# Patient Record
Sex: Female | Born: 1950 | Race: White | Hispanic: No | Marital: Married | Smoking: Never smoker
Health system: Southern US, Community
[De-identification: ages and names within clinical notes are randomized; demographics above are authoritative.]

## PROBLEM LIST (undated history)

## (undated) DIAGNOSIS — T8859XA Other complications of anesthesia, initial encounter: Secondary | ICD-10-CM

## (undated) DIAGNOSIS — R059 Cough, unspecified: Secondary | ICD-10-CM

## (undated) DIAGNOSIS — K219 Gastro-esophageal reflux disease without esophagitis: Secondary | ICD-10-CM

## (undated) DIAGNOSIS — Z923 Personal history of irradiation: Secondary | ICD-10-CM

## (undated) DIAGNOSIS — M8580 Other specified disorders of bone density and structure, unspecified site: Secondary | ICD-10-CM

## (undated) DIAGNOSIS — Z801 Family history of malignant neoplasm of trachea, bronchus and lung: Secondary | ICD-10-CM

## (undated) DIAGNOSIS — D509 Iron deficiency anemia, unspecified: Secondary | ICD-10-CM

## (undated) DIAGNOSIS — M40204 Unspecified kyphosis, thoracic region: Secondary | ICD-10-CM

## (undated) DIAGNOSIS — M1990 Unspecified osteoarthritis, unspecified site: Secondary | ICD-10-CM

## (undated) DIAGNOSIS — T7840XA Allergy, unspecified, initial encounter: Secondary | ICD-10-CM

## (undated) DIAGNOSIS — C541 Malignant neoplasm of endometrium: Secondary | ICD-10-CM

## (undated) DIAGNOSIS — Z8719 Personal history of other diseases of the digestive system: Secondary | ICD-10-CM

## (undated) DIAGNOSIS — R42 Dizziness and giddiness: Secondary | ICD-10-CM

## (undated) DIAGNOSIS — E785 Hyperlipidemia, unspecified: Secondary | ICD-10-CM

## (undated) DIAGNOSIS — I341 Nonrheumatic mitral (valve) prolapse: Secondary | ICD-10-CM

## (undated) DIAGNOSIS — R05 Cough: Secondary | ICD-10-CM

## (undated) DIAGNOSIS — I609 Nontraumatic subarachnoid hemorrhage, unspecified: Secondary | ICD-10-CM

## (undated) DIAGNOSIS — R011 Cardiac murmur, unspecified: Secondary | ICD-10-CM

## (undated) DIAGNOSIS — R5382 Chronic fatigue, unspecified: Secondary | ICD-10-CM

## (undated) DIAGNOSIS — I671 Cerebral aneurysm, nonruptured: Secondary | ICD-10-CM

## (undated) DIAGNOSIS — C50919 Malignant neoplasm of unspecified site of unspecified female breast: Secondary | ICD-10-CM

## (undated) DIAGNOSIS — Z803 Family history of malignant neoplasm of breast: Secondary | ICD-10-CM

## (undated) DIAGNOSIS — J45909 Unspecified asthma, uncomplicated: Secondary | ICD-10-CM

## (undated) DIAGNOSIS — D1800 Hemangioma unspecified site: Secondary | ICD-10-CM

## (undated) DIAGNOSIS — K589 Irritable bowel syndrome without diarrhea: Secondary | ICD-10-CM

## (undated) HISTORY — DX: Hemangioma unspecified site: D18.00

## (undated) HISTORY — DX: Allergy, unspecified, initial encounter: T78.40XA

## (undated) HISTORY — DX: Cough, unspecified: R05.9

## (undated) HISTORY — DX: Unspecified kyphosis, thoracic region: M40.204

## (undated) HISTORY — DX: Irritable bowel syndrome, unspecified: K58.9

## (undated) HISTORY — DX: Chronic fatigue, unspecified: R53.82

## (undated) HISTORY — DX: Gastro-esophageal reflux disease without esophagitis: K21.9

## (undated) HISTORY — DX: Cough: R05

## (undated) HISTORY — DX: Other specified disorders of bone density and structure, unspecified site: M85.80

## (undated) HISTORY — DX: Family history of malignant neoplasm of breast: Z80.3

## (undated) HISTORY — DX: Dizziness and giddiness: R42

## (undated) HISTORY — DX: Cerebral aneurysm, nonruptured: I67.1

## (undated) HISTORY — DX: Family history of malignant neoplasm of trachea, bronchus and lung: Z80.1

## (undated) HISTORY — DX: Nonrheumatic mitral (valve) prolapse: I34.1

## (undated) HISTORY — PX: COLONOSCOPY: SHX174

## (undated) HISTORY — PX: UPPER GASTROINTESTINAL ENDOSCOPY: SHX188

## (undated) HISTORY — DX: Malignant neoplasm of unspecified site of unspecified female breast: C50.919

## (undated) HISTORY — DX: Hyperlipidemia, unspecified: E78.5

## (undated) HISTORY — PX: TONSILLECTOMY: SUR1361

## (undated) HISTORY — PX: FOOT SURGERY: SHX648

## (undated) HISTORY — DX: Iron deficiency anemia, unspecified: D50.9

## (undated) HISTORY — PX: MOUTH SURGERY: SHX715

## (undated) HISTORY — DX: Unspecified asthma, uncomplicated: J45.909

## (undated) HISTORY — PX: TUBAL LIGATION: SHX77

## (undated) HISTORY — PX: APPENDECTOMY: SHX54

---

## 1997-11-25 ENCOUNTER — Ambulatory Visit (HOSPITAL_COMMUNITY): Admission: RE | Admit: 1997-11-25 | Discharge: 1997-11-25 | Payer: Self-pay | Admitting: Obstetrics and Gynecology

## 1997-11-30 ENCOUNTER — Ambulatory Visit (HOSPITAL_COMMUNITY): Admission: RE | Admit: 1997-11-30 | Discharge: 1997-11-30 | Payer: Self-pay | Admitting: Obstetrics and Gynecology

## 1997-12-08 ENCOUNTER — Encounter: Payer: Self-pay | Admitting: Gastroenterology

## 1997-12-09 ENCOUNTER — Encounter: Payer: Self-pay | Admitting: Gastroenterology

## 1998-05-05 ENCOUNTER — Encounter: Payer: Self-pay | Admitting: Gastroenterology

## 1998-05-05 ENCOUNTER — Other Ambulatory Visit: Admission: RE | Admit: 1998-05-05 | Discharge: 1998-05-05 | Payer: Self-pay | Admitting: Gastroenterology

## 1998-06-15 ENCOUNTER — Ambulatory Visit (HOSPITAL_COMMUNITY): Admission: RE | Admit: 1998-06-15 | Discharge: 1998-06-15 | Payer: Self-pay | Admitting: Obstetrics and Gynecology

## 1998-06-15 ENCOUNTER — Encounter: Payer: Self-pay | Admitting: Obstetrics and Gynecology

## 1998-11-24 ENCOUNTER — Ambulatory Visit (HOSPITAL_COMMUNITY): Admission: RE | Admit: 1998-11-24 | Discharge: 1998-11-24 | Payer: Self-pay | Admitting: Obstetrics and Gynecology

## 1998-11-29 ENCOUNTER — Other Ambulatory Visit: Admission: RE | Admit: 1998-11-29 | Discharge: 1998-11-29 | Payer: Self-pay | Admitting: Obstetrics and Gynecology

## 2000-03-19 ENCOUNTER — Other Ambulatory Visit: Admission: RE | Admit: 2000-03-19 | Discharge: 2000-03-19 | Payer: Self-pay | Admitting: Obstetrics and Gynecology

## 2000-07-16 ENCOUNTER — Encounter: Admission: RE | Admit: 2000-07-16 | Discharge: 2000-07-16 | Payer: Self-pay | Admitting: Obstetrics and Gynecology

## 2000-07-16 ENCOUNTER — Encounter: Payer: Self-pay | Admitting: Obstetrics and Gynecology

## 2001-05-13 ENCOUNTER — Other Ambulatory Visit: Admission: RE | Admit: 2001-05-13 | Discharge: 2001-05-13 | Payer: Self-pay | Admitting: Obstetrics and Gynecology

## 2001-06-18 HISTORY — PX: OTHER SURGICAL HISTORY: SHX169

## 2001-11-08 ENCOUNTER — Inpatient Hospital Stay (HOSPITAL_COMMUNITY): Admission: EM | Admit: 2001-11-08 | Discharge: 2001-11-21 | Payer: Self-pay | Admitting: Neurosurgery

## 2001-11-08 ENCOUNTER — Encounter: Payer: Self-pay | Admitting: Neurosurgery

## 2001-11-08 ENCOUNTER — Encounter: Payer: Self-pay | Admitting: Emergency Medicine

## 2001-11-09 ENCOUNTER — Encounter: Payer: Self-pay | Admitting: Neurosurgery

## 2001-11-09 ENCOUNTER — Encounter: Payer: Self-pay | Admitting: Pulmonary Disease

## 2001-11-18 ENCOUNTER — Encounter: Payer: Self-pay | Admitting: Neurosurgery

## 2001-11-19 ENCOUNTER — Encounter: Payer: Self-pay | Admitting: Neurosurgery

## 2002-02-12 ENCOUNTER — Ambulatory Visit (HOSPITAL_COMMUNITY): Admission: RE | Admit: 2002-02-12 | Discharge: 2002-02-12 | Payer: Self-pay | Admitting: Obstetrics and Gynecology

## 2002-02-12 ENCOUNTER — Encounter: Payer: Self-pay | Admitting: Obstetrics and Gynecology

## 2002-03-04 ENCOUNTER — Ambulatory Visit (HOSPITAL_COMMUNITY): Admission: RE | Admit: 2002-03-04 | Discharge: 2002-03-04 | Payer: Self-pay | Admitting: Interventional Radiology

## 2002-06-02 ENCOUNTER — Ambulatory Visit (HOSPITAL_COMMUNITY): Admission: RE | Admit: 2002-06-02 | Discharge: 2002-06-02 | Payer: Self-pay | Admitting: Interventional Radiology

## 2002-11-19 ENCOUNTER — Ambulatory Visit (HOSPITAL_COMMUNITY): Admission: RE | Admit: 2002-11-19 | Discharge: 2002-11-19 | Payer: Self-pay | Admitting: Interventional Radiology

## 2003-02-17 ENCOUNTER — Ambulatory Visit (HOSPITAL_COMMUNITY): Admission: RE | Admit: 2003-02-17 | Discharge: 2003-02-17 | Payer: Self-pay | Admitting: Obstetrics and Gynecology

## 2003-02-17 ENCOUNTER — Encounter: Payer: Self-pay | Admitting: Obstetrics and Gynecology

## 2003-06-02 ENCOUNTER — Ambulatory Visit (HOSPITAL_COMMUNITY): Admission: RE | Admit: 2003-06-02 | Discharge: 2003-06-02 | Payer: Self-pay | Admitting: Interventional Radiology

## 2004-03-14 ENCOUNTER — Ambulatory Visit (HOSPITAL_COMMUNITY): Admission: RE | Admit: 2004-03-14 | Discharge: 2004-03-14 | Payer: Self-pay | Admitting: Obstetrics and Gynecology

## 2004-05-18 ENCOUNTER — Ambulatory Visit: Payer: Self-pay | Admitting: Internal Medicine

## 2004-06-05 ENCOUNTER — Ambulatory Visit (HOSPITAL_COMMUNITY): Admission: RE | Admit: 2004-06-05 | Discharge: 2004-06-05 | Payer: Self-pay | Admitting: Interventional Radiology

## 2004-06-08 ENCOUNTER — Ambulatory Visit: Payer: Self-pay | Admitting: Internal Medicine

## 2004-08-29 ENCOUNTER — Ambulatory Visit: Payer: Self-pay | Admitting: Internal Medicine

## 2004-11-29 ENCOUNTER — Ambulatory Visit: Payer: Self-pay | Admitting: Internal Medicine

## 2004-11-30 ENCOUNTER — Ambulatory Visit: Payer: Self-pay | Admitting: Internal Medicine

## 2005-01-12 ENCOUNTER — Ambulatory Visit: Payer: Self-pay | Admitting: Internal Medicine

## 2005-05-15 ENCOUNTER — Ambulatory Visit (HOSPITAL_COMMUNITY): Admission: RE | Admit: 2005-05-15 | Discharge: 2005-05-15 | Payer: Self-pay | Admitting: Obstetrics and Gynecology

## 2005-05-23 ENCOUNTER — Ambulatory Visit: Payer: Self-pay | Admitting: Internal Medicine

## 2005-06-18 HISTORY — PX: BREAST EXCISIONAL BIOPSY: SUR124

## 2005-06-21 ENCOUNTER — Encounter (INDEPENDENT_AMBULATORY_CARE_PROVIDER_SITE_OTHER): Payer: Self-pay | Admitting: *Deleted

## 2005-06-21 ENCOUNTER — Encounter: Admission: RE | Admit: 2005-06-21 | Discharge: 2005-06-21 | Payer: Self-pay | Admitting: Obstetrics and Gynecology

## 2005-06-21 HISTORY — PX: BREAST BIOPSY: SHX20

## 2005-07-06 ENCOUNTER — Ambulatory Visit: Payer: Self-pay

## 2005-07-10 ENCOUNTER — Ambulatory Visit (HOSPITAL_COMMUNITY): Admission: RE | Admit: 2005-07-10 | Discharge: 2005-07-10 | Payer: Self-pay | Admitting: Interventional Radiology

## 2005-11-28 ENCOUNTER — Ambulatory Visit: Payer: Self-pay | Admitting: Internal Medicine

## 2005-12-18 ENCOUNTER — Encounter: Admission: RE | Admit: 2005-12-18 | Discharge: 2005-12-18 | Payer: Self-pay | Admitting: Obstetrics and Gynecology

## 2006-01-01 ENCOUNTER — Ambulatory Visit: Payer: Self-pay | Admitting: Internal Medicine

## 2006-02-22 ENCOUNTER — Encounter: Admission: RE | Admit: 2006-02-22 | Discharge: 2006-02-22 | Payer: Self-pay | Admitting: General Surgery

## 2006-02-22 ENCOUNTER — Encounter (INDEPENDENT_AMBULATORY_CARE_PROVIDER_SITE_OTHER): Payer: Self-pay | Admitting: Specialist

## 2006-02-22 ENCOUNTER — Ambulatory Visit (HOSPITAL_BASED_OUTPATIENT_CLINIC_OR_DEPARTMENT_OTHER): Admission: RE | Admit: 2006-02-22 | Discharge: 2006-02-22 | Payer: Self-pay | Admitting: General Surgery

## 2006-03-12 ENCOUNTER — Encounter: Payer: Self-pay | Admitting: Gastroenterology

## 2006-05-20 ENCOUNTER — Ambulatory Visit (HOSPITAL_COMMUNITY): Admission: RE | Admit: 2006-05-20 | Discharge: 2006-05-20 | Payer: Self-pay | Admitting: Obstetrics and Gynecology

## 2006-05-24 ENCOUNTER — Ambulatory Visit: Payer: Self-pay | Admitting: Internal Medicine

## 2006-05-24 LAB — CONVERTED CEMR LAB
ALT: 16 units/L (ref 0–40)
AST: 20 units/L (ref 0–37)
Albumin: 3.9 g/dL (ref 3.5–5.2)
Alkaline Phosphatase: 60 units/L (ref 39–117)
BUN: 11 mg/dL (ref 6–23)
Basophils Absolute: 0 10*3/uL (ref 0.0–0.1)
Basophils Relative: 0.8 % (ref 0.0–1.0)
Bilirubin Urine: NEGATIVE
CO2: 31 meq/L (ref 19–32)
Calcium: 9.2 mg/dL (ref 8.4–10.5)
Chloride: 103 meq/L (ref 96–112)
Chol/HDL Ratio, serum: 5.4
Cholesterol: 216 mg/dL (ref 0–200)
Creatinine, Ser: 0.8 mg/dL (ref 0.4–1.2)
Eosinophil percent: 1.3 % (ref 0.0–5.0)
GFR calc non Af Amer: 79 mL/min
Glomerular Filtration Rate, Af Am: 95 mL/min/{1.73_m2}
Glucose, Bld: 106 mg/dL — ABNORMAL HIGH (ref 70–99)
HCT: 37.8 % (ref 36.0–46.0)
HDL: 39.8 mg/dL (ref >39.0)
Hemoglobin, Urine: NEGATIVE
Hemoglobin: 13 g/dL (ref 12.0–15.0)
Ketones, ur: NEGATIVE mg/dL
LDL DIRECT: 167.1 mg/dL
Leukocytes, UA: NEGATIVE
Lymphocytes Relative: 27.8 % (ref 12.0–46.0)
MCHC: 34.4 g/dL (ref 30.0–36.0)
MCV: 85.6 fL (ref 78.0–100.0)
Monocytes Absolute: 0.6 10*3/uL (ref 0.2–0.7)
Monocytes Relative: 10.3 % (ref 3.0–11.0)
Neutro Abs: 3.2 10*3/uL (ref 1.4–7.7)
Neutrophils Relative %: 59.8 % (ref 43.0–77.0)
Nitrite: NEGATIVE
Platelets: 192 10*3/uL (ref 150–400)
Potassium: 3.7 meq/L (ref 3.5–5.1)
RBC: 4.41 M/uL (ref 3.87–5.11)
RDW: 12.6 % (ref 11.5–14.6)
Sodium: 141 meq/L (ref 135–145)
Specific Gravity, Urine: 1.025 (ref 1.000–1.03)
TSH: 1.47 microintl units/mL (ref 0.35–5.50)
Total Bilirubin: 1 mg/dL (ref 0.3–1.2)
Total Protein, Urine: NEGATIVE mg/dL
Total Protein: 6.8 g/dL (ref 6.0–8.3)
Triglyceride fasting, serum: 113 mg/dL (ref 0–149)
Urine Glucose: NEGATIVE mg/dL
Urobilinogen, UA: 0.2 (ref 0.0–1.0)
VLDL: 23 mg/dL (ref 0–40)
WBC: 5.4 10*3/uL (ref 4.5–10.5)
pH: 6 (ref 5.0–8.0)

## 2006-06-26 ENCOUNTER — Ambulatory Visit: Payer: Self-pay | Admitting: Internal Medicine

## 2006-07-17 ENCOUNTER — Ambulatory Visit (HOSPITAL_COMMUNITY): Admission: RE | Admit: 2006-07-17 | Discharge: 2006-07-17 | Payer: Self-pay | Admitting: Interventional Radiology

## 2007-05-02 DIAGNOSIS — K219 Gastro-esophageal reflux disease without esophagitis: Secondary | ICD-10-CM | POA: Insufficient documentation

## 2007-05-02 DIAGNOSIS — R5383 Other fatigue: Secondary | ICD-10-CM

## 2007-05-02 DIAGNOSIS — R05 Cough: Secondary | ICD-10-CM

## 2007-05-02 DIAGNOSIS — I609 Nontraumatic subarachnoid hemorrhage, unspecified: Secondary | ICD-10-CM | POA: Insufficient documentation

## 2007-05-02 DIAGNOSIS — M4120 Other idiopathic scoliosis, site unspecified: Secondary | ICD-10-CM | POA: Insufficient documentation

## 2007-05-02 DIAGNOSIS — R5381 Other malaise: Secondary | ICD-10-CM

## 2007-05-02 DIAGNOSIS — E785 Hyperlipidemia, unspecified: Secondary | ICD-10-CM | POA: Insufficient documentation

## 2007-05-02 DIAGNOSIS — M810 Age-related osteoporosis without current pathological fracture: Secondary | ICD-10-CM | POA: Insufficient documentation

## 2007-05-02 DIAGNOSIS — D1800 Hemangioma unspecified site: Secondary | ICD-10-CM | POA: Insufficient documentation

## 2007-05-02 DIAGNOSIS — K589 Irritable bowel syndrome without diarrhea: Secondary | ICD-10-CM | POA: Insufficient documentation

## 2007-05-22 ENCOUNTER — Encounter: Admission: RE | Admit: 2007-05-22 | Discharge: 2007-05-22 | Payer: Self-pay | Admitting: Obstetrics and Gynecology

## 2007-05-28 ENCOUNTER — Ambulatory Visit: Payer: Self-pay | Admitting: Internal Medicine

## 2007-05-30 DIAGNOSIS — R42 Dizziness and giddiness: Secondary | ICD-10-CM

## 2007-05-30 LAB — CONVERTED CEMR LAB
ALT: 17 units/L (ref 0–35)
AST: 20 units/L (ref 0–37)
Albumin: 3.9 g/dL (ref 3.5–5.2)
Alkaline Phosphatase: 51 units/L (ref 39–117)
BUN: 10 mg/dL (ref 6–23)
Bilirubin, Direct: 0.2 mg/dL (ref 0.0–0.3)
CO2: 32 meq/L (ref 19–32)
Calcium: 9.4 mg/dL (ref 8.4–10.5)
Chloride: 103 meq/L (ref 96–112)
Cholesterol: 204 mg/dL (ref 0–200)
Creatinine, Ser: 0.6 mg/dL (ref 0.4–1.2)
Direct LDL: 145.7 mg/dL
GFR calc Af Amer: 133 mL/min
GFR calc non Af Amer: 110 mL/min
Glucose, Bld: 95 mg/dL (ref 70–99)
HDL: 37 mg/dL — ABNORMAL LOW (ref >39.0)
Potassium: 3.8 meq/L (ref 3.5–5.1)
Sodium: 142 meq/L (ref 135–145)
TSH: 1.43 microintl units/mL (ref 0.35–5.50)
Total Bilirubin: 1.3 mg/dL — ABNORMAL HIGH (ref 0.3–1.2)
Total CHOL/HDL Ratio: 5.5
Total Protein: 6.8 g/dL (ref 6.0–8.3)
Triglycerides: 142 mg/dL (ref 0–149)
VLDL: 28 mg/dL (ref 0–40)

## 2007-06-03 LAB — CONVERTED CEMR LAB: Vit D, 1,25-Dihydroxy: 20 — ABNORMAL LOW (ref 30–89)

## 2007-07-09 ENCOUNTER — Ambulatory Visit: Payer: Self-pay | Admitting: Internal Medicine

## 2007-07-30 ENCOUNTER — Telehealth: Payer: Self-pay | Admitting: Internal Medicine

## 2007-08-25 ENCOUNTER — Encounter: Admission: RE | Admit: 2007-08-25 | Discharge: 2007-10-14 | Payer: Self-pay | Admitting: Internal Medicine

## 2007-08-25 ENCOUNTER — Encounter: Payer: Self-pay | Admitting: Internal Medicine

## 2008-05-27 ENCOUNTER — Encounter: Admission: RE | Admit: 2008-05-27 | Discharge: 2008-05-27 | Payer: Self-pay | Admitting: Obstetrics and Gynecology

## 2008-06-07 ENCOUNTER — Ambulatory Visit: Payer: Self-pay | Admitting: Internal Medicine

## 2008-06-08 LAB — CONVERTED CEMR LAB
ALT: 21 units/L (ref 0–35)
AST: 26 units/L (ref 0–37)
Albumin: 3.9 g/dL (ref 3.5–5.2)
Alkaline Phosphatase: 71 units/L (ref 39–117)
BUN: 11 mg/dL (ref 6–23)
Basophils Absolute: 0 10*3/uL (ref 0.0–0.1)
Basophils Relative: 0.5 % (ref 0.0–3.0)
Bilirubin, Direct: 0.2 mg/dL (ref 0.0–0.3)
CO2: 33 meq/L — ABNORMAL HIGH (ref 19–32)
CRP, High Sensitivity: 2 (ref 0.00–5.00)
Calcium: 9.3 mg/dL (ref 8.4–10.5)
Chloride: 103 meq/L (ref 96–112)
Cholesterol: 211 mg/dL (ref 0–200)
Creatinine, Ser: 0.7 mg/dL (ref 0.4–1.2)
Direct LDL: 146.5 mg/dL
Eosinophils Absolute: 0.1 10*3/uL (ref 0.0–0.7)
Eosinophils Relative: 1.3 % (ref 0.0–5.0)
GFR calc Af Amer: 111 mL/min
GFR calc non Af Amer: 91 mL/min
Glucose, Bld: 102 mg/dL — ABNORMAL HIGH (ref 70–99)
HCT: 35.1 % — ABNORMAL LOW (ref 36.0–46.0)
HDL: 40.8 mg/dL (ref >39.0)
Hemoglobin: 12.1 g/dL (ref 12.0–15.0)
Lymphocytes Relative: 26.5 % (ref 12.0–46.0)
MCHC: 34.3 g/dL (ref 30.0–36.0)
MCV: 83.9 fL (ref 78.0–100.0)
Monocytes Absolute: 0.5 10*3/uL (ref 0.1–1.0)
Monocytes Relative: 10.3 % (ref 3.0–12.0)
Neutro Abs: 3 10*3/uL (ref 1.4–7.7)
Neutrophils Relative %: 61.4 % (ref 43.0–77.0)
Platelets: 174 10*3/uL (ref 150–400)
Potassium: 3.8 meq/L (ref 3.5–5.1)
RBC: 4.19 M/uL (ref 3.87–5.11)
RDW: 12.4 % (ref 11.5–14.6)
Sodium: 140 meq/L (ref 135–145)
TSH: 1.47 microintl units/mL (ref 0.35–5.50)
Total Bilirubin: 1.1 mg/dL (ref 0.3–1.2)
Total CHOL/HDL Ratio: 5.2
Total Protein: 6.6 g/dL (ref 6.0–8.3)
Triglycerides: 127 mg/dL (ref 0–149)
VLDL: 25 mg/dL (ref 0–40)
Vit D, 1,25-Dihydroxy: 30 (ref 30–89)
WBC: 4.9 10*3/uL (ref 4.5–10.5)

## 2008-06-24 ENCOUNTER — Telehealth (INDEPENDENT_AMBULATORY_CARE_PROVIDER_SITE_OTHER): Payer: Self-pay | Admitting: *Deleted

## 2008-07-12 ENCOUNTER — Ambulatory Visit: Payer: Self-pay | Admitting: Internal Medicine

## 2008-07-12 ENCOUNTER — Encounter: Payer: Self-pay | Admitting: Internal Medicine

## 2008-07-14 ENCOUNTER — Telehealth (INDEPENDENT_AMBULATORY_CARE_PROVIDER_SITE_OTHER): Payer: Self-pay | Admitting: *Deleted

## 2008-08-23 ENCOUNTER — Ambulatory Visit: Payer: Self-pay | Admitting: Internal Medicine

## 2008-09-20 ENCOUNTER — Ambulatory Visit: Payer: Self-pay | Admitting: Gastroenterology

## 2008-10-08 ENCOUNTER — Ambulatory Visit: Payer: Self-pay | Admitting: Gastroenterology

## 2008-12-24 ENCOUNTER — Encounter: Payer: Self-pay | Admitting: Internal Medicine

## 2008-12-27 ENCOUNTER — Encounter: Payer: Self-pay | Admitting: Gastroenterology

## 2009-05-30 ENCOUNTER — Encounter: Admission: RE | Admit: 2009-05-30 | Discharge: 2009-05-30 | Payer: Self-pay | Admitting: Obstetrics and Gynecology

## 2009-06-07 ENCOUNTER — Encounter: Admission: RE | Admit: 2009-06-07 | Discharge: 2009-06-07 | Payer: Self-pay | Admitting: Obstetrics and Gynecology

## 2009-06-24 ENCOUNTER — Ambulatory Visit: Payer: Self-pay | Admitting: Internal Medicine

## 2009-06-24 DIAGNOSIS — I491 Atrial premature depolarization: Secondary | ICD-10-CM | POA: Insufficient documentation

## 2009-06-25 LAB — CONVERTED CEMR LAB
Albumin: 3.9 g/dL (ref 3.5–5.2)
Alkaline Phosphatase: 64 units/L (ref 39–117)
BUN: 10 mg/dL (ref 6–23)
Basophils Absolute: 0 10*3/uL (ref 0.0–0.1)
Bilirubin, Direct: 0.1 mg/dL (ref 0.0–0.3)
CO2: 30 meq/L (ref 19–32)
Calcium: 9.5 mg/dL (ref 8.4–10.5)
Cholesterol: 212 mg/dL — ABNORMAL HIGH (ref 0–200)
Creatinine, Ser: 0.7 mg/dL (ref 0.4–1.2)
Direct LDL: 147.4 mg/dL
Eosinophils Absolute: 0.1 10*3/uL (ref 0.0–0.7)
Glucose, Bld: 94 mg/dL (ref 70–99)
Hemoglobin, Urine: NEGATIVE
Lymphocytes Relative: 25.5 % (ref 12.0–46.0)
MCHC: 32.7 g/dL (ref 30.0–36.0)
Neutro Abs: 3.1 10*3/uL (ref 1.4–7.7)
Neutrophils Relative %: 61.1 % (ref 43.0–77.0)
Nitrite: NEGATIVE
Platelets: 179 10*3/uL (ref 150.0–400.0)
RDW: 12.9 % (ref 11.5–14.6)
Total Protein, Urine: NEGATIVE mg/dL
Triglycerides: 189 mg/dL — ABNORMAL HIGH (ref 0.0–149.0)

## 2009-07-07 ENCOUNTER — Ambulatory Visit: Payer: Self-pay

## 2009-07-07 ENCOUNTER — Encounter: Payer: Self-pay | Admitting: Cardiovascular Disease

## 2009-08-08 ENCOUNTER — Telehealth: Payer: Self-pay | Admitting: Internal Medicine

## 2009-08-08 ENCOUNTER — Ambulatory Visit: Payer: Self-pay | Admitting: Internal Medicine

## 2009-08-09 ENCOUNTER — Telehealth: Payer: Self-pay | Admitting: Gastroenterology

## 2009-08-10 ENCOUNTER — Encounter: Payer: Self-pay | Admitting: Internal Medicine

## 2009-10-26 ENCOUNTER — Ambulatory Visit: Payer: Self-pay | Admitting: Internal Medicine

## 2009-10-28 ENCOUNTER — Telehealth: Payer: Self-pay | Admitting: Internal Medicine

## 2009-10-28 LAB — CONVERTED CEMR LAB
Bilirubin Urine: NEGATIVE
Hemoglobin, Urine: NEGATIVE
Ketones, ur: NEGATIVE mg/dL
Urine Glucose: NEGATIVE mg/dL
Urobilinogen, UA: 0.2 (ref 0.0–1.0)

## 2009-11-17 ENCOUNTER — Encounter: Admission: RE | Admit: 2009-11-17 | Discharge: 2009-11-17 | Payer: Self-pay | Admitting: Obstetrics and Gynecology

## 2009-11-29 ENCOUNTER — Encounter: Payer: Self-pay | Admitting: Internal Medicine

## 2010-05-31 ENCOUNTER — Encounter
Admission: RE | Admit: 2010-05-31 | Discharge: 2010-05-31 | Payer: Self-pay | Source: Home / Self Care | Attending: Obstetrics and Gynecology | Admitting: Obstetrics and Gynecology

## 2010-06-22 ENCOUNTER — Encounter: Payer: Self-pay | Admitting: Internal Medicine

## 2010-07-04 ENCOUNTER — Encounter: Payer: Self-pay | Admitting: Internal Medicine

## 2010-07-04 ENCOUNTER — Other Ambulatory Visit: Payer: Self-pay | Admitting: Internal Medicine

## 2010-07-04 ENCOUNTER — Ambulatory Visit
Admission: RE | Admit: 2010-07-04 | Discharge: 2010-07-04 | Payer: Self-pay | Source: Home / Self Care | Attending: Internal Medicine | Admitting: Internal Medicine

## 2010-07-04 LAB — LIPID PANEL
Cholesterol: 202 mg/dL — ABNORMAL HIGH (ref 0–200)
HDL: 41.7 mg/dL (ref >39.00)
Total CHOL/HDL Ratio: 5
Triglycerides: 99 mg/dL (ref 0.0–149.0)
VLDL: 19.8 mg/dL (ref 0.0–40.0)

## 2010-07-04 LAB — BASIC METABOLIC PANEL
BUN: 19 mg/dL (ref 6–23)
CO2: 32 mEq/L (ref 19–32)
Calcium: 9.3 mg/dL (ref 8.4–10.5)
Chloride: 102 mEq/L (ref 96–112)
Creatinine, Ser: 0.7 mg/dL (ref 0.4–1.2)
GFR: 93.67 mL/min (ref >60.00)
Glucose, Bld: 99 mg/dL (ref 70–99)
Potassium: 4.3 mEq/L (ref 3.5–5.1)
Sodium: 141 mEq/L (ref 135–145)

## 2010-07-04 LAB — CBC WITH DIFFERENTIAL/PLATELET
Basophils Absolute: 0 10*3/uL (ref 0.0–0.1)
Basophils Relative: 0.6 % (ref 0.0–3.0)
Eosinophils Absolute: 0.1 10*3/uL (ref 0.0–0.7)
Eosinophils Relative: 1.9 % (ref 0.0–5.0)
HCT: 33.7 % — ABNORMAL LOW (ref 36.0–46.0)
Hemoglobin: 11.5 g/dL — ABNORMAL LOW (ref 12.0–15.0)
Lymphocytes Relative: 23 % (ref 12.0–46.0)
Lymphs Abs: 1.3 10*3/uL (ref 0.7–4.0)
MCHC: 34.1 g/dL (ref 30.0–36.0)
MCV: 83.5 fl (ref 78.0–100.0)
Monocytes Absolute: 0.5 10*3/uL (ref 0.1–1.0)
Monocytes Relative: 9.1 % (ref 3.0–12.0)
Neutro Abs: 3.8 10*3/uL (ref 1.4–7.7)
Neutrophils Relative %: 65.4 % (ref 43.0–77.0)
Platelets: 182 10*3/uL (ref 150.0–400.0)
RBC: 4.04 Mil/uL (ref 3.87–5.11)
RDW: 14.3 % (ref 11.5–14.6)
WBC: 5.9 10*3/uL (ref 4.5–10.5)

## 2010-07-04 LAB — RENAL FUNCTION PANEL
Albumin: 4 g/dL (ref 3.5–5.2)
BUN: 19 mg/dL (ref 6–23)
CO2: 32 mEq/L (ref 19–32)
Calcium: 9.3 mg/dL (ref 8.4–10.5)
Chloride: 102 mEq/L (ref 96–112)
Creatinine, Ser: 0.7 mg/dL (ref 0.4–1.2)
GFR: 93.67 mL/min (ref >60.00)
Glucose, Bld: 99 mg/dL (ref 70–99)
Phosphorus: 3.9 mg/dL (ref 2.3–4.6)
Potassium: 4.3 mEq/L (ref 3.5–5.1)
Sodium: 141 mEq/L (ref 135–145)

## 2010-07-04 LAB — URINALYSIS, ROUTINE W REFLEX MICROSCOPIC
Bilirubin Urine: NEGATIVE
Hemoglobin, Urine: NEGATIVE
Ketones, ur: NEGATIVE
Nitrite: NEGATIVE
Specific Gravity, Urine: 1.015 (ref 1.000–1.030)
Total Protein, Urine: NEGATIVE
Urine Glucose: NEGATIVE
Urobilinogen, UA: 0.2 (ref 0.0–1.0)
pH: 7 (ref 5.0–8.0)

## 2010-07-04 LAB — HEPATIC FUNCTION PANEL
ALT: 20 U/L (ref 0–35)
AST: 23 U/L (ref 0–37)
Albumin: 4 g/dL (ref 3.5–5.2)
Alkaline Phosphatase: 66 U/L (ref 39–117)
Bilirubin, Direct: 0.1 mg/dL (ref 0.0–0.3)
Total Bilirubin: 0.5 mg/dL (ref 0.3–1.2)
Total Protein: 6.7 g/dL (ref 6.0–8.3)

## 2010-07-04 LAB — HIGH SENSITIVITY CRP: CRP, High Sensitivity: 0.99 mg/L (ref 0.00–5.00)

## 2010-07-04 LAB — LDL CHOLESTEROL, DIRECT: Direct LDL: 151.4 mg/dL

## 2010-07-04 LAB — TSH: TSH: 2.08 u[IU]/mL (ref 0.35–5.50)

## 2010-07-09 ENCOUNTER — Encounter: Payer: Self-pay | Admitting: Obstetrics and Gynecology

## 2010-07-20 NOTE — Assessment & Plan Note (Signed)
Summary: Primary svc/ f/u ov   Copy to:  Alexis Lynn, MD Primary Provider/Referring Provider:  Sandrea Hughs, MD  CC:  6 wk followup.  Pt states no complaints today.Alexis [name]  History of Present Illness: 60  yowf  history of esophageal stricture requiring dilatation now complaining of chronic sensation of drainage in the back of her throat the chokes or when she lies down at night.  June 07, 2008 ov stating throat  worse x 5-6 months with mild dysphagia lower neck area no wt loss or overt hb symtoms. rec two times a day ppi and diet  August 23, 2008 ov not able to do  ppi two times a day and still with same dysphagia, noct hb especially severe on occasion, maybe twice per week, depending on what/ how late she eats. rec GI eval pos for HH rec Dexilant .    June 24, 2009 mulitple c/o's  3-4 months palpitations, occ with ex, no ex cp.  Freq throat irriation 2-3 years sometimes bothers her at night.   rec trial of pepcid 20 mg at bedtime and chlortrimeton and better  August 08, 2009 ov better pnds on present rx. no noct or am disturbance.  Pt denies any significant sore throat, dysphagia, itching, sneezing,  nasal congestion or excess secretions,  fever, chills, sweats, unintended wt loss, pleuritic or exertional cp, hempoptysis, change in activity tolerance  orthopnea pnd or leg swelling   Current Medications (verified): 1)  Pravachol 40 Mg  Tabs (Pravastatin Sodium) .... Take One Tab By Mouth At Bedtime 2)  Baby Aspirin 81 Mg Chew (Aspirin) .Alexis [name].. 1 Once Daily 3)  Kapidex 60 Mg Cpdr (Dexlansoprazole) .... One Tablet By Mouth Once Daily 4)  Pepcid Ac Maximum Strength 20 Mg Tabs (Famotidine) .... One At Bedtime 5)  Chlor-Trimeton 4 Mg Tabs (Chlorpheniramine Maleate) .... One At Bedtime 6)  Vitamin D 1000 Unit Tabs (Cholecalciferol) .Alexis [name].. 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) FATIGUE, CHRONIC (ICD-780.79) 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     - ABD [place] 11/30/04 HYPERLIPIDEMIA (ICD-272.4)  - Target < 160, single risk factor = pos fm hx COUGH (ICD-786.2) HEALTH MAINTENANCE...........................................................Alexis KitchenWert    - DT 6/07    - CPX June 24, 2009     - GYN care [name] Fe deficiency anemia, ferritin=4, 1999  Vital Signs:  Patient profile:   60 year old female Weight:      140 pounds O2 Sat:      97 % on Room air Temp:     98.5 degrees F oral Pulse rate:   57 / minute BP sitting:   126 / 74  (left arm)  Vitals Entered By: Vernie Murders (August 08, 2009 9:27 AM)  O2 Flow:  Room air  Physical Exam  Additional Exam:   Ambulatory healthy appearing in no acute distress. Afeb with normal vital signs  wt 140 > 135  06/07/08 > 139 07/26/08 > 138 June 24, 2009 >  140 August 09, 2009  HEENT: nl dentition, turbinates, and orophanx Neck without JVD/Nodes/TM Lungs clear to A and P bilaterally without cough on insp or exp maneuvers RRR no s3 or murmur or increase in P2 Abd soft and benign with nl excursion in the supine position. No bruits or organomegaly Ext warm without calf tenderness, cyanosis clubbing or edema     Impression & Recommendations:  Problem # 1:  COUGH (ICD-786.2) Assessment New  Upper airway cough syndrome, so named because it's frequently impossible to sort out how much is LPR vs CR/sinusitis with freq throat clearing generating secondary extra  esophageal GERD from wide swings in gastric pressure that occur with throat clearing, promoting self use of mint and menthol lozenges that reduce the lower esophageal sphincter tone and exacerbate the problem further.  These symptoms are easily confused with asthma/copd by even experienced pulmonogists because they overlap so much. These are the same pts who not infrequently have failed to tolerate ace inhibitors,  dry powder inhalers or biphosphonates or report having reflux symptoms that don't respond to standard doses of PPI  try reduce H1 and see if flares, if so consider allergy eval  Orders: Est. Patient Level III ([address]109)  Problem # 2:  HYPERLIPIDEMIA (ICD-272.4)  Her updated medication list for this problem includes:    Pravachol 40 Mg Tabs (Pravastatin sodium) .Alexis [name]... Take one tab by mouth at bedtime  Labs Reviewed: SGOT: 21 (06/24/2009)   SGPT: 22 (06/24/2009)   HDL:40.80 (06/24/2009), 40.8 (06/07/2008)  LDL:DEL (06/07/2008), DEL (05/28/2007)  Chol:212 (06/24/2009), 211 (06/07/2008)  Trig:189.0 (06/24/2009), 127 (06/07/2008)  Orders: Est. Patient Level III (60454)  Problem # 3:  OSTEOPENIA (ICD-733.90) Dexa's reviewed, ok to restart Calcium  See instructions for specific recommendations   Problem # 4:  GERD (ICD-530.81)  The following medications were removed from the medication list:    Kapidex 60 Mg Cpdr (Dexlansoprazole) ..... One tablet by mouth once daily Her updated medication list for this problem includes:    Pepcid Ac Maximum Strength 20 Mg Tabs (Famotidine) ..... One at bedtime    Protonix 40 Mg Tbec (Pantoprazole sodium) .Alexis [name]... Take 1 tablet by mouth every morning 30 minutes before breakfast meal.  F/u per Dr Russella Dar  Orders: Est. Patient Level III (09811)  Medications Added to Medication List This Visit: 1)  Vitamin D 1000 Unit Tabs (Cholecalciferol) .Alexis [name].. 1 once daily 2)  Dexilant 60 Mg Cpdr (Dexlansoprazole) .... Take  one 30-60 min before first meal of the  day  Patient Instructions: 1)  After 6 more weeks of chlortrimeton 4mg  at bedtime ok to take as needed  2)  Start back on TUMS ES after meals and if reflux or GI issues would recommend calcium citrate about 1500 mg per day 3)  Return to office in 3 months, sooner if needed  Prescriptions: DEXILANT 60 MG CPDR (DEXLANSOPRAZOLE) Take  one 30-60 min before first meal of the day  #34 x 11   Entered and Authorized by:   Nyoka Cowden MD   Signed by:   Nyoka Cowden MD on 08/08/2009   Method used:   [facility]        [facility] [phone]* (retail)       [address]       [place], [place]  [address]       Ph: [phone] or [phone]       Fax: [phone]   RxID:  [id number] PRAVACHOL 40 MG  TABS (PRAVASTATIN SODIUM) take one tab by mouth at bedtime  #34 x 90   Entered and Authorized by:   Nyoka Cowden MD   Signed by:   Nyoka Cowden MD on 08/08/2009   Method used:   [facility] to        [facility] [phone][facility] (retail)       [address]       [place], [place]  [address]       Ph: [phone] or [phone]       Fax: [phone]   RxID:   [id number]

## 2010-07-20 NOTE — Progress Notes (Signed)
Summary: RETURNING CALL TO [facility]  Phone Note Call from Patient Call back at Work Phone [phone]   Caller: Patient Call For: Zymeir Salminen Summary of Call: RETURNING CALL TO [facility] Initial call taken by: Lacinda Axon,  Oct 28, 2009 8:58 AM  Follow-up for Phone Call        pt advsied per append.  Follow-up by: Carron Curie CMA,  Oct 28, 2009 9:01 AM

## 2010-07-20 NOTE — Progress Notes (Signed)
Summary: Change PPI  Phone Note Outgoing Call   Call placed by: Hortense Ramal CMA Duncan Dull),  August 09, 2009 2:07 PM Call placed to: Patient Summary of Call: I have spoken to the patient. We got a prior authorization request from patient's pharmacy for her Dexilant. I spoke to her insurance company who will not cover the prescription any longer until she has tried zegerid, omeprazole, protonix, lansoprazole and nexium. Patient has tried nexium but none of the other medications. Patient would like a new prescription of protonix sent to [facility]. We will send that prescription and will [facility]. Initial call taken by: Hortense Ramal CMA Duncan Dull),  August 09, 2009 2:10 PM    New/Updated Medications: PROTONIX 40 MG TBEC (PANTOPRAZOLE SODIUM) Take 1 tablet by mouth every morning 30 minutes before breakfast meal. Prescriptions: PROTONIX 40 MG TBEC (PANTOPRAZOLE SODIUM) Take 1 tablet by mouth every morning 30 minutes before breakfast meal.  #90 x 0   Entered by:   Hortense Ramal CMA (AAMA)   Authorized by:   Meryl Dare MD [facility]   Signed by:   Hortense Ramal CMA (AAMA) on 08/09/2009   Method used:   Electronically to        [facility]* (mail-order)             ,          Ph: [phone]       Fax: [phone]   RxID:   [id number]

## 2010-07-20 NOTE — Letter (Signed)
Summary: Screening/[facility]  Screening/[employer]   Imported By: Lester [place] 12/15/2009 07:39:44  _____________________________________________________________________  External Attachment:    Type:   Image     Comment:   External Document

## 2010-07-20 NOTE — Progress Notes (Signed)
Summary: change pharmacy  Phone Note Call from Patient Call back at Home Phone [phone]   Caller: Patient Call For: Koriana Stepien Summary of Call: pt needs rx's called in to [facility].( note: pt was seen today).  Initial call taken by: Tivis Ringer, CNA,  August 08, 2009 11:03 AM  Follow-up for Phone Call        rx sent. pt aware.Carron Curie CMA  August 08, 2009 11:44 AM     Prescriptions: DEXILANT 60 MG CPDR (DEXLANSOPRAZOLE) Take  one 30-60 min before first meal of the day  #90 x 3   Entered by:   Carron Curie CMA   Authorized by:   Nyoka Cowden MD   Signed by:   Carron Curie CMA on 08/08/2009   Method used:   Faxed to ...       [facility]* (mail-order)             ,          Ph: [phone]       Fax: [phone]   RxID:   [id number] PRAVACHOL 40 MG  TABS (PRAVASTATIN SODIUM) take one tab by mouth at bedtime  #90 x 3   Entered by:   Carron Curie CMA   Authorized by:   Nyoka Cowden MD   Signed by:   Carron Curie CMA on 08/08/2009   Method used:   Faxed to ...       [facility]* (mail-order)             ,          Ph: [phone]       Fax: [phone]   RxID:   [id number]

## 2010-07-20 NOTE — Procedures (Signed)
Summary: Holter Report  Holter Report   Imported By: Marylou Mccoy 07/13/2009 08:47:51  _____________________________________________________________________  External Attachment:    Type:   Image     Comment:   External Document

## 2010-07-20 NOTE — Assessment & Plan Note (Signed)
Summary: Primary svc/ cpx   Copy to:  [name], MD Primary Provider/Referring Provider:  Sandrea Hughs, MD  CC:  cpx fasting.  History of Present Illness: 82 yowf  history of esophageal stricture requiring dilatation now complaining of chronic sensation of drainage in the back of her throat the chokes or when she lies down at night.  June 07, 2008 ov stating throat is worse x 5-6 months with mild dysphagia lower neck area no wt loss or overt hb symtoms. rec two times a day ppi and diet  August 23, 2008 ov not able to do  ppi two times a day and still with same dysphagia, noct hb especially severe on occasion, maybe twice per week, depending on what/ how late she eats. rec GI eval pos for HH rec Dexilant .    June 24, 2009 mulitple c/o's  3-4 months palpitations, occ with ex, no ex cp.  Freq throat irriation 2-3 years sometimes bothers her at night.  Pt denies any significant dysphagia, itching, sneezing,  nasal congestion or excess secretions,  fever, chills, sweats, unintended wt loss, pleuritic or exertional cp, hempoptysis, change in activity tolerance  orthopnea pnd or leg swelling.  Pt also denies any obvious fluctuation in symptoms with weather or environmental change or other alleviating or aggravating factors.       Current Medications (verified): 1)  Calcium 500 Mg  Tabs (Calcium) .... Take Two Tabs By Mouth Once Daily 2)  Pravachol 40 Mg  Tabs (Pravastatin Sodium) .... Take One Tab By Mouth At Bedtime 3)  Baby Aspirin 81 Mg Chew (Aspirin) .Marland Kitchen.. 1 Once Daily 4)  B Complete  Tabs (B Complex-Biotin-Fa) .Marland Kitchen.. 1 Once Daily 5)  Kapidex 60 Mg Cpdr (Dexlansoprazole) .... 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) FATIGUE, CHRONIC (ICD-780.79) 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     - ABD [place] 11/30/04 HYPERLIPIDEMIA (ICD-272.4)  - Target < 160, single risk factor = pos fm hx COUGH (ICD-786.2) HEALTH MAINTENANCE...........................................................Marland KitchenWert    - DT 6/07    - CPX June 24, 2009     - GYN care [name] Fe deficiency anemia, ferritin=4, 1999  Vital Signs:  Patient profile:   60 year old female Height:      65 inches Weight:      138 pounds BMI:     23.05 O2 Sat:      97 % on Room air Temp:     97.6 degrees F oral Pulse rate:   78 / minute BP sitting:   142 / 80  (left arm)  Vitals Entered By: Vernie Murders (June 24, 2009 9:10 AM)  O2 Flow:  Room air  Physical Exam  Additional Exam:   Ambulatory healthy appearing in no acute distress. Afeb with normal vital signs  wt 140 > 135  06/07/08 > 139 07/26/08 > 138 June 24, 2009  HEENT: nl dentition, turbinates, and orophanx Neck without JVD/Nodes/TM  Lungs clear to A and P bilaterally without cough on insp or exp maneuvers RRR no s3 or murmur or increase in P2 Abd soft and benign with nl excursion in the supine position. No bruits or organomegaly Ext warm without calf tenderness, cyanosis clubbing or edema    Cholesterol          [H]  212 mg/dL                   7-564     ATP III Classification            Desirable:  < 200 mg/dL                    Borderline High:  200 - 239 mg/dL               High:  > = 240 mg/dL   Triglycerides        [H]  189.0 mg/dL                 3.3-295.1     Normal:  <150 mg/dL     Borderline High:  884 - 199 mg/dL   HDL                       16.60 mg/dL                 >63.01   VLDL  Cholesterol          60.1 mg/dL                  0.9-32.3  CHO/HDL Ratio:  CHD Risk                             5                    Men          Women     1/2 Average Risk     3.4          3.3     Average Risk          5.0          4.4     2X Average Risk          9.6          7.1     3X Average Risk          15.0          11.0                           Tests: (2) BMP (METABOL)   Sodium                    141 mEq/L                   135-145   Potassium                 4.3 mEq/L                   3.5-5.1   Chloride                  105 mEq/L  96-112   Carbon Dioxide            30 mEq/L                    19-32   Glucose                   94 mg/dL                    98-11   BUN                       10 mg/dL                    9-14   Creatinine                0.7 mg/dL                   7.8-2.9   Calcium                   9.5 mg/dL                   5.6-21.3   GFR                       90.91 mL/min                >60  Tests: (3) CBC Platelet w/Diff (CBCD)   White Cell Count          5.1 K/uL                    4.5-10.5   Red Cell Count            4.27 Mil/uL                 3.87-5.11   Hemoglobin                12.0 g/dL                   08.6-57.8   Hematocrit                36.8 %                      36.0-46.0   MCV                       86.2 fl                     78.0-100.0   MCHC                      32.7 g/dL                   46.9-62.9   RDW                       12.9 %                      11.5-14.6   Platelet Count            179.0 K/uL                  150.0-400.0   Neutrophil %  61.1 %                      43.0-77.0   Lymphocyte %              25.5 %                      12.0-46.0   Monocyte %                11.6 %                      3.0-12.0   Eosinophils%              1.2 %                       0.0-5.0   Basophils %               0.6 %                       0.0-3.0   Neutrophill Absolute      3.1 K/uL                    1.4-7.7    Lymphocyte Absolute       1.3 K/uL                    0.7-4.0   Monocyte Absolute         0.6 K/uL                    0.1-1.0  Eosinophils, Absolute                             0.1 K/uL                    0.0-0.7   Basophils Absolute        0.0 K/uL                    0.0-0.1  Tests: (4) Hepatic/Liver Function Panel (HEPATIC)   Total Bilirubin           1.2 mg/dL                   1.1-9.1   Direct Bilirubin          0.1 mg/dL                   4.7-8.2   Alkaline Phosphatase      64 U/L                      39-117   AST                       21 U/L                      0-37   ALT                       22 U/L                      0-35   Total Protein             7.2  g/dL                    5.3-6.6   Albumin                   3.9 g/dL                    4.4-0.3  Tests: (5) TSH (TSH)   FastTSH                   1.64 uIU/mL                 0.35-5.50  Tests: (6) Full Range CRP (FCRP)   Full Range CRP            1.70 mg/L                   0.00-5.00   Cholesterol LDL - Direct                             147.4 mg/dL  CXR  Procedure date:  06/24/2009  Findings:      Comparison: Chest 11/28/2005.   Findings: Lungs are clear.  Heart size is normal.  No pleural effusion or focal bony abnormality.   IMPRESSION: No acute disease.  EKG  Procedure date:  06/24/2009  Findings:      nsr wnl  Impression & Recommendations:  Problem # 1:  PALPITATIONS (ICD-785.1)  Orders: EKG w/ Interpretation (93000) Misc. Referral (Misc. Ref) > 24 h holter  Problem # 2:  GERD (ICD-530.81) Upper airway cough syndrome, so named because it's frequently impossible to sort out how much is LPR vs CR/sinusitis with freq throat clearing generating secondary extra esophageal GERD from wide swings in gastric pressure that occur with throat clearing, promoting self use of mint and menthol lozenges that reduce the lower esophageal sphincter tone and exacerbate the problem further.  These symptoms are easily  confused with asthma/copd by even experienced pulmonogists because they overlap so much. These are the same pts who not infrequently have failed to tolerate ace inhibitors,  dry powder inhalers or biphosphonates or report having reflux symptoms that don't respond to standard doses of PPI.  See instructions for specific recommendations    The following medications were removed from the medication list:    Nexium 40 Mg Cpdr (Esomeprazole magnesium) .Marland Kitchen... Take 1 30 min before first meal of the day Her updated medication list for this problem includes:    Kapidex 60 Mg Cpdr (Dexlansoprazole) ..... One tablet by mouth once daily    Pepcid Ac Maximum Strength 20 Mg Tabs (Famotidine) ..... One at bedtime  Problem # 3:  OSTEOPENIA (ICD-733.90) DEXA reviewed, check vit D  Problem # 4:  HYPERLIPIDEMIA (ICD-272.4)  Target ldl < 160  so 147 @ target Her updated medication list for this problem includes:    Pravachol 40 Mg Tabs (Pravastatin sodium) .Marland Kitchen... Take one tab by mouth at bedtime  Labs Reviewed: SGOT: 26 (06/07/2008)   SGPT: 21 (06/07/2008)   HDL:40.8 (06/07/2008), 37.0 (05/28/2007)  LDL:DEL (06/07/2008), DEL (05/28/2007)  Chol:211 (06/07/2008), 204 (05/28/2007)  Trig:127 (06/07/2008), 142 (05/28/2007)  Medications Added to Medication List This Visit: 1)  Baby Aspirin 81 Mg Chew (Aspirin) .Marland Kitchen.. 1 once daily 2)  Pepcid Ac Maximum Strength 20 Mg Tabs (Famotidine) .... One at bedtime 3)  Chlor-trimeton 4 Mg Tabs (Chlorpheniramine maleate) .... One at bedtime  Other Orders:  TLB-Lipid Panel (80061-LIPID) TLB-BMP (Basic Metabolic Panel-BMET) (80048-METABOL) TLB-CBC Platelet - w/Differential (85025-CBCD) TLB-Hepatic/Liver Function Pnl (80076-HEPATIC) TLB-TSH (Thyroid Stimulating Hormone) (84443-TSH) TLB-CRP-High Sensitivity (C-Reactive Protein) (86140-FCRP) TLB-Udip ONLY (81003-UDIP) T-Vitamin D (25-Hydroxy) (16[address]-60454) T-2 View CXR (71020TC) Est. Patient 40-64 years (09811)  Patient  Instructions: 1)  Add pepcid 20 mg and chlortimeton at bedtime 2)  GERD (REFLUX)  is a common cause of respiratory symptoms. It commonly presents without heartburn and can be treated with medication, but also with lifestyle changes including avoidance of late meals, excessive alcohol, smoking cessation, and avoid fatty foods, chocolate, peppermint, colas, red wine, and acidic juices such as orange juice. NO MINT OR MENTHOL PRODUCTS SO NO COUGH DROPS  3)  USE SUGARLESS CANDY INSTEAD (jolley ranchers)  4)  NO OIL BASED VITAMINS  5)  Call [phone] for your results w/in next 3 days - if there's something important  I feel you need to know,  I'll be in touch with you directly.  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 GERD (ICD-530.81) IRRITABLE BOWEL SYNDROME (ICD-564.1) OSTEOPENIA (ICD-733.90)     - DEXA  07/04/06  Tspine .03, Left hip -.94,  L fem Neck -1.4     - DEXA  07/12/08 Tspine   -.2  Left F -.7        Right Fem -.4 KYPHOSCOLIOSIS, 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) FATIGUE, CHRONIC (ICD-780.79) 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     - ABD [place] 11/30/04 HYPERLIPIDEMIA (ICD-272.4)  - Target < 160, single risk factor = pos fm hx COUGH (ICD-786.2) HEALTH MAINTENANCE...........................................................Marland KitchenWert    - DT 6/07    - colonoscopy letter sent 03/13/05    - CPX 06/07/08    - GYN care [name] Fe deficiency anemia, ferritin=4, 1999  Recorded: 06/24/2009 09:21 AM P/PR:  105 ms / 155 ms - Heart rate (maximum exercise) QRS: 85 QT/QTc/QTd: 422 ms / 418 ms / 58 ms - Heart rate (maximum exercise)  P/QRS/T axis: 65 deg / 35 deg / 71 deg - Heart rate (maximum exercise)  Heartrate: 58 bpm  Interpretation:  nsr wnl

## 2010-07-20 NOTE — Medication Information (Signed)
Summary: [facility]  [facility]   Imported By: Lester [place] 08/12/2009 10:12:58  _____________________________________________________________________  External Attachment:    Type:   Image     Comment:   External Document

## 2010-07-20 NOTE — Assessment & Plan Note (Signed)
Summary: Primary svc/ ext f/u mulitiple issues ? tic fever   Copy to:  [name], MD Primary Provider/Referring Provider:  Sandrea Hughs, MD  CC:  3 month followup.  Pt states that she was bit by a tick on her right leg a few days ago and since then she has had chills and lower abdominal pain to radiates to lower back.  .  History of Present Illness: 9  yowf  history of esophageal stricture requiring dilatation now complaining of chronic sensation of drainage in the back of her throat the chokes or when she lies down at night.  June 07, 2008 ov stating throat  worse x 5-6 months with mild dysphagia lower neck area no wt loss or overt hb symtoms. rec two times a day ppi and diet  August 23, 2008 ov not able to do  ppi two times a day and still with same dysphagia, noct hb especially severe on occasion, maybe twice per week, depending on what/ how late she eats. rec GI eval pos for HH rec Dexilant .    June 24, 2009 mulitple c/o's  3-4 months palpitations, occ with ex, no ex cp.  Freq throat irriation 2-3 years sometimes bothers her at night.   rec trial of pepcid 20 mg at bedtime and chlortrimeton and better  August 08, 2009 ov better pnds on present rx. no noct or am disturbance.  rec After 6 more weeks of chlortrimeton 4mg  at bedtime ok to take as needed  Start back on TUMS ES after meals and if reflux or GI issues would recommend calcium citrate about 1500 mg per day > cough completely resolved.  Oct 26, 2009 cc  bit by a tick on her right leg a few days ago and since then she has had chills and lower abdominal pain to radiates to lower back but has migrated from l to right since onset   No fever st or ha. Pt denies any significant sore throat, dysphagia, itching, sneezing,  nasal congestion or excess secretions,  fever, chills, sweats, unintended wt loss, pleuritic or exertional cp, hempoptysis, change in activity tolerance  orthopnea pnd or leg swelling.  Pt also denies any  obvious fluctuation in symptoms with weather or environmental change or other alleviating or aggravating factors.       Current Medications (verified): 1)  Pravachol 40 Mg  Tabs (Pravastatin Sodium) .... Take One Tab By Mouth At Bedtime 2)  Baby Aspirin 81 Mg Chew (Aspirin) .Marland Kitchen.. 1 Once Daily 3)  Pepcid Ac Maximum Strength 20 Mg Tabs (Famotidine) .... One At Bedtime 4)  Chlor-Trimeton 4 Mg Tabs (Chlorpheniramine Maleate) .... One At Bedtime 5)  Vitamin D 1000 Unit Tabs (Cholecalciferol) .Marland Kitchen.. 1 Once Daily 6)  Protonix 40 Mg Tbec (Pantoprazole Sodium) .... Take 1 Tablet By Mouth Every Morning 30 Minutes Before Breakfast Meal.  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     - ABD [place] 11/30/04  HYPERLIPIDEMIA (ICD-272.4)  - Target < 160, single risk factor = pos fm hx (brother, smoker) COUGH (ICD-786.2) HEALTH MAINTENANCE...........................................................Marland KitchenWert    - DT 6/07    - CPX June 24, 2009     - GYN care [name] Fe deficiency anemia, ferritin=4, 1999  Vital Signs:  Patient profile:   60 year old female Weight:      126 pounds O2 Sat:      98 % on Room air Temp:     97.2 degrees F oral Pulse rate:   65 / minute BP sitting:   120 / 78  (left arm)  Vitals Entered By: Vernie Murders (Oct 26, 2009 4:29 PM)  O2 Flow:  Room air  Physical Exam  Additional Exam:   Ambulatory  anxious wf  in no  acute distress. Afeb with normal vital signs  wt 140 > 135  06/07/08 > 139 07/26/08 > 138 June 24, 2009 > 140 August 09, 2009 > 126 Oct 26, 2009  HEENT: nl dentition, turbinates, and orophanx Neck without JVD/Nodes/TM Lungs clear to A and P bilaterally without cough on insp or exp maneuvers RRR no s3 or murmur or increase in P2 Abd soft and benign with nl excursion in the supine position. No bruits or organomegaly Ext warm without calf tenderness, cyanosis clubbing or edema Skin nl, no rash     Impression & Recommendations:  Problem # 1:  FATIGUE, CHRONIC (ICD-780.79)  no evidence of ricketsial infection but pt given rx for doxy and instructions  Orders: Est. Patient Level IV (04540)  Problem # 2:  HYPERLIPIDEMIA (ICD-272.4)  Brother now dead from ? cva or ihd but smoked Her updated medication list for this problem includes:    Pravachol 40 Mg Tabs (Pravastatin sodium) .Marland Kitchen... Take one tab by mouth at bedtime > LDL 147 but ideally should be < 130  Labs Reviewed: SGOT: 21 (06/24/2009)   SGPT: 22 (06/24/2009)   HDL:40.80 (06/24/2009), 40.8 (06/07/2008)  LDL:DEL (06/07/2008), DEL (05/28/2007)  Chol:212 (06/24/2009), 211 (06/07/2008)  Trig:189.0 (06/24/2009), 127 (06/07/2008)  Orders: Est. Patient Level IV (98119)  Problem # 3:  IRRITABLE BOWEL SYNDROME (ICD-564.1)  Orders: TLB-Udip ONLY (81003-UDIP) Est. Patient Level IV (14782) > neg  migratory nature of pain c/w flare of ibs due to stress. no change in recs  Each maintenance medication was reviewed in detail including most importantly the difference between maintenance prns and under what circumstances the prns are to be used.  In addition, these two groups (for which the patient should keep up with refills) were distinguished from a third group :  meds that are used only short term with the intent to complete a course of therapy and then not refill them.  The med list was then fully reconciled and reorganized to reflect  this important distinction.   Problem # 4:  COUGH (ICD-786.2)  Classic Upper airway cough syndrome, so named because it's frequently impossible to sort out how much is  CR/sinusitis with freq throat clearing (which can be related to primary GERD)   vs  causing  secondary extra esophageal GERD from wide swings in gastric pressure that occur with throat clearing, promoting self use of mint and menthol lozenges that reduce the lower esophageal sphincter tone and exacerbate the problem further.  These are the same pts who not infrequently have failed to tolerate ace inhibitors,  dry powder inhalers or biphosphonates or report having reflux symptoms that don't respond to standard doses of PPI   Continue  present rx as cough has resolved as well as sensation of drainage.   Medications Added to Medication List This Visit: 1)  Doxycycline Hyclate 100 Mg Tabs (Doxycycline hyclate) .... One twice daily  Patient Instructions: 1)  Return for CPX  after June 24 2010 2)  Drink plenty of fluids 3)  if condition worsens (fever, ha, rash) go ahead and take doxy x7 days  Prescriptions: DOXYCYCLINE HYCLATE 100 MG TABS (DOXYCYCLINE HYCLATE) one twice daily  #14 x 0   Entered and Authorized by:   Nyoka Cowden MD   Signed by:   Nyoka Cowden MD on 10/26/2009   Method used:   Print then Give to Patient   RxID:   [id number]

## 2010-07-20 NOTE — Assessment & Plan Note (Signed)
Summary: Primary svc/ cpx   Copy to:  [name], MD Primary Provider/Referring Provider:  Sandrea Hughs, MD  CC:  cpx fasting.  History of Present Illness: 59 yowf  history of esophageal stricture requiring dilatation now complaining of chronic sensation of drainage in the back of her throat the chokes or when she lies down at night.  June 07, 2008 ov stating throat  worse x 5-6 months with mild dysphagia lower neck area no wt loss or overt hb symtoms. rec two times a day ppi and diet  August 23, 2008 ov not able to do  ppi two times a day and still with same dysphagia, noct hb especially severe on occasion, maybe twice per week, depending on what/ how late she eats. rec GI eval pos for HH rec Dexilant .    June 24, 2009 mulitple c/o's  3-4 months palpitations, occ with ex, no ex cp.  Freq throat irriation 2-3 years sometimes bothers her at night.   rec trial of pepcid 20 mg at bedtime and chlortrimeton and better  August 08, 2009 ov better pnds on present rx. no noct or am disturbance.  rec After 6 more weeks of chlortrimeton 4mg  at bedtime ok to take as needed  Start back on TUMS ES after meals and if reflux or GI issues would recommend calcium citrate about 1500 mg per day > cough completely resolved.  July 04, 2010 cpx cough resolved. no tia or claudication. Pt denies any significant sore throat, dysphagia, itching, sneezing,  nasal congestion or excess secretions,  fever, chills, sweats, unintended wt loss, pleuritic or exertional cp, hempoptysis, change in activity tolerance  orthopnea pnd or leg swelling         Current Medications (verified): 1)  Pravachol 40 Mg  Tabs (Pravastatin Sodium) .... Take One Tab By Mouth At Bedtime 2)  Baby Aspirin 81 Mg Chew (Aspirin) .Marland Kitchen.. 1 Once Daily 3)  Pepcid Ac Maximum Strength 20 Mg Tabs (Famotidine) .... One At Bedtime 4)  Chlor-Trimeton 4 Mg Tabs (Chlorpheniramine Maleate) .... One At Bedtime 5)  Vitamin D 1000 Unit Tabs  (Cholecalciferol) .Marland Kitchen.. 1 Once Daily 6)  Nexium 40 Mg Cpdr (Esomeprazole Magnesium) .Marland Kitchen.. 1 Once Daily 7)  B Complex  Tabs (B Complex Vitamins) .Marland Kitchen.. 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 IRRITABLE BOWEL SYNDROME (ICD-564.1) OSTEOPENIA (ICD-733.90)     - DEXA  07/04/06  Tspine .03, Left hip -.94,  L fem Neck -1.4     - DEXA  07/12/08 Tspine   -.2  Left F -.7        Right Fem -.4     - DEXA REC p 08/12/10>>> KYPHOSCOLIOSIS, 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 Patient never smoked.  Alcohol Use - no Occupation: [employer] Illicit Drug Use - no Daily Caffeine Use   4-5 cups per day  Vital Signs:  Patient profile:   60 year old female Height:      64.5 inches Weight:      122 pounds BMI:     20.69 O2 Sat:      99 % on Room air Temp:     97.4 degrees F oral BP sitting:   116 / 76  (left arm)  Vitals Entered By: Vernie Murders (July 04, 2010 8:43 AM)  O2 Flow:  Room air  Physical Exam  Additional Exam:  Ambulatory  anxious wf  in no acute distress.  wt 140 > 135   06/07/08 > 139 07/26/08 > 138 June 24, 2009 > 140 August 09, 2009 > 126 Oct 26, 2009 > 122 July 04, 2010  HEENT: nl dentition, turbinates, and orophanx Neck without JVD/Nodes/TM Lungs clear to A and P bilaterally without cough on insp or exp maneuvers RRR no s3 or murmur or increase in P2 Abd soft and benign with nl excursion in the supine position. No bruits or organomegaly Ext warm without calf tenderness, cyanosis clubbing or edema Skin nl, no rash   Cholesterol          [H]  202 mg/dL                   1-478     ATP III Classification            Desirable:  < 200 mg/dL                    Borderline High:  200 - 239 mg/dL               High:  > = 240 mg/dL   Triglycerides             99.0 mg/dL                  2.9-562.1     Normal:  <150 mg/dL     Borderline High:  308 - 199 mg/dL   HDL                       65.78 mg/dL                 >46.96   VLDL Cholesterol          19.8 mg/dL                  2.9-52.8  CHO/HDL Ratio:  CHD Risk                             5                    Men          Women     1/2 Average Risk     3.4          3.3     Average Risk          5.0          4.4     2X Average Risk          9.6          7.1     3X Average Risk  15.0          11.0                           Tests: (2) Renal Function Panel (RENAL)   Sodium                    141 mEq/L                   135-145   Potassium                 4.3 mEq/L                   3.5-5.1   Chloride                  102 mEq/L                   96-112   Carbon Dioxide            32 mEq/L                    19-32   Calcium                   9.3 mg/dL                   1.6-10.9   Albumin                   4.0 g/dL                    6.0-4.5   BUN                       19 mg/dL                    4-09   Creatinine                0.7 mg/dL                   8.1-1.9   Glucose                   99 mg/dL                    14-78   Phosphorus                3.9 mg/dL                   2.9-5.6   GFR                        93.67 mL/min                >60.00  Tests: (3) BMP (METABOL)   Sodium                    141 mEq/L                   135-145   Potassium                 4.3 mEq/L                   3.5-5.1   Chloride  102 mEq/L                   96-112   Carbon Dioxide            32 mEq/L                    19-32   Glucose                   99 mg/dL                    44-01   BUN                       19 mg/dL                    0-27   Creatinine                0.7 mg/dL                   2.5-3.6   Calcium                   9.3 mg/dL                   6.4-40.3   GFR                       93.67 mL/min                >60.00  Tests: (4) CBC Platelet w/Diff (CBCD)   White Cell Count          5.9 K/uL                    4.5-10.5   Red Cell Count            4.04 Mil/uL                 3.87-5.11   Hemoglobin           [L]  11.5 g/dL                   47.4-25.9   Hematocrit           [L]  33.7 %                      36.0-46.0   MCV                       83.5 fl                     78.0-100.0   MCHC                      34.1 g/dL                   56.3-87.5   RDW                       14.3 %                      11.5-14.6   Platelet Count            182.0 K/uL  150.0-400.0   Neutrophil %              65.4 %                      43.0-77.0   Lymphocyte %              23.0 %                      12.0-46.0   Monocyte %                9.1 %                       3.0-12.0   Eosinophils%              1.9 %                       0.0-5.0   Basophils %               0.6 %                       0.0-3.0   Neutrophill Absolute      3.8 K/uL                    1.4-7.7   Lymphocyte Absolute       1.3 K/uL                    0.7-4.0   Monocyte Absolute         0.5 K/uL                    0.1-1.0  Eosinophils, Absolute                             0.1 K/uL                    0.0-0.7   Basophils Absolute        0.0 K/uL                    0.0-0.1  Tests: (5) Hepatic/Liver  Function Panel (HEPATIC)   Total Bilirubin           0.5 mg/dL                   0.4-5.4   Direct Bilirubin          0.1 mg/dL                   0.9-8.1   Alkaline Phosphatase      66 U/L                      39-117   AST                       23 U/L                      0-37   ALT                       20 U/L  0-35   Total Protein             6.7 g/dL                    1.6-1.0   Albumin                   4.0 g/dL                    9.6-0.4  Tests: (6) TSH (TSH)   FastTSH                   2.08 uIU/mL                 0.35-5.50  Tests: (7) Full Range CRP (FCRP)   CRPH                      0.99 mg/L                   0.00-5.00     Note:  An elevated hs-CRP (>5 mg/L) should be repeated after 2 weeks to rule out recent infection or trauma.  Tests: (8) UDip w/Micro (URINE)   Color                     LT. YELLOW       RANGE:  Yellow;Lt. Yellow   Clarity                   CLEAR                       Clear   Specific Gravity          1.015                       1.000 - 1.030   Urine Ph                  7.0                         5.0-8.0   Protein                   NEGATIVE                    Negative   Urine Glucose             NEGATIVE                    Negative   Ketones                   NEGATIVE                    Negative   Urine Bilirubin           NEGATIVE                    Negative   Blood                     NEGATIVE                    Negative   Urobilinogen              0.2  0.0 - 1.0   Leukocyte Esterace        TRACE                       Negative   Nitrite                   NEGATIVE                    Negative   Urine WBC                 0-2/hpf                     0-2/hpf   Urine RBC                 0-2/hpf                     0-2/hpf   Urine Epith               Rare(0-4/hpf)               Rare(0-4/hpf)  Tests: (9) Cholesterol LDL - Direct (DIRLDL)  Cholesterol LDL - Direct                             151.4 mg/dL   Vitamin D  (60-[id number])                             43 ng/mL                    30-89  Impression & Recommendations:  Problem # 1:  HYPERLIPIDEMIA (ICD-272.4) Assessment New target < 160 Her updated medication list for this problem includes:    Pravachol 40 Mg Tabs (Pravastatin sodium) .Marland Kitchen... Take one tab by mouth at bedtime  Labs Reviewed: SGOT: 21 (06/24/2009)   SGPT: 22 (06/24/2009)   HDL:40.80 (06/24/2009), 40.8 (06/07/2008)  LDL:DEL (06/07/2008), DEL (05/28/2007)  Chol:212 (06/24/2009), 211 (06/07/2008)  Trig:189.0 (06/24/2009), 127 (06/07/2008)  LDL 147 > 151 July 04, 1912 on rx, no change needed  Problem # 2:  OSTEOPENIA (ICD-733.90) Needs repeat dexa, vit d and calcium reviewed .. . See instructions for specific recommendations   Problem # 3:  PALPITATIONS (ICD-785.1) dx with MVP by Bruce who rec rx with abx for sbe prophylaxis.   Problem # 4:  GERD (ICD-530.81) ok on present rx/ egd results reviewed  Her updated medication list for this problem includes:    Pepcid Ac Maximum Strength 20 Mg Tabs (Famotidine) ..... One at bedtime    Nexium 40 Mg Cpdr (Esomeprazole magnesium) .Marland Kitchen... 1 once daily  Medications Added to Medication List This Visit: 1)  Nexium 40 Mg Cpdr (Esomeprazole magnesium) .Marland Kitchen.. 1 once daily 2)  B Complex Tabs (B complex vitamins) .Marland Kitchen.. 1 once daily  Other Orders: EKG w/ Interpretation (93000) T-Vitamin D (25-Hydroxy) (78295-62130) Est. Patient 40-64 years (86578) Misc. Referral (Misc. Ref) TLB-Lipid Panel (80061-LIPID) TLB-Renal Function Panel (80069-RENAL) TLB-BMP (Basic Metabolic Panel-BMET) (80048-METABOL) TLB-CBC Platelet - w/Differential (85025-CBCD) TLB-Hepatic/Liver Function Pnl (80076-HEPATIC) TLB-TSH (Thyroid Stimulating Hormone) (84443-TSH) TLB-CRP-High Sensitivity (C-Reactive Protein) (86140-FCRP) TLB-Udip ONLY (81003-UDIP)  Patient Instructions: 1)  Calcium 1500 mg per day but calcium  citrate, not carbonate 2)  Vit D 1000  units per day 3)   See Patient Care Coordinator before leaving for dexa  4)  Pt notified by phone tree, ok results, no changes in recommendations.

## 2010-08-09 NOTE — Letter (Signed)
Summary: Amie Critchley DDS  Amie Critchley DDS   Imported By: Sherian Rein 07/31/2010 09:52:28  _____________________________________________________________________  External Attachment:    Type:   Image     Comment:   External Document

## 2010-08-14 ENCOUNTER — Encounter: Payer: Self-pay | Admitting: Internal Medicine

## 2010-08-15 ENCOUNTER — Ambulatory Visit (INDEPENDENT_AMBULATORY_CARE_PROVIDER_SITE_OTHER)
Admission: RE | Admit: 2010-08-15 | Discharge: 2010-08-15 | Disposition: A | Discharge status: Home / Self Care | Payer: Self-pay | Source: Ambulatory Visit | Attending: Internal Medicine | Admitting: Internal Medicine

## 2010-08-15 ENCOUNTER — Other Ambulatory Visit: Payer: Self-pay | Admitting: Internal Medicine

## 2010-08-15 ENCOUNTER — Encounter: Payer: Self-pay | Admitting: Internal Medicine

## 2010-08-15 ENCOUNTER — Other Ambulatory Visit: Payer: Self-pay

## 2010-08-15 DIAGNOSIS — M899 Disorder of bone, unspecified: Secondary | ICD-10-CM

## 2010-08-15 DIAGNOSIS — M949 Disorder of cartilage, unspecified: Secondary | ICD-10-CM

## 2010-08-24 NOTE — Miscellaneous (Signed)
Summary: Orders Update  Clinical Lists Changes  Orders: Added new Test order of T-Bone Densitometry (77080) - Signed Added new Test order of T-Lumbar Vertebral Assessment (77082) - Signed 

## 2010-11-01 ENCOUNTER — Other Ambulatory Visit: Payer: Self-pay | Admitting: *Deleted

## 2010-11-01 MED ORDER — ESOMEPRAZOLE MAGNESIUM 40 MG PO CPDR: 40.0000 mg | DELAYED_RELEASE_CAPSULE | Freq: Every day | ORAL | Status: DC

## 2010-11-01 MED ORDER — PRAVASTATIN SODIUM 40 MG PO TABS: 40.0000 mg | ORAL_TABLET | Freq: Every evening | ORAL | Status: DC

## 2010-11-03 NOTE — H&P (Signed)
[place]  Patient:    Alexis [name], Alexis [name] Visit Number: [id number] MRN: [id number]          Service Type: SUR Location: [id number] Attending Physician:  Danella Penton Dictated by:   Tanya Nones. Jeral Fruit, M.D. Admit Date:  11/08/2001                           History and Physical  AGE:  60.  HISTORY OF PRESENT ILLNESS:  Alexis [name] is a 60 year old female who at home suddenly developed the onset of bifrontal headache.  According to the husband, she experienced a headache like something snapped completely in the brain. The patient was taken to [facility] where in the emergency room, emergency CT scan was done which showed subarachnoid hemorrhage and transferred by ambulance to [facility].  By the time I saw her in the emergency room, she was only answering with a yes and no.  She was complaining of headache.  Without stimulation, she had a tendency to fall asleep.  PAST MEDICAL HISTORY:  Past medical history according to the husband is negative.  The only history is she has had migraine headaches.  FAMILY HISTORY:  Father died of an abdominal aneurysm.  MEDICATIONS:  She is not taking any medications.  SOCIAL HISTORY:  Negative.  PHYSICAL EXAMINATION:  VITAL SIGNS:  Blood pressure 130/70, pulse of 82.  HEENT:  Normal.  NECK:  She has a stiff neck, grade 2/4.  There are no bruits in the carotids.  LUNGS:  There are some mild rhonchi.  HEART:  Heart sounds normal.  ABDOMEN:  Normal.  EXTREMITIES:  Normal pulses.  NEUROLOGIC:  The patient only answers with yes and no, only if the husband asks the questions.  The pupils are 2 mm and is because she is quite a bit of uncooperative and it is difficult to do a funduscopy.  It seems that she has full ocular movement.  The face appears to be symmetrical.  Strength:  She moves all four extremities and reflexes are like 2+ with a questionable Babinski on the left  side.  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 Although the hemorrhage is diffuse, it seems that the point or source is in the right side.  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  RECOMMENDATION:  I talked to the husband at length.  We are going to bring the patient to the x-ray department.  We are going to intubate prior to that, we are going to paralyze, put a Foley catheter.  The cerebral angiogram will be done by the interventional radiologist.  The husband knows that if the aneurysm is sunken then it can coil, then there will be no need for surgery; if that is not the case, the patient will be taken to surgery today or tomorrow.  He knows that we have a window of opportunity of at least 48 hours before we take her to surgery.  The natural history of the disease was fully explained to the husband.  I talked to him about hydrocephalus, stroke, vasospasm, no improvement whatsoever, paralysis and death. Dictated by:   Tanya Nones. Jeral Fruit, M.D. Attending Physician:  Danella Penton DD:  11/08/01 TD:  11/10/01 Job: [phone] [id number] UXL/KG401

## 2010-11-03 NOTE — Op Note (Signed)
Alexis [name], Alexis [name]                 ACCOUNT NO.:  [id number]   MEDICAL RECORD NO.:  [id number]          PATIENT TYPE:  AMB   LOCATION:  DSC                          FACILITY:  [facility]   PHYSICIAN:  Rose Phi. Maple Hudson, M.D.   DATE OF BIRTH:  21-Apr-1951   DATE OF PROCEDURE:  02/22/2006  DATE OF DISCHARGE:                                 OPERATIVE REPORT   PREOPERATIVE DIAGNOSIS:  Atypical hyperplasia of the left breast.   POSTOPERATIVE DIAGNOSIS:  Atypical hyperplasia of the left breast.   OPERATION:  Left breast biopsy with needle localization and specimen  mammogram.   SURGEON:  Rose Phi. Maple Hudson, M.D.   ANESTHESIA:  MAC.   OPERATIVE PROCEDURE:  The patient placed on the operating table with arms  extended on the arm board; and the left breast prepped and draped in the  usual fashion.  The localizing wire, which had been placed preoperatively,  came from medial-to-lateral, centered at about the 10 o'clock position of  the left breast.  A curved incision, using the wire as a guide, was then  outlined with a marking pencil; and the area thoroughly infiltrated with the  local anesthetic.   The incision was made.  The wire was then delivered into the incision; and a  wide excision of the wire and surrounding tissue was carried out.  Hemostasis obtained with the cautery.  The specimen was submitted to the  radiologist for a specimen mammogram.   With good hemostasis, the deeper part of the breast was approximated to fill  the defect; and to do so without distorting the nipple; that was done with 3-  0 Vicryl.  Subcuticular 4-0 Monocryl and Steri-Strips was then used to  complete the closure.   Specimen mammogram confirmed the removal of the clip and the lesion.   Dressings were applied; and the patient transferred to the recovery room in  satisfactory condition, having tolerated the procedure well.      Rose Phi. Maple Hudson, M.D.  Electronically Signed     PRY/MEDQ  D:  02/22/2006  T:   02/22/2006  Job:  [id number]

## 2010-11-03 NOTE — Discharge Summary (Signed)
[place]  Patient:    Alexis [name], Alexis [name] Visit Number: [id number] MRN: [id number]          Service Type: SUR Location: 3000 [id number] [id number] Attending Physician:  Danella Penton Dictated by:   Tanya Nones. Jeral Fruit, M.D. Admit Date:  11/08/2001 Discharge Date: 11/21/2001   CC:         Dr. 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  CLINICAL HISTORY:  The patient was admitted because of headache.  A CT scan showed subarachnoid hemorrhage.  An angiogram was done as emergency that showed that indeed she had a ruptured anterior communicating aneurysm. Because of the location, the decision was made to coil instead of surgery.  LABORATORY DATA:  At the present time, within normal limits.  HOSPITAL COURSE:  The patient after the angiogram remained in the x-ray department and coiling was done by Dr. Pollie Meyer.  After that, the patient was transferred to the intensive care unit where she remained intubated.  Went for an intra-articular catheter which showed that, indeed, the ICP was within normal limits.  Eventually, the patient was extubated, was awake, and following command.  Close monitoring for the patients pulse was done during transcranial Doppler.  The patient was on Nimotop.  Right now, she is doing great.  She has no complaint whatsoever.  Mentally she is lucid.  She has no weakness and the husband already took vacation and he is going to be with her 24 hours a day.  She has been discharged to be followed by me in three weeks and by Dr. Pollie Meyer in three months for a repeat serial line ______.  CONDITION ON DISCHARGE:  Improved.  MEDICATIONS:  Nimotop 60 mg every four hours.  The husband knows that this is a medication  known to drop the blood pressure, but to prevent it as best as possible.  She will continue to taking that medication for at least an addition of seven days.  If the blood pressure is below 100, she needs to withhold.  He is to call me in case of any problems.  Also, she will be taking Vicodin as well as Dilantin 200 mg twice a day.  DIET:  Regular.  ACTIVITY:  She is not to drive.  She is not to do any type of physical activity.  FOLLOWUP:  I will be seeing her in my office in three weeks and Dr. Pollie Meyer in three months. Dictated by:   Tanya Nones. Jeral Fruit, M.D. Attending Physician:  Danella Penton DD:  11/21/01 TD:  11/24/01 Job: [id number]

## 2010-11-03 NOTE — Assessment & Plan Note (Signed)
HEALTHCARE                             PULMONARY OFFICE NOTE   Alexis [name], Alexis [name]                        MRN:          [id number]  DATE:05/24/2006                            DOB:          Aug 19, 1950    PRIMARY SERVICE COMPREHENSIVE HEALTH CARE EVALUATION   REASON FOR VISIT:  Followup fatigue, hyperlipidemia, and paresthesias.   HISTORY:  This is a 60 year old white female with multiple chronic  complaints, including chronic fatigue and paresthesias, mostly involving  the right face, ever since her cerebral aneurysm with coiling in May of  2003.  She does not believe anything is worse over the last year, and  attributes much of her fatigue to poor sleep hygiene (she says she just  has too much going on to sleep).  She is concerned that, when she sits  on the computer all day, her fingers appear to turn blue, but note  that this is all 5 fingers of both hands with no associated pain.  She  denies any headache, nausea, new distribution of paresthesias, which are  mostly involving her right face or fluctuations in her symptoms with  weather or environmental changes.   PAST MEDICAL HISTORY:  1. Cough, secondary to reflux, which was felt to be aggravated by      Fosamax.  2. Hyperlipidemia, target LDL less than 160 based on a single risk      factor of family history.  3. 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  No change by a      followup ultrasound in June of 2006.  4. Chronic fatigue.  5. Mild kyphoscoliosis.  6. Osteopenia.  Most recent bone densitometry December 2005 was felt      to be normal.  On Fosamax.  7. Status post tubal ligation, appendectomy, tonsillectomy.  8. Irritable bowel syndrome and GERD with mild esophageal      stricture/hernia documented by Dr. Russella Dar in 1999.  9. History of cerebral aneurysm status post embolization May of 2003      by Dr. Corliss Skains with persistent facial numbness ever since.   MEDICATIONS:   Taken in detail on the worksheet, correct in the column  dated May 24, 2006.   SOCIAL HISTORY:  She has never smoked.  She works as a Catering manager.  She  denies any unusual exposure history.  She denies any alcohol use.   FAMILY HISTORY:  Positive for heart disease in her father, 2 brothers,  and her sister, 1 of her brothers has diabetes.  Her mother is healthy,  except for asymptomatic hyperlipidemia.   REVIEW OF SYSTEMS:  Taken in detail on the worksheet and essentially  negative, except as outlined above.   PHYSICAL EXAMINATION:  This is an anxious and somewhat depressed-  appearing ambulatory white female in no acute distress.  VITAL SIGNS:  Stable vital signs.  Height of 5 feet 4 inches, which is  no change in baseline.  Weight is 130 pounds, blood pressure 116/70.  HEENT:  Unremarkable.  Oropharynx is clear.  Dentition intact.  Ocular  exam was  benign.  Ear canals clear bilaterally.  Facial exam appeared  perfectly symmetric.  Her appearance was immaculate as usual.  NECK:  Carotid upstrokes were brisk bilaterally without any bruits.  There was no thyromegaly or tracheal deviation.  LUNGS:  Fields perfectly clear bilaterally to auscultation and  percussion.  CARDIAC:  Regular rate and rhythm without murmur, gallop, or rub.  ABDOMEN:  Soft and benign.  EXTREMITIES:  Warm without calf tenderness, cyanosis, clubbing, or  edema.  She has brisk radials bilaterally with no cyanosis of the nail  beds and her fingers appear warm.   EKG was normal.  Labs, studies are pending.   IMPRESSION:  1. Hyperlipidemia with target LDL less than 160 based on the single      risk factor of positive family history.  Will check results when      available.  2. Esophageal stricture by previous upper endoscopy in 1999.      Previously, I believe this led to a cough while on Fosamax,      although this has not been a chronic complaint.  I plan to repeat a      bone densitometry to see if  continued use of Fosamax is even      necessary, however.  Based on the fact that she probably will      develop recurrent reflux at some point.  3. 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      Since there has been no change at all in the pattern, I do not      believe anything else needs to be done.  4. Chronic fatigue due to poor sleep hygiene and the fact that she      gets no regular exercise.  I have reviewed these issues with her in      detail in the context of health maintenance.  5. Complaints of hands turning blue when she spends all day on the      keyboard.  I have recommended some exercises for her, but do not      believe there are any problems with her circulation.     Alexis [name]. Sherene Sires, MD, [facility]  Electronically Signed    MBW/MedQ  DD: 05/26/2006  DT: 05/26/2006  Job #: [id number]

## 2011-05-04 ENCOUNTER — Other Ambulatory Visit: Payer: Self-pay | Admitting: Obstetrics and Gynecology

## 2011-05-04 DIAGNOSIS — R921 Mammographic calcification found on diagnostic imaging of breast: Secondary | ICD-10-CM

## 2011-06-04 ENCOUNTER — Ambulatory Visit
Admission: RE | Admit: 2011-06-04 | Discharge: 2011-06-04 | Disposition: A | Discharge status: Home / Self Care | Payer: BC Managed Care – PPO | Source: Ambulatory Visit | Attending: Obstetrics and Gynecology | Admitting: Obstetrics and Gynecology

## 2011-06-04 DIAGNOSIS — R921 Mammographic calcification found on diagnostic imaging of breast: Secondary | ICD-10-CM

## 2011-07-09 ENCOUNTER — Encounter: Payer: BC Managed Care – PPO | Admitting: Internal Medicine

## 2011-07-25 ENCOUNTER — Encounter: Payer: Self-pay | Admitting: Internal Medicine

## 2011-07-26 ENCOUNTER — Encounter: Payer: Self-pay | Admitting: Internal Medicine

## 2011-07-26 ENCOUNTER — Other Ambulatory Visit (INDEPENDENT_AMBULATORY_CARE_PROVIDER_SITE_OTHER): Payer: BC Managed Care – PPO

## 2011-07-26 ENCOUNTER — Ambulatory Visit (INDEPENDENT_AMBULATORY_CARE_PROVIDER_SITE_OTHER): Payer: BC Managed Care – PPO | Admitting: Internal Medicine

## 2011-07-26 VITALS — BP 128/80 | HR 64 | Temp 97.7°F | Ht 60.0 in | Wt 123.4 lb

## 2011-07-26 DIAGNOSIS — E785 Hyperlipidemia, unspecified: Secondary | ICD-10-CM

## 2011-07-26 DIAGNOSIS — Z0000 Encounter for general adult medical examination without abnormal findings: Secondary | ICD-10-CM

## 2011-07-26 DIAGNOSIS — M2550 Pain in unspecified joint: Secondary | ICD-10-CM

## 2011-07-26 DIAGNOSIS — M949 Disorder of cartilage, unspecified: Secondary | ICD-10-CM

## 2011-07-26 LAB — CBC WITH DIFFERENTIAL/PLATELET
Basophils Relative: 0.5 % (ref 0.0–3.0)
Eosinophils Absolute: 0.1 10*3/uL (ref 0.0–0.7)
HCT: 31.3 % — ABNORMAL LOW (ref 36.0–46.0)
Hemoglobin: 10.4 g/dL — ABNORMAL LOW (ref 12.0–15.0)
MCHC: 33.4 g/dL (ref 30.0–36.0)
MCV: 80.1 fl (ref 78.0–100.0)
Monocytes Absolute: 0.5 10*3/uL (ref 0.1–1.0)
Neutro Abs: 2.1 10*3/uL (ref 1.4–7.7)
RBC: 3.9 Mil/uL (ref 3.87–5.11)

## 2011-07-26 LAB — LIPID PANEL
Cholesterol: 149 mg/dL (ref 0–200)
Total CHOL/HDL Ratio: 4
Triglycerides: 77 mg/dL (ref 0.0–149.0)

## 2011-07-26 LAB — URINALYSIS
Bilirubin Urine: NEGATIVE
Hgb urine dipstick: NEGATIVE
Ketones, ur: NEGATIVE
Leukocytes, UA: NEGATIVE
pH: 5.5 (ref 5.0–8.0)

## 2011-07-26 LAB — BASIC METABOLIC PANEL
CO2: 31 mEq/L (ref 19–32)
Chloride: 103 mEq/L (ref 96–112)
Creatinine, Ser: 0.6 mg/dL (ref 0.4–1.2)
Potassium: 3.5 mEq/L (ref 3.5–5.1)
Sodium: 142 mEq/L (ref 135–145)

## 2011-07-26 LAB — HEPATIC FUNCTION PANEL
Albumin: 3.7 g/dL (ref 3.5–5.2)
Total Protein: 6.7 g/dL (ref 6.0–8.3)

## 2011-07-26 NOTE — Progress Notes (Signed)
Subjective:    Patient ID: Alexis [name], female    DOB: 08/16/1950  MRN: [id number]  HPI  44 yowf with asym hyperlipidemia followed by Alexis [name] for primary care with history of esophageal stricture requiring dilatation and intermittent noct choking sensations  June 07, 2008 ov stating noct cough/ throat worse x 5-6 months with mild dysphagia lower neck area no wt loss or overt hb symtoms.  rec two times a day ppi and diet   August 23, 2008 ov not able to do ppi two times a day and still with same dysphagia, noct hb especially severe on occasion, maybe twice per week, depending on what/ how late she eats.  rec GI eval pos for HH rec Dexilant .   July 04, 2010 cpx cough resolved. no tia or claudication.  rec no change rx  07/26/2011 f/u ov/Alexis [name]  cpx intermittent l lateral leg pain x 6 months lasts takes aleve nightly and controls it all right, no weakness or numbness. No pain with cough.    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  Alcohol Use - no  Occupation: Clinical biochemist for [employer] Use - no  Daily Caffeine Use 4-5 cups per day     Review of Systems  Constitutional: Negative for fever, chills, diaphoresis, activity change, appetite change, fatigue and unexpected weight change.  HENT: Negative for hearing loss, ear pain, nosebleeds, congestion, sore throat, facial swelling, rhinorrhea, sneezing, mouth sores, trouble swallowing, neck pain, neck stiffness, dental problem, voice change, postnasal drip, sinus pressure, tinnitus and ear discharge.   Eyes: Negative for photophobia, discharge, itching and visual disturbance.  Respiratory: Negative for apnea, cough, choking, chest tightness, shortness of breath, wheezing and stridor.   Cardiovascular: Negative for chest pain, palpitations and leg swelling.  Gastrointestinal: Negative for nausea, vomiting, abdominal pain, constipation, blood in stool and abdominal distention.  Genitourinary: Negative for dysuria, urgency, frequency, hematuria, flank pain, decreased urine volume and difficulty urinating.  Musculoskeletal: Positive for arthralgias. Negative for myalgias, back pain, joint swelling and gait problem.  Skin: Positive for rash. Negative for color change and pallor.  Neurological: Negative for dizziness, tremors, seizures, syncope, speech difficulty, weakness, light-headedness, numbness and headaches.  Hematological: Negative for adenopathy. Does not bruise/bleed easily.  Psychiatric/Behavioral: Negative for confusion, sleep disturbance and agitation. The patient is not nervous/anxious.        Objective:   Physical Exam   Ambulatory anxious wf in no acute distress.  wt 140 >  135 06/07/08 > 139 07/26/08 > 138 June 24, 2009 > 140 August 09, 2009 > 126 Oct 26, 2009 > 122 July 04, 2010 > 07/26/2011  123 HEENT: nl  dentition, turbinates, and orophanx  Neck without JVD/Nodes/TM  Lungs clear to A and P bilaterally without cough on insp or exp maneuvers  RRR no s3 or murmur or increase in P2  Abd soft and benign with nl excursion in the supine position. No bruits or organomegaly  Ext warm without calf tenderness, cyanosis clubbing or edema MS nl gait, no joint deformities or restrictions Neuro intact sensorium, no motor def Skin no lesions      Assessment & Plan:

## 2011-07-26 NOTE — Patient Instructions (Signed)
Better to take aleve with meals but if not effective call [facility] for evaluation or I can send you to an orthopedic doctor   Please remember to go to the lab  department downstairs for your tests - we will call you with the results when they are available.

## 2011-07-27 ENCOUNTER — Telehealth: Payer: Self-pay | Admitting: Internal Medicine

## 2011-07-27 DIAGNOSIS — M2550 Pain in unspecified joint: Secondary | ICD-10-CM | POA: Insufficient documentation

## 2011-07-27 LAB — VITAMIN D 25 HYDROXY (VIT D DEFICIENCY, FRACTURES): Vit D, 25-Hydroxy: 39 ng/mL (ref 30–89)

## 2011-07-27 NOTE — Assessment & Plan Note (Signed)
L hip and lower leg onset 2012 ? Referred from disc?   Rec either [facility] or ortho eval

## 2011-07-27 NOTE — Telephone Encounter (Signed)
Called and spoke with pt about her lab results per MW.  Pt voiced her understanding .

## 2011-07-27 NOTE — Assessment & Plan Note (Signed)
-   DEXA 07/04/06 Tspine .03, Left hip -.94, L fem Neck -1.4  - DEXA 07/12/08 Tspine -.2 Left F -.7 Right Fem -.4  - DEXA 08/15/10  T spine - 0.3, LF -0.8  RF -0.8  Vit D ok, dexa studies reviewed, no indication for additional rx

## 2011-07-27 NOTE — Assessment & Plan Note (Signed)
Adequate control on present rx, reviewed  

## 2011-09-19 ENCOUNTER — Other Ambulatory Visit: Payer: Self-pay | Admitting: *Deleted

## 2011-09-19 MED ORDER — ESOMEPRAZOLE MAGNESIUM 40 MG PO CPDR: 40.0000 mg | DELAYED_RELEASE_CAPSULE | Freq: Every day | ORAL | Status: DC

## 2011-09-19 MED ORDER — PRAVASTATIN SODIUM 40 MG PO TABS: 40.0000 mg | ORAL_TABLET | Freq: Every evening | ORAL | Status: DC

## 2012-05-06 ENCOUNTER — Other Ambulatory Visit: Payer: Self-pay | Admitting: Obstetrics and Gynecology

## 2012-05-06 DIAGNOSIS — Z1231 Encounter for screening mammogram for malignant neoplasm of breast: Secondary | ICD-10-CM

## 2012-06-05 ENCOUNTER — Inpatient Hospital Stay: Admission: RE | Admit: 2012-06-05 | Payer: BC Managed Care – PPO | Source: Ambulatory Visit

## 2012-06-09 ENCOUNTER — Other Ambulatory Visit: Payer: Self-pay | Admitting: Obstetrics and Gynecology

## 2012-06-09 DIAGNOSIS — Z1231 Encounter for screening mammogram for malignant neoplasm of breast: Secondary | ICD-10-CM

## 2012-06-16 ENCOUNTER — Ambulatory Visit
Admission: RE | Admit: 2012-06-16 | Discharge: 2012-06-16 | Disposition: A | Discharge status: Home / Self Care | Payer: BC Managed Care – PPO | Source: Ambulatory Visit | Attending: Obstetrics and Gynecology | Admitting: Obstetrics and Gynecology

## 2012-06-16 DIAGNOSIS — Z1231 Encounter for screening mammogram for malignant neoplasm of breast: Secondary | ICD-10-CM

## 2012-06-25 ENCOUNTER — Ambulatory Visit: Payer: BC Managed Care – PPO

## 2012-08-20 ENCOUNTER — Other Ambulatory Visit: Payer: Self-pay | Admitting: Internal Medicine

## 2012-08-20 MED ORDER — PRAVASTATIN SODIUM 40 MG PO TABS: 40.0000 mg | ORAL_TABLET | Freq: Every evening | ORAL | Status: DC

## 2013-03-25 ENCOUNTER — Encounter: Payer: Self-pay | Admitting: Internal Medicine

## 2013-03-25 ENCOUNTER — Other Ambulatory Visit (INDEPENDENT_AMBULATORY_CARE_PROVIDER_SITE_OTHER): Payer: BC Managed Care – PPO

## 2013-03-25 ENCOUNTER — Ambulatory Visit (INDEPENDENT_AMBULATORY_CARE_PROVIDER_SITE_OTHER): Payer: BC Managed Care – PPO | Admitting: Internal Medicine

## 2013-03-25 ENCOUNTER — Telehealth: Payer: Self-pay | Admitting: Internal Medicine

## 2013-03-25 VITALS — BP 148/80 | HR 53 | Temp 97.8°F | Ht 63.75 in | Wt 125.4 lb

## 2013-03-25 DIAGNOSIS — M2550 Pain in unspecified joint: Secondary | ICD-10-CM

## 2013-03-25 DIAGNOSIS — E785 Hyperlipidemia, unspecified: Secondary | ICD-10-CM

## 2013-03-25 DIAGNOSIS — I671 Cerebral aneurysm, nonruptured: Secondary | ICD-10-CM

## 2013-03-25 DIAGNOSIS — Z0000 Encounter for general adult medical examination without abnormal findings: Secondary | ICD-10-CM

## 2013-03-25 DIAGNOSIS — K219 Gastro-esophageal reflux disease without esophagitis: Secondary | ICD-10-CM

## 2013-03-25 DIAGNOSIS — M899 Disorder of bone, unspecified: Secondary | ICD-10-CM

## 2013-03-25 LAB — HEPATIC FUNCTION PANEL
ALT: 14 U/L (ref 0–35)
AST: 20 U/L (ref 0–37)
Total Bilirubin: 1 mg/dL (ref 0.3–1.2)

## 2013-03-25 LAB — BASIC METABOLIC PANEL
BUN: 13 mg/dL (ref 6–23)
CO2: 30 mEq/L (ref 19–32)
GFR: 92.84 mL/min (ref >60.00)
Potassium: 4.1 mEq/L (ref 3.5–5.1)
Sodium: 142 mEq/L (ref 135–145)

## 2013-03-25 LAB — CBC WITH DIFFERENTIAL/PLATELET
Eosinophils Absolute: 0.1 10*3/uL (ref 0.0–0.7)
Eosinophils Relative: 2.7 % (ref 0.0–5.0)
HCT: 29 % — ABNORMAL LOW (ref 36.0–46.0)
Hemoglobin: 9.9 g/dL — ABNORMAL LOW (ref 12.0–15.0)
Lymphs Abs: 1.2 10*3/uL (ref 0.7–4.0)
MCHC: 34.3 g/dL (ref 30.0–36.0)
Monocytes Relative: 9.8 % (ref 3.0–12.0)
Platelets: 198 10*3/uL (ref 150.0–400.0)
WBC: 4.8 10*3/uL (ref 4.5–10.5)

## 2013-03-25 LAB — URINALYSIS
Bilirubin Urine: NEGATIVE
Hgb urine dipstick: NEGATIVE
Leukocytes, UA: NEGATIVE
Nitrite: NEGATIVE
Total Protein, Urine: NEGATIVE
Urobilinogen, UA: 0.2 (ref 0.0–1.0)
pH: 6 (ref 5.0–8.0)

## 2013-03-25 LAB — LIPID PANEL: Total CHOL/HDL Ratio: 5

## 2013-03-25 LAB — TSH: TSH: 1.81 u[IU]/mL (ref 0.35–5.50)

## 2013-03-25 NOTE — Telephone Encounter (Signed)
I spoke with patient about results and she verbalized understanding and had no questions 

## 2013-03-25 NOTE — Patient Instructions (Addendum)
Please remember to go to the lab   department downstairs for your tests - we will call you with the results when they are available.    

## 2013-03-25 NOTE — Progress Notes (Signed)
Quick Note:  lmomtcb for pt ______ 

## 2013-03-25 NOTE — Progress Notes (Signed)
Subjective:    Patient ID: Alexis [name], female    DOB: [birth year]  MRN: [id number]   Brief patient profile:  67 yowf with asym hyperlipidemia followed by Alexis [name] for primary care with history of esophageal stricture requiring dilatation and intermittent noct choking sensations    History of Present Illness  June 07, 2008 ov stating noct cough/ throat worse x 5-6 months with mild dysphagia lower neck area no wt loss or overt hb symtoms.  rec two times a day ppi and diet   August 23, 2008 ov not able to do ppi two times a day and still with same dysphagia, noct hb especially severe on occasion, maybe twice per week, depending on what/ how late she eats.  rec GI eval pos for HH rec Dexilant .   July 04, 2010 cpx cough resolved. no tia or claudication.  rec no change rx  07/26/2011 f/u ov/Alexis [name]  cpx intermittent l lateral leg pain x 6 months lasts takes aleve nightly and controls it all right, no weakness or numbness. No pain with cough. rec Better to take aleve with meals but if not effective call [facility] for evaluation or I can send you to an orthopedic doctor   03/25/2013 f/u ov/Alexis [name] re: cpx/ hyperlipidemia Chief Complaint  Patient presents with  . Annual Exam    Pt states doing well and denies any co's today.    Still takes aleve daily for L hip/leg pain Very active but no aerobics Still has some gerd related to late meals   No obvious day to day or daytime variabilty or assoc chronic cough or cp or chest tightness, subjective wheeze overt sinus   symptoms. No unusual exp hx or h/o childhood pna/ asthma or knowledge of premature birth.  Sleeping ok without nocturnal  or early am exacerbation  of respiratory  c/o's or need for noct saba. Also denies any obvious fluctuation of symptoms with weather or environmental changes or other aggravating or alleviating factors except as outlined above   Current Medications, Allergies, Complete Past Medical History, Past Surgical History,  Family History, and Social History were reviewed in [employer] record.  ROS  The following are not active complaints unless bolded sore throat, dysphagia, dental problems, itching, sneezing,  nasal congestion or excess/ purulent secretions, ear ache,   fever, chills, sweats, unintended wt loss, pleuritic or exertional cp, hemoptysis,  orthopnea pnd or leg swelling, presyncope, palpitations, heartburn, abdominal pain, anorexia, nausea, vomiting, diarrhea  or change in bowel or urinary habits, change in stools or urine, dysuria,hematuria,  rash, arthralgias, visual complaints, headache, numbness weakness or ataxia or problems with walking or coordination,  change in mood/affect or memory.         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.3) 2003 with chronic face numbness since  IRRITABLE BOWEL SYNDROME (ICD-564.1)  OSTEOPENIA (ICD-733.90)  - DEXA 07/04/06 Tspine .03, Left hip -.94, L fem Neck -1.4  - DEXA 07/12/08 Tspine -.2 Left F -.7 Right Fem -.4  - DEXA 08/15/10  T spine - 0.3, LF -0.8  RF -0.8  KYPHOSCOLIOSIS, 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   wt 140 > 135 06/07/08 > 139 07/26/08 > 138 June 24, 2009 > 140 August 09, 2009 > 126 Oct 26, 2009 > 122 July 04, 2010 > 07/26/2011  123> 03/25/2013  125   HEENT: nl dentition, turbinates, and orophanx  Neck without JVD/Nodes/TM  Lungs clear to A and P bilaterally without cough on insp or exp maneuvers  RRR no s3 or murmur or increase in P2  Abd soft and benign with nl excursion in the supine position. No bruits or organomegaly  Ext warm without calf tenderness, cyanosis clubbing or edema MS nl gait, no joint deformities or restrictions Neuro intact sensorium, no motor def Skin no lesions GU /rectal per [name]         Assessment & Plan:

## 2013-03-26 ENCOUNTER — Telehealth: Payer: Self-pay | Admitting: Internal Medicine

## 2013-03-26 DIAGNOSIS — D649 Anemia, unspecified: Secondary | ICD-10-CM

## 2013-03-26 LAB — VITAMIN D 25 HYDROXY (VIT D DEFICIENCY, FRACTURES): Vit D, 25-Hydroxy: 49 ng/mL (ref 30–89)

## 2013-03-26 NOTE — Telephone Encounter (Signed)
Studies are unremarkable x borderline anemia. rec recheck in 3 months with Fe panel but no rx needed Will call p 2:30 per pt

## 2013-03-27 NOTE — Assessment & Plan Note (Signed)
-   DEXA 07/04/06 Tspine .03, Left hip -.94, L fem Neck -1.4  - DEXA 07/12/08 Tspine -.2 Left F -.7 Right Fem -.4  - DEXA 08/15/10  T spine - 0.3, LF -0.8  RF -0.8   Vit d levels ok, rel low risk given nl dexa x 3 > rx calcium and vit d only for now

## 2013-03-27 NOTE — Assessment & Plan Note (Signed)
Continue pepcid and diet reviewed

## 2013-03-27 NOTE — Progress Notes (Signed)
Quick Note:  Pt aware per phone msg on 03/26/13. ______

## 2013-03-27 NOTE — Progress Notes (Signed)
Quick Note:  Pt aware per phone msg on 03/26/13. ______ 

## 2013-03-27 NOTE — Telephone Encounter (Signed)
Spoke with pt.  Informed her of lab results and recs per Dr. Sherene Sires.  Pt states she gave blood last week and this may be the reason for the low levels.  She will still return in 3 months for lab recheck.  Orders have been placed.  Pt aware and voiced no further questions or concerns at this time.

## 2013-03-27 NOTE — Assessment & Plan Note (Signed)
L hip and lower leg onset 2012 ? Referred from disc?   Ok control on prn nsaids, f/u [facility] prn

## 2013-03-27 NOTE — Assessment & Plan Note (Signed)
-   Target LDL < 160 in absence of risk factors  Lab Results  Component Value Date   CHOL 191 03/25/2013   HDL 39.40 03/25/2013   LDLCALC 128* 03/25/2013   LDLDIRECT 151.4 07/04/2010   TRIG 120.0 03/25/2013   CHOLHDL 5 03/25/2013   Adequate control on present rx, reviewed > no change in rx needed

## 2013-04-01 ENCOUNTER — Other Ambulatory Visit: Payer: Self-pay | Admitting: Internal Medicine

## 2013-04-01 MED ORDER — ESOMEPRAZOLE MAGNESIUM 40 MG PO CPDR: 40.0000 mg | DELAYED_RELEASE_CAPSULE | Freq: Every day | ORAL | Status: DC

## 2013-08-18 ENCOUNTER — Other Ambulatory Visit: Payer: Self-pay

## 2013-08-18 DIAGNOSIS — Z1231 Encounter for screening mammogram for malignant neoplasm of breast: Secondary | ICD-10-CM

## 2013-09-10 ENCOUNTER — Ambulatory Visit
Admission: RE | Admit: 2013-09-10 | Discharge: 2013-09-10 | Disposition: A | Discharge status: Home / Self Care | Payer: BC Managed Care – PPO | Source: Ambulatory Visit

## 2013-09-10 DIAGNOSIS — Z1231 Encounter for screening mammogram for malignant neoplasm of breast: Secondary | ICD-10-CM

## 2013-09-11 ENCOUNTER — Encounter: Payer: Self-pay | Admitting: Internal Medicine

## 2013-09-16 ENCOUNTER — Other Ambulatory Visit: Payer: Self-pay | Admitting: Internal Medicine

## 2014-04-12 ENCOUNTER — Other Ambulatory Visit: Payer: Self-pay | Admitting: Internal Medicine

## 2014-04-12 MED ORDER — ESOMEPRAZOLE MAGNESIUM 40 MG PO CPDR: 40.0000 mg | DELAYED_RELEASE_CAPSULE | Freq: Every day | ORAL | Status: DC

## 2014-04-15 ENCOUNTER — Encounter: Payer: Self-pay | Admitting: Internal Medicine

## 2014-04-15 ENCOUNTER — Other Ambulatory Visit (INDEPENDENT_AMBULATORY_CARE_PROVIDER_SITE_OTHER): Payer: BC Managed Care – PPO

## 2014-04-15 ENCOUNTER — Ambulatory Visit (INDEPENDENT_AMBULATORY_CARE_PROVIDER_SITE_OTHER): Payer: BC Managed Care – PPO | Admitting: Internal Medicine

## 2014-04-15 VITALS — BP 148/78 | HR 55 | Ht 65.0 in | Wt 122.4 lb

## 2014-04-15 DIAGNOSIS — Z0000 Encounter for general adult medical examination without abnormal findings: Secondary | ICD-10-CM

## 2014-04-15 DIAGNOSIS — I491 Atrial premature depolarization: Secondary | ICD-10-CM

## 2014-04-15 DIAGNOSIS — E785 Hyperlipidemia, unspecified: Secondary | ICD-10-CM

## 2014-04-15 DIAGNOSIS — M810 Age-related osteoporosis without current pathological fracture: Secondary | ICD-10-CM

## 2014-04-15 LAB — BASIC METABOLIC PANEL
BUN: 11 mg/dL (ref 6–23)
CO2: 21 mEq/L (ref 19–32)
CREATININE: 0.8 mg/dL (ref 0.4–1.2)
Calcium: 9.4 mg/dL (ref 8.4–10.5)
Chloride: 103 mEq/L (ref 96–112)
GFR: 73.51 mL/min (ref >60.00)
Glucose, Bld: 91 mg/dL (ref 70–99)
POTASSIUM: 4.1 meq/L (ref 3.5–5.1)
Sodium: 140 mEq/L (ref 135–145)

## 2014-04-15 LAB — CBC WITH DIFFERENTIAL/PLATELET
Basophils Absolute: 0 10*3/uL (ref 0.0–0.1)
Basophils Relative: 0.5 % (ref 0.0–3.0)
EOS PCT: 1.7 % (ref 0.0–5.0)
Eosinophils Absolute: 0.1 10*3/uL (ref 0.0–0.7)
HCT: 32.9 % — ABNORMAL LOW (ref 36.0–46.0)
Hemoglobin: 10.7 g/dL — ABNORMAL LOW (ref 12.0–15.0)
Lymphocytes Relative: 25.7 % (ref 12.0–46.0)
Lymphs Abs: 1.2 10*3/uL (ref 0.7–4.0)
MCHC: 32.6 g/dL (ref 30.0–36.0)
MCV: 83.8 fl (ref 78.0–100.0)
MONOS PCT: 12 % (ref 3.0–12.0)
Monocytes Absolute: 0.6 10*3/uL (ref 0.1–1.0)
NEUTROS PCT: 60.1 % (ref 43.0–77.0)
Neutro Abs: 2.9 10*3/uL (ref 1.4–7.7)
PLATELETS: 205 10*3/uL (ref 150.0–400.0)
RBC: 3.93 Mil/uL (ref 3.87–5.11)
RDW: 13.6 % (ref 11.5–15.5)
WBC: 4.8 10*3/uL (ref 4.0–10.5)

## 2014-04-15 LAB — URINALYSIS, ROUTINE W REFLEX MICROSCOPIC
Bilirubin Urine: NEGATIVE
Hgb urine dipstick: NEGATIVE
KETONES UR: NEGATIVE
NITRITE: NEGATIVE
Specific Gravity, Urine: 1.015 (ref 1.000–1.030)
TOTAL PROTEIN, URINE-UPE24: NEGATIVE
Urine Glucose: NEGATIVE
Urobilinogen, UA: 0.2 (ref 0.0–1.0)
pH: 7.5 (ref 5.0–8.0)

## 2014-04-15 LAB — LIPID PANEL
CHOL/HDL RATIO: 4
Cholesterol: 188 mg/dL (ref 0–200)
HDL: 43.4 mg/dL (ref >39.00)
LDL CALC: 123 mg/dL — AB (ref 0–99)
NonHDL: 144.6
TRIGLYCERIDES: 110 mg/dL (ref 0.0–149.0)
VLDL: 22 mg/dL (ref 0.0–40.0)

## 2014-04-15 LAB — HEPATIC FUNCTION PANEL
ALT: 19 U/L (ref 0–35)
AST: 22 U/L (ref 0–37)
Albumin: 3.7 g/dL (ref 3.5–5.2)
Alkaline Phosphatase: 68 U/L (ref 39–117)
BILIRUBIN DIRECT: 0.1 mg/dL (ref 0.0–0.3)
BILIRUBIN TOTAL: 0.8 mg/dL (ref 0.2–1.2)
Total Protein: 7.2 g/dL (ref 6.0–8.3)

## 2014-04-15 LAB — TSH: TSH: 1.76 u[IU]/mL (ref 0.35–4.50)

## 2014-04-15 NOTE — Patient Instructions (Addendum)
To get the most out of exercise, you need to be continuously aware that you are short of breath, but never out of breath, for 30 minutes daily. As you improve, it will actually be easier for you to do the same amount of exercise  in  30 minutes so always push to the level where you are short of breath.   Please remember to go to the lab   department downstairs for your tests - we will call you with the results when they are available.  Avoid caffeine and call us if you want to schedule a 24 heart monitor   Please see patient coordinator before you leave today  to schedule bone density   Return in 12 months for cpx

## 2014-04-15 NOTE — Progress Notes (Signed)
Subjective:    Patient ID: Alexis [name], female    DOB: [birth year]  MRN: [id number]   Brief patient profile:  32 yowf with asym hyperlipidemia followed by [name] for primary care with history of esophageal stricture requiring dilatation and intermittent noct choking sensations better on GERD rx     History of Present Illness    04/15/2014 f/u ov/Claud Gowan re: cpx Chief Complaint  Patient presents with  . Annual Exam    Pt is fasting. She denies any c/o's today.      Some palpitations, still drinking a lot of coffee, not noticeable with exercise  but no aerobics  Not limited by breathing from desired activities     No obvious day to day or daytime variabilty or assoc chronic cough or cp or chest tightness, subjective wheeze overt sinus   symptoms. No unusual exp hx or h/o childhood pna/ asthma or knowledge of premature birth.  Sleeping ok without nocturnal  or early am exacerbation  of respiratory  c/o's or need for noct saba. Also denies any obvious fluctuation of symptoms with weather or environmental changes or other aggravating or alleviating factors except as outlined above   Current Medications, Allergies, Complete Past Medical History, Past Surgical History, Family History, and Social History were reviewed in [employer] record.  ROS  The following are not active complaints unless bolded sore throat, dysphagia, dental problems, itching, sneezing,  nasal congestion or excess/ purulent secretions, ear ache,   fever, chills, sweats, unintended wt loss, pleuritic or exertional cp, hemoptysis,  orthopnea pnd or leg swelling, presyncope, palpitations, heartburn, abdominal pain, anorexia, nausea, vomiting, diarrhea  or change in bowel or urinary habits, change in stools or urine, dysuria,hematuria,  rash, arthralgias, visual complaints, headache, numbness weakness or ataxia or problems with walking or coordination,  change in mood/affect or memory.         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  IRRITABLE BOWEL SYNDROME (ICD-564.1)  OSTEOPENIA (ICD-733.90)  - DEXA 07/04/06 Tspine .03, Left hip -.94, L fem Neck -1.4  - DEXA 07/12/08 Tspine -.2 Left F -.7 Right Fem -.4  - DEXA 08/15/10  T spine - 0.3, LF -0.8  RF -0.8  KYPHOSCOLIOSIS, 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   wt 140 > 135 06/07/08 > 139 07/26/08 > 138 June 24, 2009 > 140 August 09, 2009 > 126 Oct 26, 2009 > 122 July 04, 2010 > 07/26/2011  123> 03/25/2013  125  > 04/15/2014  122  HEENT: nl dentition, turbinates, and orophanx  Neck without JVD/Nodes/TM  Lungs clear to A and P bilaterally without cough on insp or exp maneuvers  RRR no s3 or murmur or increase in P2  Abd soft and benign with nl excursion in the supine position. No bruits or organomegaly  Ext warm without calf tenderness, cyanosis clubbing or edema MS nl gait, no joint deformities or restrictions Neuro intact sensorium,  no motor deficits Skin no lesions GU /rectal per [facility]    Recent Labs Lab 04/15/14 0935  NA 140  K 4.1  CL 103  CO2 21  BUN 11  CREATININE 0.8  GLUCOSE 91    Recent Labs Lab 04/15/14 0935  HGB 10.7*  HCT 32.9*  WBC 4.8  PLT 205.0     Lab Results  Component Value Date   TSH 1.76 04/15/2014     Lab Results  Component Value Date   CHOL 188 04/15/2014   HDL 43.40 04/15/2014   LDLCALC 123* 04/15/2014   LDLDIRECT 151.4 07/04/2010   TRIG 110.0 04/15/2014   CHOLHDL 4 04/15/2014          Assessment & Plan:

## 2014-04-16 ENCOUNTER — Ambulatory Visit (INDEPENDENT_AMBULATORY_CARE_PROVIDER_SITE_OTHER)
Admission: RE | Admit: 2014-04-16 | Discharge: 2014-04-16 | Disposition: A | Discharge status: Home / Self Care | Payer: BC Managed Care – PPO | Source: Ambulatory Visit | Attending: Internal Medicine | Admitting: Internal Medicine

## 2014-04-16 ENCOUNTER — Telehealth: Payer: Self-pay | Admitting: Internal Medicine

## 2014-04-16 DIAGNOSIS — M810 Age-related osteoporosis without current pathological fracture: Secondary | ICD-10-CM

## 2014-04-16 NOTE — Progress Notes (Signed)
Quick Note:  LMTCB ______ 

## 2014-04-16 NOTE — Telephone Encounter (Signed)
Spoke with pt and notified of recent lab results per Dr. [name]. Pt verbalized understanding and denied any questions.

## 2014-04-16 NOTE — Assessment & Plan Note (Signed)
Up to date x declines flu shot

## 2014-04-16 NOTE — Assessment & Plan Note (Signed)
Adequate control on present rx, reviewed > no change in rx needed  = pravachol 40 mg daily   See instructions for specific recommendations which were reviewed directly with the patient who was given a copy with highlighter outlining the key components.

## 2014-04-16 NOTE — Assessment & Plan Note (Signed)
Remote dx MVP requiring dental prophylaxis per cards    - see EKG 04/15/14    rec reg aerobics, d/c caffeine

## 2014-04-16 NOTE — Assessment & Plan Note (Signed)
-   DEXA 07/04/06 Tspine .03, Left hip -.94, L fem Neck -1.4  - DEXA 07/12/08 Tspine -.2 Left F -.7 Right Fem -.4  - DEXA 08/15/10  T spine - 0.3, LF -0.8  RF -0.8 - DEXA repeat rec 04/15/14   Also needs vit D levels

## 2014-04-19 ENCOUNTER — Telehealth: Payer: Self-pay | Admitting: Internal Medicine

## 2014-04-19 NOTE — Progress Notes (Signed)
Quick Note:  LMTCB ______ 

## 2014-04-19 NOTE — Telephone Encounter (Signed)
LMTCB x 1  Notes Recorded by Tanda Rockers, MD on 04/19/2014 at 10:48 AM Call patient : Study is very minimally changed since previous study, no osteoporosis so no change in recs

## 2014-04-20 NOTE — Telephone Encounter (Signed)
lmomtcb x1 

## 2014-04-20 NOTE — Telephone Encounter (Signed)
Pt returning call, says that to call hm phone and leave msg on answering machine to avoid playing phone tag.Alexis [name]

## 2014-04-20 NOTE — Telephone Encounter (Signed)
Pt returned call & can be reached at [phone].  Alexis [name]

## 2014-04-20 NOTE — Telephone Encounter (Signed)
Spoke with pt and advised of lab results and bone density results per Dr [name]

## 2014-04-21 ENCOUNTER — Other Ambulatory Visit: Payer: Self-pay | Admitting: Internal Medicine

## 2014-04-26 ENCOUNTER — Telehealth: Payer: Self-pay | Admitting: Internal Medicine

## 2014-04-26 MED ORDER — PANTOPRAZOLE SODIUM 40 MG PO TBEC: 40.0000 mg | DELAYED_RELEASE_TABLET | Freq: Every day | ORAL | Status: DC

## 2014-04-26 NOTE — Telephone Encounter (Signed)
Pt aware of recs. rx sent in. Nothing further needed 

## 2014-04-26 NOTE — Telephone Encounter (Signed)
Spoke with the pt  She states that her ins no longer covers nexium  She is asking to switch to something generic for reflux  Please advise thanks!

## 2014-04-26 NOTE — Telephone Encounter (Signed)
Pantoprazole (protonix) 40 mg   Take 30-60 min before first meal of the day

## 2014-07-13 NOTE — Telephone Encounter (Signed)
error 

## 2014-08-24 ENCOUNTER — Other Ambulatory Visit: Payer: Self-pay

## 2014-08-24 DIAGNOSIS — Z1231 Encounter for screening mammogram for malignant neoplasm of breast: Secondary | ICD-10-CM

## 2014-09-13 ENCOUNTER — Other Ambulatory Visit: Payer: Self-pay | Admitting: Internal Medicine

## 2014-09-17 ENCOUNTER — Ambulatory Visit
Admission: RE | Admit: 2014-09-17 | Discharge: 2014-09-17 | Disposition: A | Discharge status: Home / Self Care | Payer: BLUE CROSS/BLUE SHIELD | Source: Ambulatory Visit

## 2014-09-17 DIAGNOSIS — Z1231 Encounter for screening mammogram for malignant neoplasm of breast: Secondary | ICD-10-CM

## 2014-12-10 ENCOUNTER — Ambulatory Visit (INDEPENDENT_AMBULATORY_CARE_PROVIDER_SITE_OTHER): Payer: BLUE CROSS/BLUE SHIELD | Admitting: Podiatry

## 2014-12-10 ENCOUNTER — Ambulatory Visit (INDEPENDENT_AMBULATORY_CARE_PROVIDER_SITE_OTHER): Payer: BLUE CROSS/BLUE SHIELD

## 2014-12-10 ENCOUNTER — Encounter: Payer: Self-pay | Admitting: Podiatry

## 2014-12-10 VITALS — BP 129/64 | HR 64 | Resp 12

## 2014-12-10 DIAGNOSIS — M2010 Hallux valgus (acquired), unspecified foot: Secondary | ICD-10-CM

## 2014-12-10 NOTE — Progress Notes (Signed)
   Subjective:    Patient ID: Alexis [name], female    DOB: 03/31/1951, 64 y.o.   MRN: [id number]  HPI  Pt stated rt foot bunion and b/l toes are curving in for about 1 year. The foot is getting worse especially when wearing shoes. Tried no treatment.  Review of Systems  Skin: Positive for rash.       Objective:   Physical Exam        Assessment & Plan:

## 2014-12-11 NOTE — Progress Notes (Signed)
Subjective:     Patient ID: Alexis [name], female   DOB: Feb 09, 1951, 64 y.o.   MRN: [id number]  HPI I have this bunion on my right foot that is becoming increasingly painful and I was just concerned about my second toes as to whether there are problem. The bunion has been getting worse over the last year and I've tried wider shoes soft leather and soaks without relief of symptoms   Review of Systems  All other systems reviewed and are negative.      Objective:   Physical Exam  Constitutional: She is oriented to person, place, and time.  Cardiovascular: Intact distal pulses.   Musculoskeletal: Normal range of motion.  Neurological: She is oriented to person, place, and time.  Skin: Skin is warm.  Nursing note and vitals reviewed.  neurovascular status intact muscle strength adequate with range of motion subtalar midtarsal joint within normal limits. Patient's found to have good digital perfusion is well oriented 3 and I noted there to be discomfort on the medial dorsal side of the right first metatarsal head with redness and pain when palpated. Second toes have slight deformity but there is no pain and they do not seem to be getting worse over time     Assessment:     Structural HAV deformity right over left with no other indications of pathology and mild digital deformity with no indications of current pathology    Plan:     H&P and x-rays reviewed with patient due to the fact the right first metatarsal is symptomatic and becoming increasingly tender I do think would be best fixed and I reviewed Austin-type osteotomy with removal of the dorsal medial eminence. I explained surgery patient wants to have this done and is scheduled for outpatient surgery in the next several months and will reappoint for consult prior to procedure. She is encouraged to come with all questions concerning the correction of this deformity

## 2015-02-16 ENCOUNTER — Ambulatory Visit (INDEPENDENT_AMBULATORY_CARE_PROVIDER_SITE_OTHER): Payer: BLUE CROSS/BLUE SHIELD | Admitting: Podiatry

## 2015-02-16 ENCOUNTER — Encounter: Payer: Self-pay | Admitting: Podiatry

## 2015-02-16 DIAGNOSIS — M2010 Hallux valgus (acquired), unspecified foot: Secondary | ICD-10-CM

## 2015-02-16 NOTE — Progress Notes (Signed)
Subjective:     Patient ID: Alexis [name], female   DOB: 11/18/1950, 64 y.o.   MRN: [id number]  HPI patient presents stating I'm ready to get my bunion fixed on my right foot. States it's been hurting her for a long time and she's tried wider shoes she's tried padding and anti-inflammatory is without relief   Review of Systems     Objective:   Physical Exam  neurovascular status found to be intact muscle strength adequate with hyperostosis with redness and pain first metatarsal right with deviation of the big toe    Assessment:      structural bunion deformity with a lot of pain right first MPJ    Plan:      reviewed x-rays and discussed situation with patient. She wants surgery and I allowed her to read consent form reviewing possible alternative treatments and complications. Patient is comfortable with this and at this time after extensive review signs consent form understanding recovery is a proximally 6 months to one year. Dispense air fracture walker with instructions on usage

## 2015-02-16 NOTE — Patient Instructions (Signed)
Pre-Operative Instructions  Congratulations, you have decided to take an important step to improving your quality of life.  You can be assured that the doctors of [facility] will be with you every step of the way.  1. Plan to be at the surgery center/hospital at least 1 (one) hour prior to your scheduled time unless otherwise directed by the surgical center/hospital staff.  You must have a responsible adult accompany you, remain during the surgery and drive you home.  Make sure you have directions to the surgical center/hospital and know how to get there on time. 2. For hospital based surgery you will need to obtain a history and physical form from your family physician within 1 month prior to the date of surgery- we will give you a form for you primary physician.  3. We make every effort to accommodate the date you request for surgery.  There are however, times where surgery dates or times have to be moved.  We will contact you as soon as possible if a change in schedule is required.   4. No Aspirin/Ibuprofen for one week before surgery.  If you are on aspirin, any non-steroidal anti-inflammatory medications (Mobic, Aleve, Ibuprofen) you should stop taking it 7 days prior to your surgery.  You make take Tylenol  For pain prior to surgery.  5. Medications- If you are taking daily heart and blood pressure medications, seizure, reflux, allergy, asthma, anxiety, pain or diabetes medications, make sure the surgery center/hospital is aware before the day of surgery so they may notify you which medications to take or avoid the day of surgery. 6. No food or drink after midnight the night before surgery unless directed otherwise by surgical center/hospital staff. 7. No alcoholic beverages 24 hours prior to surgery.  No smoking 24 hours prior to or 24 hours after surgery. 8. Wear loose pants or shorts- loose enough to fit over bandages, boots, and casts. 9. No slip on shoes, sneakers are best. 10. Bring  your boot with you to the surgery center/hospital.  Also bring crutches or a walker if your physician has prescribed it for you.  If you do not have this equipment, it will be provided for you after surgery. 11. If you have not been contracted by the surgery center/hospital by the day before your surgery, call to confirm the date and time of your surgery. 12. Leave-time from work may vary depending on the type of surgery you have.  Appropriate arrangements should be made prior to surgery with your employer. 13. Prescriptions will be provided immediately following surgery by your doctor.  Have these filled as soon as possible after surgery and take the medication as directed. 14. Remove nail polish on the operative foot. 15. Wash the night before surgery.  The night before surgery wash the foot and leg well with the antibacterial soap provided and water paying special attention to beneath the toenails and in between the toes.  Rinse thoroughly with water and dry well with a towel.  Perform this wash unless told not to do so by your physician.  Enclosed: 1 Ice pack (please put in freezer the night before surgery)   1 Hibiclens skin cleaner   Pre-op Instructions  If you have any questions regarding the instructions, do not hesitate to call our office.  [place]: [address] [place], [place] [address] [phone]  [place]: [address] [place], [place] [address] [phone]  [place]: [address]  [place], [place] [address] [phone]  Dr. Richard   Tuchman DPM, Dr. Taveon Enyeart DPM Dr. Richard Sikora DPM, Dr. M. Todd Hyatt DPM, Dr. Kathryn Egerton DPM 

## 2015-02-24 ENCOUNTER — Other Ambulatory Visit: Payer: Self-pay | Admitting: Internal Medicine

## 2015-03-01 DIAGNOSIS — M2011 Hallux valgus (acquired), right foot: Secondary | ICD-10-CM | POA: Diagnosis not present

## 2015-03-04 ENCOUNTER — Telehealth: Payer: Self-pay | Admitting: *Deleted

## 2015-03-04 NOTE — Telephone Encounter (Signed)
"  I'm doing okay with my foot.  The boot is very uncomfortable.  It feels like it's too big and bulky.  My foot hasn't hurt the first time.  I been keeping it elevated.  I'm scheduled to go back to work on Monday.  When will he give me a wood shoe so I can drive?"  I can't determine that.  Dr. Paulla Dolly will make the determination when you come for your next visit.  "I'll just drive with the boot on."  That is not recommended, it can cause you to have an accident.  Call on Monday for the nurse and see what Dr. Paulla Dolly recommends for you.  If you have any concerns over the weekend, call the pager.  "Okay, thanks for calling."

## 2015-03-11 ENCOUNTER — Ambulatory Visit (INDEPENDENT_AMBULATORY_CARE_PROVIDER_SITE_OTHER): Payer: BLUE CROSS/BLUE SHIELD

## 2015-03-11 ENCOUNTER — Ambulatory Visit (INDEPENDENT_AMBULATORY_CARE_PROVIDER_SITE_OTHER): Payer: BLUE CROSS/BLUE SHIELD | Admitting: Podiatry

## 2015-03-11 VITALS — BP 131/80 | HR 55 | Temp 97.6°F | Resp 16

## 2015-03-11 DIAGNOSIS — Z9889 Other specified postprocedural states: Secondary | ICD-10-CM | POA: Diagnosis not present

## 2015-03-11 DIAGNOSIS — M2010 Hallux valgus (acquired), unspecified foot: Secondary | ICD-10-CM

## 2015-03-13 NOTE — Progress Notes (Signed)
Subjective:     Patient ID: Alexis [name], female   DOB: Aug 17, 1950, 64 y.o.   MRN: [id number]  HPI patient states I'm doing very well with minimal discomfort or swelling in my foot   Review of Systems     Objective:   Physical Exam Neurovascular status intact muscle strength adequate range of motion within normal limits with good alignment of the first MPJ right with wound edges well coapted and hallux in rectus position with negative Homans sign noted    Assessment:     Doing well post Austin-type osteotomy right with good alignment and good fixation    Plan:     H&P and x-rays reviewed of condition. Today I went ahead and I reapplied sterile dressing and dispensed surgical shoe instructed on increased ambulation but continued immobilization and reappoint to recheck 3 weeks earlier if any issues should occur

## 2015-03-17 ENCOUNTER — Telehealth: Payer: Self-pay | Admitting: Internal Medicine

## 2015-03-17 NOTE — Telephone Encounter (Signed)
Called spoke with pt. appt scheduled for 11/3 at 8:45. If her insurance does not cover this she will call and cancel after she speaks w/ them. Nothing further needed

## 2015-04-01 ENCOUNTER — Ambulatory Visit (INDEPENDENT_AMBULATORY_CARE_PROVIDER_SITE_OTHER): Payer: BLUE CROSS/BLUE SHIELD

## 2015-04-01 ENCOUNTER — Ambulatory Visit (INDEPENDENT_AMBULATORY_CARE_PROVIDER_SITE_OTHER): Payer: BLUE CROSS/BLUE SHIELD | Admitting: Podiatry

## 2015-04-01 DIAGNOSIS — Z9889 Other specified postprocedural states: Secondary | ICD-10-CM

## 2015-04-01 DIAGNOSIS — M2010 Hallux valgus (acquired), unspecified foot: Secondary | ICD-10-CM

## 2015-04-03 NOTE — Progress Notes (Signed)
Subjective:     Patient ID: Alexis [name], female   DOB: 11/06/1950, 64 y.o.   MRN: [id number]  HPI patient states I'm doing quite a bit better with my right foot with minimal discomfort noted and able to walk distances   Review of Systems     Objective:   Physical Exam Neurovascular status intact muscle strength adequate with good range of motion first MPJ right with no crepitus in the joint wound edges well coapted and good alignment    Assessment:     Doing well post osteotomy right    Plan:     Reviewed x-rays and advised on increase in activity and dispensed anklet to control swelling with instructions on usage

## 2015-04-07 ENCOUNTER — Encounter: Payer: Self-pay | Admitting: Gastroenterology

## 2015-04-21 ENCOUNTER — Ambulatory Visit (INDEPENDENT_AMBULATORY_CARE_PROVIDER_SITE_OTHER): Payer: BLUE CROSS/BLUE SHIELD | Admitting: Internal Medicine

## 2015-04-21 ENCOUNTER — Other Ambulatory Visit (INDEPENDENT_AMBULATORY_CARE_PROVIDER_SITE_OTHER): Payer: BLUE CROSS/BLUE SHIELD

## 2015-04-21 ENCOUNTER — Telehealth: Payer: Self-pay | Admitting: Internal Medicine

## 2015-04-21 ENCOUNTER — Encounter: Payer: Self-pay | Admitting: Internal Medicine

## 2015-04-21 VITALS — BP 132/70 | HR 55 | Ht 64.0 in | Wt 120.4 lb

## 2015-04-21 DIAGNOSIS — E785 Hyperlipidemia, unspecified: Secondary | ICD-10-CM

## 2015-04-21 DIAGNOSIS — M810 Age-related osteoporosis without current pathological fracture: Secondary | ICD-10-CM

## 2015-04-21 DIAGNOSIS — Z0000 Encounter for general adult medical examination without abnormal findings: Secondary | ICD-10-CM

## 2015-04-21 DIAGNOSIS — D509 Iron deficiency anemia, unspecified: Secondary | ICD-10-CM

## 2015-04-21 DIAGNOSIS — Z23 Encounter for immunization: Secondary | ICD-10-CM | POA: Diagnosis not present

## 2015-04-21 DIAGNOSIS — I491 Atrial premature depolarization: Secondary | ICD-10-CM | POA: Diagnosis not present

## 2015-04-21 LAB — CBC WITH DIFFERENTIAL/PLATELET
BASOS PCT: 0.5 % (ref 0.0–3.0)
Basophils Absolute: 0 10*3/uL (ref 0.0–0.1)
Eosinophils Absolute: 0.2 10*3/uL (ref 0.0–0.7)
Eosinophils Relative: 3 % (ref 0.0–5.0)
HEMATOCRIT: 33.2 % — AB (ref 36.0–46.0)
Hemoglobin: 10.6 g/dL — ABNORMAL LOW (ref 12.0–15.0)
LYMPHS PCT: 24.5 % (ref 12.0–46.0)
Lymphs Abs: 1.3 10*3/uL (ref 0.7–4.0)
MCHC: 31.8 g/dL (ref 30.0–36.0)
MCV: 75.4 fl — AB (ref 78.0–100.0)
MONOS PCT: 11.7 % (ref 3.0–12.0)
Monocytes Absolute: 0.6 10*3/uL (ref 0.1–1.0)
NEUTROS ABS: 3.2 10*3/uL (ref 1.4–7.7)
Neutrophils Relative %: 60.3 % (ref 43.0–77.0)
PLATELETS: 216 10*3/uL (ref 150.0–400.0)
RBC: 4.4 Mil/uL (ref 3.87–5.11)
RDW: 16.7 % — AB (ref 11.5–15.5)
WBC: 5.4 10*3/uL (ref 4.0–10.5)

## 2015-04-21 LAB — BASIC METABOLIC PANEL
BUN: 10 mg/dL (ref 6–23)
CALCIUM: 9.8 mg/dL (ref 8.4–10.5)
CO2: 31 mEq/L (ref 19–32)
Chloride: 103 mEq/L (ref 96–112)
Creatinine, Ser: 0.67 mg/dL (ref 0.40–1.20)
GFR: 93.82 mL/min (ref >60.00)
GLUCOSE: 98 mg/dL (ref 70–99)
POTASSIUM: 4.3 meq/L (ref 3.5–5.1)
SODIUM: 141 meq/L (ref 135–145)

## 2015-04-21 LAB — URINALYSIS
Bilirubin Urine: NEGATIVE
HGB URINE DIPSTICK: NEGATIVE
KETONES UR: NEGATIVE
Leukocytes, UA: NEGATIVE
Nitrite: NEGATIVE
Specific Gravity, Urine: 1.01 (ref 1.000–1.030)
Total Protein, Urine: NEGATIVE
URINE GLUCOSE: NEGATIVE
UROBILINOGEN UA: 0.2 (ref 0.0–1.0)
pH: 6 (ref 5.0–8.0)

## 2015-04-21 LAB — LIPID PANEL
CHOLESTEROL: 189 mg/dL (ref 0–200)
HDL: 46.4 mg/dL (ref >39.00)
LDL CALC: 118 mg/dL — AB (ref 0–99)
NONHDL: 143
Total CHOL/HDL Ratio: 4
Triglycerides: 127 mg/dL (ref 0.0–149.0)
VLDL: 25.4 mg/dL (ref 0.0–40.0)

## 2015-04-21 LAB — TSH: TSH: 1.64 u[IU]/mL (ref 0.35–4.50)

## 2015-04-21 LAB — HEPATIC FUNCTION PANEL
ALBUMIN: 4 g/dL (ref 3.5–5.2)
ALT: 13 U/L (ref 0–35)
AST: 18 U/L (ref 0–37)
Alkaline Phosphatase: 69 U/L (ref 39–117)
Bilirubin, Direct: 0.1 mg/dL (ref 0.0–0.3)
TOTAL PROTEIN: 7.2 g/dL (ref 6.0–8.3)
Total Bilirubin: 0.8 mg/dL (ref 0.2–1.2)

## 2015-04-21 MED ORDER — TETANUS-DIPHTH-ACELL PERTUSSIS 5-2.5-18.5 LF-MCG/0.5 IM SUSP
0.5000 mL | Freq: Once | INTRAMUSCULAR | Status: AC
  Administered 2015-04-21: 0.5 mL via INTRAMUSCULAR

## 2015-04-21 NOTE — Telephone Encounter (Signed)
Per ekg: Call patient : Study is unremarkable, no change in recs  Per lab results: Result Note     Call patient : Studies are unremarkable, except for very mild microcytic anemia that will need f/u > needs Iron sat profile  ---  Called spoke with pt. Made aware of above Lab orders placed Nothing further needed

## 2015-04-21 NOTE — Progress Notes (Signed)
Quick Note:  LMTCB ______ 

## 2015-04-21 NOTE — Progress Notes (Signed)
Subjective:    Patient ID: Alexis [name], female    DOB: 1950/08/13    MRN: [id number]   Brief patient profile:  64 yowf with asym hyperlipidemia followed by Alexis [name] for primary care with history of esophageal stricture requiring dilatation and intermittent noct choking sensations better on GERD rx     History of Present Illness    04/15/2014 f/u ov/Alexis [name] re: cpx Chief Complaint  Patient presents with  . Annual Exam    Pt is fasting. She denies any c/o's today.     Some palpitations, still drinking a lot of coffee, not noticeable with exercise but no aerobics  rec To get the most out of exercise, you need to be continuously aware that you are short of breath, but never out of breath, for 30 minutes daily. As you improve, it will actually be easier for you to do the same amount of exercise  in  30 minutes so always push to the [name] where you are short of breath.   Avoid caffeine and call us if you want to schedule a 24 heart monitor  Please see patient coordinator before you leave today  to schedule bone density > no sign change     04/21/2015  f/u ov/Alexis [name] re: hyperlipidemia / palp Chief Complaint  Patient presents with  . Annual Exam    Pt is fasting. She is having trouble hearing- esp out of her left ear-? cerumen impaction.    still occ palpitations not more than once a month x 2-3 min with no tia/ claudication symptoms   Not limited by breathing from desired activities     No obvious day to day or daytime variabilty or assoc chronic cough or cp or chest tightness, subjective wheeze overt sinus   symptoms. No unusual exp hx or h/o childhood pna/ asthma or knowledge of premature birth.  Sleeping ok without nocturnal  or early am exacerbation  of respiratory  c/o's or need for noct saba. Also denies any obvious fluctuation of symptoms with weather or environmental changes or other aggravating or alleviating factors except as outlined above   Current Medications, Allergies, Complete  Past Medical History, Past Surgical History, Family History, and Social History were reviewed in [employer] record.  ROS  The following are not active complaints unless bolded sore throat, dysphagia, dental problems, itching, sneezing,  nasal congestion or excess/ purulent secretions, ear ache,   fever, chills, sweats, unintended wt loss, pleuritic or exertional cp, hemoptysis,  orthopnea pnd or leg swelling, presyncope, palpitations, heartburn, abdominal pain, anorexia, nausea, vomiting, diarrhea  or change in bowel or urinary habits, change in stools or urine, dysuria,hematuria,  rash, arthralgias, visual complaints, headache, numbness weakness or ataxia or problems with walking or coordination,  change in mood/affect or memory.         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  IRRITABLE BOWEL SYNDROME (ICD-564.1)  OSTEOPENIA (ICD-733.90)  - DEXA 07/04/06 Tspine .03, Left hip -.94, L fem Neck -1.4  - DEXA 07/12/08 Tspine -.2 Left F -.7 Right Fem -.4  - DEXA 08/15/10  T spine - 0.3, LF -0.8  RF -0.8  KYPHOSCOLIOSIS, 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  Vital signs reviewed  wt 140 > 135 06/07/08 > 139 07/26/08 > 138 June 24, 2009 > 140 August 09, 2009 > 126 Oct 26, 2009 > 122 July 04, 2010 > 07/26/2011  123> 03/25/2013  125  > 04/15/2014   122 > 04/21/2015     HEENT: nl dentition, turbinates, and orophanx  L ear canal with hard adherent wax ball > removed with minimal underlying excoriation/hem Neck without JVD/Nodes/TM  Lungs clear to A and P bilaterally without cough on insp or exp maneuvers  RRR no s3 or murmur or increase in P2  Abd soft and benign with nl excursion in the supine position. No bruits or organomegaly  Ext warm without calf tenderness, cyanosis clubbing or edema MS nl gait, no joint deformities or restrictions Neuro intact sensorium, no motor deficits Skin no lesions GU /rectal per Alexis [name] ordered/ reviewed:      Chemistry      Component Value Date/Time   NA 141 04/21/2015 0932   K 4.3 04/21/2015 0932   CL 103 04/21/2015 0932   CO2 31 04/21/2015 0932   BUN 10 04/21/2015 0932   CREATININE 0.67 04/21/2015 0932      Component Value Date/Time   CALCIUM 9.8 04/21/2015 0932   ALKPHOS 69 04/21/2015 0932   AST 18 04/21/2015 0932   ALT 13 04/21/2015 0932   BILITOT 0.8 04/21/2015 0932        Lab Results  Component Value Date   WBC 5.4 04/21/2015   HGB 10.6* 04/21/2015   HCT 33.2* 04/21/2015   MCV 75.4* 04/21/2015   PLT 216.0 04/21/2015        Lab Results  Component Value Date   TSH 1.64 04/21/2015                Assessment & [name]:

## 2015-04-21 NOTE — Patient Instructions (Signed)
Please remember to go to the lab department downstairs for your tests - we will call you with the results when they are available.  Tetanus shot today   Please schedule a follow up visit in 12 months but call sooner if needed.

## 2015-04-22 ENCOUNTER — Other Ambulatory Visit (INDEPENDENT_AMBULATORY_CARE_PROVIDER_SITE_OTHER): Payer: BLUE CROSS/BLUE SHIELD

## 2015-04-22 ENCOUNTER — Encounter: Payer: Self-pay | Admitting: Internal Medicine

## 2015-04-22 DIAGNOSIS — D509 Iron deficiency anemia, unspecified: Secondary | ICD-10-CM

## 2015-04-22 LAB — IBC PANEL
IRON: 36 ug/dL — AB (ref 42–145)
SATURATION RATIOS: 7.5 % — AB (ref 20.0–50.0)
TRANSFERRIN: 344 mg/dL (ref 212.0–360.0)

## 2015-04-22 NOTE — Assessment & Plan Note (Signed)
-   mammos neg 09/10/13 and 09/17/14  - declined flu 04/15/14 and pneumovax and 04/21/2015

## 2015-04-22 NOTE — Assessment & Plan Note (Addendum)
H/o fe def  1999 - repeat fe studies rec 04/21/2015   No overt bleeding but likely occult GI source, will confirm fe def and refer back to GI

## 2015-04-22 NOTE — Assessment & Plan Note (Signed)
-   Target LDL < 160 in absence of risk factors  Lab Results  Component Value Date   CHOL 189 04/21/2015   HDL 46.40 04/21/2015   LDLCALC 118* 04/21/2015   LDLDIRECT 151.4 07/04/2010   TRIG 127.0 04/21/2015   CHOLHDL 4 04/21/2015     Adequate control on present rx, reviewed > no change in rx needed

## 2015-04-22 NOTE — Assessment & Plan Note (Signed)
-   DEXA 07/04/06 Tspine .03, Left hip -.94, L fem Neck -1.4  - DEXA 07/12/08 Tspine -.2 Left F -.7 Right Fem -.4  - DEXA 08/15/10  T spine - 0.3, LF -0.8  RF -0.8  - DEXA 04/16/14 T score is -0.9 at the femoral neck- in the normal range Dual hip measurement is -1.6 - in the osteopenia range Spine score may be falsely elevated due to scoliosis   Discussed in detail all the  indications, usual  risks and alternatives  relative to the benefits with patient who agrees to proceed with conservative f/u as outlined with calcium, D and wt bearing ex

## 2015-04-22 NOTE — Assessment & Plan Note (Signed)
Remote dx MVP requiring dental prophylaxis per cards    - see EKG 04/15/14 - resolved ekg 04/21/2015   Only occuring once or twice a month, self limited, never with ex, no w/u needed at this point

## 2015-04-25 ENCOUNTER — Other Ambulatory Visit: Payer: Self-pay | Admitting: Internal Medicine

## 2015-04-25 NOTE — Progress Notes (Signed)
Quick Note:  LMTCB ______ 

## 2015-04-26 ENCOUNTER — Telehealth: Payer: Self-pay | Admitting: Internal Medicine

## 2015-04-26 MED ORDER — POLYSACCHARIDE IRON COMPLEX 150 MG PO CAPS: 150.0000 mg | ORAL_CAPSULE | Freq: Every day | ORAL | Status: DC

## 2015-04-26 NOTE — Telephone Encounter (Signed)
Notes Recorded by Tanda Rockers, MD on 04/22/2015 at 2:45 PM Call patient : Study is c/w mild fe def so unless she's been giving blood she must be losing it somewhere. rec stool cards next then consider gi referral or can just be referred directly to gi to sort out if she desires ------------------------------ Spoke with pt, states that she hasn't given blood since spring but she had foot surgery in September.  (R bunionectomy and hammertoe repair) before pt considers a GI referral she wants to know if this could be the cause of the mild iron deficiency.   MW please advise.  Thanks!

## 2015-04-26 NOTE — Telephone Encounter (Signed)
Called and spoke to pt. Informed her of the recs per MW. Rx sent to pharmacy. Pt requested stool card be mailed to home address. Address verified. Stool card placed in out going mail. Appt made with MW in 07/2015. Pt verbalized understanding and denied any further questions or concerns at this time.

## 2015-04-26 NOTE — Telephone Encounter (Signed)
Usually not - ok to do stool cards and if neg can put off gi eval until after the holidays and start nuiron 150 mg one daily but either way need ov 3 m

## 2015-05-06 ENCOUNTER — Other Ambulatory Visit: Payer: BLUE CROSS/BLUE SHIELD

## 2015-05-06 ENCOUNTER — Telehealth: Payer: Self-pay | Admitting: Internal Medicine

## 2015-05-06 ENCOUNTER — Other Ambulatory Visit: Payer: Self-pay | Admitting: Internal Medicine

## 2015-05-06 DIAGNOSIS — D509 Iron deficiency anemia, unspecified: Secondary | ICD-10-CM

## 2015-05-06 NOTE — Telephone Encounter (Signed)
Order for hemoccult placed

## 2015-05-20 ENCOUNTER — Ambulatory Visit (INDEPENDENT_AMBULATORY_CARE_PROVIDER_SITE_OTHER): Payer: BLUE CROSS/BLUE SHIELD | Admitting: Podiatry

## 2015-05-20 ENCOUNTER — Ambulatory Visit (INDEPENDENT_AMBULATORY_CARE_PROVIDER_SITE_OTHER): Payer: BLUE CROSS/BLUE SHIELD

## 2015-05-20 DIAGNOSIS — M21629 Bunionette of unspecified foot: Secondary | ICD-10-CM

## 2015-05-20 DIAGNOSIS — Z9889 Other specified postprocedural states: Secondary | ICD-10-CM

## 2015-05-20 DIAGNOSIS — M2010 Hallux valgus (acquired), unspecified foot: Secondary | ICD-10-CM

## 2015-05-23 NOTE — Progress Notes (Signed)
Subjective:     Patient ID: Alexis [name], female   DOB: 12/31/1950, 64 y.o.   MRN: [id number]  HPI patient states I been doing well with my right foot with discomfort if him on it too much but overall able to do a lot of activities   Review of Systems     Objective:   Physical Exam Neurovascular status intact muscle strength adequate with significant diminishment of edema and pain in the right foot secondary to extensive forefoot surgery    Assessment:     Doing well post structural correction of the right foot with pins and screws in place and no indications of abnormalities    Plan:     H&P conditions reviewed with patient and at this time I'm allowing patient to return to normal activities. Reappoint to recheck

## 2015-07-06 ENCOUNTER — Telehealth: Payer: Self-pay | Admitting: Internal Medicine

## 2015-07-06 NOTE — Telephone Encounter (Signed)
lmtcb x1 for pt. 

## 2015-07-06 NOTE — Telephone Encounter (Signed)
No need to make two trips, let's just sort out what she really needs when she gets here.

## 2015-07-06 NOTE — Telephone Encounter (Signed)
Spoke with pt,aware of recs.  Nothing further needed.  

## 2015-07-06 NOTE — Telephone Encounter (Signed)
Spoke with pt, states she has a rov on 07/29/15 to follow up on her being on Iron-pt wants to know if she just needs to come in to have her labs drawn, or if she needs to have her labs checked a couple of days prior to her scheduled rov.  Pt also scheduled for 04/2016 cpx.    MW please advise.  Thanks.

## 2015-07-06 NOTE — Telephone Encounter (Signed)
[phone] calling back

## 2015-07-20 ENCOUNTER — Other Ambulatory Visit: Payer: Self-pay | Admitting: Internal Medicine

## 2015-07-29 ENCOUNTER — Ambulatory Visit (INDEPENDENT_AMBULATORY_CARE_PROVIDER_SITE_OTHER): Payer: BLUE CROSS/BLUE SHIELD | Admitting: Internal Medicine

## 2015-07-29 ENCOUNTER — Encounter: Payer: Self-pay | Admitting: Internal Medicine

## 2015-07-29 ENCOUNTER — Other Ambulatory Visit (INDEPENDENT_AMBULATORY_CARE_PROVIDER_SITE_OTHER): Payer: BLUE CROSS/BLUE SHIELD

## 2015-07-29 VITALS — BP 112/70 | HR 60 | Ht 64.0 in | Wt 121.0 lb

## 2015-07-29 DIAGNOSIS — D509 Iron deficiency anemia, unspecified: Secondary | ICD-10-CM | POA: Diagnosis not present

## 2015-07-29 LAB — IBC PANEL
Iron: 30 ug/dL — ABNORMAL LOW (ref 42–145)
SATURATION RATIOS: 6 % — AB (ref 20.0–50.0)
Transferrin: 356 mg/dL (ref 212.0–360.0)

## 2015-07-29 NOTE — Progress Notes (Signed)
Subjective:    Patient ID: Alexis [name], female    DOB: 06-Jan-1951    MRN: [id number]   Brief patient profile:  74 yowf with asym hyperlipidemia followed by [name] for primary care with history of esophageal stricture requiring dilatation and intermittent noct choking sensations better on GERD rx     History of Present Illness    04/15/2014 f/u ov/[name] re: cpx Chief Complaint  Patient presents with  . Annual Exam    Pt is fasting. She denies any c/o's today.     Some palpitations, still drinking a lot of coffee, not noticeable with exercise but no aerobics  rec To get the most out of exercise, you need to be continuously aware that you are short of breath, but never out of breath, for 30 minutes daily. As you improve, it will actually be easier for you to do the same amount of exercise  in  30 minutes so always push to the level where you are short of breath.   Avoid caffeine and call us if you want to schedule a 24 heart monitor  Please see patient coordinator before you leave today  to schedule bone density > no sign change     04/21/2015  f/u ov/[name] re: hyperlipidemia / palp Chief Complaint  Patient presents with  . Annual Exam    Pt is fasting. She is having trouble hearing- esp out of her left ear-? cerumen impaction.    still occ palpitations not more than once a month x 2-3 min with no tia/ claudication symptoms rec microcyctic anemia    07/29/2015  f/u ov/[name] Genet re:  fe anemia Chief Complaint  Patient presents with  . Follow-up    Pt states doing well overall. She has occ palpitations. No new co's.     Activity  tol good but no aerobics   No obvious day to day or daytime variabilty or assoc chronic cough or cp or chest tightness, subjective wheeze overt sinus   symptoms. No unusual exp hx or h/o childhood pna/ asthma or knowledge of premature birth.  Sleeping ok without nocturnal  or early am exacerbation  of respiratory  c/o's or need for noct saba. Also denies any  obvious fluctuation of symptoms with weather or environmental changes or other aggravating or alleviating factors except as outlined above   Current Medications, Allergies, Complete Past Medical History, Past Surgical History, Family History, and Social History were reviewed in [facility] record.  ROS  The following are not active complaints unless bolded sore throat, dysphagia, dental problems, itching, sneezing,  nasal congestion or excess/ purulent secretions, ear ache,   fever, chills, sweats, unintended wt loss, pleuritic or exertional cp, hemoptysis,  orthopnea pnd or leg swelling, presyncope, palpitations, heartburn, abdominal pain, anorexia, nausea, vomiting, diarrhea  or change in bowel or urinary habits, change in stools or urine, dysuria,hematuria,  rash, arthralgias, visual complaints, headache, numbness weakness or ataxia or problems with walking or coordination,  change in mood/affect or memory.         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  IRRITABLE BOWEL SYNDROME (ICD-564.1)  OSTEOPENIA (ICD-733.90)  - DEXA 07/04/06 Tspine .03, Left hip -.94, L fem Neck -1.4  - DEXA 07/12/08 Tspine -.2 Left  F -.7 Right Fem -.4  - DEXA 08/15/10  T spine - 0.3, LF -0.8  RF -0.8  KYPHOSCOLIOSIS, 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  Vital signs reviewed  wt 140 > 135 06/07/08 > 139 07/26/08 > 138 June 24, 2009 > 140 August 09, 2009 > 126 Oct 26, 2009 > 122 July 04, 2010 > 07/26/2011  123> 03/25/2013  125  > 04/15/2014   122 > 04/21/2015  > 07/29/2015  121   HEENT: nl dentition, turbinates, and orophanx    Neck without JVD/Nodes/TM  Lungs clear to A and P bilaterally without cough on insp or exp maneuvers  RRR no s3 or murmur or increase in P2  Abd soft and benign with nl excursion in the supine position. No bruits or organomegaly  Ext warm without calf tenderness, cyanosis clubbing or edema MS nl gait, no joint deformities or restrictions Neuro intact sensorium, no motor deficits Skin no lesions                 Labs ordered/ reviewed:      Chemistry      Component Value Date/Time   NA 141 04/21/2015 0932   K 4.3 04/21/2015 0932   CL 103 04/21/2015 0932   CO2 31 04/21/2015 0932   BUN 10 04/21/2015 0932   CREATININE 0.67 04/21/2015 0932      Component Value Date/Time   CALCIUM 9.8 04/21/2015 0932   ALKPHOS 69 04/21/2015 0932   AST 18 04/21/2015 0932   ALT 13 04/21/2015 0932   BILITOT 0.8 04/21/2015 0932        Lab Results  Component Value Date   WBC 7.2 07/29/2015   HGB 10.8* 07/29/2015   HCT 35.3* 07/29/2015   MCV 82.9 07/29/2015   PLT 286.0 07/29/2015                           Assessment & Plan:

## 2015-07-29 NOTE — Patient Instructions (Addendum)
Please remember to go to the lab  department downstairs for your tests - we will call you with the results when they are available.    Return for cpx 04/20/16 or after for cpx

## 2015-07-31 LAB — CBC WITH DIFFERENTIAL/PLATELET
BASOS ABS: 0 10*3/uL (ref 0.0–0.1)
Basophils Relative: 0.6 % (ref 0.0–3.0)
EOS PCT: 1.9 % (ref 0.0–5.0)
Eosinophils Absolute: 0.1 10*3/uL (ref 0.0–0.7)
HEMATOCRIT: 35.3 % — AB (ref 36.0–46.0)
Hemoglobin: 10.8 g/dL — ABNORMAL LOW (ref 12.0–15.0)
LYMPHS ABS: 1.7 10*3/uL (ref 0.7–4.0)
LYMPHS PCT: 23 % (ref 12.0–46.0)
MCHC: 30.6 g/dL (ref 30.0–36.0)
MCV: 82.9 fl (ref 78.0–100.0)
MONOS PCT: 9.6 % (ref 3.0–12.0)
Monocytes Absolute: 0.7 10*3/uL (ref 0.1–1.0)
NEUTROS ABS: 4.7 10*3/uL (ref 1.4–7.7)
NEUTROS PCT: 64.9 % (ref 43.0–77.0)
PLATELETS: 286 10*3/uL (ref 150.0–400.0)
RBC: 4.26 Mil/uL (ref 3.87–5.11)
RDW: 15.9 % — ABNORMAL HIGH (ref 11.5–15.5)
WBC: 7.2 10*3/uL (ref 4.0–10.5)

## 2015-08-01 ENCOUNTER — Other Ambulatory Visit: Payer: Self-pay | Admitting: Internal Medicine

## 2015-08-01 MED ORDER — POLYSACCHARIDE IRON COMPLEX 150 MG PO CAPS: 150.0000 mg | ORAL_CAPSULE | Freq: Every day | ORAL | Status: DC

## 2015-08-01 NOTE — Progress Notes (Signed)
Quick Note:  Spoke with pt and notified of results per Dr. Wert. Pt verbalized understanding and denied any questions.  ______ 

## 2015-08-03 NOTE — Assessment & Plan Note (Addendum)
fe indices down/ pt still donating blood which I have asked her to forgo and recheck fe / cbc in another 3 months  > may need to look at GI tract if not corrected with this approach   I had an extended discussion with the patient reviewing all relevant studies completed to date and  lasting 10 minutes of a 15 minute visit    Each maintenance medication was reviewed in detail including most importantly the difference between maintenance and prns and under what circumstances the prns are to be triggered using an action plan format that is not reflected in the computer generated alphabetically organized AVS.    Please see instructions for details which were reviewed in writing and the patient given a copy highlighting the part that I personally wrote and discussed at today's ov.

## 2015-10-21 ENCOUNTER — Other Ambulatory Visit: Payer: Self-pay

## 2015-10-21 DIAGNOSIS — Z1231 Encounter for screening mammogram for malignant neoplasm of breast: Secondary | ICD-10-CM

## 2015-10-28 ENCOUNTER — Ambulatory Visit (INDEPENDENT_AMBULATORY_CARE_PROVIDER_SITE_OTHER): Payer: BLUE CROSS/BLUE SHIELD | Admitting: Internal Medicine

## 2015-10-28 ENCOUNTER — Encounter: Payer: Self-pay | Admitting: Internal Medicine

## 2015-10-28 ENCOUNTER — Other Ambulatory Visit (INDEPENDENT_AMBULATORY_CARE_PROVIDER_SITE_OTHER): Payer: BLUE CROSS/BLUE SHIELD

## 2015-10-28 ENCOUNTER — Other Ambulatory Visit: Payer: Self-pay | Admitting: Internal Medicine

## 2015-10-28 VITALS — BP 140/80 | HR 68 | Ht 65.25 in | Wt 119.0 lb

## 2015-10-28 DIAGNOSIS — D509 Iron deficiency anemia, unspecified: Secondary | ICD-10-CM | POA: Diagnosis not present

## 2015-10-28 DIAGNOSIS — M5410 Radiculopathy, site unspecified: Secondary | ICD-10-CM

## 2015-10-28 LAB — CBC WITH DIFFERENTIAL/PLATELET
BASOS PCT: 1.3 % (ref 0.0–3.0)
Basophils Absolute: 0.1 10*3/uL (ref 0.0–0.1)
EOS ABS: 0.1 10*3/uL (ref 0.0–0.7)
Eosinophils Relative: 1.8 % (ref 0.0–5.0)
HCT: 34.9 % — ABNORMAL LOW (ref 36.0–46.0)
Hemoglobin: 11.7 g/dL — ABNORMAL LOW (ref 12.0–15.0)
LYMPHS ABS: 1.6 10*3/uL (ref 0.7–4.0)
Lymphocytes Relative: 20.8 % (ref 12.0–46.0)
MCHC: 33.6 g/dL (ref 30.0–36.0)
MCV: 78.4 fl (ref 78.0–100.0)
MONO ABS: 0.8 10*3/uL (ref 0.1–1.0)
Monocytes Relative: 10.4 % (ref 3.0–12.0)
NEUTROS ABS: 5.1 10*3/uL (ref 1.4–7.7)
Neutrophils Relative %: 65.7 % (ref 43.0–77.0)
PLATELETS: 230 10*3/uL (ref 150.0–400.0)
RBC: 4.46 Mil/uL (ref 3.87–5.11)
RDW: 16.3 % — AB (ref 11.5–15.5)
WBC: 7.8 10*3/uL (ref 4.0–10.5)

## 2015-10-28 LAB — IBC PANEL
IRON: 57 ug/dL (ref 42–145)
Saturation Ratios: 11.6 % — ABNORMAL LOW (ref 20.0–50.0)
TRANSFERRIN: 351 mg/dL (ref 212.0–360.0)

## 2015-10-28 MED ORDER — POLYSACCHARIDE IRON COMPLEX 150 MG PO CAPS: 150.0000 mg | ORAL_CAPSULE | Freq: Every day | ORAL | Status: DC

## 2015-10-28 NOTE — Progress Notes (Signed)
Quick Note:  Spoke with pt and notified of results per Dr. Wert. Pt verbalized understanding and denied any questions.  ______ 

## 2015-10-28 NOTE — Patient Instructions (Signed)
Please see patient coordinator before you leave today  to schedule neurosurgery  Take your anti-inflammatory with meals as per bottle and share it with neurosurgery so they don't duplicate your treatment  Please remember to go to the lab  department downstairs for your tests - we will call you with the results when they are available with further instructions on iron therapy.  Please schedule a follow up visit in 3 months but call sooner if needed

## 2015-10-28 NOTE — Progress Notes (Signed)
Subjective:    Patient ID: Alexis [name], female    DOB: Jul 13, 1950    MRN: [id number]   Brief patient profile:  68 yowf with asym hyperlipidemia followed by [name] for primary care with history of esophageal stricture requiring dilatation and intermittent noct choking sensations better on GERD rx     History of Present Illness    04/15/2014 f/u ov/Ewan Grau re: cpx Chief Complaint  Patient presents with  . Annual Exam    Pt is fasting. She denies any c/o's today.     Some palpitations, still drinking a lot of coffee, not noticeable with exercise but no aerobics  rec To get the most out of exercise, you need to be continuously aware that you are short of breath, but never out of breath, for 30 minutes daily. As you improve, it will actually be easier for you to do the same amount of exercise  in  30 minutes so always push to the level where you are short of breath.   Avoid caffeine and call us if you want to schedule a 24 heart monitor  Please see patient coordinator before you leave today  to schedule bone density > no sign change     04/21/2015  f/u ov/Traci Gafford re: hyperlipidemia / palp Chief Complaint  Patient presents with  . Annual Exam    Pt is fasting. She is having trouble hearing- esp out of her left ear-? cerumen impaction.    still occ palpitations not more than once a month x 2-3 min with no tia/ claudication symptoms rec microcyctic anemia   07/29/2015  f/u ov/Seydou Hearns re:  fe anemia Chief Complaint  Patient presents with  . Follow-up    Pt states doing well overall. She has occ palpitations. No new co's.   Activity  tol good but no aerobics  microcyctic anemia > fe rx   10/28/2015  f/u ov/Roel Douthat re: anemia/ new radicular L leg pain  Chief Complaint  Patient presents with  . Follow-up    Pt c/o left side back pain that radiates all the way down her leg. She states pain comes and goes and started a few months. She states pain is worse when she lies down and after she sits for  a while and then starts walking.   back pain rad to L knee/foot x 4 months, takes nsaids at hs / no numbness/ weakness or h/o rash using lots of nsaids including hs dose  No obvious day to day or daytime variabilty or assoc chronic cough or cp or chest tightness, subjective wheeze overt sinus   symptoms. No unusual exp hx or h/o childhood pna/ asthma or knowledge of premature birth.  Sleeping ok without nocturnal  or early am exacerbation  of respiratory  c/o's or need for noct saba. Also denies any obvious fluctuation of symptoms with weather or environmental changes or other aggravating or alleviating factors except as outlined above   Current Medications, Allergies, Complete Past Medical History, Past Surgical History, Family History, and Social History were reviewed in [facility] record.  ROS  The following are not active complaints unless bolded sore throat, dysphagia, dental problems, itching, sneezing,  nasal congestion or excess/ purulent secretions, ear ache,   fever, chills, sweats, unintended wt loss, pleuritic or exertional cp, hemoptysis,  orthopnea pnd or leg swelling, presyncope, palpitations, heartburn, abdominal pain, anorexia, nausea, vomiting, diarrhea  or change in bowel or urinary habits, change in stools or urine, dysuria,hematuria,  rash, arthralgias, visual  complaints, headache, numbness weakness or ataxia or problems with walking or coordination,  change in mood/affect or memory.         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  IRRITABLE BOWEL SYNDROME (ICD-564.1)  OSTEOPENIA (ICD-733.90)  - DEXA 07/04/06 Tspine .03, Left hip -.94, L fem  Neck -1.4  - DEXA 07/12/08 Tspine -.2 Left F -.7 Right Fem -.4  - DEXA 08/15/10  T spine - 0.3, LF -0.8  RF -0.8  KYPHOSCOLIOSIS, 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  Vital signs reviewed  wt 140 > 135 06/07/08 > 139 07/26/08 > 138 June 24, 2009 > 140 August 09, 2009 > 126 Oct 26, 2009 > 122 July 04, 2010 > 07/26/2011  123> 03/25/2013  125  > 04/15/2014   122 > 04/21/2015  > 07/29/2015  121 >  10/28/2015 119   HEENT: nl dentition, turbinates, and orophanx    Neck without JVD/Nodes/TM  Lungs clear to A and P bilaterally without cough on insp or exp maneuvers  RRR no s3 or murmur or increase in P2  Abd soft and benign with nl excursion in the supine position. No bruits or organomegaly  Ext warm without calf tenderness, cyanosis clubbing or edema MS nl gait, no joint deformities or restrictions/ neg SLR on L, from L hip Neuro intact sensorium, no motor deficits- L leg nl sensation/ strength/reflexes/ heal and toe standing.  Skin no lesions           .labs ordered/ reviewed  Lab Results  Component Value Date   WBC 7.8 10/28/2015   HGB 11.7* 10/28/2015   HCT 34.9* 10/28/2015   MCV 78.4 10/28/2015   PLT 230.0 10/28/2015                      Assessment & Plan:

## 2015-10-29 NOTE — Assessment & Plan Note (Signed)
H/o fe def  1999, recurred early 2017 while donating blood - rx NuIron  07/29/15 >>>   Fe 57/ TIBC  351 and sats up to 11.6    Lab Results  Component Value Date   HGB 11.7* 10/28/2015   HGB 10.8* 07/29/2015   HGB 10.6* 04/21/2015    Improving on Fe/ no obvious gib despite using too much nsaids on empty stomach > rec continue fe and change nsaids rx / f/u in 3 m   I had an extended discussion with the patient reviewing all relevant studies completed to date and  lasting 15 to 20 minutes of a 25 minute visit    Each maintenance medication was reviewed in detail including most importantly the difference between maintenance and prns and under what circumstances the prns are to be triggered using an action plan format that is not reflected in the computer generated alphabetically organized AVS.    Please see instructions for details which were reviewed in writing and the patient given a copy highlighting the part that I personally wrote and discussed at today's ov.

## 2015-10-29 NOTE — Assessment & Plan Note (Signed)
Referred to NS 10/28/2015 >>>   No evidence of muscle weakness Alexis [name] def at this point / advised not to exceed doses of nsaids otc and take only with meals

## 2015-11-04 ENCOUNTER — Ambulatory Visit
Admission: RE | Admit: 2015-11-04 | Discharge: 2015-11-04 | Disposition: A | Discharge status: Home / Self Care | Payer: BLUE CROSS/BLUE SHIELD | Source: Ambulatory Visit

## 2015-11-04 DIAGNOSIS — Z1231 Encounter for screening mammogram for malignant neoplasm of breast: Secondary | ICD-10-CM

## 2015-11-08 ENCOUNTER — Other Ambulatory Visit: Payer: Self-pay | Admitting: Internal Medicine

## 2015-11-08 DIAGNOSIS — R928 Other abnormal and inconclusive findings on diagnostic imaging of breast: Secondary | ICD-10-CM

## 2015-11-11 ENCOUNTER — Other Ambulatory Visit: Payer: Self-pay

## 2015-11-11 ENCOUNTER — Other Ambulatory Visit: Payer: Self-pay | Admitting: Internal Medicine

## 2015-11-11 DIAGNOSIS — R928 Other abnormal and inconclusive findings on diagnostic imaging of breast: Secondary | ICD-10-CM

## 2015-11-15 ENCOUNTER — Other Ambulatory Visit: Payer: Self-pay | Admitting: Internal Medicine

## 2015-11-15 ENCOUNTER — Ambulatory Visit
Admission: RE | Admit: 2015-11-15 | Discharge: 2015-11-15 | Disposition: A | Discharge status: Home / Self Care | Payer: BLUE CROSS/BLUE SHIELD | Source: Ambulatory Visit | Attending: Internal Medicine | Admitting: Internal Medicine

## 2015-11-15 DIAGNOSIS — N63 Unspecified lump in unspecified breast: Secondary | ICD-10-CM | POA: Insufficient documentation

## 2015-11-15 DIAGNOSIS — R928 Other abnormal and inconclusive findings on diagnostic imaging of breast: Secondary | ICD-10-CM

## 2015-11-15 DIAGNOSIS — R921 Mammographic calcification found on diagnostic imaging of breast: Secondary | ICD-10-CM

## 2015-11-15 DIAGNOSIS — N6310 Unspecified lump in the right breast, unspecified quadrant: Secondary | ICD-10-CM

## 2015-11-16 ENCOUNTER — Ambulatory Visit
Admission: RE | Admit: 2015-11-16 | Discharge: 2015-11-16 | Disposition: A | Discharge status: Home / Self Care | Payer: BLUE CROSS/BLUE SHIELD | Source: Ambulatory Visit | Attending: Internal Medicine | Admitting: Internal Medicine

## 2015-11-16 ENCOUNTER — Other Ambulatory Visit: Payer: Self-pay | Admitting: Internal Medicine

## 2015-11-16 DIAGNOSIS — N63 Unspecified lump in unspecified breast: Secondary | ICD-10-CM

## 2015-11-16 DIAGNOSIS — C50919 Malignant neoplasm of unspecified site of unspecified female breast: Secondary | ICD-10-CM

## 2015-11-16 DIAGNOSIS — N6310 Unspecified lump in the right breast, unspecified quadrant: Secondary | ICD-10-CM

## 2015-11-16 HISTORY — PX: BREAST BIOPSY: SHX20

## 2015-11-16 HISTORY — DX: Malignant neoplasm of unspecified site of unspecified female breast: C50.919

## 2015-11-17 ENCOUNTER — Telehealth: Payer: Self-pay | Admitting: *Deleted

## 2015-11-17 DIAGNOSIS — Z170 Estrogen receptor positive status [ER+]: Secondary | ICD-10-CM

## 2015-11-17 DIAGNOSIS — C50411 Malignant neoplasm of upper-outer quadrant of right female breast: Secondary | ICD-10-CM

## 2015-11-17 NOTE — Telephone Encounter (Signed)
Confirmed [facility] for 11/23/15 at 815am .  Instructions and contact information given.

## 2015-11-18 ENCOUNTER — Telehealth: Payer: Self-pay | Admitting: *Deleted

## 2015-11-18 NOTE — Telephone Encounter (Signed)
Mailed clinic packet to pt.  

## 2015-11-23 ENCOUNTER — Ambulatory Visit
Admission: RE | Admit: 2015-11-23 | Discharge: 2015-11-23 | Disposition: A | Discharge status: Home / Self Care | Payer: BLUE CROSS/BLUE SHIELD | Source: Ambulatory Visit | Attending: Radiation Oncology | Admitting: Radiation Oncology

## 2015-11-23 ENCOUNTER — Encounter: Payer: Self-pay | Admitting: *Deleted

## 2015-11-23 ENCOUNTER — Other Ambulatory Visit: Payer: Self-pay | Admitting: *Deleted

## 2015-11-23 ENCOUNTER — Ambulatory Visit
Admission: RE | Admit: 2015-11-23 | Discharge: 2015-11-23 | Disposition: A | Discharge status: Home / Self Care | Payer: BLUE CROSS/BLUE SHIELD | Source: Ambulatory Visit | Attending: Surgery | Admitting: Surgery

## 2015-11-23 ENCOUNTER — Encounter: Payer: Self-pay | Admitting: Oncology

## 2015-11-23 ENCOUNTER — Encounter: Payer: Self-pay | Admitting: Physical Therapy

## 2015-11-23 ENCOUNTER — Encounter: Payer: Self-pay | Admitting: Skilled Nursing Facility1

## 2015-11-23 ENCOUNTER — Telehealth: Payer: Self-pay | Admitting: Oncology

## 2015-11-23 ENCOUNTER — Other Ambulatory Visit (HOSPITAL_BASED_OUTPATIENT_CLINIC_OR_DEPARTMENT_OTHER): Payer: BLUE CROSS/BLUE SHIELD

## 2015-11-23 ENCOUNTER — Ambulatory Visit (HOSPITAL_BASED_OUTPATIENT_CLINIC_OR_DEPARTMENT_OTHER): Payer: BLUE CROSS/BLUE SHIELD | Admitting: Oncology

## 2015-11-23 ENCOUNTER — Ambulatory Visit: Payer: BLUE CROSS/BLUE SHIELD | Attending: Surgery | Admitting: Physical Therapy

## 2015-11-23 VITALS — BP 174/73 | HR 67 | Temp 98.1°F | Resp 20 | Ht 65.25 in | Wt 118.8 lb

## 2015-11-23 DIAGNOSIS — C50411 Malignant neoplasm of upper-outer quadrant of right female breast: Secondary | ICD-10-CM

## 2015-11-23 DIAGNOSIS — M859 Disorder of bone density and structure, unspecified: Secondary | ICD-10-CM | POA: Diagnosis not present

## 2015-11-23 DIAGNOSIS — R293 Abnormal posture: Secondary | ICD-10-CM | POA: Diagnosis present

## 2015-11-23 LAB — CBC WITH DIFFERENTIAL/PLATELET
BASO%: 0.2 % (ref 0.0–2.0)
Basophils Absolute: 0 10*3/uL (ref 0.0–0.1)
EOS%: 1.4 % (ref 0.0–7.0)
Eosinophils Absolute: 0.1 10*3/uL (ref 0.0–0.5)
HEMATOCRIT: 36.1 % (ref 34.8–46.6)
HGB: 11.6 g/dL (ref 11.6–15.9)
LYMPH#: 1.1 10*3/uL (ref 0.9–3.3)
LYMPH%: 25.6 % (ref 14.0–49.7)
MCH: 26.5 pg (ref 25.1–34.0)
MCHC: 32.1 g/dL (ref 31.5–36.0)
MCV: 82.6 fL (ref 79.5–101.0)
MONO#: 0.6 10*3/uL (ref 0.1–0.9)
MONO%: 12.8 % (ref 0.0–14.0)
NEUT%: 60 % (ref 38.4–76.8)
NEUTROS ABS: 2.6 10*3/uL (ref 1.5–6.5)
PLATELETS: 183 10*3/uL (ref 145–400)
RBC: 4.37 10*6/uL (ref 3.70–5.45)
RDW: 14.7 % — ABNORMAL HIGH (ref 11.2–14.5)
WBC: 4.4 10*3/uL (ref 3.9–10.3)

## 2015-11-23 LAB — COMPREHENSIVE METABOLIC PANEL
ALT: 16 U/L (ref 0–55)
ANION GAP: 8 meq/L (ref 3–11)
AST: 20 U/L (ref 5–34)
Albumin: 3.6 g/dL (ref 3.5–5.0)
Alkaline Phosphatase: 82 U/L (ref 40–150)
BILIRUBIN TOTAL: 0.4 mg/dL (ref 0.20–1.20)
BUN: 14.7 mg/dL (ref 7.0–26.0)
CALCIUM: 9.3 mg/dL (ref 8.4–10.4)
CO2: 28 meq/L (ref 22–29)
CREATININE: 0.8 mg/dL (ref 0.6–1.1)
Chloride: 106 mEq/L (ref 98–109)
EGFR: 78 mL/min/{1.73_m2} — ABNORMAL LOW (ref >90)
Glucose: 73 mg/dl (ref 70–140)
Potassium: 3.6 mEq/L (ref 3.5–5.1)
Sodium: 143 mEq/L (ref 136–145)
TOTAL PROTEIN: 7 g/dL (ref 6.4–8.3)

## 2015-11-23 MED ORDER — GADOBENATE DIMEGLUMINE 529 MG/ML IV SOLN
10.0000 mL | Freq: Once | INTRAVENOUS | Status: AC | PRN
  Administered 2015-11-23: 10 mL via INTRAVENOUS

## 2015-11-23 NOTE — Therapy (Signed)
[place], [place], [address] Phone: [phone]   Fax:  [phone]  Physical Therapy Evaluation  Patient Details  Name: Alexis [name] MRN: [id number] Date of Birth: 09-Oct-1950 Referring Provider: Dr. Alphonsa Overall  Encounter Date: 11/23/2015      PT End of Session - 11/23/15 1513    Visit Number 1   Number of Visits 1   PT Start Time 2751   PT Stop Time 1207   PT Time Calculation (min) 22 min   Activity Tolerance Patient tolerated treatment well   Behavior During Therapy [facility] for tasks assessed/performed      Past Medical History  Diagnosis Date  . GERD (gastroesophageal reflux disease)   . Vertigo   . MVP (mitral valve prolapse)   . Kyphosis of thoracic region   . Chronic fatigue   . Osteopenia   . Hemangioma   . Hyperlipidemia   . Iron deficiency anemia   . Cough   . IBS (irritable bowel syndrome)   . Breast cancer [facility])     Past Surgical History  Procedure Laterality Date  . [place]  2003    s/p embolization per Dr. Estanislado Pandy  . Tubal ligation    . Tonsillectomy    . Appendectomy      There were no vitals filed for this visit.       Subjective Assessment - 11/23/15 1452    Subjective Patient reports she is here today being seen by her medical team today for her newly diagnosed right breast cancer.    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  It measures 1.2 cm, is ER/PR positive, HER2 negative and has a Ki67 of 5%. Her multidisciplinary medical team met prior to her being seen to determine a recommended treatment plan.   Patient Stated Goals Reduce lymphedema risk and learn post op shoulder ROM HEP   Currently in Pain? Yes   Pain Score 5    Pain Location Back   Pain Orientation Lower   Pain Descriptors / Indicators Cramping   Pain Type  Chronic pain   Pain Radiating Towards left leg radiculopathy   Pain Onset More than a month ago   Pain Frequency Intermittent   Aggravating Factors  Sit to stand, prolonged sitting, trying to sleep   Pain Relieving Factors unknown   Multiple Pain Sites No            [facility] PT Assessment - 11/23/15 0001    Assessment   Medical Diagnosis Right breast cancer   Referring Provider Dr. Alphonsa Overall   Onset Date/Surgical Date 11/04/15   Hand Dominance Right   Prior Therapy none   Precautions   Precautions Other (comment)   Precaution Comments Active breast cancer   Restrictions   Weight Bearing Restrictions No   Balance Screen   Has the patient fallen in the past 6 months No   Has the patient had a decrease in activity level because of a fear of falling?  No   Is the patient reluctant to leave their home because of a fear of falling?  No   Home Environment   Living Environment Private residence   Living Arrangements Spouse/significant other   Available Help at Discharge Family   Prior Function   Level of Independence Independent   Vocation Full time employment  Vocation Requirements Works as an Data processing manager asst for [employer] She does not exercise   Cognition   Overall Cognitive Status Within Functional Limits for tasks assessed   Posture/Postural Control   Posture/Postural Control Postural limitations   Postural Limitations Rounded Shoulders;Forward head   ROM / Strength   AROM / PROM / Strength AROM;Strength   AROM   AROM Assessment Site Shoulder;Cervical   Right/Left Shoulder Right;Left   Right Shoulder Extension 48 Degrees   Right Shoulder Flexion 138 Degrees   Right Shoulder ABduction 150 Degrees   Right Shoulder Internal Rotation 83 Degrees   Right Shoulder External Rotation 70 Degrees   Left Shoulder Extension 38 Degrees   Left Shoulder Flexion 144 Degrees   Left Shoulder ABduction 155 Degrees   Left Shoulder Internal Rotation 70 Degrees   Left  Shoulder External Rotation 90 Degrees   Strength   Overall Strength Within functional limits for tasks performed           LYMPHEDEMA/ONCOLOGY QUESTIONNAIRE - 11/23/15 1511    Type   Cancer Type Right breast cancer   Lymphedema Assessments   Lymphedema Assessments Upper extremities   Right Upper Extremity Lymphedema   10 cm Proximal to Olecranon Process 23.8 cm   Olecranon Process 22 cm   10 cm Proximal to Ulnar Styloid Process 18 cm   Just Proximal to Ulnar Styloid Process 13.6 cm   Across Hand at [employer] 16.8 cm   At [place] of 2nd Digit 5.4 cm   Left Upper Extremity Lymphedema   10 cm Proximal to Olecranon Process 23.8 cm   Olecranon Process 22 cm   10 cm Proximal to Ulnar Styloid Process 17.6 cm   Just Proximal to Ulnar Styloid Process 13.4 cm   Across Hand at [employer] 16.7 cm   At [place] of 2nd Digit 5.3 cm      Patient was instructed today in a home exercise program today for post op shoulder range of motion. These included active assist shoulder flexion in sitting, scapular retraction, wall walking with shoulder abduction, and hands behind head external rotation.  She was encouraged to do these twice a day, holding 3 seconds and repeating 5 times when permitted by her physician.         PT Education - 11/23/15 1513    Education provided Yes   Education Details Lymphedema risk reduction and post op shoulder ROM HEP   Person(s) Educated Patient;Spouse   Methods Explanation;Demonstration;Handout   Comprehension Returned demonstration;Verbalized understanding              Breast Clinic Goals - 11/23/15 1516    Patient will be able to verbalize understanding of pertinent lymphedema risk reduction practices relevant to her diagnosis specifically related to skin care.   Time 1   Period Days   Status Achieved   Patient will be able to return demonstrate and/or verbalize understanding of the post-op home exercise program related to regaining shoulder  range of motion.   Time 1   Period Days   Status Achieved   Patient will be able to verbalize understanding of the importance of attending the postoperative After Breast Cancer Class for further lymphedema risk reduction education and therapeutic exercise.   Time 1   Period Days   Status Achieved              Plan - 11/23/15 1513    Clinical Impression Statement Patient was diagnosed on 11/04/15 with right  grade 1 invasive ductal carcinoma breast cancer in the upper outer quadrant.  It measures 1.2 cm, is ER/PR positive, HER2 negative and has a Ki67 of 5%. Her multidisciplinary medical team met prior to her being seen to determine a recommended treatment plan.  She is planning to have a right lumpectomy and sentinel node biopsy followed by Oncotype testing, radiation and anti-estrogen therapy.  She may benefit from post op PT to regain shoulder ROM and reduce lymphedema risk.  Due to her support system at home, her ability to continue working, and her lack of comorbidities, her evaluation is of low complexity.   Rehab Potential Excellent   Clinical Impairments Affecting Rehab Potential none   PT Frequency One time visit   PT Treatment/Interventions Therapeutic exercise;Patient/family education   PT Next Visit Plan Will f/u after surgery   PT Home Exercise Plan Post op shoulder ROM HEP   Consulted and Agree with Plan of Care Patient;Family member/caregiver   Family Member Consulted Husband      Patient will benefit from skilled therapeutic intervention in order to improve the following deficits and impairments:  Decreased strength, Pain, Decreased knowledge of precautions, Impaired UE functional use, Decreased range of motion  Visit Diagnosis: Carcinoma of upper-outer quadrant of right female breast ([place]) - Plan: PT plan of care cert/re-cert  Abnormal posture - Plan: PT plan of care cert/re-cert   Patient will follow up at outpatient cancer rehab if needed following surgery.  If  the patient requires physical therapy at that time, a specific plan will be dictated and sent to the referring physician for approval. The patient was educated today on appropriate basic range of motion exercises to begin post operatively and the importance of attending the After Breast Cancer class following surgery.  Patient was educated today on lymphedema risk reduction practices as it pertains to recommendations that will benefit the patient immediately following surgery.  She verbalized good understanding.  No additional physical therapy is indicated at this time.      Problem List Patient Active Problem List   Diagnosis Date Noted  . Breast cancer of upper-outer quadrant of right female breast ([place]) 11/17/2015  . Breast mass 11/15/2015  . Radicular pain of left lower extremity 10/28/2015  . Microcytic anemia 04/22/2015  . Health care maintenance 03/25/2013  . Arthralgia 07/27/2011  . Premature atrial contractions 06/24/2009  . DIZZINESS OR VERTIGO 05/30/2007  . HEMANGIOMA 05/02/2007  . Hyperlipidemia LDL goal <160 05/02/2007  . [place]  h/o 2003 05/02/2007  . GERD 05/02/2007  . IRRITABLE BOWEL SYNDROME 05/02/2007  . Osteoporosis 05/02/2007  . 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 FATIGUE, CHRONIC 05/02/2007  . COUGH 05/02/2007   Annia Friendly, PT 11/23/2015 3:21 PM  [place], [place], [address] Phone: [phone]   Fax:  [phone]  Name: SHALIMAR MCCLAIN MRN: [id number] Date of Birth: 30-Nov-1950

## 2015-11-23 NOTE — Progress Notes (Signed)
Subjective:     Patient ID: Alexis [name], female   DOB: 02-15-1951, 65 y.o.   MRN: [id number]  HPI   Review of Systems     Objective:   Physical Exam For the patient to understand and be given the tools to implement a healthy plant based diet during their cancer diagnosis.     Assessment:     Patient was seen today and found to be anxious and accompanied by her husband. Pts ht 65in, 118 pounds, 19.7 BMI. Pts medications: vitamin B complex, calcium, iron, pravastatin. Pt states she is a life time member of [employer] and goes there to weigh in, pt states she is within the ounce every time. Pt appeared slightly wasted around the clavical and face. Pt had food anxiety and began noticeably nervous (wringing of fingers) when adding more food into her day was discussed.  Pt seems to have some disordered eating patterns and food anxiety.     Plan:     Dietitian educated the patient on implementing a plant based diet by incorporating more plant proteins, fruits, and vegetables. As a part of a healthy routine physical activity was discussed. Dietitian treaded lightly with wording as to not cause the pt to restrict her intake further and not to upset her.   A folder of evidence based information with a focus on a plant based diet and general nutrition during cancer was given to the patient.  The importance of legitimate, evidence based information was discussed and examples were given. As a part of the continuum of care the cancer dietitian's contact information was given to the patient in the event they would like to have a follow up appointment.

## 2015-11-23 NOTE — Progress Notes (Signed)
Clinical Social Work [place] Psychosocial Distress Screening [place]  Patient completed distress screening protocol and scored a 0 on the Psychosocial Distress Thermometer which indicates no distress. Clinical Social Worker met with patient and patients husband in [facility] to assess for distress and other psychosocial needs. Patient stated she was feeling confident in her treatment plan and treatment team. CSW and patient discussed common feeling and emotions when being diagnosed with cancer, and the importance of support during treatment. CSW informed patient of the support team and support services at [facility]. CSW provided contact information and encouraged patient to call with any questions or concerns.    ONCBCN DISTRESS SCREENING 11/23/2015  Screening Type Initial Screening  Distress experienced in past week (1-10) 0   Alexis [name], MSW, LCSW, OSW-C Clinical Social Worker [place] [phone]

## 2015-11-23 NOTE — Progress Notes (Signed)
[place]  Telephone:(336) [phone] Fax:(336) [phone]     ID: NNEKA BLANDA DOB: Mar 12, 1951  MR#: [id number]  LOV#:[id number]  Patient Care Team: Tanda Rockers, MD as PCP - General (Pulmonary Disease) Alphonsa Overall, MD as Consulting Physician (General Surgery) Chauncey Cruel, MD as Consulting Physician (Oncology) Kyung Rudd, MD as Consulting Physician (Radiation Oncology) Leeroy Cha, MD as Consulting Physician (Neurosurgery) Allyn Kenner, MD (Dermatology) Wallene Huh, DPM as Consulting Physician (Podiatry) PCP: Christinia Gully, MD OTHER MD:  CHIEF COMPLAINT: estrogen receptor positive breast cancer  CURRENT TREATMENT:  Awaiting definitive surgery   BREAST CANCER HISTORY: Sole  Had routine mammographic screening with tomography at the [facility] 11/04/2015. This showed an area of distortion and calcifications in the right breast.  Right diagnostic mammography with tomography and right breast ultrason[facility]/15/2015 on the breast density to be categoryC.  In the upper-outer quadrant of the right breast there was an area of distortion associated with pleomorphic calcifications measuring approximately 1 cm. On exam there was no palpable mass.  By ultrasonography, there was an irregular hypoechoic mass superiorly measuring 1.2 cm. Ultrasound of the axilla was benign.  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  Her subsequent history is as detailed below  INTERVAL HISTORY: Ashlynne was evaluated in the multidisciplinary breast cancer clinic 11/23/2015 accompanied by her husband Clare Gandy. Her case was also presented in the multidisciplinary breast cancer  conference that same morning. At that time a preliminary plan was proposed:  Breast MRI, breast conserving surgery with sentinel lymph node sampling,Oncotype, adjuvant radiation and adjuvant anti-estrogens  REVIEW OF SYSTEMS: There were no specific symptoms leading to the original mammogram, which was routinely scheduled. The patient denies unusual headaches, visual changes, nausea, vomiting, stiff neck, dizziness, or gait imbalance. There has been no cough, phlegm production, or pleurisy, no chest pain or pressure, and no change in bowel or bladder habits. The patient denies fever, rash, bleeding, unexplained fatigue or unexplained weight loss. She admits to a history of scoliosis with pain in her lower back and down her left leg. This is being evaluated by neurology. A detailed review of systems was otherwise entirely negative.  PAST MEDICAL HISTORY: Past Medical History  Diagnosis Date  . GERD (gastroesophageal reflux disease)   . Vertigo   . MVP (mitral valve prolapse)   . Kyphosis of thoracic region   . Chronic fatigue   . Osteopenia   . Hemangioma   . Hyperlipidemia   . Iron deficiency anemia   . Cough   . IBS (irritable bowel syndrome)   . Breast cancer ([place])     PAST SURGICAL HISTORY: Past Surgical History  Procedure Laterality Date  . Sah  2003    s/p embolization per Dr. Estanislado Pandy  . Tubal ligation    . Tonsillectomy    . Appendectomy      FAMILY HISTORY Family History  Problem Relation Age of Onset  . Diabetes Brother   . Hyperlipidemia Mother   . Breast cancer Mother   . Heart attack Father   . Breast cancer Maternal Aunt   . Breast cancer Paternal Aunt   the patient's father died from a ruptured aneurysm at the age of 52.  The patient's mother was diagnosed with breast cancer at the age of 58 and died at age 53. The patient has a paternal aunt diagnosed with breast cancer at age 29. The patient has 2 brothers, 2 sisters. Ne brother has had a history of aneurysms,  as has the patient There is no history of ovarian cancer in the family.  GYNECOLOGIC HISTORY:  No LMP recorded. Patient is postmenopausal. Menarche age 65, first live birth age 65, the patient is [place] P2. She stopped having periods at age 26.He did not take hormone replacement. 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 Daughter [name] lives in [place] where she works as an Medical illustrator. (Adopted) son Japji Kok lives in [place] where he is a Museum/gallery curator. 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 The patient has 3 grandchildren aged 46, 51, and 75. She attends a [employer]    ADVANCED DIRECTIVES: not in place   HEALTH MAINTENANCE: Social History  Substance Use Topics  . Smoking status: Never Smoker   . Smokeless tobacco: Never Used  . Alcohol Use: No     Colonoscopy: 2012/[facility]  PAP:2012  Bone density: 2015  Lipid panel:  Allergies  Allergen Reactions  . Latex     Current Outpatient Prescriptions  Medication Sig Dispense Refill  . aspirin 81 MG tablet Take 81 mg by mouth daily.     Marland Kitchen b complex vitamins tablet Take 1 tablet by mouth daily.    . calcium citrate-vitamin D (CITRACAL+D) 315-200 MG-UNIT tablet Take 1 tablet by mouth daily.    . iron polysaccharides (NIFEREX) 150 MG capsule Take 1 capsule (150 mg total) by mouth daily. 30 capsule 2  . pantoprazole (PROTONIX) 40 MG tablet TAKE 1 BY MOUTH DAILY 90 tablet 3  . pravastatin (PRAVACHOL) 40 MG tablet TAKE 1 BY MOUTH EVERY EVENING 90 tablet 3   No current facility-administered medications for this visit.    OBJECTIVE: middle-aged white woman who appears well Filed Vitals:   11/23/15 0853  BP: 174/73  Pulse: 67  Temp: 98.1 F (36.7 C)  Resp: 20     Body mass index is 19.63 kg/(m^2).    ECOG  FS:1 - Symptomatic but completely ambulatory  Ocular: Sclerae unicteric, pupils equal, round and reactive to light Ear-nose-throat: Oropharynx clear and moist Lymphatic: No cervical or supraclavicular adenopathy Lungs no rales or rhonchi, good excursion bilaterally Heart regular rate and rhythm, no murmur appreciated Abd soft, nontender, positive bowel sounds MSK scoliosis butno focal spinal tenderness, no joint edema Neuro: non-focal, well-oriented, appropriate affect Breasts: the right breast is status post recent biopsy. There is a small ecchymosis. There are no suspicious masses and no skin or nipple changes of concern. The right axilla is benign. The left breast is unremarkable.   LAB RESULTS:  CMP     Component Value Date/Time   NA 143 11/23/2015 0832   NA 141 04/21/2015 0932   K 3.6 11/23/2015 0832   K 4.3 04/21/2015 0932   CL 103 04/21/2015 0932   CO2 28 11/23/2015 0832   CO2 31 04/21/2015 0932   GLUCOSE 73 11/23/2015 0832   GLUCOSE 98 04/21/2015 0932   GLUCOSE 106* 05/24/2006 0950   BUN 14.7 11/23/2015 0832   BUN 10 04/21/2015 0932   CREATININE 0.8 11/23/2015 7619  CREATININE 0.67 04/21/2015 0932   CALCIUM 9.3 11/23/2015 0832   CALCIUM 9.8 04/21/2015 0932   PROT 7.0 11/23/2015 0832   PROT 7.2 04/21/2015 0932   ALBUMIN 3.6 11/23/2015 0832   ALBUMIN 4.0 04/21/2015 0932   AST 20 11/23/2015 0832   AST 18 04/21/2015 0932   ALT 16 11/23/2015 0832   ALT 13 04/21/2015 0932   ALKPHOS 82 11/23/2015 0832   ALKPHOS 69 04/21/2015 0932   BILITOT 0.40 11/23/2015 0832   BILITOT 0.8 04/21/2015 0932   GFRNONAA 90.91 06/24/2009 0931   GFRAA 111 06/07/2008 0909    INo results found for: SPEP, UPEP  Lab Results  Component Value Date   WBC 4.4 11/23/2015   NEUTROABS 2.6 11/23/2015   HGB 11.6 11/23/2015   HCT 36.1 11/23/2015   MCV 82.6 11/23/2015   PLT 183 11/23/2015      Chemistry      Component Value Date/Time   NA 143 11/23/2015 0832   NA 141 04/21/2015 0932    K 3.6 11/23/2015 0832   K 4.3 04/21/2015 0932   CL 103 04/21/2015 0932   CO2 28 11/23/2015 0832   CO2 31 04/21/2015 0932   BUN 14.7 11/23/2015 0832   BUN 10 04/21/2015 0932   CREATININE 0.8 11/23/2015 0832   CREATININE 0.67 04/21/2015 0932      Component Value Date/Time   CALCIUM 9.3 11/23/2015 0832   CALCIUM 9.8 04/21/2015 0932   ALKPHOS 82 11/23/2015 0832   ALKPHOS 69 04/21/2015 0932   AST 20 11/23/2015 0832   AST 18 04/21/2015 0932   ALT 16 11/23/2015 0832   ALT 13 04/21/2015 0932   BILITOT 0.40 11/23/2015 0832   BILITOT 0.8 04/21/2015 0932       No results found for: LABCA2  No components found for: QPYPP509  No results for input(s): INR in the last 168 hours.  Urinalysis    Component Value Date/Time   COLORURINE YELLOW 04/21/2015 0932   APPEARANCEUR CLEAR 04/21/2015 0932   LABSPEC 1.010 04/21/2015 0932   PHURINE 6.0 04/21/2015 0932   GLUCOSEU NEGATIVE 04/21/2015 0932   HGBUR NEGATIVE 04/21/2015 0932   BILIRUBINUR NEGATIVE 04/21/2015 0932   KETONESUR NEGATIVE 04/21/2015 0932   UROBILINOGEN 0.2 04/21/2015 0932   NITRITE NEGATIVE 04/21/2015 0932   LEUKOCYTESUR NEGATIVE 04/21/2015 0932      ELIGIBLE FOR AVAILABLE RESEARCH PROTOCOL: no  STUDIES: Mm Digital Diagnostic Unilat R  11/16/2015  CLINICAL DATA:  Status post ultrasound-guided core needle biopsy of right breast 12 o'clock mass. EXAM: DIAGNOSTIC RIGHT MAMMOGRAM POST ULTRASOUND BIOPSY COMPARISON:  Previous exam(s). FINDINGS: Mammographic images were obtained following ultrasound guided biopsy of right breast 12 o'clock mass. Two-view mammography demonstrates presence of ribbon shaped tissue marker within the biopsy site, right breast 12 o'clock, anterior depth. Expected post biopsy changes are seen. IMPRESSION: Successful placement of tissue marker status post right breast ultrasound-guided core needle biopsy. Final Assessment: Post Procedure Mammograms for Marker Placement Electronically Signed   By:  Fidela Salisbury M.D.   On: 11/16/2015 16:39   US Breast Ltd Uni Right Inc Axilla  11/15/2015  CLINICAL DATA:  Callback from screening mammogram EXAM: 2D DIGITAL DIAGNOSTIC RIGHT MAMMOGRAM WITH CAD AND ADJUNCT TOMO ULTRASOUND RIGHT BREAST COMPARISON:  Previous exam(s). ACR Breast Density Category c: The breast tissue is heterogeneously dense, which may obscure small masses. FINDINGS: Magnification views and CC and MLO spot-compression views of the right breast were performed. On the additional views, there is an area of distortion within  the anterior, superior right breast. There are associated pleomorphic calcifications spanning approximately 1 cm. Mammographic images were processed with CAD. On physical exam, no discrete mass is felt in the area of concern within the superior right breast. Targeted ultrasound is performed, showing an irregular, hypoechoic mass at 12 o'clock, 1 cm from the nipple measuring 1.2 x 0.8 x 0.9 cm, corresponding to the area of concern within the superior right breast. Targeted ultrasound of the right axilla demonstrates no suspicious appearing axillary lymph nodes. IMPRESSION: Indeterminate right breast mass. RECOMMENDATION: Ultrasound-guided right breast biopsy. I have discussed the findings and recommendations with the patient. Results were also provided in writing at the conclusion of the visit. If applicable, a reminder letter will be sent to the patient regarding the next appointment. BI-RADS CATEGORY  4: Suspicious. Electronically Signed   By: Pamelia Hoit M.D.   On: 11/15/2015 08:16   Mm Diag Breast Tomo Uni Right  11/15/2015  CLINICAL DATA:  Callback from screening mammogram EXAM: 2D DIGITAL DIAGNOSTIC RIGHT MAMMOGRAM WITH CAD AND ADJUNCT TOMO ULTRASOUND RIGHT BREAST COMPARISON:  Previous exam(s). ACR Breast Density Category c: The breast tissue is heterogeneously dense, which may obscure small masses. FINDINGS: Magnification views and CC and MLO spot-compression views of  the right breast were performed. On the additional views, there is an area of distortion within the anterior, superior right breast. There are associated pleomorphic calcifications spanning approximately 1 cm. Mammographic images were processed with CAD. On physical exam, no discrete mass is felt in the area of concern within the superior right breast. Targeted ultrasound is performed, showing an irregular, hypoechoic mass at 12 o'clock, 1 cm from the nipple measuring 1.2 x 0.8 x 0.9 cm, corresponding to the area of concern within the superior right breast. Targeted ultrasound of the right axilla demonstrates no suspicious appearing axillary lymph nodes. IMPRESSION: Indeterminate right breast mass. RECOMMENDATION: Ultrasound-guided right breast biopsy. I have discussed the findings and recommendations with the patient. Results were also provided in writing at the conclusion of the visit. If applicable, a reminder letter will be sent to the patient regarding the next appointment. BI-RADS CATEGORY  4: Suspicious. Electronically Signed   By: Pamelia Hoit M.D.   On: 11/15/2015 08:16   Mm Screening Breast Tomo Bilateral  11/04/2015  CLINICAL DATA:  Screening. EXAM: 2D DIGITAL SCREENING BILATERAL MAMMOGRAM WITH CAD AND ADJUNCT TOMO COMPARISON:  Previous exam(s). ACR Breast Density Category d: The breast tissue is extremely dense, which lowers the sensitivity of mammography. FINDINGS: In the right breast, possible distortion with calcifications warrants further evaluation. In the left breast, no findings suspicious for malignancy. Images were processed with CAD. IMPRESSION: Further evaluation is suggested for possible distortion with calcifications in the right breast. RECOMMENDATION: Diagnostic mammogram and possibly ultrasound of the right breast. (Code:[id number]) The patient will be contacted regarding the findings, and additional imaging will be scheduled. BI-RADS CATEGORY  0: Incomplete. Need additional imaging  evaluation and/or prior mammograms for comparison. Electronically Signed   By: Ammie Ferrier M.D.   On: 11/04/2015 13:59   [place] Rt Breast Bx W Loc Dev 1st Lesion Img Bx Spec US Guide  11/17/2015  ADDENDUM REPORT: 11/17/2015 13:17 ADDENDUM: Pathology revealed GRADE I INVASIVE DUCTAL CARCINOMA WITH CALCIFICATIONS, DUCTAL CARCINOMA IN SITU WITH CALCIFICATIONS of the Right breast at the 12:00 o'clock location. This was found to be concordant by Dr. Fidela Salisbury. Pathology results were discussed with the patient by telephone. The patient reported doing well after the biopsy with tenderness  at the site. Post biopsy instructions and care were reviewed and questions were answered. The patient was encouraged to call [facility] for any additional concerns. The patient was referred to [facility] at [facility] on November 23, 2015. Pathology results reported by Rene Kocher, RN on 11/17/2015. Electronically Signed   By: Ted Mcalpine M.D.   On: 11/17/2015 13:17  11/17/2015  CLINICAL DATA:  Right breast 12 o'clock mass with associated microcalcifications. EXAM: ULTRASOUND GUIDED RIGHT BREAST CORE NEEDLE BIOPSY COMPARISON:  Previous exam(s). FINDINGS: I met with the patient and we discussed the procedure of ultrasound-guided biopsy, including benefits and alternatives. We discussed the high likelihood of a successful procedure. We discussed the risks of the procedure, including infection, bleeding, tissue injury, clip migration, and inadequate sampling. Informed written consent was given. The usual time-out protocol was performed immediately prior to the procedure. Using sterile technique and 1% Lidocaine as local anesthetic, under direct ultrasound visualization, a 14 gauge spring-loaded device was used to perform biopsy of right breast 12 o'clock mass using a lateral approach. At the conclusion of the procedure a ribbon shaped  tissue marker clip was deployed into the biopsy cavity. Follow up 2 view mammogram was performed and dictated separately. IMPRESSION: Ultrasound guided biopsy of right breast 12 o'clock mass. No apparent complications. Electronically Signed: By: Ted Mcalpine M.D. On: 11/16/2015 16:32    ASSESSMENT: 65 y.o.   Clyde Canterbury is 1) rest conserving surgery with sentinel lymph node sampling pending  (2) Oncotype DX to be sent from the definitive surgical samples  (3) adjuvant radiation to follow  (4) anti-estrogens to follow at the completion of local treatment  (a) [facility] density at [facility] 04/16/2014 found osteopenia with a T score of -1.6  (5) the patient does not meet criteria for genetics testing    PLAN: We spent the better part of today's hour-long appointment discussing the biology of breast cancer in general, and the specifics of the patient's tumor in particular. Makenley understand that cancer is not one disease but more than 100 different diseases and that it is important to keep them separate and not confused. Similarly she understands that if a person has breast cancer and say they develop bone metastases, they don't have bone cancer but breast cancer in the bone. It is important to maintain these distinctions otherwise the whole issue can become very complex  We then discussed her treatment options in terms local versus systemic therapy. In terms of local therapy she understands that lumpectomy plus radiation is equivalent to mastectomy in terms of survival. That is our recommendation.  In terms of systemic therapy, she is not a candidate for anti-HER-2 treatment. She is a very good candidate for anti-estrogens.  S[place] understands that she has an early-stage breast cancer that is not aggressive looking and that is slow-growing. This means the benefit from chemotherapy is likely to be marginal. For that reason we will request an Oncotype DX. Results take approximately 2 weeks  to come back. We are going to call her with those results but she understands my expectation is that her cancer will fall in the "low risk" group, and therefore she will not need or benefit from chemotherapy  Accordingly I will plan to see her once her local treatment is completed. That will be in approximately 8-10 weeks. At that point we will decide the best antiestrogen for her.  She is concerned that her long-standingback pain  might be related to this cancer. She has a significant history of scoliosis Webb Silversmith has been evaluated for this by Dr. Joya Salm. I think that's what is going to be the cause but we certainly can do some plain films of her back at some point if the back iscomfort seems to become more intense or persistent.  The patient has a good understanding of the overall plan. She agrees with it. She knows the goal of treatment in her case is cure. She will call with any problems that may develop before her next visit here.  Chauncey Cruel, MD   11/23/2015 4:58 PM Medical Oncology and Hematology [facility] [address] [place], [place] [address] Tel. [phone]    Fax. [phone]

## 2015-11-23 NOTE — Telephone Encounter (Signed)
lvm to inform patient of next appt per GM 6/7 pof

## 2015-11-23 NOTE — Patient Instructions (Signed)

## 2015-11-25 ENCOUNTER — Telehealth: Payer: Self-pay | Admitting: *Deleted

## 2015-11-25 NOTE — Telephone Encounter (Signed)
  Oncology Nurse Navigator Documentation    Navigator Encounter Type: Telephone (11/25/15 1400) Telephone: [place] Call (11/25/15 1400)  Called to discuss [place] from 11/23/15. Denies questions or concerns regarding dx or treatment care plan.            Barriers/Navigation Needs: No barriers at this time;No Questions;No Needs (11/25/15 1400)   Interventions: None required (11/25/15 1400)                      Time Spent with Patient: 15 (11/25/15 1400)

## 2015-11-26 ENCOUNTER — Other Ambulatory Visit: Payer: Self-pay | Admitting: Surgery

## 2015-11-26 DIAGNOSIS — C50911 Malignant neoplasm of unspecified site of right female breast: Secondary | ICD-10-CM

## 2015-11-30 ENCOUNTER — Encounter: Payer: Self-pay | Admitting: Radiation Oncology

## 2015-11-30 NOTE — Progress Notes (Signed)
Radiation Oncology         [phone]) [phone] ________________________________  Name: Alexis [name] MRN: [id number]  Date: 11/23/2015  DOB: [birth year]  [id number] [name], MD  Alphonsa Overall, MD     REFERRING PHYSICIAN: Alphonsa Overall, MD   DIAGNOSIS: The encounter diagnosis was Breast cancer of upper-outer quadrant of right female breast [facility]).  Breast cancer of upper-outer quadrant of right female breast [facility])   Staging form: Breast, AJCC 7th Edition     Clinical stage from 11/23/2015: Stage IA (T1c, N0, M0) - Unsigned       Staging comments: Staged at breast conference on 6.7.17     HISTORY OF PRESENT ILLNESS::Alexis [name] is a 65 y.o. female who is seen for an initial consultation visit regarding the patient's diagnosis of breast cancer.  The patient was found to have suspicious findings within the right breast on initial mammogram. The patient has not had symptoms prior to this study. A diagnostic mammogram and breast ultrasound confirmed this finding. On ultrasound, the tumor measured 1.2 cm and no suspicious findings were seen within the axilla on the right.  A biopsy was performed. 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 Receptors studies were completed and indicate that the tumor is estrogen receptor positive, progesterone receptor negative, and Her-2/neu negative. The Ki-67 staining was 5 %.   The patient has not undergone an MRI scan of the breasts: This was discussed as a possibility and surgery will make the final determination whether this would potentially impact surgical management.    PREVIOUS RADIATION THERAPY: No   PAST MEDICAL HISTORY:  has a past medical history of GERD (gastroesophageal reflux disease); Vertigo; MVP (mitral valve prolapse); Kyphosis of thoracic region; Chronic fatigue; Osteopenia; Hemangioma; Hyperlipidemia; Iron deficiency anemia; Cough; IBS (irritable bowel syndrome); and Breast cancer ([place]).     PAST SURGICAL  HISTORY: Past Surgical History  Procedure Laterality Date  . Sah  2003    s/p embolization per Dr. Estanislado Pandy  . Tubal ligation    . Tonsillectomy    . Appendectomy       FAMILY HISTORY: family history includes Breast cancer in her maternal aunt, mother, and paternal aunt; Diabetes in her brother; Heart attack in her father; Hyperlipidemia in her mother.   SOCIAL HISTORY:  reports that she has never smoked. She has never used smokeless tobacco. She reports that she does not drink alcohol or use illicit drugs.   ALLERGIES: Latex   MEDICATIONS:  Current Outpatient Prescriptions  Medication Sig Dispense Refill  . aspirin 81 MG tablet Take 81 mg by mouth daily.     Marland Kitchen b complex vitamins tablet Take 1 tablet by mouth daily.    . calcium citrate-vitamin D (CITRACAL+D) 315-200 MG-UNIT tablet Take 1 tablet by mouth daily.    . iron polysaccharides (NIFEREX) 150 MG capsule Take 1 capsule (150 mg total) by mouth daily. 30 capsule 2  . pantoprazole (PROTONIX) 40 MG tablet TAKE 1 BY MOUTH DAILY 90 tablet 3  . pravastatin (PRAVACHOL) 40 MG tablet TAKE 1 BY MOUTH EVERY EVENING 90 tablet 3   No current facility-administered medications for this encounter.     REVIEW OF SYSTEMS:  A 15 point review of systems is documented in the electronic medical record. This was obtained by the nursing staff. However, I reviewed this with the patient to discuss relevant findings and make appropriate changes.  Pertinent items are noted in HPI.    PHYSICAL EXAM:  vitals were  not taken for this visit.  ECOG = 1  0 - Asymptomatic (Fully active, able to carry on all predisease activities without restriction)  1 - Symptomatic but completely ambulatory (Restricted in physically strenuous activity but ambulatory and able to carry out work of a light or sedentary nature. For example, light housework, office work)  2 - Symptomatic, <50% in bed during the day (Ambulatory and capable of all self care but unable to  carry out any work activities. Up and about more than 50% of waking hours)  3 - Symptomatic, >50% in bed, but not bedbound (Capable of only limited self-care, confined to bed or chair 50% or more of waking hours)  4 - Bedbound (Completely disabled. Cannot carry on any self-care. Totally confined to bed or chair)  5 - Death   Eustace Pen MM, Creech RH, Tormey DC, et al. [phone]). "Toxicity and response criteria of the [facility]". [place]. 5 (6): 649-55  General: Well-developed, in no acute distress HEENT: Normocephalic, atraumatic; oral cavity clear Neck: Supple without any lymphadenopathy Cardiovascular: Regular rate and rhythm Respiratory: Clear to auscultation bilaterally Breasts: Small area of ecchymosis present within the right breast status post biopsy. No skin changes. No suspicious masses. Right axilla negative. No suspicious findings on the left within the breast and axilla GI: Soft, nontender, normal bowel sounds Extremities: No edema present Neuro: No focal deficits     LABORATORY DATA:  Lab Results  Component Value Date   WBC 4.4 11/23/2015   HGB 11.6 11/23/2015   HCT 36.1 11/23/2015   MCV 82.6 11/23/2015   PLT 183 11/23/2015   Lab Results  Component Value Date   NA 143 11/23/2015   K 3.6 11/23/2015   CL 103 04/21/2015   CO2 28 11/23/2015   Lab Results  Component Value Date   ALT 16 11/23/2015   AST 20 11/23/2015   ALKPHOS 82 11/23/2015   BILITOT 0.40 11/23/2015      RADIOGRAPHY: Mr Breast Bilateral W Wo Contrast  11/24/2015  CLINICAL DATA:  65 year old female with newly diagnosed right breast cancer. LABS:  None performed today. EXAM: BILATERAL BREAST MRI WITH AND WITHOUT CONTRAST TECHNIQUE: Multiplanar, multisequence MR images of both breasts were obtained prior to and following the intravenous administration of 10 ml of MultiHance. THREE-DIMENSIONAL MR IMAGE RENDERING ON INDEPENDENT WORKSTATION: Three-dimensional MR images were  rendered by post-processing of the original MR data on an independent workstation. The three-dimensional MR images were interpreted, and findings are reported in the following complete MRI report for this study. Three dimensional images were evaluated at the independent DynaCad workstation COMPARISON:  11/16/2015 and prior mammograms and ultrasounds. FINDINGS: Breast composition: d. Extreme fibroglandular tissue. Background parenchymal enhancement: Mild Right breast: A 1 cm irregular upper right breast mass with plateau kinetics (anterior third) is compatible with biopsy-proven neoplasm. Biopsy clip artifact along the inferior aspect of this mass is noted. No other suspicious areas of enhancement are noted. Left breast: No mass or abnormal enhancement. Lymph nodes: No abnormal appearing lymph nodes. Ancillary findings:  None. IMPRESSION: 1 cm biopsy-proven malignancy within the upper right breast. No evidence of multifocal, multicentric or contralateral disease. No abnormal lymph nodes. RECOMMENDATION: Treatment plan BI-RADS CATEGORY  6: Known biopsy-proven malignancy. Electronically Signed   By: Margarette Canada M.D.   On: 11/24/2015 16:17   Mm Digital Diagnostic Unilat R  11/16/2015  CLINICAL DATA:  Status post ultrasound-guided core needle biopsy of right breast 12 o'clock mass. EXAM: DIAGNOSTIC RIGHT  MAMMOGRAM POST ULTRASOUND BIOPSY COMPARISON:  Previous exam(s). FINDINGS: Mammographic images were obtained following ultrasound guided biopsy of right breast 12 o'clock mass. Two-view mammography demonstrates presence of ribbon shaped tissue marker within the biopsy site, right breast 12 o'clock, anterior depth. Expected post biopsy changes are seen. IMPRESSION: Successful placement of tissue marker status post right breast ultrasound-guided core needle biopsy. Final Assessment: Post Procedure Mammograms for Marker Placement Electronically Signed   By: Fidela Salisbury M.D.   On: 11/16/2015 16:39   US Breast Ltd  Uni Right Inc Axilla  11/15/2015  CLINICAL DATA:  Callback from screening mammogram EXAM: 2D DIGITAL DIAGNOSTIC RIGHT MAMMOGRAM WITH CAD AND ADJUNCT TOMO ULTRASOUND RIGHT BREAST COMPARISON:  Previous exam(s). ACR Breast Density Category c: The breast tissue is heterogeneously dense, which may obscure small masses. FINDINGS: Magnification views and CC and MLO spot-compression views of the right breast were performed. On the additional views, there is an area of distortion within the anterior, superior right breast. There are associated pleomorphic calcifications spanning approximately 1 cm. Mammographic images were processed with CAD. On physical exam, no discrete mass is felt in the area of concern within the superior right breast. Targeted ultrasound is performed, showing an irregular, hypoechoic mass at 12 o'clock, 1 cm from the nipple measuring 1.2 x 0.8 x 0.9 cm, corresponding to the area of concern within the superior right breast. Targeted ultrasound of the right axilla demonstrates no suspicious appearing axillary lymph nodes. IMPRESSION: Indeterminate right breast mass. RECOMMENDATION: Ultrasound-guided right breast biopsy. I have discussed the findings and recommendations with the patient. Results were also provided in writing at the conclusion of the visit. If applicable, a reminder letter will be sent to the patient regarding the next appointment. BI-RADS CATEGORY  4: Suspicious. Electronically Signed   By: Pamelia Hoit M.D.   On: 11/15/2015 08:16   Mm Diag Breast Tomo Uni Right  11/15/2015  CLINICAL DATA:  Callback from screening mammogram EXAM: 2D DIGITAL DIAGNOSTIC RIGHT MAMMOGRAM WITH CAD AND ADJUNCT TOMO ULTRASOUND RIGHT BREAST COMPARISON:  Previous exam(s). ACR Breast Density Category c: The breast tissue is heterogeneously dense, which may obscure small masses. FINDINGS: Magnification views and CC and MLO spot-compression views of the right breast were performed. On the additional views, there is  an area of distortion within the anterior, superior right breast. There are associated pleomorphic calcifications spanning approximately 1 cm. Mammographic images were processed with CAD. On physical exam, no discrete mass is felt in the area of concern within the superior right breast. Targeted ultrasound is performed, showing an irregular, hypoechoic mass at 12 o'clock, 1 cm from the nipple measuring 1.2 x 0.8 x 0.9 cm, corresponding to the area of concern within the superior right breast. Targeted ultrasound of the right axilla demonstrates no suspicious appearing axillary lymph nodes. IMPRESSION: Indeterminate right breast mass. RECOMMENDATION: Ultrasound-guided right breast biopsy. I have discussed the findings and recommendations with the patient. Results were also provided in writing at the conclusion of the visit. If applicable, a reminder letter will be sent to the patient regarding the next appointment. BI-RADS CATEGORY  4: Suspicious. Electronically Signed   By: Pamelia Hoit M.D.   On: 11/15/2015 08:16   Mm Screening Breast Tomo Bilateral  11/04/2015  CLINICAL DATA:  Screening. EXAM: 2D DIGITAL SCREENING BILATERAL MAMMOGRAM WITH CAD AND ADJUNCT TOMO COMPARISON:  Previous exam(s). ACR Breast Density Category d: The breast tissue is extremely dense, which lowers the sensitivity of mammography. FINDINGS: In the right breast, possible distortion  with calcifications warrants further evaluation. In the left breast, no findings suspicious for malignancy. Images were processed with CAD. IMPRESSION: Further evaluation is suggested for possible distortion with calcifications in the right breast. RECOMMENDATION: Diagnostic mammogram and possibly ultrasound of the right breast. (Code:[id number]) The patient will be contacted regarding the findings, and additional imaging will be scheduled. BI-RADS CATEGORY  0: Incomplete. Need additional imaging evaluation and/or prior mammograms for comparison. Electronically Signed    By: Ammie Ferrier M.D.   On: 11/04/2015 13:59   [place] Rt Breast Bx W Loc Dev 1st Lesion Img Bx Spec US Guide  11/17/2015  ADDENDUM REPORT: 11/17/2015 13:17 ADDENDUM: Pathology revealed GRADE I INVASIVE DUCTAL CARCINOMA WITH CALCIFICATIONS, DUCTAL CARCINOMA IN SITU WITH CALCIFICATIONS of the Right breast at the 12:00 o'clock location. This was found to be concordant by Dr. Fidela Salisbury. Pathology results were discussed with the patient by telephone. The patient reported doing well after the biopsy with tenderness at the site. Post biopsy instructions and care were reviewed and questions were answered. The patient was encouraged to call [facility] for any additional concerns. The patient was referred to [facility] on November 23, 2015. Pathology results reported by Terie Purser, RN on 11/17/2015. Electronically Signed   By: Fidela Salisbury M.D.   On: 11/17/2015 13:17  11/17/2015  CLINICAL DATA:  Right breast 12 o'clock mass with associated microcalcifications. EXAM: ULTRASOUND GUIDED RIGHT BREAST CORE NEEDLE BIOPSY COMPARISON:  Previous exam(s). FINDINGS: I met with the patient and we discussed the procedure of ultrasound-guided biopsy, including benefits and alternatives. We discussed the high likelihood of a successful procedure. We discussed the risks of the procedure, including infection, bleeding, tissue injury, clip migration, and inadequate sampling. Informed written consent was given. The usual time-out protocol was performed immediately prior to the procedure. Using sterile technique and 1% Lidocaine as local anesthetic, under direct ultrasound visualization, a 14 gauge spring-loaded device was used to perform biopsy of right breast 12 o'clock mass using a lateral approach. At the conclusion of the procedure a ribbon shaped tissue marker clip was deployed into the biopsy cavity. Follow up 2 view  mammogram was performed and dictated separately. IMPRESSION: Ultrasound guided biopsy of right breast 12 o'clock mass. No apparent complications. Electronically Signed: By: Fidela Salisbury M.D. On: 11/16/2015 16:32       IMPRESSION:    Breast cancer of upper-outer quadrant of right female breast ([place])   11/16/2015 Breast US Targeted ultrasound is performed, showing an irregular, hypoechoic mass at 12 o'clock, 1 cm from the nipple measuring 1.2 x 0.8 x 0.9 cm, corresponding to the area of concern within the superior right breast.    11/16/2015 Receptors her2 Estrogen Receptor: 100%, POSITIVE, STRONG STAINING INTENSITY Progesterone Receptor: 0%, NEGATIVE Proliferation Marker Ki67: 5%, HER2 - NEGATIVE   11/16/2015 Initial Biopsy Breast, right, needle core biopsy, 12:00 o'clock - INVASIVE DUCTAL CARCINOMA WITH CALCIFICATIONS. - DUCTAL CARCINOMA IN SITU WITH CALCIFICATIONS.   11/17/2015 Initial Diagnosis Breast cancer of upper-outer quadrant of right female breast [facility])    The patient has a recent diagnosis of Invasive ductal carcinoma of the right breast. She appears to be a good candidate for breast conservation treatment.  I discussed with the patient the role of adjuvant radiation treatment in this setting. We discussed the potential benefit of radiation treatment, especially with regards to local control of the patient's tumor. We also discussed the possible side  effects and risks of such a treatment as well.  All of the patient's questions were answered. The patient wishes to proceed with radiation treatment at the appropriate time.  PLAN: I look forward to seeing the patient postoperatively to review her case and further discuss and coordinate an anticipated course of radiation treatment.  Given her age and presentation, the patient appears to be a good candidate for a hypo-fractionated four-week course of radiation treatment.       ________________________________   Jodelle Gross, MD,  PhD   **Disclaimer: This note was dictated with voice recognition software. Similar sounding words can inadvertently be transcribed and this note may contain transcription errors which may not have been corrected upon publication of note.**

## 2015-12-01 ENCOUNTER — Other Ambulatory Visit: Payer: Self-pay | Admitting: Surgery

## 2015-12-01 DIAGNOSIS — C50911 Malignant neoplasm of unspecified site of right female breast: Secondary | ICD-10-CM

## 2015-12-14 ENCOUNTER — Encounter (HOSPITAL_BASED_OUTPATIENT_CLINIC_OR_DEPARTMENT_OTHER): Payer: Self-pay | Admitting: *Deleted

## 2015-12-21 ENCOUNTER — Ambulatory Visit
Admission: RE | Admit: 2015-12-21 | Discharge: 2015-12-21 | Disposition: A | Discharge status: Home / Self Care | Payer: BLUE CROSS/BLUE SHIELD | Source: Ambulatory Visit | Attending: Surgery | Admitting: Surgery

## 2015-12-21 DIAGNOSIS — C50911 Malignant neoplasm of unspecified site of right female breast: Secondary | ICD-10-CM

## 2015-12-21 NOTE — H&P (Signed)
Alexis [name] Location: [facility] Patient #: [id number] DOB: January 14, 1951 Undefined / Language: Alexis [name] / Race: White Female  History of Present Illness   Alexis patient is a 65 year old female who presents with a complaint of breast cancer.   Her PCP is Alexis [name]  She is at [facility] - [name].  She is accompanied by her husband, Alexis [name].  Her last mammogram was 1 year ago. She has felt nothing in her breast. She is taking a casual approach to Alexis diagnosis of breast cancer, since she had an cerebral aneurysm in 2003 - she already feels fortunate. She had a left breast biopsy in 2007 by Dr. Annamaria [name]. Her mother had breast cancer and her father's sister had breast cancer. She has not been on hormones. Alexis [name] underwent mammograms at [name] on 11/04/2015 which showed right breast distortion. She is noted to have dense breast on mammograms, breast density category "c". She underwent a 12 o'[name] right breast biopsy on 11/16/2015 ([id number]) which showed IDC, grade 1, ER - positive, PR - neg , Her2Neu - Neg, Ki67 - 5%.  I discussed Alexis options for breast cancer treatment with Alexis patient. She is in [facility] breast multidisciplinary clinic, which includes medical oncology and radiation oncology. I discussed Alexis surgical options of lumpectomy vs. mastectomy. If mastectomy, there is Alexis possibility of reconstruction. I discussed Alexis options of lymph node biopsy. Alexis treatment plan depends on Alexis pathologic staging of Alexis tumor and Alexis patient's personal wishes. Alexis risks of surgery include, but are not limited to, bleeding, infection, Alexis need for further surgery, and nerve injury. Alexis patient has been given literature on Alexis treatment of breast cancer.  Plan: 1) MRI (I will call patient after Alexis MRI), [name]) right lumpectomy and rigth axillary SLNBx, 3) Oncotype, 4) Rad tx, 5) anti hormones  Past Medical  History: 1. Hyperlipidemia [name]. History of cerebral aneurysm with chronic facial numbness - 2003 3. 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 4. MVP - on no meds 5. Colonoscopy by Dr. Fuller Plan - 2011 6. Some low back and left leg issues - pain She has scoliosis. She saw Dr. Joya [name] about 3 weeks ago, no evidence of disk disease.  Social History: She is accompanied by her husband, Alexis [name]. She works at Alexis [name]. She has 3 children - 1 is adopted. Ages Alexis [name] (68), Alexis [name] (30), and Alexis [name] [phone]). They all live in [name].   Other Problems Alexis Slipper, RN; 11/23/2015 7:47 AM) Asthma Back Pain Breast Cancer Cerebrovascular Accident Gastroesophageal Reflux Disease Heart murmur Hypercholesterolemia  Past Surgical History Alexis Slipper, RN; 11/23/2015 7:47 AM) Appendectomy Breast Biopsy Bilateral. Tonsillectomy  Diagnostic Studies History Alexis Slipper, RN; 11/23/2015 7:47 AM) Colonoscopy 1-5 years ago Mammogram within last year Pap Smear >5 years ago  Medication History Alexis Slipper, RN; 11/23/2015 7:48 AM) No Current Medications Medications Reconciled  Social History Alexis Slipper, RN; 11/23/2015 7:47 AM) Caffeine use Carbonated beverages, Coffee, Tea. No alcohol use No drug use Tobacco use Never smoker.  Family History Alexis Slipper, RN; 11/23/2015 7:47 AM) Alcohol Abuse Brother. Breast Cancer Family Members In General, Mother. Cerebrovascular Accident Brother, Father. Diabetes Mellitus Brother, Mother. Heart Disease Brother, Mother. Hypertension Mother.  Pregnancy / Birth History Alexis Slipper, RN; 11/23/2015 7:47 AM) Age at menarche 34 years. Age of menopause 51-55 Contraceptive History Oral contraceptives. Alexis Age  [name] Maternal age 65-25 Para [name]    Review of Systems Alexis Slipper RN; 11/23/2015 7:47 AM) General Not Present- Appetite Loss,  Chills, Fatigue, Fever, Night Sweats, Weight Gain and Weight Loss. Skin Not Present- Change in Wart/Mole, Dryness, Hives, Jaundice, New Lesions, Non-Healing Wounds, Rash and Ulcer. HEENT Present- Wears glasses/contact lenses. Not Present- Earache, Hearing Loss, Hoarseness, Nose Bleed, Oral Ulcers, Ringing in Alexis Ears, Seasonal Allergies, Sinus Pain, Sore Throat, Visual Disturbances and Yellow Eyes. Respiratory Not Present- Bloody sputum, Chronic Cough, Difficulty Breathing, Snoring and Wheezing. Breast Present- Breast Mass. Not Present- Breast Pain, Nipple Discharge and Skin Changes. Cardiovascular Not Present- Chest Pain, Difficulty Breathing Lying Down, Leg Cramps, Palpitations, Rapid Heart Rate, Shortness of Breath and Swelling of Extremities. Gastrointestinal Not Present- Abdominal Pain, Bloating, Bloody Stool, Change in Bowel Habits, Chronic diarrhea, Constipation, Difficulty Swallowing, Excessive gas, Gets full quickly at meals, Hemorrhoids, Indigestion, Nausea, Rectal Pain and Vomiting. Female Genitourinary Not Present- Frequency, Nocturia, Painful Urination, Pelvic Pain and Urgency. Musculoskeletal Present- Back Pain. Not Present- Joint Pain, Joint Stiffness, Muscle Pain, Muscle Weakness and Swelling of Extremities. Neurological Not Present- Decreased Memory, Fainting, Headaches, Numbness, Seizures, Tingling, Tremor, Trouble walking and Weakness. Psychiatric Not Present- Anxiety, Bipolar, Change in Sleep Pattern, Depression, Fearful and Frequent crying. Endocrine Not Present- Cold Intolerance, Excessive Hunger, Hair Changes, Heat Intolerance, Hot flashes and New Diabetes. Hematology Not Present- Easy Bruising, Excessive bleeding, Gland problems, HIV and Persistent Infections.   Physical Exam  General: WN WFalert and generally healthy appearing. HEENT: Normal. Pupils equal.  Neck: Supple. No mass. No thyroid mass.  Lymph Nodes: No supraclavicular, cervical or axillary nodes.  Lungs:  Clear to auscultation and symmetric breath sounds. Heart: RRR. No murmur or rub.  Breasts: Right - scar in Alexis UIQ from core biopsy, there is a fullness in Alexis area just above Alexis nipple which is about [name] cm Left - scar in Alexis UIQ from her prior biopsy. No masses felt.  Abdomen: Soft. No mass. No tenderness. No hernia. Normal bowel sounds. No abdominal scars.  Extremities: Good strength and ROM in upper and lower extremities.  Neurologic: Grossly intact to motor and sensory function. Psychiatric: Has normal mood and affect. Behavior is normal.  Assessment & Plan  1.  BREAST CANCER, STAGE 1, RIGHT (C50.911)  Story: 12 o'[name] right breast biopsy on 11/16/2015 ([id number]) which showed IDC, grade 1, ER - 100, PR - 0 , Her2Neu - Neg, Ki67 - 5%.  Oncology - [place] [facility] [place]   Plan:  1)  - Alexis [name] MRI on 11/24/2015 showed no new area of concern.  There is a 1 cm irregular upper right breast mass   [name]) right lumpectomy and rigth axillary SLNBx,  3) Oncotype,   4) Rad tx,  5) anti hormones  Addendum Note(Carlis Burnsworth H. Lucia Gaskins MD; 11/26/2015 7:26 PM) Alexis [name] MRI on 11/24/2015 showed no new area of concern. Will proceed with right breast lumpectomy and right axillary SLNBx. I talked to Alexis patient on Alexis phone. She was at [facility]. She would prefer a Thursday or a Friday for Alexis surgery.  [name]. Hyperlipidemia 3. History of cerebral aneurysm with chronic facial numbness - 2003 4. 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 5. MVP - on no meds 6. Some low back and left leg issues - pain She has scoliosis. She saw Dr. Joya [name] about 3 weeks ago, no evidence of disk disease.  Shanon Brow  Lucia Gaskins, MD, [facility] Pager: [phone] Office phone:  [phone]

## 2015-12-21 NOTE — Progress Notes (Signed)
Boost given with instructions to complete by 0700. Pt verbalized understanding.

## 2015-12-22 ENCOUNTER — Ambulatory Visit (HOSPITAL_BASED_OUTPATIENT_CLINIC_OR_DEPARTMENT_OTHER)
Admission: RE | Admit: 2015-12-22 | Discharge: 2015-12-22 | Disposition: A | Discharge status: Home / Self Care | Payer: BLUE CROSS/BLUE SHIELD | Source: Ambulatory Visit | Attending: Surgery | Admitting: Surgery

## 2015-12-22 ENCOUNTER — Ambulatory Visit
Admission: RE | Admit: 2015-12-22 | Discharge: 2015-12-22 | Disposition: A | Discharge status: Home / Self Care | Payer: BLUE CROSS/BLUE SHIELD | Source: Ambulatory Visit | Attending: Surgery | Admitting: Surgery

## 2015-12-22 ENCOUNTER — Ambulatory Visit (HOSPITAL_BASED_OUTPATIENT_CLINIC_OR_DEPARTMENT_OTHER): Payer: BLUE CROSS/BLUE SHIELD | Admitting: Anesthesiology

## 2015-12-22 ENCOUNTER — Encounter (HOSPITAL_BASED_OUTPATIENT_CLINIC_OR_DEPARTMENT_OTHER): Payer: Self-pay | Admitting: Anesthesiology

## 2015-12-22 ENCOUNTER — Ambulatory Visit (HOSPITAL_COMMUNITY)
Admission: RE | Admit: 2015-12-22 | Discharge: 2015-12-22 | Disposition: A | Discharge status: Home / Self Care | Payer: BLUE CROSS/BLUE SHIELD | Source: Ambulatory Visit | Attending: Surgery | Admitting: Surgery

## 2015-12-22 ENCOUNTER — Encounter (HOSPITAL_BASED_OUTPATIENT_CLINIC_OR_DEPARTMENT_OTHER)
Admission: RE | Disposition: A | Discharge status: Home / Self Care | Payer: Self-pay | Source: Ambulatory Visit | Attending: Surgery

## 2015-12-22 DIAGNOSIS — Z8673 Personal history of transient ischemic attack (TIA), and cerebral infarction without residual deficits: Secondary | ICD-10-CM | POA: Diagnosis not present

## 2015-12-22 DIAGNOSIS — C50911 Malignant neoplasm of unspecified site of right female breast: Secondary | ICD-10-CM | POA: Diagnosis present

## 2015-12-22 DIAGNOSIS — Z170 Estrogen receptor positive status [ER+]: Secondary | ICD-10-CM | POA: Insufficient documentation

## 2015-12-22 DIAGNOSIS — C50811 Malignant neoplasm of overlapping sites of right female breast: Secondary | ICD-10-CM | POA: Diagnosis not present

## 2015-12-22 DIAGNOSIS — Z79899 Other long term (current) drug therapy: Secondary | ICD-10-CM | POA: Insufficient documentation

## 2015-12-22 DIAGNOSIS — Z803 Family history of malignant neoplasm of breast: Secondary | ICD-10-CM | POA: Insufficient documentation

## 2015-12-22 DIAGNOSIS — Z791 Long term (current) use of non-steroidal anti-inflammatories (NSAID): Secondary | ICD-10-CM | POA: Diagnosis not present

## 2015-12-22 DIAGNOSIS — D0511 Intraductal carcinoma in situ of right breast: Secondary | ICD-10-CM | POA: Insufficient documentation

## 2015-12-22 DIAGNOSIS — K219 Gastro-esophageal reflux disease without esophagitis: Secondary | ICD-10-CM | POA: Insufficient documentation

## 2015-12-22 DIAGNOSIS — E7800 Pure hypercholesterolemia, unspecified: Secondary | ICD-10-CM | POA: Insufficient documentation

## 2015-12-22 DIAGNOSIS — Z7982 Long term (current) use of aspirin: Secondary | ICD-10-CM | POA: Insufficient documentation

## 2015-12-22 HISTORY — PX: BREAST LUMPECTOMY WITH RADIOACTIVE SEED AND SENTINEL LYMPH NODE BIOPSY: SHX6550

## 2015-12-22 HISTORY — PX: BREAST LUMPECTOMY: SHX2

## 2015-12-22 SURGERY — BREAST LUMPECTOMY WITH RADIOACTIVE SEED AND SENTINEL LYMPH NODE BIOPSY
Anesthesia: Regional | Site: Breast | Laterality: Right

## 2015-12-22 MED ORDER — DEXAMETHASONE SODIUM PHOSPHATE 4 MG/ML IJ SOLN
INTRAMUSCULAR | Status: DC | PRN
  Administered 2015-12-22: 10 mg via INTRAVENOUS

## 2015-12-22 MED ORDER — PROPOFOL 10 MG/ML IV BOLUS
INTRAVENOUS | Status: DC | PRN
  Administered 2015-12-22: 150 mg via INTRAVENOUS

## 2015-12-22 MED ORDER — PROPOFOL 10 MG/ML IV BOLUS
INTRAVENOUS | Status: AC
Start: 2015-12-22 — End: 2015-12-22
  Filled 2015-12-22: qty 20

## 2015-12-22 MED ORDER — BUPIVACAINE-EPINEPHRINE (PF) 0.25% -1:200000 IJ SOLN
INTRAMUSCULAR | Status: DC | PRN
  Administered 2015-12-22: 10 mL

## 2015-12-22 MED ORDER — LIDOCAINE 2% (20 MG/ML) 5 ML SYRINGE
INTRAMUSCULAR | Status: DC | PRN
  Administered 2015-12-22: 80 mg via INTRAVENOUS

## 2015-12-22 MED ORDER — BUPIVACAINE HCL (PF) 0.25 % IJ SOLN
INTRAMUSCULAR | Status: AC
  Filled 2015-12-22: qty 30

## 2015-12-22 MED ORDER — CEFAZOLIN SODIUM-DEXTROSE 2-4 GM/100ML-% IV SOLN
2.0000 g | INTRAVENOUS | Status: AC
  Administered 2015-12-22: 2 g via INTRAVENOUS

## 2015-12-22 MED ORDER — MIDAZOLAM HCL 2 MG/2ML IJ SOLN
INTRAMUSCULAR | Status: AC
  Filled 2015-12-22: qty 2

## 2015-12-22 MED ORDER — FENTANYL CITRATE (PF) 100 MCG/2ML IJ SOLN
50.0000 ug | INTRAMUSCULAR | Status: DC | PRN
  Administered 2015-12-22: 50 ug via INTRAVENOUS

## 2015-12-22 MED ORDER — CEFAZOLIN SODIUM-DEXTROSE 2-4 GM/100ML-% IV SOLN
INTRAVENOUS | Status: AC
  Filled 2015-12-22: qty 100

## 2015-12-22 MED ORDER — CHLORHEXIDINE GLUCONATE 4 % EX LIQD: 60.0000 mL | Freq: Once | CUTANEOUS | Status: DC

## 2015-12-22 MED ORDER — BUPIVACAINE-EPINEPHRINE (PF) 0.5% -1:200000 IJ SOLN
INTRAMUSCULAR | Status: DC | PRN
  Administered 2015-12-22: 30 mL via PERINEURAL

## 2015-12-22 MED ORDER — MIDAZOLAM HCL 2 MG/2ML IJ SOLN
1.0000 mg | INTRAMUSCULAR | Status: DC | PRN
  Administered 2015-12-22: 1 mg via INTRAVENOUS

## 2015-12-22 MED ORDER — EPHEDRINE 5 MG/ML INJ
INTRAVENOUS | Status: AC
  Filled 2015-12-22: qty 10

## 2015-12-22 MED ORDER — METHYLENE BLUE 0.5 % INJ SOLN
INTRAVENOUS | Status: AC
  Filled 2015-12-22: qty 10

## 2015-12-22 MED ORDER — GLYCOPYRROLATE 0.2 MG/ML IJ SOLN: 0.2000 mg | Freq: Once | INTRAMUSCULAR | Status: DC | PRN

## 2015-12-22 MED ORDER — MEPERIDINE HCL 25 MG/ML IJ SOLN: 6.2500 mg | INTRAMUSCULAR | Status: DC | PRN

## 2015-12-22 MED ORDER — ONDANSETRON HCL 4 MG/2ML IJ SOLN
INTRAMUSCULAR | Status: DC | PRN
  Administered 2015-12-22: 4 mg via INTRAVENOUS

## 2015-12-22 MED ORDER — SCOPOLAMINE 1 MG/3DAYS TD PT72: 1.0000 | MEDICATED_PATCH | Freq: Once | TRANSDERMAL | Status: DC | PRN

## 2015-12-22 MED ORDER — METOCLOPRAMIDE HCL 5 MG/ML IJ SOLN: 10.0000 mg | Freq: Once | INTRAMUSCULAR | Status: DC | PRN

## 2015-12-22 MED ORDER — HYDROCODONE-ACETAMINOPHEN 7.5-325 MG PO TABS: 1.0000 | ORAL_TABLET | Freq: Once | ORAL | Status: DC | PRN

## 2015-12-22 MED ORDER — BUPIVACAINE-EPINEPHRINE (PF) 0.5% -1:200000 IJ SOLN
INTRAMUSCULAR | Status: AC
  Filled 2015-12-22: qty 30

## 2015-12-22 MED ORDER — PROPOFOL 10 MG/ML IV BOLUS
INTRAVENOUS | Status: AC
  Filled 2015-12-22: qty 20

## 2015-12-22 MED ORDER — LACTATED RINGERS IV SOLN
INTRAVENOUS | Status: DC
  Administered 2015-12-22 (×3): via INTRAVENOUS

## 2015-12-22 MED ORDER — TECHNETIUM TC 99M SULFUR COLLOID FILTERED
1.0000 | Freq: Once | INTRAVENOUS | Status: AC | PRN
  Administered 2015-12-22: 1 via INTRADERMAL

## 2015-12-22 MED ORDER — SUGAMMADEX SODIUM 200 MG/2ML IV SOLN
INTRAVENOUS | Status: AC
  Filled 2015-12-22: qty 2

## 2015-12-22 MED ORDER — GABAPENTIN 300 MG PO CAPS
ORAL_CAPSULE | ORAL | Status: AC
  Filled 2015-12-22: qty 1

## 2015-12-22 MED ORDER — FENTANYL CITRATE (PF) 100 MCG/2ML IJ SOLN
INTRAMUSCULAR | Status: AC
  Filled 2015-12-22: qty 2

## 2015-12-22 MED ORDER — DEXAMETHASONE SODIUM PHOSPHATE 10 MG/ML IJ SOLN
INTRAMUSCULAR | Status: AC
  Filled 2015-12-22: qty 1

## 2015-12-22 MED ORDER — HYDROCODONE-ACETAMINOPHEN 5-325 MG PO TABS: 1.0000 | ORAL_TABLET | Freq: Four times a day (QID) | ORAL | Status: DC | PRN

## 2015-12-22 MED ORDER — GABAPENTIN 300 MG PO CAPS
300.0000 mg | ORAL_CAPSULE | ORAL | Status: AC
  Administered 2015-12-22: 300 mg via ORAL

## 2015-12-22 MED ORDER — ONDANSETRON HCL 4 MG/2ML IJ SOLN
INTRAMUSCULAR | Status: AC
  Filled 2015-12-22: qty 2

## 2015-12-22 MED ORDER — ARTIFICIAL TEARS OP OINT
TOPICAL_OINTMENT | OPHTHALMIC | Status: AC
  Filled 2015-12-22: qty 3.5

## 2015-12-22 MED ORDER — FENTANYL CITRATE (PF) 100 MCG/2ML IJ SOLN: 25.0000 ug | INTRAMUSCULAR | Status: DC | PRN

## 2015-12-22 MED ORDER — LIDOCAINE 2% (20 MG/ML) 5 ML SYRINGE
INTRAMUSCULAR | Status: AC
  Filled 2015-12-22: qty 5

## 2015-12-22 MED ORDER — EPHEDRINE SULFATE-NACL 50-0.9 MG/10ML-% IV SOSY
PREFILLED_SYRINGE | INTRAVENOUS | Status: DC | PRN
  Administered 2015-12-22: 10 mg via INTRAVENOUS

## 2015-12-22 MED ORDER — ACETAMINOPHEN 500 MG PO TABS
ORAL_TABLET | ORAL | Status: AC
  Filled 2015-12-22: qty 2

## 2015-12-22 MED ORDER — ACETAMINOPHEN 500 MG PO TABS
1000.0000 mg | ORAL_TABLET | ORAL | Status: AC
  Administered 2015-12-22: 1000 mg via ORAL

## 2015-12-22 MED ORDER — CELECOXIB 200 MG PO CAPS
ORAL_CAPSULE | ORAL | Status: AC
  Filled 2015-12-22: qty 2

## 2015-12-22 MED ORDER — SODIUM CHLORIDE 0.9 % IJ SOLN
INTRAMUSCULAR | Status: AC
  Filled 2015-12-22: qty 10

## 2015-12-22 MED ORDER — CELECOXIB 400 MG PO CAPS
400.0000 mg | ORAL_CAPSULE | ORAL | Status: AC
  Administered 2015-12-22: 400 mg via ORAL

## 2015-12-22 SURGICAL SUPPLY — 54 items
BENZOIN TINCTURE PRP APPL 2/3 (GAUZE/BANDAGES/DRESSINGS) IMPLANT
BINDER BREAST LRG (GAUZE/BANDAGES/DRESSINGS) IMPLANT
BINDER BREAST MEDIUM (GAUZE/BANDAGES/DRESSINGS) ×3 IMPLANT
BINDER BREAST XLRG (GAUZE/BANDAGES/DRESSINGS) IMPLANT
BINDER BREAST XXLRG (GAUZE/BANDAGES/DRESSINGS) IMPLANT
BLADE SURG 15 STRL LF DISP TIS (BLADE) ×1 IMPLANT
BLADE SURG 15 STRL SS (BLADE) ×3
CANISTER SUC SOCK COL 7IN (MISCELLANEOUS) IMPLANT
CANISTER SUCT 1200ML W/VALVE (MISCELLANEOUS) IMPLANT
CHLORAPREP W/TINT 26ML (MISCELLANEOUS) ×3 IMPLANT
CLIP TI WIDE RED SMALL 6 (CLIP) ×3 IMPLANT
CLOSURE WOUND 1/2 X4 (GAUZE/BANDAGES/DRESSINGS)
COVER BACK TABLE 60X90IN (DRAPES) ×3 IMPLANT
COVER MAYO STAND STRL (DRAPES) ×3 IMPLANT
COVER PROBE W GEL 5X96 (DRAPES) ×3 IMPLANT
DECANTER SPIKE VIAL GLASS SM (MISCELLANEOUS) IMPLANT
DEVICE DUBIN W/COMP PLATE 8390 (MISCELLANEOUS) ×3 IMPLANT
DRAPE LAPAROSCOPIC ABDOMINAL (DRAPES) ×3 IMPLANT
DRAPE UTILITY XL STRL (DRAPES) ×3 IMPLANT
DRSG PAD ABDOMINAL 8X10 ST (GAUZE/BANDAGES/DRESSINGS) IMPLANT
ELECT COATED BLADE 2.86 ST (ELECTRODE) ×3 IMPLANT
ELECT REM PT RETURN 9FT ADLT (ELECTROSURGICAL) ×3
ELECTRODE REM PT RTRN 9FT ADLT (ELECTROSURGICAL) ×1 IMPLANT
GAUZE SPONGE 4X4 12PLY STRL (GAUZE/BANDAGES/DRESSINGS) IMPLANT
GLOVE BIOGEL PI IND STRL 7.0 (GLOVE) ×3 IMPLANT
GLOVE BIOGEL PI INDICATOR 7.0 (GLOVE) ×6
GLOVE SURG SIGNA 7.5 PF LTX (GLOVE) IMPLANT
GLOVE SURG SS PI 6.5 STRL IVOR (GLOVE) ×3 IMPLANT
GLOVE SURG SS PI 7.0 STRL IVOR (GLOVE) ×3 IMPLANT
GLOVE SURG SS PI 7.5 STRL IVOR (GLOVE) ×6 IMPLANT
GOWN STRL REUS W/ TWL LRG LVL3 (GOWN DISPOSABLE) ×2 IMPLANT
GOWN STRL REUS W/ TWL XL LVL3 (GOWN DISPOSABLE) ×1 IMPLANT
GOWN STRL REUS W/TWL LRG LVL3 (GOWN DISPOSABLE) ×4
GOWN STRL REUS W/TWL XL LVL3 (GOWN DISPOSABLE) ×3
KIT MARKER MARGIN INK (KITS) ×3 IMPLANT
LIQUID BAND (GAUZE/BANDAGES/DRESSINGS) ×3 IMPLANT
NDL SAFETY ECLIPSE 18X1.5 (NEEDLE) IMPLANT
NEEDLE HYPO 18GX1.5 SHARP (NEEDLE)
NEEDLE HYPO 25X1 1.5 SAFETY (NEEDLE) ×3 IMPLANT
NS IRRIG 1000ML POUR BTL (IV SOLUTION) IMPLANT
PACK BASIN DAY SURGERY FS (CUSTOM PROCEDURE TRAY) ×3 IMPLANT
PENCIL BUTTON HOLSTER BLD 10FT (ELECTRODE) ×3 IMPLANT
SHEET MEDIUM DRAPE 40X70 STRL (DRAPES) ×3 IMPLANT
SLEEVE SCD COMPRESS KNEE MED (MISCELLANEOUS) ×3 IMPLANT
SPONGE LAP 18X18 X RAY DECT (DISPOSABLE) ×3 IMPLANT
STRIP CLOSURE SKIN 1/2X4 (GAUZE/BANDAGES/DRESSINGS) IMPLANT
SUT MNCRL AB 4-0 PS2 18 (SUTURE) ×6 IMPLANT
SUT VICRYL 3-0 CR8 SH (SUTURE) ×6 IMPLANT
SYR CONTROL 10ML LL (SYRINGE) ×3 IMPLANT
TOWEL OR 17X24 6PK STRL BLUE (TOWEL DISPOSABLE) ×3 IMPLANT
TOWEL OR NON WOVEN STRL DISP B (DISPOSABLE) ×3 IMPLANT
TUBE CONNECTING 20'X1/4 (TUBING)
TUBE CONNECTING 20X1/4 (TUBING) IMPLANT
YANKAUER SUCT BULB TIP NO VENT (SUCTIONS) ×3 IMPLANT

## 2015-12-22 NOTE — Transfer of Care (Signed)
Immediate Anesthesia Transfer of Care Note  Patient: Alexis [name]  Procedure(s) Performed: Procedure(s): BREAST LUMPECTOMY WITH RADIOACTIVE SEED AND SENTINEL LYMPH NODE BIOPSY (Right)  Patient Location: PACU  Anesthesia Type:General  Level of Consciousness: sedated  Airway & Oxygen Therapy: Patient Spontanous Breathing and Patient connected to face mask oxygen  Post-op Assessment: Report given to RN and Post -op Vital signs reviewed and stable  Post vital signs: Reviewed and stable  Last Vitals:  Filed Vitals:   12/22/15 1015 12/22/15 1029  BP: 130/53   Pulse: 61 60  Temp:    Resp: 17 25    Last Pain: There were no vitals filed for this visit.       Complications: No apparent anesthesia complications

## 2015-12-22 NOTE — Interval H&P Note (Signed)
History and Physical Interval Note:  12/22/2015 10:33 AM  Alexis [name]  has presented today for surgery, with the diagnosis of RIGHT BREAST CANCER  The various methods of treatment have been discussed with the patient and family.  Sister in law at bedside.  Her husband is here.  Seed is good position.  After consideration of risks, benefits and other options for treatment, the patient has consented to  Procedure(s): BREAST LUMPECTOMY WITH RADIOACTIVE SEED AND SENTINEL LYMPH NODE BIOPSY (Right) as a surgical intervention .  The patient's history has been reviewed, patient examined, no change in status, stable for surgery.  I have reviewed the patient's chart and labs.  Questions were answered to the patient's satisfaction.     [name]

## 2015-12-22 NOTE — Progress Notes (Signed)
Assisted Dr. Royce Macadamia with right, ultrasound guided, pectoralis block and nuc med med tech with nuc med inj. Side rails up, monitors on throughout procedure. See vital signs in flow sheet. Tolerated Procedure well.

## 2015-12-22 NOTE — Op Note (Signed)
12/22/2015  12:05 PM  PATIENT:  Alexis [name] DOB: 08-Feb-1951 MRN: [id number]  PREOP DIAGNOSIS:  RIGHT BREAST CANCER  POSTOP DIAGNOSIS:   Right breast cancer, 12 o'clock position (T1, N0)  PROCEDURE:   Procedure(s):  Right BREAST LUMPECTOMY WITH RADIOACTIVE SEED AND SENTINEL LYMPH NODE BIOPSY  SURGEON:   Ovidio Kin, M.D.  ANESTHESIA:   general  Anesthesiologist: Mal Amabile, MD CRNA: April W Carter, CRNA  General  EBL:  minimal  ml  DRAINS: none   LOCAL MEDICATIONS USED:   10 cc 1/4% marcaine, rigth pectoral block by anesthesia  SPECIMEN:   Right breast lumpectomy, right axillary sentinel lymph node (counts 270, background 5)  COUNTS CORRECT:  YES  INDICATIONS FOR PROCEDURE:  Alexis [name] is a 65 y.o. (DOB: [birth year]) white  female whose primary care physician is Sandrea Hughs, MD and comes for right breast lumpectomy and right axillary sentinel lymph node biopsy.          [facility]2015 with Drs. Magrinat and Bennington.   The options for breast cancer treatment have been discussed with the patient. She elected to proceed with lumpectomy and axillary sentinel lymph node.     The indications and potential complications of surgery were explained to the patient. Potential complications include, but are not limited to, bleeding, infection, the need for further surgery, and nerve injury.     She had a I131 seed placed on 12/21/2015 in her right breast at [facility].  I confirmed the presence of the I131 seed in the pre op area using the Neoprobe.  The seed is in the 12 o'clock position of the right breast.   In the holding area, her right areola was injected with 1 millicurie of Technitium Sulfur Colloid.  OPERATIVE NOTE:   The patient was taken to room # 6 at [facility] where she underwent a general anesthesia  supervised by Anesthesiologist: Mal Amabile, MD CRNA: April W Carter, CRNA. Her right breast and axilla were  prepped with  ChloraPrep and sterilely draped.    A time-out and the surgical check list was reviewed.    I turned attention to the cancer which was about at the 12 o'clock position of the right breast.   I used the Neoprobe to identify the I131 seed.  I tried to excise an area around the tumor of at least 1 cm.    I excised this block of breast tissue approximately 3 cm by 4 cm  in diameter.  I went down to the chest wall with the lumpectomy and went under the skin anteriorly.   If there is a margin that is close, I think it would be the anterior margin.  I painted the lumpectomy specimen with the 6 color paint kit and did a specimen mammogram which confirmed the mass, clip, and the seed were all in the right position in the specimen.  The specimen was sent to pathology who called back to confirm that they have the seed and the specimen.   I then started the right axillary sentinel lymph node biopsy. I made an incision in the rightright axilla.  I found a hot area at the junction of the breast and the pectoralis major muscle. I cut down and  identified a hot node that had counts of 270 and the background has 5 counts.  I checked her internal mammary nodes and supraclavicular nodes with the neoprobe and found no other  hot area. The axillary node was then sent to pathology.    I then irrigated the wound with saline. I infiltrated approximately 10 mL of 1% local in the breast incision.  She had had a right pectoral block by anesthesia prior to surgery.   I placed 5 clips to mark biopsy cavity, at 12, 3, 6, and 9 o'clock. And a clip placed on the pectoralis major.   I then closed all the wounds in layers using 3-0 Vicryl sutures for the deep layer. At the skin, I closed the incisions with a 4-0 Monocryl suture. The incisions were then painted with LiquiBand.  She had gauze place over the wounds and placed in a breast binder.   The patient tolerated the procedure well, was transported to the recovery room in  good condition. Sponge and needle count were correct at the end of the case.   Final pathology is pending.   Alphonsa Overall, MD, [facility] Surgery Pager: [phone] Office phone:  [phone]

## 2015-12-22 NOTE — Discharge Instructions (Signed)
CENTRAL  SURGERY - DISCHARGE INSTRUCTIONS TO PATIENT  Activity:  Driving - May drive in one or 2 days.   Lifting - No lifting more than 15 pounds for 7 days, then no limit  Wound Care:   Leave bandage on for 2 days, then may remove and shower.         No public water (pool, lake, etc) for 3 weeks.         Place ice pack to right breast every 4 to 6 hours for the first couple of days  Diet:  As tolerated  Follow up appointment:  Call Dr. Pollie Friar office [facility]) at [phone] for an appointment in 2 to 3 weeks.  Medications and dosages:  Resume your home medications.  You have a prescription for:  Vicodin  Call Dr. Lucia Gaskins or his office  [phone]) if you have:  Temperature greater than 100.4,  Severe uncontrolled pain,  Redness, tenderness, or signs of infection (pain, swelling, redness, odor or green/yellow discharge around the site),  Difficulty breathing, headache or visual disturbances,  Any other questions or concerns you may have after discharge.  In an emergency, call 911 or go to an Emergency Department at a nearby hospital.    Post Anesthesia Home Care Instructions  Activity: Get plenty of rest for the remainder of the day. A responsible adult should stay with you for 24 hours following the procedure.  For the next 24 hours, DO NOT: -Drive a car -Paediatric nurse -Drink alcoholic beverages -Take any medication unless instructed by your physician -Make any legal decisions or sign important papers.  Meals: Start with liquid foods such as gelatin or soup. Progress to regular foods as tolerated. Avoid greasy, spicy, heavy foods. If nausea and/or vomiting occur, drink only clear liquids until the nausea and/or vomiting subsides. Call your physician if vomiting continues.  Special Instructions/Symptoms: Your throat may feel dry or sore from the anesthesia or the breathing tube placed in your throat during surgery. If this causes discomfort,  gargle with warm salt water. The discomfort should disappear within 24 hours.  If you had a scopolamine patch placed behind your ear for the management of post- operative nausea and/or vomiting:  1. The medication in the patch is effective for 72 hours, after which it should be removed.  Wrap patch in a tissue and discard in the trash. Wash hands thoroughly with soap and water. 2. You may remove the patch earlier than 72 hours if you experience unpleasant side effects which may include dry mouth, dizziness or visual disturbances. 3. Avoid touching the patch. Wash your hands with soap and water after contact with the patch.

## 2015-12-22 NOTE — Anesthesia Preprocedure Evaluation (Addendum)
Anesthesia Evaluation  Patient identified by MRN, date of birth, ID band Patient awake    Reviewed: Allergy & Precautions, NPO status , Patient's Chart, lab work & pertinent test results  Airway Mallampati: II  TM Distance: >3 FB Neck ROM: Full    Dental no notable dental hx. (+) Teeth Intact   Pulmonary neg pulmonary ROS,    Pulmonary exam normal breath sounds clear to auscultation       Cardiovascular negative cardio ROS Normal cardiovascular exam Rhythm:Regular Rate:Normal     Neuro/Psych PSYCHIATRIC DISORDERS negative neurological ROS     GI/Hepatic Neg liver ROS, GERD  Controlled and Medicated,  Endo/Other  Right Breast Ca Hyperlipidemia  Renal/GU negative Renal ROS  negative genitourinary   Musculoskeletal negative musculoskeletal ROS (+)   Abdominal   Peds  Hematology  (+) anemia ,   Anesthesia Other Findings   Reproductive/Obstetrics                            Anesthesia Physical Anesthesia Plan  ASA: II  Anesthesia Plan: General and Regional   Post-op Pain Management: GA combined w/ Regional for post-op pain   Induction: Intravenous  Airway Management Planned: LMA and Oral ETT  Additional Equipment:   Intra-op Plan:   Post-operative Plan: Extubation in OR  Informed Consent: I have reviewed the patients History and Physical, chart, labs and discussed the procedure including the risks, benefits and alternatives for the proposed anesthesia with the patient or authorized representative who has indicated his/her understanding and acceptance.   Dental advisory given  Plan Discussed with: CRNA, Anesthesiologist and Surgeon  Anesthesia Plan Comments:        Anesthesia Quick Evaluation

## 2015-12-22 NOTE — Anesthesia Postprocedure Evaluation (Signed)
Anesthesia Post Note  Patient: Alexis [name]  Procedure(s) Performed: Procedure(s) (LRB): BREAST LUMPECTOMY WITH RADIOACTIVE SEED AND SENTINEL LYMPH NODE BIOPSY (Right)  Patient location during evaluation: PACU Anesthesia Type: General and Regional Level of consciousness: awake and alert and oriented Pain management: pain level controlled Vital Signs Assessment: post-procedure vital signs reviewed and stable Respiratory status: spontaneous breathing, nonlabored ventilation and respiratory function stable Cardiovascular status: blood pressure returned to baseline and stable Postop Assessment: no signs of nausea or vomiting Anesthetic complications: no    Last Vitals:  Filed Vitals:   12/22/15 1029 12/22/15 1215  BP:    Pulse: 60   Temp:  36.6 C  Resp: 25     Last Pain: There were no vitals filed for this visit.               Jaquelyn Sakamoto A.

## 2015-12-22 NOTE — Anesthesia Procedure Notes (Addendum)
Anesthesia Regional Block:  Pectoralis block  Pre-Anesthetic Checklist: ,, timeout performed, Correct Patient, Correct Site, Correct Laterality, Correct Procedure, Correct Position, site marked, Risks and benefits discussed,  Surgical consent,  Pre-op evaluation,  At surgeon's request and post-op pain management  Laterality: Right  Prep: chloraprep       Needles:  Injection technique: Single-shot  Needle Type: Echogenic Stimulator Needle     Needle Length: 9cm 9 cm Needle Gauge: 21 and 21 G  Needle insertion depth: 3 cm   Additional Needles:  Procedures: ultrasound guided (picture in chart) Pectoralis block Narrative:  Start time: 12/22/2015 10:00 AM End time: 12/22/2015 10:06 AM Injection made incrementally with aspirations every 5 mL.  Performed by: Personally  Anesthesiologist: Josephine Igo  Additional Notes: Relevant anatomy ID'd by [place]. Incremental 26ml injection with frequent aspiration. Patient tolerated procedure well.   Procedure Name: LMA Insertion Date/Time: 12/22/2015 10:40 AM Performed by: Lieutenant Diego Pre-anesthesia Checklist: Patient identified, Emergency Drugs available, Suction available and Patient being monitored Patient Re-evaluated:Patient Re-evaluated prior to inductionOxygen Delivery Method: Circle system utilized Preoxygenation: Pre-oxygenation with 100% oxygen Intubation Type: IV induction Ventilation: Mask ventilation without difficulty LMA: LMA inserted LMA Size: 4.0 Number of attempts: 1 Airway Equipment and Method: Bite block Placement Confirmation: positive ETCO2 and breath sounds checked- equal and bilateral Tube secured with: Tape Dental Injury: Teeth and Oropharynx as per pre-operative assessment

## 2015-12-26 ENCOUNTER — Other Ambulatory Visit: Payer: Self-pay | Admitting: Oncology

## 2015-12-26 ENCOUNTER — Telehealth: Payer: Self-pay | Admitting: *Deleted

## 2015-12-26 NOTE — Telephone Encounter (Signed)
Received oncotype order per Dr. Jana Hakim. Requisition sent to pathology. Received by Varney Biles. PAC sent to [facility]

## 2015-12-29 ENCOUNTER — Encounter (HOSPITAL_BASED_OUTPATIENT_CLINIC_OR_DEPARTMENT_OTHER): Payer: Self-pay | Admitting: Surgery

## 2015-12-31 ENCOUNTER — Other Ambulatory Visit: Payer: Self-pay | Admitting: Oncology

## 2016-01-05 ENCOUNTER — Telehealth: Payer: Self-pay | Admitting: *Deleted

## 2016-01-05 ENCOUNTER — Encounter (HOSPITAL_COMMUNITY): Payer: Self-pay

## 2016-01-05 NOTE — Telephone Encounter (Signed)
Received Oncotype Score of 21/14% Physician team notified.

## 2016-01-11 ENCOUNTER — Ambulatory Visit (HOSPITAL_BASED_OUTPATIENT_CLINIC_OR_DEPARTMENT_OTHER): Payer: BLUE CROSS/BLUE SHIELD | Admitting: Oncology

## 2016-01-11 ENCOUNTER — Encounter: Payer: Self-pay | Admitting: Genetic Counselor

## 2016-01-11 ENCOUNTER — Telehealth: Payer: Self-pay | Admitting: Oncology

## 2016-01-11 VITALS — BP 156/66 | HR 62 | Temp 98.3°F | Resp 18 | Ht 65.0 in | Wt 119.8 lb

## 2016-01-11 DIAGNOSIS — C50411 Malignant neoplasm of upper-outer quadrant of right female breast: Secondary | ICD-10-CM | POA: Diagnosis not present

## 2016-01-11 NOTE — Telephone Encounter (Signed)
appt made and avs printed. Referral to be placed for appt with dr Lisbeth Renshaw

## 2016-01-11 NOTE — Progress Notes (Signed)
[place]  [phone] [phone]     ID: Alexis [name] DOB: 12-18-1950  MR#: [id number]  QZR#:[id number]  Patient Care Team: Tanda Rockers, MD as PCP - General (Pulmonary Disease) Alphonsa Overall, MD as Consulting Physician (General Surgery) Chauncey Cruel, MD as Consulting Physician (Oncology) Kyung Rudd, MD as Consulting Physician (Radiation Oncology) Leeroy Cha, MD as Consulting Physician (Neurosurgery) Allyn Kenner, MD (Dermatology) Wallene Huh, DPM as Consulting Physician (Podiatry) PCP: Christinia Gully, MD OTHER MD:  CHIEF COMPLAINT: estrogen receptor positive breast cancer  CURRENT TREATMENT:  Adjuvant radiation pending   BREAST CANCER HISTORY: From the original intake note:  Alexis [name] had routine mammographic screening with tomography at the [facility] 11/04/2015. This showed an area of distortion and calcifications in the right breast.  Right diagnostic mammography with tomography and right breast ultrasonography at the [facility] 11/15/2015 on the breast density to be categoryC.  In the upper-outer quadrant of the right breast there was an area of distortion associated with pleomorphic calcifications measuring approximately 1 cm. On exam there was no palpable mass.  By ultrasonography, there was an irregular hypoechoic mass superiorly measuring 1.2 cm. Ultrasound of the axilla was benign.  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  Her subsequent history is as detailed below  INTERVAL HISTORY: Alexis [name] returns today for follow-up of her early stage estrogen receptor positive breast cancer.  Since her last visit here she underwent right  lumpectomy and sentinel lymph node sampling, on 12/22/2015. The final pathology ([id number]) showed an invasive ductal carcinoma measuring 1.1 cm, with repeat HER-2 again negative, the signals ratio being 1.33 and the number per cell 1.80. The in situ portion of the tumor was less than 0.1 cm from the anterior margin. The single sentinel lymph node was negative.  Oncotype DX was obtained from this tumor, showing a recurrence score of 21, predicting a 10 year risk of recurrence outside the breast of 14% if the patient's only systemic therapy is tamoxifen for 5 years. It also predicts a benefit from chemotherapy in the 3-4% range.  Her case was presented at conference this morning. At that time it was felt that the anterior margin was skin and therefore no further surgery was needed.  REVIEW OF SYSTEMS: Alexis [name] tolerated her surgery well, with no unusual pain, bleeding, or fever. She is "trying to get back into the normal schedule". A detailed review of systems today was otherwise noncontributory  PAST MEDICAL HISTORY: Past Medical History:  Diagnosis Date  . Breast cancer ([place])   . Chronic fatigue   . Cough   . GERD (gastroesophageal reflux disease)   . Hemangioma   . Hyperlipidemia   . IBS (irritable bowel syndrome)   . Iron deficiency anemia   . Kyphosis of thoracic region   . MVP (mitral valve prolapse)   . Osteopenia   . Vertigo     PAST SURGICAL HISTORY: Past Surgical History:  Procedure Laterality Date  . APPENDECTOMY    . BREAST LUMPECTOMY WITH RADIOACTIVE SEED AND SENTINEL LYMPH NODE BIOPSY Right 12/22/2015   Procedure: BREAST LUMPECTOMY WITH RADIOACTIVE SEED AND SENTINEL LYMPH NODE BIOPSY;  Surgeon: Alphonsa Overall, MD;  Location: [place];  Service: General;  Laterality: Right;  . FOOT SURGERY Right    bunionectomy  . [facility]  2003   s/p embolization per Dr. Estanislado Pandy  . TONSILLECTOMY    . TUBAL LIGATION      FAMILY HISTORY Family History  Problem Relation  Age of Onset  . Hyperlipidemia Mother   . Breast cancer Mother   . Heart attack Father   . Diabetes Brother   . Breast cancer Maternal Aunt   . Breast cancer Paternal Aunt   the patient's father died from a ruptured aneurysm at the age of 54. The patient's mother was diagnosed with breast cancer at the age of 70 and died at age 63. The patient has a paternal aunt diagnosed with breast cancer at age 55. The patient has 2 brothers, 2 sisters. Ne brother has had a history of aneurysms, as has the patient There is no history of ovarian cancer in the family.  GYNECOLOGIC HISTORY:  No LMP recorded. Patient is postmenopausal. Menarche age 64, first live birth age 70, the patient is Alexis [name] P2. She stopped having periods at age 46.He did not take hormone replacement. 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 Daughter Alexis [name] lives in [place] where she works as an Medical illustrator. (Adopted) son Alexis [name] lives in [place] where he is a Museum/gallery curator. 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 The patient has 3 grandchildren aged 57, 30, and 64. She attends a [employer]    ADVANCED DIRECTIVES: not in place   HEALTH MAINTENANCE: Social History  Substance Use Topics  . Smoking status: Never Smoker  . Smokeless tobacco: Never Used  . Alcohol use No     Colonoscopy: 2012/[facility]  PAP:2012  Bone density: 2015  Lipid panel:  Allergies  Allergen Reactions  . Latex Rash    Contact rash    Current Outpatient Prescriptions  Medication Sig Dispense Refill  . aspirin 81 MG tablet Take 81 mg by mouth daily.     Marland Kitchen b complex vitamins tablet Take 1 tablet by mouth daily.    . calcium citrate-vitamin D (CITRACAL+D) 315-200 MG-UNIT tablet Take 1 tablet by mouth daily.    . famotidine ([employer]  ACID CONTROLLER MAX ST) 20 MG tablet Take 20 mg by mouth 2 (two) times daily.    Marland Kitchen HYDROcodone-acetaminophen (NORCO/VICODIN) 5-325 MG tablet Take 1-2 tablets by mouth every 6 (six) hours as needed. 30 tablet 0  . iron polysaccharides (NIFEREX) 150 MG capsule Take 1 capsule (150 mg total) by mouth daily. 30 capsule 2  . Naproxen (NAPROXEN) 375 MG TBEC Take by mouth.    . pantoprazole (PROTONIX) 40 MG tablet TAKE 1 BY MOUTH DAILY 90 tablet 3  . pravastatin (PRAVACHOL) 40 MG tablet TAKE 1 BY MOUTH EVERY EVENING 90 tablet 3   No current facility-administered medications for this visit.     OBJECTIVE: middle-aged white woman In no acute distress Vitals:   01/11/16 1009  BP: (!) 156/66  Pulse: 62  Resp: 18  Temp: 98.3 F (36.8 C)     Body mass index is 19.94 kg/m.    ECOG FS:0 - Asymptomatic  Sclerae unicteric, pupils round and equal Oropharynx clear and moist-- no thrush or other lesions No cervical or supraclavicular adenopathy Lungs no rales or rhonchi Heart regular rate and rhythm Abd  soft, nontender, positive bowel sounds MSK no focal spinal tenderness, no upper extremity lymphedema Neuro: nonfocal, well oriented, appropriate affect Breasts: The right breast is status post recent lumpectomy. The incision is healing nicely without dehiscence, erythema, or swelling. The cosmetic result is good. The right axilla is benign. The left breast is unremarkable.   LAB RESULTS:  CMP     Component Value Date/Time   NA 143 11/23/2015 0832   K 3.6 11/23/2015 0832   CL 103 04/21/2015 0932   CO2 28 11/23/2015 0832   GLUCOSE 73 11/23/2015 0832   GLUCOSE 106 (H) 05/24/2006 0950   BUN 14.7 11/23/2015 0832   CREATININE 0.8 11/23/2015 0832   CALCIUM 9.3 11/23/2015 0832   PROT 7.0 11/23/2015 0832   ALBUMIN 3.6 11/23/2015 0832   AST 20 11/23/2015 0832   ALT 16 11/23/2015 0832   ALKPHOS 82 11/23/2015 0832   BILITOT 0.40 11/23/2015 0832   GFRNONAA 90.91 06/24/2009 0931   GFRAA 111  06/07/2008 0909    INo results found for: SPEP, UPEP  Lab Results  Component Value Date   WBC 4.4 11/23/2015   NEUTROABS 2.6 11/23/2015   HGB 11.6 11/23/2015   HCT 36.1 11/23/2015   MCV 82.6 11/23/2015   PLT 183 11/23/2015      Chemistry      Component Value Date/Time   NA 143 11/23/2015 0832   K 3.6 11/23/2015 0832   CL 103 04/21/2015 0932   CO2 28 11/23/2015 0832   BUN 14.7 11/23/2015 0832   CREATININE 0.8 11/23/2015 0832      Component Value Date/Time   CALCIUM 9.3 11/23/2015 0832   ALKPHOS 82 11/23/2015 0832   AST 20 11/23/2015 0832   ALT 16 11/23/2015 0832   BILITOT 0.40 11/23/2015 0832       No results found for: LABCA2  No components found for: LABCA125  No results for input(s): INR in the last 168 hours.  Urinalysis    Component Value Date/Time   COLORURINE YELLOW 04/21/2015 0932   APPEARANCEUR CLEAR 04/21/2015 0932   LABSPEC 1.010 04/21/2015 0932   PHURINE 6.0 04/21/2015 0932   GLUCOSEU NEGATIVE 04/21/2015 0932   HGBUR NEGATIVE 04/21/2015 0932   BILIRUBINUR NEGATIVE 04/21/2015 0932   KETONESUR NEGATIVE 04/21/2015 0932   UROBILINOGEN 0.2 04/21/2015 0932   NITRITE NEGATIVE 04/21/2015 0932   LEUKOCYTESUR NEGATIVE 04/21/2015 0932      ELIGIBLE FOR AVAILABLE RESEARCH PROTOCOL: no  STUDIES: Nm Sentinel Node Inj-no Rpt (breast)  Result Date: 12/22/2015 CLINICAL DATA: Right breast cancer Sulfur colloid was injected intradermally by the nuclear medicine technologist for breast cancer sentinel node localization.   Mm Breast Surgical Specimen  Result Date: 12/22/2015 CLINICAL DATA:  Status post right lumpectomy EXAM: SPECIMEN RADIOGRAPH OF THE RIGHT BREAST COMPARISON:  Previous exam(s). FINDINGS: Status post excision of the right breast. The radioactive seed and ribbon shaped biopsy marker clip are present, completely intact, and were marked for pathology. Findings were communicated to the operating room at the time of interpretation IMPRESSION:  Specimen radiograph of the right breast. Electronically Signed   By: Pamelia Hoit M.D.   On: 12/22/2015 11:31   Mm Rt Radioactive Seed Loc Mammo Guide  Result Date: 12/21/2015 CLINICAL DATA:  Patient with recently diagnosed right breast cancer. Patient is scheduled for breast conservation surgery requiring radioactive seed localization. EXAM: MAMMOGRAPHIC GUIDED RADIOACTIVE SEED LOCALIZATION OF THE RIGHT BREAST COMPARISON:  Previous exam(s). FINDINGS: Patient presents for radioactive seed localization prior to breast conservation surgery. I  met with the patient and we discussed the procedure of seed localization including benefits and alternatives. We discussed the high likelihood of a successful procedure. We discussed the risks of the procedure including infection, bleeding, tissue injury and further surgery. We discussed the low dose of radioactivity involved in the procedure. Informed, written consent was given. The usual time-out protocol was performed immediately prior to the procedure. Using mammographic guidance, sterile technique, 1% lidocaine and an I-125 radioactive seed, the ribbon shaped clip was localized using a superior approach. The follow-up mammogram images confirm the seed in the expected location and were marked for Dr. Lucia Gaskins. Follow-up survey of the patient confirms presence of the radioactive seed. Order number of I-125 seed:  [id number]. Total activity:  9.563 millicuries  Reference Date: 12/08/2015 The patient tolerated the procedure well and was released from the [facility]. She was given instructions regarding seed removal. IMPRESSION: Radioactive seed localization right breast. No apparent complications. Electronically Signed   By: Franki Cabot M.D.   On: 12/21/2015 13:58    ASSESSMENT: 65 y.o. [place], [place] woman status post right breast upper outer quadrant biopsy 11/16/2015 for a clinical T1b N0, stage IA invasive ductal carcinoma, grade 1, estrogen receptor positive,  progesterone receptor negative, HER-2 nonamplified, with an MIB-1 of 5%.  (1) status post right lumpectomy 12/22/2015 for a pT1c pN0, stage IA  invasive ductal carcinoma, grade 1, repeat HER-2 again negative  (2) Oncotype DX score of 21 predicts a risk of recurrence outside the breast over the next 10 years of 14% if the patient's only systemic therapy is tamoxifen for 5 years. It predicts a benefit from chemotherapy in the 3-4% range.  (3) adjuvant radiation to follow  (4) anti-estrogens to follow at the completion of local treatment  (a) bone density at [facility] healthcare 04/16/2014 found osteopenia with a T score of -1.6  (5) the patient does not meet criteria for genetics testing    PLAN: We spent approximately 35 minutes today discussing Alexis [name]'s diagnosis and prognosis. She understands she has an early stage estrogen receptor positive breast cancer which is not fast growing and nonaggressive looking under the microscope. In general cancers like hers, which meet a luminal A phenotype, do well without chemotherapy.  The Oncotype falls in the intermediate range. It is not inappropriate to repeat the Oncotype score as a continue on and currently most physicians are using 25 as the breakpoint, so if the score is 25 or greater the group the tumor with a high risk group and if it is 24 or less they group but with a low risk group.  Another way to look at her risk of recurrence outside the breast is too analyze the B-20 data also provided by Oncotype. This suggests a benefit from chemotherapy in that population between 3 and 4% area  Our practice is to recommend chemotherapy if the benefit is 5% or greater. Her case of course is marginal. We discussed the pros and cons in detail and she also has a good understanding of the possible toxicities side effects and complications of the agents we would be using. After this discussion she was very clear she was not motivated to receive chemotherapy or the  small possible marginal benefit.  In fact she can do better than the 14% risk of recurrence by taking anastrozole for a minimum of 2 years in addition to tamoxifen for an additional 3 years, or by taking anastrozole for 5 years. This should reduce her risk of recurrence and  another 2-3%.  In short, she is comfortable foregoing chemotherapy and I believe this is a reasonable decision on her part. She is now ready to proceed to radiation.  She should see radiation oncology next week. She will return to see me in approximately 6 weeks by which time she should be nearing completion of the radiation treatments and we can discuss starting anti-estrogens  Alexis [name] has a good understanding of this plan. She agrees with it. She knows the goal of treatment in her case is cure. She will call with any problems that may develop before her next visit here.   Chauncey Cruel, MD   01/14/2016 7:17 AM Medical Oncology and Hematology [facility] [address] [place], [place] [address] Tel. [phone]    Fax. [phone]

## 2016-01-14 NOTE — Addendum Note (Signed)
Addended by: Chauncey Cruel on: 01/14/2016 07:32 AM   Modules accepted: Orders

## 2016-01-24 ENCOUNTER — Telehealth: Payer: Self-pay | Admitting: Internal Medicine

## 2016-01-24 NOTE — Telephone Encounter (Signed)
LMTCB x 1 

## 2016-01-25 NOTE — Progress Notes (Signed)
Location of Breast Cancer:Right upper-outer female breast  Histology per Pathology Report: 12-22-15 Diagnosis 1. Breast, lumpectomy, Right - INVASIVE AND IN SITU DUCTAL CARCINOMA WITH CALCIFICATIONS, 1.1 CM. - TUMOR FOCALLY LESS THAN 0.1 CM FROM ANTERIOR MARGIN - FIBROCYSTIC CHANGES WITH CALCIFICATIONS AND DUCTAL PAPILLOMA. - BIOPSY SITE REACTION. 2. Lymph node, sentinel, biopsy, Right axillary - ONE BENIGN LYMPH NODE (0/1).    Receptor Status: ER(100% +), PR (0% -), Her2-neu (-), Ki-(5%)  Did patient present with symptoms (if so, please note symptoms) or was this found on screening mammography?: Routine mammographic screening with tomography at the [place] 11/04/2015. This showed an area of distortion and calcifications in the right breast. By ultrasonography, there was an irregular hypoechoic mass superiorly measuring 1.2 cm. Ultrasound of the axilla was benign.  Past/Anticipated interventions by surgeon, if any:07-06-17Right lumpectomy SLN biopsy  Past/Anticipated interventions by medical oncology, if any: Oncotype DX was obtained from this tumor, showing a recurrence score of 21  Lymphedema issues, if any: None  Pain issues, if any:  None at this time  SAFETY ISSUES:  Prior radiation? No  Pacemaker/ICD? No  Possible current pregnancy? : No  Is the patient on methotrexate? No  Current Complaints / other details:  Menarche age 65, first live birth age 65, the patient is G2 P2. She stopped having periods at age 65.He did not take hormone replacement. She did take oral contraceptives for about 20 years with no complications    Georgena Spurling, RN 01/25/2016,5:36 PM

## 2016-01-25 NOTE — Telephone Encounter (Signed)
Attempted to contact pt. No answer, no option to leave a message. Will try back.  

## 2016-01-26 ENCOUNTER — Encounter: Payer: Self-pay | Admitting: Radiation Oncology

## 2016-01-26 ENCOUNTER — Ambulatory Visit
Admission: RE | Admit: 2016-01-26 | Discharge: 2016-01-26 | Disposition: A | Discharge status: Home / Self Care | Payer: BLUE CROSS/BLUE SHIELD | Source: Ambulatory Visit | Attending: Radiation Oncology | Admitting: Radiation Oncology

## 2016-01-26 DIAGNOSIS — C50411 Malignant neoplasm of upper-outer quadrant of right female breast: Secondary | ICD-10-CM | POA: Insufficient documentation

## 2016-01-26 DIAGNOSIS — Z510 Encounter for antineoplastic radiation therapy: Secondary | ICD-10-CM | POA: Diagnosis not present

## 2016-01-26 DIAGNOSIS — Z7982 Long term (current) use of aspirin: Secondary | ICD-10-CM | POA: Insufficient documentation

## 2016-01-26 DIAGNOSIS — Z79899 Other long term (current) drug therapy: Secondary | ICD-10-CM | POA: Insufficient documentation

## 2016-01-26 DIAGNOSIS — Z923 Personal history of irradiation: Secondary | ICD-10-CM | POA: Insufficient documentation

## 2016-01-26 NOTE — Progress Notes (Signed)
Radiation Oncology         [phone] ________________________________  Name: Alexis [name] MRN: [id number]  Date: 01/26/2016  DOB: 1950/07/24  [id number] [name], MD  Magrinat, Virgie Dad, MD     REFERRING PHYSICIAN: Magrinat, Virgie Dad, MD   DIAGNOSIS: The encounter diagnosis was Breast cancer of upper-outer quadrant of right female breast ([place]). pT1c pN0, stage IA  Invasive ductal carcinoma, grade 1, ER positive, repeat HER-2 again negative   HISTORY OF PRESENT ILLNESS: Alexis [name] is a 65 y.o. female who is seen for an initial consultation visit regarding the patient's diagnosis of breast cancer.  The patient was found to have suspicious findings within the right breast on initial mammogram. The patient has not had symptoms prior to this study. A diagnostic mammogram and breast ultrasound confirmed this finding. On ultrasound, the tumor measured 1.2 cm and no suspicious findings were seen within the axilla on the right. A biopsy was performed which revealed invasive ductal carcinoma corresponding to a grade 1 tumor. Receptors studies were completed and indicate that the tumor is estrogen receptor positive, progesterone receptor negative, and Her-2/neu negative. The Ki-67 staining was 5 %.  Lumpectomy on 12/22/15 revealed invasive and in situ ductal carcinoma tumor less than 0.1 cm from the anterior margin. Her axillary lymph node that was sampled and was also negative. Her Oncotype score was 21 and she was offered the opportunity to consider this but was not felt to be at high risk to require this. She comes in preparation to discuss the role of radiotherapy. She will begin antiestrogen therapy with Dr. Jana Hakim following radiation.    PREVIOUS RADIATION THERAPY: No   PAST MEDICAL HISTORY:  Past Medical History:  Diagnosis Date  . Breast cancer ([place])   . Chronic fatigue   . Cough   . GERD (gastroesophageal reflux disease)   . Hemangioma   . Hyperlipidemia   . IBS (irritable bowel  syndrome)   . Iron deficiency anemia   . Kyphosis of thoracic region   . MVP (mitral valve prolapse)   . Osteopenia   . Vertigo       PAST SURGICAL HISTORY: Past Surgical History:  Procedure Laterality Date  . APPENDECTOMY    . BREAST LUMPECTOMY WITH RADIOACTIVE SEED AND SENTINEL LYMPH NODE BIOPSY Right 12/22/2015   Procedure: BREAST LUMPECTOMY WITH RADIOACTIVE SEED AND SENTINEL LYMPH NODE BIOPSY;  Surgeon: Alphonsa Overall, MD;  Location: [facility];  Service: General;  Laterality: Right;  . FOOT SURGERY Right    bunionectomy  . [facility]  2003   s/p embolization per Dr. Estanislado Pandy  . TONSILLECTOMY    . TUBAL LIGATION       FAMILY HISTORY:  Family History  Problem Relation Age of Onset  . Hyperlipidemia Mother   . Breast cancer Mother   . Heart attack Father   . Diabetes Brother   . Breast cancer Maternal Aunt   . Breast cancer Paternal Aunt      SOCIAL HISTORY:  reports that she has never smoked. She has never used smokeless tobacco. She reports that she does not drink alcohol or use drugs. The patient is married. 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    ALLERGIES: Latex   MEDICATIONS:  Current Outpatient Prescriptions  Medication Sig Dispense Refill  . aspirin 81 MG tablet Take 81 mg by mouth daily.     Marland Kitchen b complex vitamins tablet Take 1  tablet by mouth daily.    . calcium citrate-vitamin D (CITRACAL+D) 315-200 MG-UNIT tablet Take 1 tablet by mouth daily.    . famotidine ([employer] ACID CONTROLLER MAX ST) 20 MG tablet Take 20 mg by mouth 2 (two) times daily.    . iron polysaccharides (NIFEREX) 150 MG capsule Take 1 capsule (150 mg total) by mouth daily. 30 capsule 2  . Naproxen (NAPROXEN) 375 MG TBEC Take by mouth.    . pantoprazole (PROTONIX) 40 MG tablet TAKE 1 BY MOUTH DAILY 90 tablet 3  . pravastatin (PRAVACHOL) 40 MG tablet TAKE 1 BY MOUTH EVERY EVENING 90 tablet 3   No current facility-administered  medications for this encounter.      REVIEW OF SYSTEMS:  On review of systems, the patient reports that she is doing well overall. She denies any chest pain, shortness of breath, cough, fevers, chills, night sweats, unintended weight changes. She denies any bowel or bladder disturbances, and denies abdominal pain, nausea or vomiting. She denies any new musculoskeletal or joint aches or pains. A complete review of systems is obtained and is otherwise negative.     PHYSICAL EXAM:  height is '5\' 5"'$  (1.651 m) and weight is 119 lb 11.2 oz (54.3 kg). Her temperature is 97.8 F (36.6 C). Her blood pressure is 147/53 (abnormal) and her pulse is 57 (abnormal).   In general this is a well appearing caucasian female in no acute distress. She's alert and oriented x4 and appropriate throughout the examination. Cardiopulmonary assessment is negative for acute distress and she exhibits normal effort. The right breast lumpectomy scar is intact without cellulitic change, her axillary incision is also healing well. No separation or bleeding is noted. No edema of the chest wall or upper extremity is noted on the right.  ECOG = 1  0 - Asymptomatic (Fully active, able to carry on all predisease activities without restriction)  1 - Symptomatic but completely ambulatory (Restricted in physically strenuous activity but ambulatory and able to carry out work of a light or sedentary nature. For example, light housework, office work)  2 - Symptomatic, <50% in bed during the day (Ambulatory and capable of all self care but unable to carry out any work activities. Up and about more than 50% of waking hours)  3 - Symptomatic, >50% in bed, but not bedbound (Capable of only limited self-care, confined to bed or chair 50% or more of waking hours)  4 - Bedbound (Completely disabled. Cannot carry on any self-care. Totally confined to bed or chair)  5 - Death   Eustace Pen MM, Creech RH, Tormey DC, et al. [phone]). "Toxicity and  response criteria of the [facility]". [place] Oncol. 5 (6): 649-55    LABORATORY DATA:  Lab Results  Component Value Date   WBC 4.4 11/23/2015   HGB 11.6 11/23/2015   HCT 36.1 11/23/2015   MCV 82.6 11/23/2015   PLT 183 11/23/2015   Lab Results  Component Value Date   NA 143 11/23/2015   K 3.6 11/23/2015   CL 103 04/21/2015   CO2 28 11/23/2015   Lab Results  Component Value Date   ALT 16 11/23/2015   AST 20 11/23/2015   ALKPHOS 82 11/23/2015   BILITOT 0.40 11/23/2015      RADIOGRAPHY: No results found.     IMPRESSION: pT1c pN0, stage IA  Invasive ductal carcinoma, grade 1, ER positive, repeat HER-2 again negative  PLAN: Dr. Lisbeth Renshaw discusses the pathology findings and reviews  the nature of invasive disease. We reviewed that based on her oncotype score of 21, chemotherapy is not felt to be highly indicated, and the patient's course could now be followed by external radiotherapy to the breast followed by antiestrogen therapy. We discussed the risks, benefits, short, and long term effects of radiotherapy, and the patient is interested in proceeding. Dr. Lisbeth Renshaw discusses the delivery and logistics of radiotherapy. He recommends 4 weeks of treatment with 20 fractions as an outpt. We will coordinate her simulation for tomorrow afternoon. Written consent is obtained and she is interested in moving forward.  The above documentation reflects my direct findings during this shared patient visit. Please see the separate note by Dr. Lisbeth Renshaw on this date for the remainder of the patient's plan of care.     Carola Rhine, PAC

## 2016-01-26 NOTE — Telephone Encounter (Signed)
Called and lm with family member to have pt call us back.

## 2016-01-27 ENCOUNTER — Ambulatory Visit
Admission: RE | Admit: 2016-01-27 | Discharge: 2016-01-27 | Disposition: A | Discharge status: Home / Self Care | Payer: BLUE CROSS/BLUE SHIELD | Source: Ambulatory Visit | Attending: Radiation Oncology | Admitting: Radiation Oncology

## 2016-01-27 DIAGNOSIS — Z510 Encounter for antineoplastic radiation therapy: Secondary | ICD-10-CM | POA: Diagnosis not present

## 2016-01-27 DIAGNOSIS — C50411 Malignant neoplasm of upper-outer quadrant of right female breast: Secondary | ICD-10-CM

## 2016-01-27 NOTE — Telephone Encounter (Signed)
lmtcb x2 for pt. 

## 2016-01-30 NOTE — Telephone Encounter (Signed)
lmtcb x3 for pt. Message will be closed per triage protocol.

## 2016-02-01 DIAGNOSIS — Z510 Encounter for antineoplastic radiation therapy: Secondary | ICD-10-CM | POA: Diagnosis not present

## 2016-02-03 ENCOUNTER — Ambulatory Visit: Payer: BLUE CROSS/BLUE SHIELD | Admitting: Internal Medicine

## 2016-02-03 ENCOUNTER — Ambulatory Visit: Payer: BLUE CROSS/BLUE SHIELD | Admitting: Radiation Oncology

## 2016-02-06 ENCOUNTER — Ambulatory Visit
Admission: RE | Admit: 2016-02-06 | Discharge: 2016-02-06 | Disposition: A | Discharge status: Home / Self Care | Payer: BLUE CROSS/BLUE SHIELD | Source: Ambulatory Visit | Attending: Radiation Oncology | Admitting: Radiation Oncology

## 2016-02-06 DIAGNOSIS — Z510 Encounter for antineoplastic radiation therapy: Secondary | ICD-10-CM | POA: Diagnosis not present

## 2016-02-07 ENCOUNTER — Ambulatory Visit
Admission: RE | Admit: 2016-02-07 | Discharge: 2016-02-07 | Disposition: A | Discharge status: Home / Self Care | Payer: BLUE CROSS/BLUE SHIELD | Source: Ambulatory Visit | Attending: Radiation Oncology | Admitting: Radiation Oncology

## 2016-02-07 DIAGNOSIS — Z510 Encounter for antineoplastic radiation therapy: Secondary | ICD-10-CM | POA: Diagnosis not present

## 2016-02-08 ENCOUNTER — Ambulatory Visit
Admission: RE | Admit: 2016-02-08 | Discharge: 2016-02-08 | Disposition: A | Discharge status: Home / Self Care | Payer: BLUE CROSS/BLUE SHIELD | Source: Ambulatory Visit | Attending: Radiation Oncology | Admitting: Radiation Oncology

## 2016-02-08 DIAGNOSIS — Z510 Encounter for antineoplastic radiation therapy: Secondary | ICD-10-CM | POA: Diagnosis not present

## 2016-02-09 ENCOUNTER — Ambulatory Visit
Admission: RE | Admit: 2016-02-09 | Discharge: 2016-02-09 | Disposition: A | Discharge status: Home / Self Care | Payer: BLUE CROSS/BLUE SHIELD | Source: Ambulatory Visit | Attending: Radiation Oncology | Admitting: Radiation Oncology

## 2016-02-09 DIAGNOSIS — Z510 Encounter for antineoplastic radiation therapy: Secondary | ICD-10-CM | POA: Diagnosis not present

## 2016-02-10 ENCOUNTER — Encounter: Payer: Self-pay | Admitting: Radiation Oncology

## 2016-02-10 ENCOUNTER — Ambulatory Visit
Admission: RE | Admit: 2016-02-10 | Discharge: 2016-02-10 | Disposition: A | Discharge status: Home / Self Care | Payer: BLUE CROSS/BLUE SHIELD | Source: Ambulatory Visit | Attending: Radiation Oncology | Admitting: Radiation Oncology

## 2016-02-10 ENCOUNTER — Ambulatory Visit: Payer: BLUE CROSS/BLUE SHIELD | Admitting: Radiation Oncology

## 2016-02-10 VITALS — BP 136/53 | HR 54 | Temp 97.9°F | Ht 65.0 in | Wt 119.0 lb

## 2016-02-10 DIAGNOSIS — Z510 Encounter for antineoplastic radiation therapy: Secondary | ICD-10-CM | POA: Diagnosis not present

## 2016-02-10 DIAGNOSIS — C50411 Malignant neoplasm of upper-outer quadrant of right female breast: Secondary | ICD-10-CM

## 2016-02-10 MED ORDER — PROCHLORPERAZINE MALEATE 10 MG PO TABS
ORAL_TABLET | ORAL | Status: AC
  Filled 2016-02-10: qty 1

## 2016-02-10 MED ORDER — RADIAPLEXRX EX GEL
Freq: Once | CUTANEOUS | Status: AC
  Administered 2016-02-10: 13:00:00 via TOPICAL

## 2016-02-10 MED ORDER — ALRA NON-METALLIC DEODORANT (RAD-ONC)
1.0000 "application " | Freq: Once | TOPICAL | Status: AC
  Administered 2016-02-10: 1 via TOPICAL

## 2016-02-10 NOTE — Progress Notes (Signed)
Alexis [name] has received 5 fractions to her right breast  Today.  No voiced concerns today.

## 2016-02-10 NOTE — Progress Notes (Signed)
   Department of Radiation Oncology  Phone:  [phone] Fax:        [phone]  Weekly Treatment Note    Name: Alexis [name] Date: 02/12/2016 MRN: [id number] DOB: 1951/03/17   Diagnosis:     ICD-9-CM ICD-10-CM   1. Breast cancer of upper-outer quadrant of right female breast ([facility]) 174.4 C50.411 hyaluronate sodium (RADIAPLEXRX) gel     non-metallic deodorant (ALRA) 1 application     Current dose: 12.5 Gy  Current fraction:5   MEDICATIONS: Current Outpatient Prescriptions  Medication Sig Dispense Refill  . aspirin 81 MG tablet Take 81 mg by mouth daily.     Marland Kitchen b complex vitamins tablet Take 1 tablet by mouth daily.    . calcium citrate-vitamin D (CITRACAL+D) 315-200 MG-UNIT tablet Take 1 tablet by mouth daily.    . famotidine ([employer] ACID CONTROLLER MAX ST) 20 MG tablet Take 20 mg by mouth 2 (two) times daily.    . hyaluronate sodium (RADIAPLEXRX) GEL Apply 1 application topically 2 (two) times daily.    . iron polysaccharides (NIFEREX) 150 MG capsule Take 1 capsule (150 mg total) by mouth daily. 30 capsule 2  . Naproxen (NAPROXEN) 375 MG TBEC Take by mouth.    . non-metallic deodorant Jethro Poling) MISC Apply 1 application topically daily as needed.    . pantoprazole (PROTONIX) 40 MG tablet TAKE 1 BY MOUTH DAILY 90 tablet 3  . pravastatin (PRAVACHOL) 40 MG tablet TAKE 1 BY MOUTH EVERY EVENING 90 tablet 3   No current facility-administered medications for this encounter.      ALLERGIES: Latex   LABORATORY DATA:  Lab Results  Component Value Date   WBC 4.4 11/23/2015   HGB 11.6 11/23/2015   HCT 36.1 11/23/2015   MCV 82.6 11/23/2015   PLT 183 11/23/2015   Lab Results  Component Value Date   NA 143 11/23/2015   K 3.6 11/23/2015   CL 103 04/21/2015   CO2 28 11/23/2015   Lab Results  Component Value Date   ALT 16 11/23/2015   AST 20 11/23/2015   ALKPHOS 82 11/23/2015   BILITOT 0.40 11/23/2015     NARRATIVE: Alexis [name] was seen today for weekly treatment  management. The chart was checked and the patient's films were reviewed.  The patient has received 5 fractions to her right breast. She has no concerns at this time.  PHYSICAL EXAMINATION: height is 5\' 5"  (1.651 m) and weight is 119 lb (54 kg). Her temperature is 97.9 F (36.6 C). Her blood pressure is 136/53 (abnormal) and her pulse is 54 (abnormal).   ASSESSMENT: The patient is doing satisfactorily with treatment.  PLAN: We will continue with the patient's radiation treatment as planned.  This document serves as a record of services personally performed by Kyung Rudd, MD. It was created on his behalf by Darcus Austin, a trained medical scribe. The creation of this record is based on the scribe's personal observations and the provider's statements to them. This document has been checked and approved by the attending provider.

## 2016-02-13 ENCOUNTER — Ambulatory Visit
Admission: RE | Admit: 2016-02-13 | Discharge: 2016-02-13 | Disposition: A | Discharge status: Home / Self Care | Payer: BLUE CROSS/BLUE SHIELD | Source: Ambulatory Visit | Attending: Radiation Oncology | Admitting: Radiation Oncology

## 2016-02-13 DIAGNOSIS — Z510 Encounter for antineoplastic radiation therapy: Secondary | ICD-10-CM | POA: Diagnosis not present

## 2016-02-14 ENCOUNTER — Telehealth: Payer: Self-pay | Admitting: *Deleted

## 2016-02-14 ENCOUNTER — Ambulatory Visit
Admission: RE | Admit: 2016-02-14 | Discharge: 2016-02-14 | Disposition: A | Discharge status: Home / Self Care | Payer: BLUE CROSS/BLUE SHIELD | Source: Ambulatory Visit | Attending: Radiation Oncology | Admitting: Radiation Oncology

## 2016-02-14 DIAGNOSIS — Z510 Encounter for antineoplastic radiation therapy: Secondary | ICD-10-CM | POA: Diagnosis not present

## 2016-02-14 NOTE — Telephone Encounter (Signed)
  Oncology Nurse Navigator Documentation  Navigator Location: [facility] (02/14/16 1500) Navigator Encounter Type: Telephone (02/14/16 1500) Telephone: [place] Call (02/14/16 1500)     Surgery Date: 12/22/15 (02/14/16 1500)   Patient Visit Type: RadOnc (02/14/16 1500) Treatment Phase: First Radiation Tx (02/14/16 1500)                            Time Spent with Patient: 15 (02/14/16 1500)

## 2016-02-15 ENCOUNTER — Encounter: Payer: Self-pay | Admitting: Radiation Oncology

## 2016-02-15 ENCOUNTER — Ambulatory Visit
Admission: RE | Admit: 2016-02-15 | Discharge: 2016-02-15 | Disposition: A | Discharge status: Home / Self Care | Payer: BLUE CROSS/BLUE SHIELD | Source: Ambulatory Visit | Attending: Radiation Oncology | Admitting: Radiation Oncology

## 2016-02-15 VITALS — BP 131/65 | HR 60 | Temp 98.6°F | Resp 16 | Wt 119.0 lb

## 2016-02-15 DIAGNOSIS — C50411 Malignant neoplasm of upper-outer quadrant of right female breast: Secondary | ICD-10-CM

## 2016-02-15 DIAGNOSIS — Z510 Encounter for antineoplastic radiation therapy: Secondary | ICD-10-CM | POA: Diagnosis not present

## 2016-02-15 NOTE — Progress Notes (Signed)
  Radiation Oncology         [phone]) [phone] ________________________________  Name: Alexis [name] MRN: [id number]  Date: 01/27/2016  DOB: 07-17-1950  Optical Surface Tracking Plan:  Since intensity modulated radiotherapy (IMRT) and 3D conformal radiation treatment methods are predicated on accurate and precise positioning for treatment, intrafraction motion monitoring is medically necessary to ensure accurate and safe treatment delivery.  The ability to quantify intrafraction motion without excessive ionizing radiation dose can only be performed with optical surface tracking. Accordingly, surface imaging offers the opportunity to obtain 3D measurements of patient position throughout IMRT and 3D treatments without excessive radiation exposure.  I am ordering optical surface tracking for this patient's upcoming course of radiotherapy. ________________________________  Kyung Rudd, MD 02/15/2016 8:40 PM    Reference:   Particia Jasper, et al. Surface imaging-based analysis of intrafraction motion for breast radiotherapy patients.Journal of [place], n. 6, nov. 2014. ISSN [id number].   Available at: <http://www.jacmp.org/index.php/jacmp/article/view/4957>.

## 2016-02-15 NOTE — Progress Notes (Signed)
Weekly rad txs right breast 8/17 mild erythema skin intact,  Using radiaplex bid, no c/o pain, good appetite,  4:14 PM BP 131/65 (BP Location: Left Arm, Patient Position: Sitting, Cuff Size: Normal)   Pulse 60   Temp 98.6 F (37 C) (Oral)   Resp 16   Wt 119 lb (54 kg)   BMI 19.80 kg/m   Wt Readings from Last 3 Encounters:  02/15/16 119 lb (54 kg)  02/10/16 119 lb (54 kg)  01/26/16 119 lb 11.2 oz (54.3 kg)

## 2016-02-15 NOTE — Progress Notes (Signed)
   Department of Radiation Oncology  Phone:  [phone] Fax:        [phone]  Weekly Treatment Note    Name: Alexis [name] Date: 02/15/2016 MRN: [id number] DOB: 29-Jun-1950   Diagnosis:     ICD-9-CM ICD-10-CM   1. Breast cancer of upper-outer quadrant of right female breast ([place]) 174.4 C50.411      Current dose: 20 Gy  Current fraction:8   MEDICATIONS: Current Outpatient Prescriptions  Medication Sig Dispense Refill  . aspirin 81 MG tablet Take 81 mg by mouth daily.     Marland Kitchen b complex vitamins tablet Take 1 tablet by mouth daily.    . calcium citrate-vitamin D (CITRACAL+D) 315-200 MG-UNIT tablet Take 1 tablet by mouth daily.    . famotidine ([employer] ACID CONTROLLER MAX ST) 20 MG tablet Take 20 mg by mouth 2 (two) times daily.    . hyaluronate sodium (RADIAPLEXRX) GEL Apply 1 application topically 2 (two) times daily.    . iron polysaccharides (NIFEREX) 150 MG capsule Take 1 capsule (150 mg total) by mouth daily. 30 capsule 2  . Naproxen (NAPROXEN) 375 MG TBEC Take by mouth.    . non-metallic deodorant Jethro Poling) MISC Apply 1 application topically daily as needed.    . pantoprazole (PROTONIX) 40 MG tablet TAKE 1 BY MOUTH DAILY 90 tablet 3  . pravastatin (PRAVACHOL) 40 MG tablet TAKE 1 BY MOUTH EVERY EVENING 90 tablet 3   No current facility-administered medications for this encounter.      ALLERGIES: Latex   LABORATORY DATA:  Lab Results  Component Value Date   WBC 4.4 11/23/2015   HGB 11.6 11/23/2015   HCT 36.1 11/23/2015   MCV 82.6 11/23/2015   PLT 183 11/23/2015   Lab Results  Component Value Date   NA 143 11/23/2015   K 3.6 11/23/2015   CL 103 04/21/2015   CO2 28 11/23/2015   Lab Results  Component Value Date   ALT 16 11/23/2015   AST 20 11/23/2015   ALKPHOS 82 11/23/2015   BILITOT 0.40 11/23/2015     NARRATIVE: Alexis [name] was seen today for weekly treatment management. The chart was checked and the patient's films were reviewed. Mild erythema is  noted. Patient complains of a slight burning sensation, but it produces very little pain. She notes good appetite. The skin is intact.      PHYSICAL EXAMINATION: weight is 119 lb (54 kg). Her oral temperature is 98.6 F (37 C). Her blood pressure is 131/65 and her pulse is 60. Her respiration is 16.       ASSESSMENT: The patient is doing satisfactorily with treatment.  PLAN: We will continue with the patient's radiation treatment as planned.   This document serves as a record of services personally performed by Kyung Rudd, MD. It was created on his behalf by Bethann Humble, a trained medical scribe. The creation of this record is based on the scribe's personal observations and the provider's statements to them. This document has been checked and approved by the attending provider.

## 2016-02-15 NOTE — Progress Notes (Signed)
  Radiation Oncology         [phone] ________________________________  Name: Alexis [name] MRN: [id number]  Date: 01/27/2016  DOB: 06-Dec-1950   DIAGNOSIS:     ICD-9-CM ICD-10-CM   1. Breast cancer of upper-outer quadrant of right female breast ([place]) 174.4 C50.411     SIMULATION AND TREATMENT PLANNING NOTE  The patient presented for simulation prior to beginning her course of radiation treatment for her diagnosis of right-sided breast cancer. The patient was placed in a supine position on a breast board. A customized vac-lock bag was constructed and this complex treatment device will be used on a daily basis during her treatment. In this fashion, a CT scan was obtained through the chest area and an isocenter was placed near the chest wall within the breast.  The patient will be planned to receive a course of radiation initially to a dose of 42.5 Gy. This will consist of a whole breast radiotherapy technique. To accomplish this, 2 customized blocks have been designed which will correspond to medial and lateral whole breast tangent fields. This treatment will be accomplished at 2.5 Gy per fraction. A forward planning technique will also be evaluated to determine if this approach improves the plan. It is anticipated that the patient will then receive a 7.5 Gy boost to the seroma cavity which has been contoured. This will be accomplished at 2.5 Gy per fraction.   This initial treatment will consist of a 3-D conformal technique. The seroma has been contoured as the primary target structure. Additionally, dose volume histograms of both this target as well as the lungs and heart will also be evaluated. Such an approach is necessary to ensure that the target area is adequately covered while the nearby critical  normal structures are adequately spared.  Plan:  The final anticipated total dose therefore will correspond to 50 Gy.    _______________________________   Jodelle Gross, MD, PhD

## 2016-02-16 ENCOUNTER — Ambulatory Visit
Admission: RE | Admit: 2016-02-16 | Discharge: 2016-02-16 | Disposition: A | Discharge status: Home / Self Care | Payer: BLUE CROSS/BLUE SHIELD | Source: Ambulatory Visit | Attending: Radiation Oncology | Admitting: Radiation Oncology

## 2016-02-16 DIAGNOSIS — Z510 Encounter for antineoplastic radiation therapy: Secondary | ICD-10-CM | POA: Diagnosis not present

## 2016-02-17 ENCOUNTER — Ambulatory Visit
Admission: RE | Admit: 2016-02-17 | Discharge: 2016-02-17 | Disposition: A | Discharge status: Home / Self Care | Payer: BLUE CROSS/BLUE SHIELD | Source: Ambulatory Visit | Attending: Radiation Oncology | Admitting: Radiation Oncology

## 2016-02-17 DIAGNOSIS — Z510 Encounter for antineoplastic radiation therapy: Secondary | ICD-10-CM | POA: Diagnosis not present

## 2016-02-17 MED ORDER — PROCHLORPERAZINE MALEATE 10 MG PO TABS
ORAL_TABLET | ORAL | Status: AC
  Filled 2016-02-17: qty 1

## 2016-02-21 ENCOUNTER — Other Ambulatory Visit: Payer: Self-pay | Admitting: Nurse Practitioner

## 2016-02-21 ENCOUNTER — Ambulatory Visit
Admission: RE | Admit: 2016-02-21 | Discharge: 2016-02-21 | Disposition: A | Discharge status: Home / Self Care | Payer: BLUE CROSS/BLUE SHIELD | Source: Ambulatory Visit | Attending: Radiation Oncology | Admitting: Radiation Oncology

## 2016-02-21 DIAGNOSIS — Z510 Encounter for antineoplastic radiation therapy: Secondary | ICD-10-CM | POA: Diagnosis not present

## 2016-02-21 DIAGNOSIS — C50411 Malignant neoplasm of upper-outer quadrant of right female breast: Secondary | ICD-10-CM

## 2016-02-22 ENCOUNTER — Encounter: Payer: Self-pay | Admitting: Radiation Oncology

## 2016-02-22 ENCOUNTER — Ambulatory Visit
Admission: RE | Admit: 2016-02-22 | Discharge: 2016-02-22 | Disposition: A | Discharge status: Home / Self Care | Payer: BLUE CROSS/BLUE SHIELD | Source: Ambulatory Visit | Attending: Radiation Oncology | Admitting: Radiation Oncology

## 2016-02-22 ENCOUNTER — Ambulatory Visit (HOSPITAL_BASED_OUTPATIENT_CLINIC_OR_DEPARTMENT_OTHER): Payer: BLUE CROSS/BLUE SHIELD | Admitting: Oncology

## 2016-02-22 ENCOUNTER — Other Ambulatory Visit (HOSPITAL_BASED_OUTPATIENT_CLINIC_OR_DEPARTMENT_OTHER): Payer: BLUE CROSS/BLUE SHIELD

## 2016-02-22 ENCOUNTER — Ambulatory Visit: Payer: BLUE CROSS/BLUE SHIELD | Admitting: Radiation Oncology

## 2016-02-22 VITALS — BP 168/53 | HR 63 | Temp 98.0°F | Resp 18 | Ht 65.0 in | Wt 119.0 lb

## 2016-02-22 VITALS — BP 168/53 | HR 59 | Temp 98.0°F | Resp 18 | Wt 119.9 lb

## 2016-02-22 DIAGNOSIS — C50411 Malignant neoplasm of upper-outer quadrant of right female breast: Secondary | ICD-10-CM

## 2016-02-22 DIAGNOSIS — Z170 Estrogen receptor positive status [ER+]: Secondary | ICD-10-CM | POA: Diagnosis not present

## 2016-02-22 DIAGNOSIS — Z510 Encounter for antineoplastic radiation therapy: Secondary | ICD-10-CM | POA: Diagnosis not present

## 2016-02-22 LAB — COMPREHENSIVE METABOLIC PANEL
ALBUMIN: 3.7 g/dL (ref 3.5–5.0)
ALK PHOS: 82 U/L (ref 40–150)
ALT: 15 U/L (ref 0–55)
ANION GAP: 9 meq/L (ref 3–11)
AST: 20 U/L (ref 5–34)
BUN: 13.5 mg/dL (ref 7.0–26.0)
CALCIUM: 9.3 mg/dL (ref 8.4–10.4)
CHLORIDE: 105 meq/L (ref 98–109)
CO2: 29 mEq/L (ref 22–29)
CREATININE: 0.7 mg/dL (ref 0.6–1.1)
EGFR: 89 mL/min/{1.73_m2} — ABNORMAL LOW (ref >90)
Glucose: 87 mg/dl (ref 70–140)
POTASSIUM: 3.8 meq/L (ref 3.5–5.1)
Sodium: 143 mEq/L (ref 136–145)
Total Bilirubin: 0.87 mg/dL (ref 0.20–1.20)
Total Protein: 7.2 g/dL (ref 6.4–8.3)

## 2016-02-22 LAB — CBC WITH DIFFERENTIAL/PLATELET
BASO%: 0.6 % (ref 0.0–2.0)
BASOS ABS: 0 10*3/uL (ref 0.0–0.1)
EOS%: 0.7 % (ref 0.0–7.0)
Eosinophils Absolute: 0 10*3/uL (ref 0.0–0.5)
HEMATOCRIT: 35.9 % (ref 34.8–46.6)
HEMOGLOBIN: 11.7 g/dL (ref 11.6–15.9)
LYMPH#: 1.1 10*3/uL (ref 0.9–3.3)
LYMPH%: 23.3 % (ref 14.0–49.7)
MCH: 27 pg (ref 25.1–34.0)
MCHC: 32.7 g/dL (ref 31.5–36.0)
MCV: 82.5 fL (ref 79.5–101.0)
MONO#: 0.6 10*3/uL (ref 0.1–0.9)
MONO%: 11.9 % (ref 0.0–14.0)
NEUT#: 3.1 10*3/uL (ref 1.5–6.5)
NEUT%: 63.5 % (ref 38.4–76.8)
PLATELETS: 169 10*3/uL (ref 145–400)
RBC: 4.35 10*6/uL (ref 3.70–5.45)
RDW: 15.9 % — ABNORMAL HIGH (ref 11.2–14.5)
WBC: 4.9 10*3/uL (ref 3.9–10.3)

## 2016-02-22 NOTE — Progress Notes (Signed)
weekly rad tx right breast, mild erythema , nipple inverted s/p surgery, using radiaplex bid, no pain,  Appetite good,  No fatiogue stated 10:32 AM BP (!) 168/53 (BP Location: Left Arm, Patient Position: Sitting, Cuff Size: Normal)   Pulse (!) 59   Temp 98 F (36.7 C) (Oral)   Resp 18   Wt 119 lb 14.4 oz (54.4 kg)   BMI 19.95 kg/m   Wt Readings from Last 3 Encounters:  02/22/16 119 lb 14.4 oz (54.4 kg)  02/15/16 119 lb (54 kg)  02/10/16 119 lb (54 kg)

## 2016-02-22 NOTE — Progress Notes (Signed)
   Department of Radiation Oncology  Phone:  [phone] Fax:        [phone]  Weekly Treatment Note    Name: Alexis [name] Date: 02/22/2016 MRN: [id number] DOB: 1951/05/03   Diagnosis:     ICD-9-CM ICD-10-CM   1. Breast cancer of upper-outer quadrant of right female breast ([place]) 174.4 C50.411      Current dose: 30 Gy  Current fraction: 12   MEDICATIONS: Current Outpatient Prescriptions  Medication Sig Dispense Refill  . aspirin 81 MG tablet Take 81 mg by mouth daily.     Marland Kitchen b complex vitamins tablet Take 1 tablet by mouth daily.    . calcium citrate-vitamin D (CITRACAL+D) 315-200 MG-UNIT tablet Take 1 tablet by mouth daily.    . famotidine ([employer]S ACID CONTROLLER MAX ST) 20 MG tablet Take 20 mg by mouth 2 (two) times daily.    . hyaluronate sodium (RADIAPLEXRX) GEL Apply 1 application topically 2 (two) times daily.    . iron polysaccharides (NIFEREX) 150 MG capsule Take 1 capsule (150 mg total) by mouth daily. 30 capsule 2  . Naproxen (NAPROXEN) 375 MG TBEC Take by mouth.    . non-metallic deodorant Jethro Poling) MISC Apply 1 application topically daily as needed.    . pantoprazole (PROTONIX) 40 MG tablet TAKE 1 BY MOUTH DAILY 90 tablet 3  . pravastatin (PRAVACHOL) 40 MG tablet TAKE 1 BY MOUTH EVERY EVENING 90 tablet 3   No current facility-administered medications for this encounter.      ALLERGIES: Latex   LABORATORY DATA:  Lab Results  Component Value Date   WBC 4.4 11/23/2015   HGB 11.6 11/23/2015   HCT 36.1 11/23/2015   MCV 82.6 11/23/2015   PLT 183 11/23/2015   Lab Results  Component Value Date   NA 143 11/23/2015   K 3.6 11/23/2015   CL 103 04/21/2015   CO2 28 11/23/2015   Lab Results  Component Value Date   ALT 16 11/23/2015   AST 20 11/23/2015   ALKPHOS 82 11/23/2015   BILITOT 0.40 11/23/2015     NARRATIVE: [name] was seen today for weekly treatment management. The chart was checked and the patient's films were reviewed.  weekly rad tx  right breast, mild erythema , nipple inverted s/p surgery, using radiaplex bid, no pain,  Appetite good,  No fatiogue stated 10:46 AM BP (!) 168/53 (BP Location: Left Arm, Patient Position: Sitting, Cuff Size: Normal)   Pulse (!) 59   Temp 98 F (36.7 C) (Oral)   Resp 18   Wt 119 lb 14.4 oz (54.4 kg)   BMI 19.95 kg/m   Wt Readings from Last 3 Encounters:  02/22/16 119 lb 14.4 oz (54.4 kg)  02/15/16 119 lb (54 kg)  02/10/16 119 lb (54 kg)    PHYSICAL EXAMINATION: weight is 119 lb 14.4 oz (54.4 kg). Her oral temperature is 98 F (36.7 C). Her blood pressure is 168/53 (abnormal) and her pulse is 59 (abnormal). Her respiration is 18.      The patient's skin shows mild hyperpigmentation in the treatment area. Overall looks excellent.  ASSESSMENT: The patient is doing satisfactorily with treatment.  PLAN: We will continue with the patient's radiation treatment as planned.

## 2016-02-22 NOTE — Progress Notes (Signed)
[place]  [phone] [phone]     ID: NYKOLE MATOS DOB: [birth year]  MR#: [id number]  UMP#:[id number]  Patient Care Team: Tanda Rockers, MD as PCP - General (Pulmonary Disease) Alphonsa Overall, MD as Consulting Physician (General Surgery) Chauncey Cruel, MD as Consulting Physician (Oncology) Kyung Rudd, MD as Consulting Physician (Radiation Oncology) Leeroy Cha, MD as Consulting Physician (Neurosurgery) Allyn Kenner, MD (Dermatology) Wallene Huh, DPM as Consulting Physician (Podiatry) PCP: Christinia Gully, MD OTHER MD:  CHIEF COMPLAINT: estrogen receptor positive breast cancer  CURRENT TREATMENT:  Adjuvant radiation    BREAST CANCER HISTORY: From the original intake note:  Tylene had routine mammographic screening with tomography at the [facility] 11/04/2015. This showed an area of distortion and calcifications in the right breast.  Right diagnostic mammography with tomography and right breast ultrasonography at the [facility] 11/15/2015 on the breast density to be categoryC.  In the upper-outer quadrant of the right breast there was an area of distortion associated with pleomorphic calcifications measuring approximately 1 cm. On exam there was no palpable mass.  By ultrasonography, there was an irregular hypoechoic mass superiorly measuring 1.2 cm. Ultrasound of the axilla was benign.  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  Her subsequent history is as detailed below  INTERVAL HISTORY: Abbiegail returns today for follow-up of her estrogen receptor positive breast cancer. She is in the middle of her radiation treatments and so far she is  tolerating them well. She's had no skin breakdown and she tells me her energy is good. Although she sits at work she is active and tries to exercise. She is here today to discuss anti-estrogen therapy  REVIEW OF SYSTEMS: A detailed review of systems today was otherwise noncontributory  PAST MEDICAL HISTORY: Past Medical History:  Diagnosis Date  . Breast cancer ([place])   . Chronic fatigue   . Cough   . GERD (gastroesophageal reflux disease)   . Hemangioma   . Hyperlipidemia   . IBS (irritable bowel syndrome)   . Iron deficiency anemia   . Kyphosis of thoracic region   . MVP (mitral valve prolapse)   . Osteopenia   . Vertigo     PAST SURGICAL HISTORY: Past Surgical History:  Procedure Laterality Date  . APPENDECTOMY    . BREAST LUMPECTOMY WITH RADIOACTIVE SEED AND SENTINEL LYMPH NODE BIOPSY Right 12/22/2015   Procedure: BREAST LUMPECTOMY WITH RADIOACTIVE SEED AND SENTINEL LYMPH NODE BIOPSY;  Surgeon: Alphonsa Overall, MD;  Location: [place];  Service: General;  Laterality: Right;  . FOOT SURGERY Right    bunionectomy  . [facility]  2003   s/p embolization per Dr. Estanislado Pandy  . TONSILLECTOMY    . TUBAL LIGATION      FAMILY HISTORY Family History  Problem Relation Age of Onset  . Hyperlipidemia Mother   . Breast cancer Mother   . Heart attack Father   . Diabetes Brother   . Breast cancer Maternal Aunt   . Breast cancer Paternal Aunt   the patient's father died from a ruptured aneurysm at the age of 65. The patient's mother was diagnosed with breast cancer at the age of 81 and died at age 61. The patient has  a paternal aunt diagnosed with breast cancer at age 61. The patient has 2 brothers, 2 sisters. Ne brother has had a history of aneurysms, as has the patient There is no history of ovarian cancer in the family.  GYNECOLOGIC HISTORY:  No LMP recorded. Patient is postmenopausal. Menarche age 8, first live birth age 51, the patient is [place] P2. She stopped having periods  at age 60.He did not take hormone replacement. 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 Daughter [name] lives in [place] where she works as an Medical illustrator. (Adopted) son Loy Little lives in [place] where he is a Museum/gallery curator. 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 The patient has 3 grandchildren aged 72, 48, and 16. She attends a [employer]    ADVANCED DIRECTIVES: not in place   HEALTH MAINTENANCE: Social History  Substance Use Topics  . Smoking status: Never Smoker  . Smokeless tobacco: Never Used  . Alcohol use No     Colonoscopy: 2012/[facility]  PAP:2012  Bone density: 2015  Lipid panel:  Allergies  Allergen Reactions  . Latex Rash    Contact rash    Current Outpatient Prescriptions  Medication Sig Dispense Refill  . aspirin 81 MG tablet Take 81 mg by mouth daily.     Marland Kitchen b complex vitamins tablet Take 1 tablet by mouth daily.    . calcium citrate-vitamin D (CITRACAL+D) 315-200 MG-UNIT tablet Take 1 tablet by mouth daily.    . famotidine ([employer] ACID CONTROLLER MAX ST) 20 MG tablet Take 20 mg by mouth 2 (two) times daily.    . hyaluronate sodium (RADIAPLEXRX) GEL Apply 1 application topically 2 (two) times daily.    . iron polysaccharides (NIFEREX) 150 MG capsule Take 1 capsule (150 mg total) by mouth daily. 30 capsule 2  . Naproxen (NAPROXEN) 375 MG TBEC Take by mouth.    . non-metallic deodorant Jethro Poling) MISC Apply 1 application topically daily as needed.    . pantoprazole (PROTONIX) 40 MG tablet TAKE 1 BY MOUTH DAILY 90 tablet 3  . pravastatin (PRAVACHOL) 40 MG tablet TAKE 1 BY MOUTH EVERY EVENING 90 tablet 3   No current facility-administered medications for this visit.     OBJECTIVE: middle-aged white woman Who appears well Vitals:   02/22/16  1155  BP: (!) 168/53  Pulse: 63  Resp: 18  Temp: 98 F (36.7 C)     Body mass index is 19.8 kg/m.    ECOG FS:0 - Asymptomatic  Sclerae unicteric, EOMs intact Oropharynx clear and moist No cervical or supraclavicular adenopathy Lungs no rales or rhonchi Heart regular rate and rhythm Abd soft, nontender, positive bowel sounds MSK no focal spinal tenderness, no upper extremity lymphedema Neuro: nonfocal, well oriented, appropriate affect Breasts: the right breast is status post lumpectomy and currently receiving radiation. There is minimal erythema. The cosmetic result is good. The right axilla was benign.  LAB RESULTS:  CMP     Component Value Date/Time   NA 143 02/22/2016 1100   K 3.8 02/22/2016 1100   CL 103 04/21/2015 0932   CO2 29 02/22/2016 1100   GLUCOSE 87 02/22/2016 1100   GLUCOSE 106 (H) 05/24/2006 0950   BUN 13.5 02/22/2016 1100   CREATININE 0.7 02/22/2016 1100   CALCIUM 9.3 02/22/2016 1100   PROT  7.2 02/22/2016 1100   ALBUMIN 3.7 02/22/2016 1100   AST 20 02/22/2016 1100   ALT 15 02/22/2016 1100   ALKPHOS 82 02/22/2016 1100   BILITOT 0.87 02/22/2016 1100   GFRNONAA 90.91 06/24/2009 0931   GFRAA 111 06/07/2008 0909    INo results found for: SPEP, UPEP  Lab Results  Component Value Date   WBC 4.9 02/22/2016   NEUTROABS 3.1 02/22/2016   HGB 11.7 02/22/2016   HCT 35.9 02/22/2016   MCV 82.5 02/22/2016   PLT 169 02/22/2016      Chemistry      Component Value Date/Time   NA 143 02/22/2016 1100   K 3.8 02/22/2016 1100   CL 103 04/21/2015 0932   CO2 29 02/22/2016 1100   BUN 13.5 02/22/2016 1100   CREATININE 0.7 02/22/2016 1100      Component Value Date/Time   CALCIUM 9.3 02/22/2016 1100   ALKPHOS 82 02/22/2016 1100   AST 20 02/22/2016 1100   ALT 15 02/22/2016 1100   BILITOT 0.87 02/22/2016 1100       No results found for: LABCA2  No components found for: LABCA125  No results for input(s): INR in the last 168 hours.  Urinalysis      Component Value Date/Time   COLORURINE YELLOW 04/21/2015 0932   APPEARANCEUR CLEAR 04/21/2015 0932   LABSPEC 1.010 04/21/2015 0932   PHURINE 6.0 04/21/2015 0932   GLUCOSEU NEGATIVE 04/21/2015 0932   HGBUR NEGATIVE 04/21/2015 0932   BILIRUBINUR NEGATIVE 04/21/2015 0932   KETONESUR NEGATIVE 04/21/2015 0932   UROBILINOGEN 0.2 04/21/2015 0932   NITRITE NEGATIVE 04/21/2015 0932   LEUKOCYTESUR NEGATIVE 04/21/2015 0932      ELIGIBLE FOR AVAILABLE RESEARCH PROTOCOL: no  STUDIES: No results found.  ASSESSMENT: 65 y.o. [place], [place] woman status post right breast upper outer quadrant biopsy 11/16/2015 for a clinical T1b N0, stage IA invasive ductal carcinoma, grade 1, estrogen receptor positive, progesterone receptor negative, HER-2 nonamplified, with an MIB-1 of 5%.  (1) status post right lumpectomy 12/22/2015 for a pT1c pN0, stage IA  invasive ductal carcinoma, grade 1, repeat HER-2 again negative  (2) Oncotype DX score of 21 predicts a risk of recurrence outside the breast over the next 10 years of 14% if the patient's only systemic therapy is tamoxifen for 5 years. It predicts a benefit from chemotherapy in the 3-4% range.  (3) adjuvant radiation to follow  (4) anti-estrogens to follow at the completion of local treatment  (a) bone density at [facility] 04/16/2014 found osteopenia with a T score of -1.6  (5) the patient does not meet criteria for genetics testing    PLAN: We spent approximately 30 minutes today discussing Verlin's situation. She specifically wanted to know what might happen if she did not take anti-estrogens.  Since her Oncotype predicts a 14% risk of recurrence if her only systemic therapy is tamoxifen for 5 years, and since anti-estrogens roughly cut the risk of recurrence in half, I quoted her a 28% chance of developing breast cancer recurrence in the absence of any systemic treatment.  We then discussed the difference between tamoxifen and anastrozole  in detail. She understands that anastrozole and the aromatase inhibitors in general work by blocking estrogen production. Accordingly vaginal dryness, decrease in bone density, and of course hot flashes can result. The aromatase inhibitors can also negatively affect the cholesterol profile, although that is a minor effect. One out of 5 women on aromatase inhibitors we will feel "old and achy". This arthralgia/myalgia  syndrome, which resembles fibromyalgia clinically, does resolve with stopping the medications. Accordingly this is not a reason to not try an aromatase inhibitor but it is a frequent reason to stop it (in other words 20% of women will not be able to tolerate these medications).  Tamoxifen on the other hand does not block estrogen production. It does not "take away a woman's estrogen". It blocks the estrogen receptor in breast cells. Like anastrozole, it can also cause hot flashes. As opposed to anastrozole, tamoxifen has many estrogen-like effects. It is technically an estrogen receptor modulator. This means that in some tissues tamoxifen works like estrogen-- for example it helps strengthen the bones. It tends to improve the cholesterol profile. It can cause thickening of the endometrial lining, and even endometrial polyps or rarely cancer of the uterus.(The risk of uterine cancer due to tamoxifen is one additional cancer per thousand women year). It can cause vaginal wetness or stickiness. It can cause blood clots through this estrogen-like effect--the risk of blood clots with tamoxifen is exactly the same as with birth control pills or hormone replacement.  Neither of these agents causes mood changes or weight gain, despite the popular belief that they can have these side effects. We have data from studies comparing either of these drugs with placebo, and in those cases the control group had the same amount of weight gain and depression as the group that took the drug.  After all this  discussion Simra is thinking that she will most likely go for tamoxifen. She wants to think about it some more. She is going to call us later this month and let us know her final decision.  Assuming she starts tamoxifen early October, she should see me early December, by which time if she is going to have any significant side effects they should have manifested themselves.  She has a good understanding of this plan. She agrees with it. She knows a goal of treatment in her case is cure. She will call with any problems that may develop before the next visit here.   Chauncey Cruel, MD   02/22/2016 11:58 AM Medical Oncology and Hematology [facility] [address] [place], [place] [address] Tel. [phone]    Fax. [phone] improve

## 2016-02-23 ENCOUNTER — Ambulatory Visit
Admission: RE | Admit: 2016-02-23 | Discharge: 2016-02-23 | Disposition: A | Discharge status: Home / Self Care | Payer: BLUE CROSS/BLUE SHIELD | Source: Ambulatory Visit | Attending: Radiation Oncology | Admitting: Radiation Oncology

## 2016-02-23 DIAGNOSIS — Z510 Encounter for antineoplastic radiation therapy: Secondary | ICD-10-CM | POA: Diagnosis not present

## 2016-02-24 ENCOUNTER — Encounter: Payer: Self-pay | Admitting: Radiation Oncology

## 2016-02-24 ENCOUNTER — Ambulatory Visit
Admission: RE | Admit: 2016-02-24 | Discharge: 2016-02-24 | Disposition: A | Discharge status: Home / Self Care | Payer: BLUE CROSS/BLUE SHIELD | Source: Ambulatory Visit | Attending: Radiation Oncology | Admitting: Radiation Oncology

## 2016-02-24 DIAGNOSIS — Z510 Encounter for antineoplastic radiation therapy: Secondary | ICD-10-CM | POA: Diagnosis not present

## 2016-02-27 ENCOUNTER — Ambulatory Visit
Admission: RE | Admit: 2016-02-27 | Discharge: 2016-02-27 | Disposition: A | Discharge status: Home / Self Care | Payer: BLUE CROSS/BLUE SHIELD | Source: Ambulatory Visit | Attending: Radiation Oncology | Admitting: Radiation Oncology

## 2016-02-27 ENCOUNTER — Ambulatory Visit: Payer: BLUE CROSS/BLUE SHIELD | Admitting: Radiation Oncology

## 2016-02-27 DIAGNOSIS — Z510 Encounter for antineoplastic radiation therapy: Secondary | ICD-10-CM | POA: Diagnosis not present

## 2016-02-28 ENCOUNTER — Ambulatory Visit
Admission: RE | Admit: 2016-02-28 | Discharge: 2016-02-28 | Disposition: A | Discharge status: Home / Self Care | Payer: BLUE CROSS/BLUE SHIELD | Source: Ambulatory Visit | Attending: Radiation Oncology | Admitting: Radiation Oncology

## 2016-02-28 DIAGNOSIS — Z510 Encounter for antineoplastic radiation therapy: Secondary | ICD-10-CM | POA: Diagnosis not present

## 2016-02-29 ENCOUNTER — Ambulatory Visit: Payer: BLUE CROSS/BLUE SHIELD | Admitting: Radiation Oncology

## 2016-02-29 ENCOUNTER — Ambulatory Visit
Admission: RE | Admit: 2016-02-29 | Discharge: 2016-02-29 | Disposition: A | Discharge status: Home / Self Care | Payer: BLUE CROSS/BLUE SHIELD | Source: Ambulatory Visit | Attending: Radiation Oncology | Admitting: Radiation Oncology

## 2016-02-29 DIAGNOSIS — Z510 Encounter for antineoplastic radiation therapy: Secondary | ICD-10-CM | POA: Diagnosis not present

## 2016-03-01 ENCOUNTER — Ambulatory Visit
Admission: RE | Admit: 2016-03-01 | Discharge: 2016-03-01 | Disposition: A | Discharge status: Home / Self Care | Payer: BLUE CROSS/BLUE SHIELD | Source: Ambulatory Visit | Attending: Radiation Oncology | Admitting: Radiation Oncology

## 2016-03-01 DIAGNOSIS — Z510 Encounter for antineoplastic radiation therapy: Secondary | ICD-10-CM | POA: Diagnosis not present

## 2016-03-02 ENCOUNTER — Encounter: Payer: Self-pay | Admitting: Radiation Oncology

## 2016-03-02 ENCOUNTER — Ambulatory Visit
Admission: RE | Admit: 2016-03-02 | Discharge: 2016-03-02 | Disposition: A | Discharge status: Home / Self Care | Payer: BLUE CROSS/BLUE SHIELD | Source: Ambulatory Visit | Attending: Radiation Oncology | Admitting: Radiation Oncology

## 2016-03-02 VITALS — BP 144/72 | HR 66 | Temp 97.9°F | Resp 16 | Ht 65.0 in | Wt 118.5 lb

## 2016-03-02 DIAGNOSIS — Z510 Encounter for antineoplastic radiation therapy: Secondary | ICD-10-CM | POA: Diagnosis not present

## 2016-03-02 DIAGNOSIS — C50411 Malignant neoplasm of upper-outer quadrant of right female breast: Secondary | ICD-10-CM

## 2016-03-02 NOTE — Progress Notes (Addendum)
weekly rad tx right breast 19, mild erythema , nipple inverted s/p surgery, using radiaplex bid, no pain,  Appetite good.  Having mild fatigue.  EOT instructions given.  Will stop by the waiting area to get her one month card to come back to see Shona Simpson, P.A. in a month. Wt Readings from Last 3 Encounters:  03/02/16 118 lb 8 oz (53.8 kg)  02/22/16 119 lb 14.4 oz (54.4 kg)  02/22/16 119 lb (54 kg)  BP (!) 144/72 (BP Location: Left Arm, Patient Position: Sitting, Cuff Size: Normal)   Pulse 66   Temp 97.9 F (36.6 C) (Oral)   Resp 16   Ht 5\' 5"  (1.651 m)   Wt 118 lb 8 oz (53.8 kg)   SpO2 100%   BMI 19.72 kg/m

## 2016-03-02 NOTE — Progress Notes (Signed)
Department of Radiation Oncology  Phone:  [phone] Fax:        [phone]  Weekly Treatment Note    Name: Alexis [name] Date: 03/02/2016 MRN: [id number] DOB: 1950-08-07   Diagnosis:     ICD-9-CM ICD-10-CM   1. Breast cancer of upper-outer quadrant of right female breast ([place]) 174.4 C50.411      Current dose: 47.5 Gy  Current fraction: 19   MEDICATIONS: Current Outpatient Prescriptions  Medication Sig Dispense Refill  . aspirin 81 MG tablet Take 81 mg by mouth daily.     Marland Kitchen b complex vitamins tablet Take 1 tablet by mouth daily.    . calcium citrate-vitamin D (CITRACAL+D) 315-200 MG-UNIT tablet Take 1 tablet by mouth daily.    . famotidine (CVS ACID CONTROLLER MAX ST) 20 MG tablet Take 20 mg by mouth 2 (two) times daily.    . hyaluronate sodium (RADIAPLEXRX) GEL Apply 1 application topically 2 (two) times daily.    . iron polysaccharides (NIFEREX) 150 MG capsule Take 1 capsule (150 mg total) by mouth daily. 30 capsule 2  . Naproxen (NAPROXEN) 375 MG TBEC Take by mouth.    . non-metallic deodorant Jethro Poling) MISC Apply 1 application topically daily as needed.    . pantoprazole (PROTONIX) 40 MG tablet TAKE 1 BY MOUTH DAILY 90 tablet 3  . pravastatin (PRAVACHOL) 40 MG tablet TAKE 1 BY MOUTH EVERY EVENING 90 tablet 3   No current facility-administered medications for this encounter.      ALLERGIES: Latex   LABORATORY DATA:  Lab Results  Component Value Date   WBC 4.9 02/22/2016   HGB 11.7 02/22/2016   HCT 35.9 02/22/2016   MCV 82.5 02/22/2016   PLT 169 02/22/2016   Lab Results  Component Value Date   NA 143 02/22/2016   K 3.8 02/22/2016   CL 103 04/21/2015   CO2 29 02/22/2016   Lab Results  Component Value Date   ALT 15 02/22/2016   AST 20 02/22/2016   ALKPHOS 82 02/22/2016   BILITOT 0.87 02/22/2016     NARRATIVE: Alexis [name] was seen today for weekly treatment management. The chart was checked and the patient's films were reviewed.  weekly rad  tx right breast 19, mild erythema , nipple inverted s/p surgery, using radiaplex bid, no pain,  Appetite good.  Having mild fatigue.  EOT instructions given.  Will sop by the waiting area to get her one month card to come back to see Alexis [name], P.A. in a month. Wt Readings from Last 3 Encounters:  03/02/16 118 lb 8 oz (53.8 kg)  02/22/16 119 lb 14.4 oz (54.4 kg)  02/22/16 119 lb (54 kg)  BP (!) 144/72 (BP Location: Left Arm, Patient Position: Sitting, Cuff Size: Normal)   Pulse 66   Temp 97.9 F (36.6 C) (Oral)   Resp 16   Ht 5\' 5"  (1.651 m)   Wt 118 lb 8 oz (53.8 kg)   SpO2 100%   BMI 19.72 kg/m    PHYSICAL EXAMINATION: height is 5\' 5"  (1.651 m) and weight is 118 lb 8 oz (53.8 kg). Her oral temperature is 97.9 F (36.6 C). Her blood pressure is 144/72 (abnormal) and her pulse is 66. Her respiration is 16 and oxygen saturation is 100%.      Dermatitis with some erythema/hyperpigmentation present in the treatment area. Overall her skin looks quite good for finishing treatment. No significant desquamation.  ASSESSMENT: The patient is doing satisfactorily with treatment.  PLAN: We will continue with the patient's radiation treatment as planned. The patient has one more fraction and then he will follow-up in our clinic for follow-up in one month.

## 2016-03-02 NOTE — Progress Notes (Signed)
  Radiation Oncology         [phone] ________________________________  Name: Alexis [name] MRN: [id number]  Date: 02/24/2016  DOB: [birth year]  Complex simulation note  The patient has undergone complex simulation for her upcoming boost treatment for her diagnosis of breast cancer. The patient has initially been planned to receive 42.5 Gy. The patient will now receive a 7.5 Gy boost to the seroma cavity which has been contoured. This will be accomplished using an en face electron field. Based on the depth of the target area, 12 MeV electrons will be used. The patient's final total dose therefore will be 50 Gy. A complex isodose plan from the electron [facility] Carlo calculation is requested for the boost treatment.   _______________________________  Jodelle Gross, MD, PhD

## 2016-03-05 ENCOUNTER — Encounter: Payer: Self-pay | Admitting: Radiation Oncology

## 2016-03-05 ENCOUNTER — Telehealth: Payer: Self-pay | Admitting: *Deleted

## 2016-03-05 ENCOUNTER — Ambulatory Visit
Admission: RE | Admit: 2016-03-05 | Discharge: 2016-03-05 | Disposition: A | Discharge status: Home / Self Care | Payer: BLUE CROSS/BLUE SHIELD | Source: Ambulatory Visit | Attending: Radiation Oncology | Admitting: Radiation Oncology

## 2016-03-05 DIAGNOSIS — Z510 Encounter for antineoplastic radiation therapy: Secondary | ICD-10-CM | POA: Diagnosis not present

## 2016-03-05 NOTE — Addendum Note (Signed)
Encounter addended by: Malena Edman, RN on: 03/05/2016  8:40 AM<BR>    Actions taken: Sign clinical note

## 2016-03-05 NOTE — Telephone Encounter (Signed)
Received vm call from pt stating that she has been thinking about things that Dr. Jana Hakim discussed & decided that she prefers tamoxifen & vaginal estrogen cream.  She states that where she works-[employer] are some changes going on & after 03/17/16 prescriptions will need to go to [facility] but if Dr Jana Hakim wants to call in scripts now they [facility] [place].  She is getting her last radiation treatment today.  Message to Dr. Jana Hakim.

## 2016-03-06 ENCOUNTER — Telehealth: Payer: Self-pay | Admitting: *Deleted

## 2016-03-06 DIAGNOSIS — C50411 Malignant neoplasm of upper-outer quadrant of right female breast: Secondary | ICD-10-CM

## 2016-03-06 NOTE — Telephone Encounter (Signed)
  Oncology Nurse Navigator Documentation  Navigator Location: [facility] (03/06/16 1600) Navigator Encounter Type: Telephone (03/06/16 1600) Telephone: [place] Call (03/06/16 1600)         Patient Visit Type: [id number] (03/06/16 1600) Treatment Phase: Final Radiation Tx (03/06/16 1600)                            Time Spent with Patient: 15 (03/06/16 1600)

## 2016-03-08 ENCOUNTER — Telehealth: Payer: Self-pay | Admitting: *Deleted

## 2016-03-08 ENCOUNTER — Other Ambulatory Visit: Payer: Self-pay | Admitting: Internal Medicine

## 2016-03-08 MED ORDER — TAMOXIFEN CITRATE 20 MG PO TABS: 20.0000 mg | ORAL_TABLET | Freq: Every day | ORAL | 0 refills | Status: DC

## 2016-03-08 MED ORDER — ESTRADIOL 10 MCG VA TABS: 1.0000 | ORAL_TABLET | VAGINAL | 0 refills | Status: DC

## 2016-03-09 NOTE — Telephone Encounter (Signed)
This RN left VM on pt's identified VM for work number to return call regarding new prescriptions called in to her pharmacy for fill with current benefits.  Pt is to start the tamoxifen on 10/1 and then may start the vagifem 2 weeks post tamoxifen.  This RN's name left for return call.

## 2016-03-14 ENCOUNTER — Other Ambulatory Visit: Payer: Self-pay | Admitting: *Deleted

## 2016-03-23 ENCOUNTER — Ambulatory Visit (INDEPENDENT_AMBULATORY_CARE_PROVIDER_SITE_OTHER): Payer: BLUE CROSS/BLUE SHIELD | Admitting: Podiatry

## 2016-03-23 ENCOUNTER — Ambulatory Visit (INDEPENDENT_AMBULATORY_CARE_PROVIDER_SITE_OTHER): Payer: BLUE CROSS/BLUE SHIELD

## 2016-03-23 ENCOUNTER — Encounter: Payer: Self-pay | Admitting: Podiatry

## 2016-03-23 VITALS — BP 151/81 | HR 65 | Resp 16

## 2016-03-23 DIAGNOSIS — M779 Enthesopathy, unspecified: Secondary | ICD-10-CM | POA: Diagnosis not present

## 2016-03-23 DIAGNOSIS — Z472 Encounter for removal of internal fixation device: Secondary | ICD-10-CM

## 2016-03-23 DIAGNOSIS — M79671 Pain in right foot: Secondary | ICD-10-CM

## 2016-03-23 NOTE — Progress Notes (Signed)
Subjective:     Patient ID: Alexis [name], female   DOB: 20-Oct-1950, 65 y.o.   MRN: [id number]  HPI patient presents stating that she has a bump on top of her first metatarsal right were we did surgery and she can get some mild to moderate discomfort underneath her metatarsals   Review of Systems     Objective:   Physical Exam Neurovascular status intact with muscle strength adequate and well-healed surgical site right first metatarsal with excellent range of motion first MPJ with prominence of the metatarsal shaft with mild discomfort underneath the metatarsal phalangeal joints right over left    Assessment:     Probable abnormal pin position right first metatarsal with mild to moderate inflammatory capsulitis bilateral    Plan:     H&P x-ray reviewed and today I discussed pin removal and patient wants this performed and I allowed her to read a consent form for removal of the pin from the first metatarsal. Understands risk signs consent form and is scheduled for office surgery for this and I did dispensed metatarsal pads to take pressure off the lesser MPJs right  X-ray report was negative for changes within the first metatarsal with excellent healing first MPJ with prominent pin position of the metatarsal shaft

## 2016-03-25 NOTE — Progress Notes (Signed)
  Radiation Oncology         [phone] ________________________________  Name: Alexis [name] MRN: [id number]  Date: 03/05/2016  DOB: [birth year]  End of Treatment Note  Diagnosis:   Right-sided breast cancer     Indication for treatment:  Curative       Radiation treatment dates:   02/06/2016 through 03/05/2016  Site/dose:   The patient initially received a dose of 42.5 Gy in 17 fractions to the breast using whole-breast tangent fields. This was delivered using a 3-D conformal technique. The patient then received a boost to the seroma. This delivered an additional 7.5 Gy in 3 fractions using an en face electron field due to the depth of the seroma. The total dose was 50 Gy.  Narrative: The patient tolerated radiation treatment relatively well.   The patient had some expected skin irritation as she progressed during treatment. Moist desquamation was not present at the end of treatment.  Plan: The patient has completed radiation treatment. The patient will return to radiation oncology clinic for routine followup in one month. I advised the patient to call or return sooner if they have any questions or concerns related to their recovery or treatment. ________________________________  Jodelle Gross, M.D., Ph.D.

## 2016-03-28 ENCOUNTER — Ambulatory Visit (INDEPENDENT_AMBULATORY_CARE_PROVIDER_SITE_OTHER): Payer: BLUE CROSS/BLUE SHIELD | Admitting: Podiatry

## 2016-03-28 ENCOUNTER — Encounter: Payer: Self-pay | Admitting: Podiatry

## 2016-03-28 VITALS — BP 144/68 | HR 60 | Temp 97.5°F | Resp 16

## 2016-03-28 DIAGNOSIS — Z472 Encounter for removal of internal fixation device: Secondary | ICD-10-CM

## 2016-03-28 NOTE — Progress Notes (Signed)
Subjective:     Patient ID: Alexis [name], female   DOB: 04/02/1951, 65 y.o.   MRN: [id number]  HPI patient presents with painful. Of the right first metatarsal and states that she's unable to wear shoes   Review of Systems     Objective:   Physical Exam Neurovascular status intact with patient found to have painful pin position right first metatarsal that makes shoe gear difficult    Assessment:     Abnormal pin position post bunionectomy    Plan:     Advised on removal of pins explaining procedure and risk and at this time I infiltrated 60 mg like Marcaine mixture and under sterile conditions and after inflation of tourniquet to 250 mmHg I exposed the first metatarsal made a small 3 cm incision took it down through subcutaneous tissues tissue and hemostasis as acquired as necessary to capsule made a linear capsular incision and went ahead today and remove the pin in toto. I flushed the area and applied stitches of 5-0 nylon symptoms fashion and sterile dressing with capillary fill noted to be immediate to all digits on the foot

## 2016-03-30 NOTE — Progress Notes (Signed)
DOS 10.11.2017 Removal Pin From Right 1st Metatarsal

## 2016-04-06 ENCOUNTER — Telehealth: Payer: Self-pay | Admitting: *Deleted

## 2016-04-06 NOTE — Telephone Encounter (Signed)
Pt states she had surgery 1 week ago to remove a pin that had backed out, and she forgot what Dr. Paulla Dolly stated about showering.  I informed pt she could take a quick shower, no soaking or standing water, pat dry and cover with a dry bandaid no ointment. Pt states understanding.

## 2016-04-13 ENCOUNTER — Ambulatory Visit (INDEPENDENT_AMBULATORY_CARE_PROVIDER_SITE_OTHER): Payer: BLUE CROSS/BLUE SHIELD | Admitting: Podiatry

## 2016-04-13 ENCOUNTER — Ambulatory Visit
Admission: RE | Admit: 2016-04-13 | Discharge: 2016-04-13 | Disposition: A | Discharge status: Home / Self Care | Payer: BLUE CROSS/BLUE SHIELD | Source: Ambulatory Visit | Attending: Radiation Oncology | Admitting: Radiation Oncology

## 2016-04-13 ENCOUNTER — Ambulatory Visit (INDEPENDENT_AMBULATORY_CARE_PROVIDER_SITE_OTHER): Payer: BLUE CROSS/BLUE SHIELD

## 2016-04-13 ENCOUNTER — Encounter: Payer: Self-pay | Admitting: Radiation Oncology

## 2016-04-13 VITALS — BP 150/77 | HR 78 | Temp 97.8°F

## 2016-04-13 VITALS — BP 147/54 | HR 57 | Temp 98.1°F | Resp 16 | Ht 65.0 in | Wt 117.8 lb

## 2016-04-13 DIAGNOSIS — Z472 Encounter for removal of internal fixation device: Secondary | ICD-10-CM

## 2016-04-13 DIAGNOSIS — Z79899 Other long term (current) drug therapy: Secondary | ICD-10-CM | POA: Insufficient documentation

## 2016-04-13 DIAGNOSIS — Z170 Estrogen receptor positive status [ER+]: Secondary | ICD-10-CM

## 2016-04-13 DIAGNOSIS — M779 Enthesopathy, unspecified: Secondary | ICD-10-CM | POA: Diagnosis not present

## 2016-04-13 DIAGNOSIS — C50411 Malignant neoplasm of upper-outer quadrant of right female breast: Secondary | ICD-10-CM

## 2016-04-13 DIAGNOSIS — Z7982 Long term (current) use of aspirin: Secondary | ICD-10-CM | POA: Diagnosis not present

## 2016-04-13 DIAGNOSIS — C50911 Malignant neoplasm of unspecified site of right female breast: Secondary | ICD-10-CM | POA: Diagnosis present

## 2016-04-13 DIAGNOSIS — Z9889 Other specified postprocedural states: Secondary | ICD-10-CM

## 2016-04-13 NOTE — Progress Notes (Signed)
Radiation Oncology         [phone] ________________________________  Name: Alexis [name] MRN: [id number]  Date: 04/13/2016  DOB: [birth year]  Follow-Up Visit Note  CC: Christinia Gully, MD  Tanda Rockers, MD  Diagnosis:   right-sided breast cancer  Interval Since Last Radiation:  Approximately one month   Narrative:  The patient returns today for routine follow-up.  She has done well overall since she finished treatment. The patient's skin has healed significantly since she completed her course of radiation treatment. She has begun anti-hormonal treatment.                              ALLERGIES:  is allergic to latex.  Meds: Current Outpatient Prescriptions  Medication Sig Dispense Refill  . aspirin 81 MG tablet Take 81 mg by mouth daily.     Marland Kitchen b complex vitamins tablet Take 1 tablet by mouth daily.    . calcium citrate-vitamin D (CITRACAL+D) 315-200 MG-UNIT tablet Take 1 tablet by mouth daily.    . famotidine ([employer] ACID CONTROLLER MAX ST) 20 MG tablet Take 20 mg by mouth 2 (two) times daily.    Marland Kitchen FERREX 150 150 MG capsule TAKE 1 CAPSULE (150 MG TOTAL) BY MOUTH DAILY. 30 capsule 2  . Naproxen (NAPROXEN) 375 MG TBEC Take by mouth.    . pantoprazole (PROTONIX) 40 MG tablet TAKE 1 BY MOUTH DAILY 90 tablet 3  . pravastatin (PRAVACHOL) 40 MG tablet TAKE 1 BY MOUTH EVERY EVENING 90 tablet 3  . tamoxifen (NOLVADEX) 20 MG tablet Take 1 tablet (20 mg total) by mouth daily. 90 tablet 0  . Estradiol 10 MCG TABS vaginal tablet Place 1 tablet (10 mcg total) vaginally 2 (two) times a week. (Patient not taking: Reported on 04/13/2016) 8 tablet 0   No current facility-administered medications for this encounter.     Physical Findings: The patient is in no acute distress. Patient is alert and oriented.  height is 5\' 5"  (1.651 m) and weight is 117 lb 12.8 oz (53.4 kg). Her oral temperature is 98.1 F (36.7 C). Her blood pressure is 147/54 (abnormal) and her pulse is 57 (abnormal). Her  respiration is 16. .   The skin in the treatment area has healed satisfactorily, no areas of concern/moist desquamation/poor healing  Lab Findings: Lab Results  Component Value Date   WBC 4.9 02/22/2016   HGB 11.7 02/22/2016   HCT 35.9 02/22/2016   MCV 82.5 02/22/2016   PLT 169 02/22/2016     Radiographic Findings: No results found.  Impression:    The patient has done satisfactorily since finishing treatment. She has begun anti-hormonal treatment.  Plan:  The patient will followup in our clinic on a when necessary basis.   Jodelle Gross, M.D., Ph.D.

## 2016-04-13 NOTE — Progress Notes (Signed)
Alexis [name] here for rasessment S/P XRT to her right Breast.  She reports intermittent "sorness" in the low axillary region.  Tamoxifen started 2 wks ago. Mild fatigue which she attributes to her work hours and questions if Tamoxifen has caused fatigue.  Denie any joint pain nor nuscle aches.       BP (!) 147/54 (BP Location: Left Arm, Patient Position: Sitting, Cuff Size: Normal)   Pulse (!) 57   Temp 98.1 F (36.7 C) (Oral)   Resp 16   Ht 5\' 5"  (1.651 m)   Wt 117 lb 12.8 oz (53.4 kg)   BMI 19.60 kg/m  Mild fatigue which she attributes to      [facility] Readings from Last 3 Encounters:  04/13/16 117 lb 12.8 oz (53.4 kg)  03/02/16 118 lb 8 oz (53.8 kg)  02/22/16 119 lb 14.4 oz (54.4 kg)

## 2016-04-14 NOTE — Progress Notes (Signed)
Subjective: Alexis [name] is a 65 y.o. is seen today in office s/p right pin removal. She presents today for suture removal. She is doing well and wearing a regular shoe with no issue and she is having no pain. Denies any systemic complaints such as fevers, chills, nausea, vomiting. No calf pain, chest pain, shortness of breath.   Objective: General: No acute distress, AAOx3  DP/PT pulses palpable 2/4, CRT < 3 sec to all digits.  Protective sensation intact. Motor function intact.  Right foot: Incision is well coapted without any evidence of dehiscence and sutures intact. There is no surrounding erythema, ascending cellulitis, fluctuance, crepitus, malodor, drainage/purulence. There is minimal edema around the surgical site. There is no pain along the surgical site.  No other areas of tenderness to bilateral lower extremities.  No other open lesions or pre-ulcerative lesions.  No pain with calf compression, swelling, warmth, erythema.   Assessment and Plan:  Status post hardware removal, doing well with no complications   -Treatment options discussed including all alternatives, risks, and complications -Sutures removed without complications. Antibiotic ointment applied.  -Continue with regular shoe as tolerated and gradually increase activity.  -Ice/elevation -Pain medication as needed. -Monitor for any clinical signs or symptoms of infection and DVT/PE and directed to call the office immediately should any occur or go to the ER. -Follow-up in 4 weeks if any issues or sooner if any problems arise. In the meantime, encouraged to call the office with any questions, concerns, change in symptoms.   Celesta Gentile, DPM

## 2016-04-23 ENCOUNTER — Encounter: Payer: Self-pay | Admitting: Internal Medicine

## 2016-04-23 ENCOUNTER — Other Ambulatory Visit (INDEPENDENT_AMBULATORY_CARE_PROVIDER_SITE_OTHER): Payer: BLUE CROSS/BLUE SHIELD

## 2016-04-23 ENCOUNTER — Encounter (INDEPENDENT_AMBULATORY_CARE_PROVIDER_SITE_OTHER): Payer: Self-pay

## 2016-04-23 ENCOUNTER — Ambulatory Visit (INDEPENDENT_AMBULATORY_CARE_PROVIDER_SITE_OTHER): Payer: BLUE CROSS/BLUE SHIELD | Admitting: Internal Medicine

## 2016-04-23 VITALS — BP 116/80 | HR 67 | Ht 63.75 in | Wt 117.6 lb

## 2016-04-23 DIAGNOSIS — Z23 Encounter for immunization: Secondary | ICD-10-CM

## 2016-04-23 DIAGNOSIS — M5410 Radiculopathy, site unspecified: Secondary | ICD-10-CM

## 2016-04-23 DIAGNOSIS — E785 Hyperlipidemia, unspecified: Secondary | ICD-10-CM

## 2016-04-23 DIAGNOSIS — D509 Iron deficiency anemia, unspecified: Secondary | ICD-10-CM

## 2016-04-23 DIAGNOSIS — Z0000 Encounter for general adult medical examination without abnormal findings: Secondary | ICD-10-CM

## 2016-04-23 DIAGNOSIS — N63 Unspecified lump in unspecified breast: Secondary | ICD-10-CM

## 2016-04-23 LAB — LIPID PANEL
CHOL/HDL RATIO: 4
Cholesterol: 163 mg/dL (ref 0–200)
HDL: 43.6 mg/dL (ref >39.00)
LDL CALC: 88 mg/dL (ref 0–99)
NonHDL: 119.3
TRIGLYCERIDES: 155 mg/dL — AB (ref 0.0–149.0)
VLDL: 31 mg/dL (ref 0.0–40.0)

## 2016-04-23 LAB — TSH: TSH: 1.75 u[IU]/mL (ref 0.35–4.50)

## 2016-04-23 NOTE — Patient Instructions (Addendum)
Prevnar today   Take the aleve twice daily per bottle as needed with meals but call if not satisfied for referral to orthopedics   Sign release for Dr Harley Hallmark evaluation  Please remember to go to the lab  department downstairs for your tests - we will call you with the results when they are available.   Please schedule a follow up visit in 12 months but call sooner if needed for cpx

## 2016-04-23 NOTE — Assessment & Plan Note (Signed)
Referred to NS 10/28/2015 > [facility] eval/ neg surgical candidate   rec max nsaids with meals  and f/u ortho prn

## 2016-04-23 NOTE — Progress Notes (Signed)
lmtcb

## 2016-04-23 NOTE — Assessment & Plan Note (Signed)
-   Target LDL < 160 in absence of risk factors   Lab Results  Component Value Date   CHOL 163 04/23/2016   HDL 43.60 04/23/2016   LDLCALC 88 04/23/2016   LDLDIRECT 151.4 07/04/2010   TRIG 155.0 (H) 04/23/2016   CHOLHDL 4 04/23/2016   Adequate control on present rx, reviewed > no change in rx needed

## 2016-04-23 NOTE — Progress Notes (Signed)
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 Follow-up    Pt c/o left side back pain that radiates all the way down her leg. She states pain comes and goes and started a few months. She states pain is worse when she lies down and after she sits for a while and then starts walking.   back pain rad to L knee/foot x 4 months,  no numbness/ weakness or h/o rash using lots of nsaids including hs dose rec Please see patient coordinator before you leave today  to schedule neurosurgery> no need for surgery Take your anti-inflammatory with meals as per bottle and share it with neurosurgery so they don't duplicate your treatment    12/22/15 R lumpectomy with neg node / radiation/ nolvadex    04/23/2016  f/u ov/Eilleen Davoli re:  CPX/ Anemia/ radicular L leg pain/hyperlipidemia  Chief Complaint  Patient presents with  . Annual Exam    Pt is fasting. She is doing well and denies any new co's today.   no change in pain L leg/ naprosyn with supper one daily seems to help some    No obvious day to day or daytime variabilty or assoc chronic cough or cp or chest tightness, subjective wheeze overt sinus   symptoms. No unusual exp hx or h/o childhood pna/ asthma or knowledge of premature birth.  Sleeping ok without nocturnal  or early am exacerbation  of respiratory  c/o's or need for noct saba. Also denies any obvious fluctuation of symptoms with weather  or environmental changes or other aggravating or alleviating factors except as outlined above   Current Medications, Allergies, Complete Past Medical History, Past Surgical History, Family History, and Social History were reviewed in [employer] record.  ROS  The following are not active complaints unless bolded sore throat, dysphagia, dental problems, itching, sneezing,  nasal congestion or excess/ purulent secretions, ear ache,   fever, chills, sweats, unintended wt loss, pleuritic or exertional cp, hemoptysis,  orthopnea pnd or leg swelling, presyncope, palpitations, heartburn, abdominal pain, anorexia, nausea, vomiting, diarrhea  or change in bowel or urinary habits, change in stools or urine, dysuria,hematuria,  rash, arthralgias, visual complaints, headache, numbness weakness or ataxia or problems with walking or coordination,  change in mood/affect or memory.         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  IRRITABLE BOWEL SYNDROME (ICD-564.1)  OSTEOPENIA (ICD-733.90)  - DEXA 07/04/06 Tspine .03, Left hip -.94, L fem Neck -1.4  - DEXA 07/12/08 Tspine -.2 Left F -.7 Right Fem -.4  - DEXA 08/15/10  T spine - 0.3, LF -0.8  RF -0.8  KYPHOSCOLIOSIS, 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  Vital signs reviewed  wt 140 > 135 06/07/08 > 139 07/26/08 > 138 June 24, 2009 > 140 August 09, 2009 > 126 Oct 26, 2009 > 122 July 04, 2010 > 07/26/2011  123> 03/25/2013  125  > 04/15/2014   122 > 04/21/2015  > 07/29/2015  121 >  10/28/2015 119 > 04/23/2016    HEENT: nl dentition, turbinates, and orophanx -  L ear with dry wax   Neck without JVD/Nodes/TM  Lungs clear to A and P bilaterally without cough on insp or exp maneuvers  RRR no s3 or murmur or increase in P2  Abd soft and benign with nl excursion in the supine position. No bruits or organomegaly  Ext warm without calf tenderness, cyanosis clubbing or edema MS nl gait, no joint deformities or restrictions/ neg SLR on L, FROM  L hip Neuro intact sensorium, no motor deficits- L leg nl sensation/ strength/reflexes/ heal and toe standing.  Skin no lesions             Labs ordered/ reviewed:      Chemistry      Component Value Date/Time   NA 143 02/22/2016 1100   K 3.8 02/22/2016 1100   CL 103 04/21/2015 0932   CO2 29 02/22/2016 1100   BUN 13.5 02/22/2016 1100   CREATININE 0.7 02/22/2016 1100      Component Value Date/Time   CALCIUM 9.3 02/22/2016 1100   ALKPHOS 82 02/22/2016 1100   AST 20 02/22/2016 1100   ALT 15 02/22/2016 1100   BILITOT 0.87 02/22/2016 1100        Lab Results  Component Value Date   WBC 4.9 02/22/2016   HGB 11.7 02/22/2016   HCT 35.9 02/22/2016   MCV 82.5 02/22/2016   PLT 169 02/22/2016        Lab Results  Component Value Date   TSH 1.75 04/23/2016                          Assessment & Plan:

## 2016-04-23 NOTE — Assessment & Plan Note (Signed)
H/o fe def  1999, recurred early 2017 while donating blood - rx NuIron  07/29/15 >>>   Resolved/ f/u Dr Jana Hakim

## 2016-04-23 NOTE — Assessment & Plan Note (Signed)
-   mammos neg 09/10/13 and 09/17/14  - declined flu 04/15/14 and pneumovax and 04/21/2015  - Prevnar 13  04/23/2016

## 2016-04-23 NOTE — Assessment & Plan Note (Signed)
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

## 2016-04-23 NOTE — Progress Notes (Signed)
Spoke with pt and notified of results per Dr. Wert. Pt verbalized understanding and denied any questions. 

## 2016-05-23 ENCOUNTER — Ambulatory Visit (HOSPITAL_BASED_OUTPATIENT_CLINIC_OR_DEPARTMENT_OTHER): Payer: BLUE CROSS/BLUE SHIELD | Admitting: Oncology

## 2016-05-23 VITALS — BP 163/68 | HR 67 | Temp 98.0°F | Resp 18 | Ht 63.75 in | Wt 116.9 lb

## 2016-05-23 DIAGNOSIS — Z170 Estrogen receptor positive status [ER+]: Secondary | ICD-10-CM | POA: Diagnosis not present

## 2016-05-23 DIAGNOSIS — C50411 Malignant neoplasm of upper-outer quadrant of right female breast: Secondary | ICD-10-CM

## 2016-05-23 MED ORDER — TAMOXIFEN CITRATE 20 MG PO TABS: 20.0000 mg | ORAL_TABLET | Freq: Every day | ORAL | 4 refills | Status: DC

## 2016-05-23 NOTE — Progress Notes (Signed)
[facility]  [phone] [phone]     ID: Alexis [name] DOB: 1950-08-22  MR#: [id number]  MLJ#:[id number]  Patient Care Team: Tanda Rockers, MD as PCP - General (Pulmonary Disease) Alphonsa Overall, MD as Consulting Physician (General Surgery) Chauncey Cruel, MD as Consulting Physician (Oncology) Kyung Rudd, MD as Consulting Physician (Radiation Oncology) Leeroy Cha, MD as Consulting Physician (Neurosurgery) Allyn Kenner, MD (Dermatology) Wallene Huh, DPM as Consulting Physician (Podiatry) PCP: Christinia Gully, MD OTHER MD:  CHIEF COMPLAINT: estrogen receptor positive breast cancer  CURRENT TREATMENT:  Tamoxifen   BREAST CANCER HISTORY: From the original intake note:  Alexis [name] had routine mammographic screening with tomography at the [facility] 11/04/2015. This showed an area of distortion and calcifications in the right breast.  Right diagnostic mammography with tomography and right breast ultrasonography at the [facility] 11/15/2015 on the breast density to be categoryC.  In the upper-outer quadrant of the right breast there was an area of distortion associated with pleomorphic calcifications measuring approximately 1 cm. On exam there was no palpable mass.  By ultrasonography, there was an irregular hypoechoic mass superiorly measuring 1.2 cm. Ultrasound of the axilla was benign.  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  Her subsequent history is as detailed below  INTERVAL HISTORY: Alexis [name] returns today for follow-up of her estrogen receptor positive breast cancer. After completing her radiation treatments in September she started tamoxifen  the first week in October.  She is delighted to report that she's having no side effects from this. She has had no hot flashes and no vaginal dryness problems. She is obtaining the drug at $12 per month which is very reasonable.  REVIEW OF SYSTEMS: A detailed review of systems today was otherwise stable  PAST MEDICAL HISTORY: Past Medical History:  Diagnosis Date  . Breast cancer ([place])   . Chronic fatigue   . Cough   . GERD (gastroesophageal reflux disease)   . Hemangioma   . Hyperlipidemia   . IBS (irritable bowel syndrome)   . Iron deficiency anemia   . Kyphosis of thoracic region   . MVP (mitral valve prolapse)   . Osteopenia   . Vertigo     PAST SURGICAL HISTORY: Past Surgical History:  Procedure Laterality Date  . APPENDECTOMY    . BREAST LUMPECTOMY WITH RADIOACTIVE SEED AND SENTINEL LYMPH NODE BIOPSY Right 12/22/2015   Procedure: BREAST LUMPECTOMY WITH RADIOACTIVE SEED AND SENTINEL LYMPH NODE BIOPSY;  Surgeon: Alphonsa Overall, MD;  Location: [place];  Service: General;  Laterality: Right;  . FOOT SURGERY Right    bunionectomy  . [facility]  2003   s/p embolization per Dr. Estanislado Pandy  . TONSILLECTOMY    . TUBAL LIGATION      FAMILY HISTORY Family History  Problem Relation Age of Onset  . Hyperlipidemia Mother   . Breast cancer Mother   . Heart attack Father   . Diabetes Brother   . Breast cancer Maternal Aunt   . Breast cancer Paternal Aunt   the patient's father died from a ruptured aneurysm at the age of 3. The patient's mother was diagnosed with breast cancer at the age of 1 and died at age 32. The patient  has a paternal aunt diagnosed with breast cancer at age 17. The patient has 2 brothers, 2 sisters. Ne brother has had a history of aneurysms, as has the patient There is no history of ovarian cancer in the family.  GYNECOLOGIC HISTORY:  No LMP recorded. Patient is postmenopausal. Menarche age 39, first live birth age 5, the patient is [place] P2. She  stopped having periods at age 61.He did not take hormone replacement. 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 Daughter Alexis [name] lives in [place] where she works as an Medical illustrator. (Adopted) son Alexis [name] lives in [place] where he is a Museum/gallery curator. 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 The patient has 3 grandchildren aged 25, 53, and 76. She attends a [employer]    ADVANCED DIRECTIVES: not in place   HEALTH MAINTENANCE: Social History  Substance Use Topics  . Smoking status: Never Smoker  . Smokeless tobacco: Never Used  . Alcohol use No     Colonoscopy: 2012/[facility]  PAP:2012  Bone density: 2015  Lipid panel:  Allergies  Allergen Reactions  . Latex Rash    Contact rash    Current Outpatient Prescriptions  Medication Sig Dispense Refill  . aspirin 81 MG tablet Take 81 mg by mouth daily.     Marland Kitchen b complex vitamins tablet Take 1 tablet by mouth daily.    . calcium citrate-vitamin D (CITRACAL+D) 315-200 MG-UNIT tablet Take 1 tablet by mouth daily.    . Estradiol 10 MCG TABS vaginal tablet Place 1 tablet (10 mcg total) vaginally 2 (two) times a week. 8 tablet 0  . famotidine ([employer] ACID CONTROLLER MAX ST) 20 MG tablet Take 20 mg by mouth 2 (two) times daily.    Marland Kitchen FERREX 150 150 MG capsule TAKE 1 CAPSULE (150 MG TOTAL) BY MOUTH DAILY. 30 capsule 2  . Naproxen (NAPROXEN) 375 MG TBEC Take by mouth.    . pantoprazole (PROTONIX) 40 MG tablet TAKE 1 BY MOUTH DAILY 90 tablet 3  . pravastatin (PRAVACHOL) 40 MG tablet TAKE 1 BY MOUTH EVERY EVENING 90 tablet 3  . tamoxifen (NOLVADEX) 20 MG tablet Take 1 tablet (20 mg total) by mouth daily. 90 tablet 4   No current facility-administered medications for this visit.     OBJECTIVE: middle-aged white woman In no acute  distress Vitals:   05/23/16 1134  BP: (!) 163/68  Pulse: 67  Resp: 18  Temp: 98 F (36.7 C)     Body mass index is 20.22 kg/m.    ECOG FS:0 - Asymptomatic  Sclerae unicteric, pupils round and equal Oropharynx clear and moist-- no thrush or other lesions No cervical or supraclavicular adenopathy Lungs no rales or rhonchi Heart regular rate and rhythm Abd soft, nontender, positive bowel sounds MSK no focal spinal tenderness, no upper extremity lymphedema Neuro: nonfocal, well oriented, appropriate affect Breasts: The right breast is status post lumpectomy and radiation with no evidence of disease recurrence. The right axilla is benign. Left breast is unremarkable.  LAB RESULTS:  CMP     Component Value Date/Time   NA 143 02/22/2016 1100   K 3.8 02/22/2016 1100   CL 103 04/21/2015 0932   CO2 29 02/22/2016 1100   GLUCOSE 87 02/22/2016 1100   GLUCOSE 106 (H) 05/24/2006 0950   BUN  13.5 02/22/2016 1100   CREATININE 0.7 02/22/2016 1100   CALCIUM 9.3 02/22/2016 1100   PROT 7.2 02/22/2016 1100   ALBUMIN 3.7 02/22/2016 1100   AST 20 02/22/2016 1100   ALT 15 02/22/2016 1100   ALKPHOS 82 02/22/2016 1100   BILITOT 0.87 02/22/2016 1100   GFRNONAA 90.91 06/24/2009 0931   GFRAA 111 06/07/2008 0909    INo results found for: SPEP, UPEP  Lab Results  Component Value Date   WBC 4.9 02/22/2016   NEUTROABS 3.1 02/22/2016   HGB 11.7 02/22/2016   HCT 35.9 02/22/2016   MCV 82.5 02/22/2016   PLT 169 02/22/2016      Chemistry      Component Value Date/Time   NA 143 02/22/2016 1100   K 3.8 02/22/2016 1100   CL 103 04/21/2015 0932   CO2 29 02/22/2016 1100   BUN 13.5 02/22/2016 1100   CREATININE 0.7 02/22/2016 1100      Component Value Date/Time   CALCIUM 9.3 02/22/2016 1100   ALKPHOS 82 02/22/2016 1100   AST 20 02/22/2016 1100   ALT 15 02/22/2016 1100   BILITOT 0.87 02/22/2016 1100       No results found for: LABCA2  No components found for: LABCA125  No results for  input(s): INR in the last 168 hours.  Urinalysis    Component Value Date/Time   COLORURINE YELLOW 04/21/2015 0932   APPEARANCEUR CLEAR 04/21/2015 0932   LABSPEC 1.010 04/21/2015 0932   PHURINE 6.0 04/21/2015 0932   GLUCOSEU NEGATIVE 04/21/2015 0932   HGBUR NEGATIVE 04/21/2015 0932   BILIRUBINUR NEGATIVE 04/21/2015 0932   KETONESUR NEGATIVE 04/21/2015 0932   UROBILINOGEN 0.2 04/21/2015 0932   NITRITE NEGATIVE 04/21/2015 0932   LEUKOCYTESUR NEGATIVE 04/21/2015 0932      ELIGIBLE FOR AVAILABLE RESEARCH PROTOCOL: no  STUDIES: No results found.  ASSESSMENT: 65 y.o. [place], [place] woman status post right breast upper outer quadrant biopsy 11/16/2015 for a clinical T1b N0, stage IA invasive ductal carcinoma, grade 1, estrogen receptor positive, progesterone receptor negative, HER-2 nonamplified, with an MIB-1 of 5%.  (1) status post right lumpectomy 12/22/2015 for a pT1c pN0, stage IA  invasive ductal carcinoma, grade 1, repeat HER-2 again negative  (2) Oncotype DX score of 21 predicts a risk of recurrence outside the breast over the next 10 years of 14% if the patient's only systemic therapy is tamoxifen for 5 years. It predicts a benefit from chemotherapy in the 3-4% range.  (3) adjuvant radiation 02/06/2016 through 03/05/2016 Site/dose:   The patient initially received a dose of 42.5 Gy in 17 fractions to the breast using whole-breast tangent fields. This was delivered using a 3-D conformal technique. The patient then received a boost to the seroma. This delivered an additional 7.5 Gy in 3 fractions using an en face electron field due to the depth of the seroma. The total dose was 50 Gy.   (4) Started tamoxifen first week in October 2017  (a) bone density at [facility] 04/16/2014 found osteopenia with a T score of -1.6  (5) the patient does not meet criteria for genetics testing    PLAN: Denajah had many misgivings regarding tamoxifen but she did get herself started on it  and so far is tolerating it quite well. The plan will be to continue on this for a minimum of 5 years.  She already has follow-up through her primary care physician and her surgeon, so I will see her in July, after her yearly mammogram.  She knows to call for any problems that may develop before her next visit here.  Chauncey Cruel, MD   05/23/2016 8:13 PM Medical Oncology and Hematology [facility] [address] [place], [place] [address] Tel. [phone]    Fax. [phone] improve

## 2016-06-01 ENCOUNTER — Ambulatory Visit: Payer: BLUE CROSS/BLUE SHIELD

## 2016-06-06 ENCOUNTER — Encounter: Payer: BLUE CROSS/BLUE SHIELD | Admitting: Adult Health

## 2016-06-07 ENCOUNTER — Telehealth: Payer: Self-pay | Admitting: *Deleted

## 2016-06-07 NOTE — Telephone Encounter (Signed)
Called pt to reschedule appt she missed on yesterday with Alisa Graff.No answer but left a detailed message to VM concerning this. Told pt to call this nurse back at [phone]. Message to be fwd to G.Dawson,NP.

## 2016-06-14 ENCOUNTER — Ambulatory Visit: Payer: BLUE CROSS/BLUE SHIELD | Admitting: *Deleted

## 2016-06-14 DIAGNOSIS — M779 Enthesopathy, unspecified: Secondary | ICD-10-CM

## 2016-06-14 NOTE — Progress Notes (Signed)
Patient ID: Alexis [name], female   DOB: [birth year], 65 y.o.   MRN: [id number]   Patient presents for orthotic pick up.  Verbal and written break in and wear instructions given.  Patient will follow up in 4 weeks if symptoms worsen or fail to improve.

## 2016-06-14 NOTE — Patient Instructions (Signed)

## 2016-09-17 ENCOUNTER — Telehealth: Payer: Self-pay | Admitting: Internal Medicine

## 2016-09-17 MED ORDER — PANTOPRAZOLE SODIUM 40 MG PO TBEC: DELAYED_RELEASE_TABLET | ORAL | 1 refills | Status: DC

## 2016-09-17 MED ORDER — PRAVASTATIN SODIUM 40 MG PO TABS: ORAL_TABLET | ORAL | 1 refills | Status: DC

## 2016-09-17 NOTE — Telephone Encounter (Signed)
Pt requesting 90 day supplies of pravastatin and protonix.  This has been sent to preferred pharmacy.  Nothing further needed.

## 2016-09-19 ENCOUNTER — Other Ambulatory Visit: Payer: Self-pay | Admitting: Oncology

## 2016-09-19 DIAGNOSIS — Z853 Personal history of malignant neoplasm of breast: Secondary | ICD-10-CM

## 2016-11-05 ENCOUNTER — Ambulatory Visit
Admission: RE | Admit: 2016-11-05 | Discharge: 2016-11-05 | Disposition: A | Discharge status: Home / Self Care | Payer: Medicare Other | Source: Ambulatory Visit | Attending: Oncology | Admitting: Oncology

## 2016-11-05 DIAGNOSIS — Z853 Personal history of malignant neoplasm of breast: Secondary | ICD-10-CM

## 2016-11-05 DIAGNOSIS — R928 Other abnormal and inconclusive findings on diagnostic imaging of breast: Secondary | ICD-10-CM | POA: Diagnosis not present

## 2016-11-29 ENCOUNTER — Ambulatory Visit (INDEPENDENT_AMBULATORY_CARE_PROVIDER_SITE_OTHER): Payer: Medicare Other | Admitting: Orthopaedic Surgery

## 2016-11-29 ENCOUNTER — Encounter (INDEPENDENT_AMBULATORY_CARE_PROVIDER_SITE_OTHER): Payer: Self-pay | Admitting: Orthopaedic Surgery

## 2016-11-29 ENCOUNTER — Ambulatory Visit (INDEPENDENT_AMBULATORY_CARE_PROVIDER_SITE_OTHER): Payer: Self-pay

## 2016-11-29 VITALS — BP 161/85 | HR 62 | Ht 65.0 in | Wt 116.0 lb

## 2016-11-29 DIAGNOSIS — M1612 Unilateral primary osteoarthritis, left hip: Secondary | ICD-10-CM

## 2016-11-29 DIAGNOSIS — M25552 Pain in left hip: Secondary | ICD-10-CM | POA: Diagnosis not present

## 2016-11-29 NOTE — Progress Notes (Signed)
Office Visit Note   Patient: Alexis [name]           Date of Birth: May 14, 1951           MRN: [id number] Visit Date: 11/29/2016              Requested by: Tanda Rockers, MD [address], [place] [address] PCP: Tanda Rockers, MD   Assessment & Plan: Visit Diagnoses:  1. Pain in left hip   2. Unilateral primary osteoarthritis, left hip     Plan: Patient is end-stage left hip osteoarthritis she is using a cane had anti-inflammatories. She has subluxation of the hip bone-on-bone changes with subchondral cyst formation and subchondral sclerosis. We discussed options she states she might proceed with total joint arthroplasty. Operative technique discussed. X-ray results were reviewed with patient and gave her copy of the images. She is followed by Dr. Legrand Como work and will obtain medical clearance and she can call our office about surgical scheduling once this is obtained. Plan would be direct anterior hip approach and 1-2 night stay in the hospital.  Follow-Up Instructions: No Follow-up on file.   Orders:  Orders Placed This Encounter  Procedures  . XR HIP UNILAT W OR W/O PELVIS 2-3 VIEWS LEFT   No orders of the defined types were placed in this encounter.     Procedures: No procedures performed   Clinical Data: No additional findings.   Subjective: Chief Complaint  Patient presents with  . Lower Back - Pain    HPI 66 year old female with progressive left hip pain difficulty walking and standing difficulty getting from sitting to standing. She's had pain over a year and the last 8 weeks she's had great difficulty walking. She's taken anti-inflammatories occasionally used a cane she has to pick her left leg up to get in and out of a car. Previous history of the right breast cancer 2017 stage IA on tamoxifen.  Review of Systems 14 point review of systems positive for previous appendectomy. Breast cancer July 2017 sentinel biopsy with radioactive seeds and lymph  node biopsy done by Dr. Alphonsa Overall. Previous bunionectomy right foot. SAH with embolization Dr. Estanislado Pandy 2013 doing well. Previous tonsillectomy and tubal ligation. History of IVH, IBS, iron deficiency anemia. Left hip osteoarthritis, lumbar scoliosis.   Objective: Vital Signs: BP (!) 161/85   Pulse 62   Ht 5\' 5"  (1.651 m)   Wt 116 lb (52.6 kg)   BMI 19.30 kg/m   Physical Exam  Constitutional: She is oriented to person, place, and time. She appears well-developed.  HENT:  Head: Normocephalic.  Right Ear: External ear normal.  Left Ear: External ear normal.  Eyes: Pupils are equal, round, and reactive to light.  Neck: No tracheal deviation present. No thyromegaly present.  Cardiovascular: Normal rate.   Pulmonary/Chest: Effort normal.  Abdominal: Soft.  Musculoskeletal:  Patient some scoliosis no sciatic notch tenderness. She last 5 internal rotation left hip 10 hip flexion contracture with extreme pain. She cannot figure for the left can easily do on the right. Right hip has good internal/external rotation 30 and 40 respectively without pain. These reach full extension distal pulses are intact anterior tib EHL is intact no venous stasis changes no edema.  Neurological: She is alert and oriented to person, place, and time.  Skin: Skin is warm and dry.  Psychiatric: She has a normal mood and affect. Her behavior is normal.    Ortho Exam  Specialty Comments:  No specialty comments available.  Imaging: No results found.   PMFS History: Patient Active Problem List   Diagnosis Date Noted  . Unilateral primary osteoarthritis, left hip 12/02/2016  . Breast cancer of upper-outer quadrant of right female breast ([place]) 11/17/2015  . Breast mass 11/15/2015  . Radicular pain of left lower extremity 10/28/2015  . Microcytic anemia 04/22/2015  . Health care maintenance 03/25/2013  . Arthralgia 07/27/2011  . Premature atrial contractions 06/24/2009  . DIZZINESS OR VERTIGO  05/30/2007  . HEMANGIOMA 05/02/2007  . Hyperlipidemia LDL goal <160 05/02/2007  . [place]  h/o 2003 05/02/2007  . GERD 05/02/2007  . IRRITABLE BOWEL SYNDROME 05/02/2007  . Osteoporosis 05/02/2007  . 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 FATIGUE, CHRONIC 05/02/2007  . COUGH 05/02/2007   Past Medical History:  Diagnosis Date  . Breast cancer ([place]) 11/16/2015   Right Breast  . Chronic fatigue   . Cough   . GERD (gastroesophageal reflux disease)   . Hemangioma   . Hyperlipidemia   . IBS (irritable bowel syndrome)   . Iron deficiency anemia   . Kyphosis of thoracic region   . MVP (mitral valve prolapse)   . Osteopenia   . Vertigo     Family History  Problem Relation Age of Onset  . Hyperlipidemia Mother   . Breast cancer Mother 62  . Heart attack Father   . Diabetes Brother   . Breast cancer Maternal Aunt   . Breast cancer Paternal Aunt 13    Past Surgical History:  Procedure Laterality Date  . APPENDECTOMY    . BREAST BIOPSY Right 11/16/2015   U/S Core- Malignant  . BREAST BIOPSY Left 06/21/2005   Stereo- Benign  . BREAST EXCISIONAL BIOPSY Left 2007  . BREAST LUMPECTOMY Right 12/22/2015  . BREAST LUMPECTOMY WITH RADIOACTIVE SEED AND SENTINEL LYMPH NODE BIOPSY Right 12/22/2015   Procedure: BREAST LUMPECTOMY WITH RADIOACTIVE SEED AND SENTINEL LYMPH NODE BIOPSY;  Surgeon: Alphonsa Overall, MD;  Location: [facility];  Service: General;  Laterality: Right;  . FOOT SURGERY Right    bunionectomy  . [facility]  2003   s/p embolization per Dr. Estanislado Pandy  . TONSILLECTOMY    . TUBAL LIGATION     Social History   Occupational History  . CSR    Social History Main Topics  . Smoking status: Never Smoker  . Smokeless tobacco: Never Used  . Alcohol use No  . Drug use: No  . Sexual activity: Not on file

## 2016-12-02 DIAGNOSIS — M1612 Unilateral primary osteoarthritis, left hip: Secondary | ICD-10-CM | POA: Insufficient documentation

## 2016-12-24 ENCOUNTER — Other Ambulatory Visit (INDEPENDENT_AMBULATORY_CARE_PROVIDER_SITE_OTHER): Payer: Self-pay | Admitting: Orthopaedic Surgery

## 2016-12-24 DIAGNOSIS — M1612 Unilateral primary osteoarthritis, left hip: Secondary | ICD-10-CM

## 2016-12-26 ENCOUNTER — Other Ambulatory Visit (HOSPITAL_BASED_OUTPATIENT_CLINIC_OR_DEPARTMENT_OTHER): Payer: Medicare Other

## 2016-12-26 ENCOUNTER — Ambulatory Visit (HOSPITAL_BASED_OUTPATIENT_CLINIC_OR_DEPARTMENT_OTHER): Payer: Medicare Other | Admitting: Oncology

## 2016-12-26 VITALS — BP 159/64 | HR 55 | Temp 97.8°F | Resp 17 | Ht 65.0 in | Wt 117.2 lb

## 2016-12-26 DIAGNOSIS — Z170 Estrogen receptor positive status [ER+]: Secondary | ICD-10-CM

## 2016-12-26 DIAGNOSIS — M25559 Pain in unspecified hip: Secondary | ICD-10-CM

## 2016-12-26 DIAGNOSIS — C50411 Malignant neoplasm of upper-outer quadrant of right female breast: Secondary | ICD-10-CM

## 2016-12-26 LAB — CBC WITH DIFFERENTIAL/PLATELET
BASO%: 0.7 % (ref 0.0–2.0)
Basophils Absolute: 0 10*3/uL (ref 0.0–0.1)
EOS ABS: 0.1 10*3/uL (ref 0.0–0.5)
EOS%: 1.4 % (ref 0.0–7.0)
HCT: 35.2 % (ref 34.8–46.6)
HEMOGLOBIN: 11.7 g/dL (ref 11.6–15.9)
LYMPH%: 26.6 % (ref 14.0–49.7)
MCH: 29 pg (ref 25.1–34.0)
MCHC: 33.3 g/dL (ref 31.5–36.0)
MCV: 86.9 fL (ref 79.5–101.0)
MONO#: 0.5 10*3/uL (ref 0.1–0.9)
MONO%: 12.7 % (ref 0.0–14.0)
NEUT%: 58.6 % (ref 38.4–76.8)
NEUTROS ABS: 2.4 10*3/uL (ref 1.5–6.5)
PLATELETS: 157 10*3/uL (ref 145–400)
RBC: 4.05 10*6/uL (ref 3.70–5.45)
RDW: 13.6 % (ref 11.2–14.5)
WBC: 4.2 10*3/uL (ref 3.9–10.3)
lymph#: 1.1 10*3/uL (ref 0.9–3.3)

## 2016-12-26 LAB — COMPREHENSIVE METABOLIC PANEL
ALBUMIN: 3.7 g/dL (ref 3.5–5.0)
ALK PHOS: 49 U/L (ref 40–150)
ALT: 17 U/L (ref 0–55)
ANION GAP: 10 meq/L (ref 3–11)
AST: 22 U/L (ref 5–34)
BUN: 12.2 mg/dL (ref 7.0–26.0)
CO2: 29 meq/L (ref 22–29)
CREATININE: 0.7 mg/dL (ref 0.6–1.1)
Calcium: 9.7 mg/dL (ref 8.4–10.4)
Chloride: 106 mEq/L (ref 98–109)
EGFR: 90 mL/min/{1.73_m2} — ABNORMAL LOW (ref >90)
GLUCOSE: 92 mg/dL (ref 70–140)
Potassium: 4 mEq/L (ref 3.5–5.1)
SODIUM: 144 meq/L (ref 136–145)
Total Bilirubin: 0.57 mg/dL (ref 0.20–1.20)
Total Protein: 6.7 g/dL (ref 6.4–8.3)

## 2016-12-26 MED ORDER — TAMOXIFEN CITRATE 20 MG PO TABS: 20.0000 mg | ORAL_TABLET | Freq: Every day | ORAL | 4 refills | Status: DC

## 2016-12-26 NOTE — Progress Notes (Signed)
[facility]  [phone] [phone]     ID: Alexis [name] DOB: [birth year]  MR#: [id number]  LKT#:[id number]  Patient Care Team: Tanda Rockers, MD as PCP - General (Pulmonary Disease) Alphonsa Overall, MD as Consulting Physician (General Surgery) Magrinat, Virgie Dad, MD as Consulting Physician (Oncology) Kyung Rudd, MD as Consulting Physician (Radiation Oncology) Leeroy Cha, MD as Consulting Physician (Neurosurgery) Allyn Kenner, MD (Dermatology) Regal, Tamala Fothergill, DPM as Consulting Physician (Podiatry) PCP: Tanda Rockers, MD OTHER MD:  CHIEF COMPLAINT: estrogen receptor positive breast cancer  CURRENT TREATMENT:  Tamoxifen   BREAST CANCER HISTORY: From the original intake note:  Alexis [name] had routine mammographic screening with tomography at the [facility] 11/04/2015. This showed an area of distortion and calcifications in the right breast.  Right diagnostic mammography with tomography and right breast ultrasonography at the [facility] 11/15/2015 on the breast density to be categoryC.  In the upper-outer quadrant of the right breast there was an area of distortion associated with pleomorphic calcifications measuring approximately 1 cm. On exam there was no palpable mass.  By ultrasonography, there was an irregular hypoechoic mass superiorly measuring 1.2 cm. Ultrasound of the axilla was benign.  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  Her subsequent history is as detailed below  INTERVAL HISTORY: Alexis [name] returns today for follow-up and treatment of her estrogen receptor positive breast cancer accompanied by her husband. She had bilateral  diagnostic mammography at the [facility] 11/05/2016 found the breast density to be category C. There was no evidence of malignancy.  She continues on tamoxifen with excellent tolerance. She obtains it at a very good price.  REVIEW OF SYSTEMS: Alexis [name] is having significant hip problems and is scheduled for hip replacement she tells me 01/11/2017 under Rodell Perna. She never did start the estradiol vaginal treatments. "I did not need them". Aside from these issues a detailed review of systems today was stable  PAST MEDICAL HISTORY: Past Medical History:  Diagnosis Date  . Breast cancer ([place]) 11/16/2015   Right Breast  . Chronic fatigue   . Cough   . GERD (gastroesophageal reflux disease)   . Hemangioma   . Hyperlipidemia   . IBS (irritable bowel syndrome)   . Iron deficiency anemia   . Kyphosis of thoracic region   . MVP (mitral valve prolapse)   . Osteopenia   . Vertigo     PAST SURGICAL HISTORY: Past Surgical History:  Procedure Laterality Date  . APPENDECTOMY    . BREAST BIOPSY Right 11/16/2015   U/S Core- Malignant  . BREAST BIOPSY Left 06/21/2005   Stereo- Benign  . BREAST EXCISIONAL BIOPSY Left 2007  . BREAST LUMPECTOMY Right 12/22/2015  . BREAST LUMPECTOMY WITH RADIOACTIVE SEED AND SENTINEL LYMPH NODE BIOPSY Right 12/22/2015   Procedure: BREAST LUMPECTOMY WITH RADIOACTIVE SEED AND SENTINEL LYMPH NODE BIOPSY;  Surgeon: Alphonsa Overall, MD;  Location: [facility];  Service: General;  Laterality: Right;  . FOOT SURGERY Right    bunionectomy  . [facility]  2003   s/p embolization per Dr. Estanislado Pandy  . TONSILLECTOMY    . TUBAL LIGATION      FAMILY HISTORY Family History  Problem Relation Age of Onset  . Hyperlipidemia  Mother   . Breast cancer Mother 62  . Heart attack Father   . Diabetes Brother   . Breast cancer Maternal Aunt   . Breast cancer Paternal Aunt 11  the patient's father died from a ruptured aneurysm at the age of 77. The patient's mother was  diagnosed with breast cancer at the age of 6 and died at age 20. The patient has a paternal aunt diagnosed with breast cancer at age 77. The patient has 2 brothers, 2 sisters. Ne brother has had a history of aneurysms, as has the patient There is no history of ovarian cancer in the family.  GYNECOLOGIC HISTORY:  No LMP recorded. Patient is postmenopausal. Menarche age 50, first live birth age 59, the patient is [place] P2. She stopped having periods at age 37.He did not take hormone replacement. 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 Daughter Alexis [name] lives in [place] where she works as an Medical illustrator. (Adopted) son Alexis [name] lives in [place] where he is a Museum/gallery curator. 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 The patient has 3 grandchildren aged 83, 38, and 46. She attends a [employer]    ADVANCED DIRECTIVES: not in place   HEALTH MAINTENANCE: Social History  Substance Use Topics  . Smoking status: Never Smoker  . Smokeless tobacco: Never Used  . Alcohol use No     Colonoscopy: 2012/[facility]  PAP:2012  Bone density: 2015  Lipid panel:  Allergies  Allergen Reactions  . Latex Rash    Contact rash    Current Outpatient Prescriptions  Medication Sig Dispense Refill  . aspirin 81 MG tablet Take 81 mg by mouth daily.     Marland Kitchen b complex vitamins tablet Take 1 tablet by mouth daily.    . calcium citrate-vitamin D (CITRACAL+D) 315-200 MG-UNIT tablet Take 1 tablet by mouth daily.    . Naproxen (NAPROXEN) 375 MG TBEC Take by mouth.    . pantoprazole (PROTONIX) 40 MG tablet TAKE 1 BY MOUTH DAILY 90 tablet 1  . pravastatin (PRAVACHOL) 40 MG tablet TAKE 1 BY MOUTH EVERY EVENING 90 tablet 1  . tamoxifen (NOLVADEX) 20 MG tablet Take 1 tablet (20 mg total) by mouth daily. 90  tablet 4   No current facility-administered medications for this visit.     OBJECTIVE: middle-aged white womanWho appears stated age 2:   12/26/16 1125  BP: (!) 159/64  Pulse: (!) 55  Resp: 17  Temp: 97.8 F (36.6 C)     Body mass index is 19.5 kg/m.    ECOG FS:1 - Symptomatic but completely ambulatory  Sclerae unicteric, EOMs intact Oropharynx clear and moist No cervical or supraclavicular adenopathy Lungs no rales or rhonchi Heart regular rate and rhythm Abd soft, nontender, positive bowel sounds MSK no focal spinal tenderness, no upper extremity lymphedema Neuro: nonfocal, well oriented, appropriate affect Breasts: The right breast is status post lumpectomy followed by radiation. There is no evidence of local recurrence. The left breast is benign. Both axillae are benign.  LAB RESULTS:  CMP     Component Value Date/Time   NA 143 02/22/2016 1100   K 3.8 02/22/2016 1100   CL 103 04/21/2015 0932   CO2 29 02/22/2016 1100   GLUCOSE 87 02/22/2016 1100   GLUCOSE 106 (H) 05/24/2006 0950  BUN 13.5 02/22/2016 1100   CREATININE 0.7 02/22/2016 1100   CALCIUM 9.3 02/22/2016 1100   PROT 7.2 02/22/2016 1100   ALBUMIN 3.7 02/22/2016 1100   AST 20 02/22/2016 1100   ALT 15 02/22/2016 1100   ALKPHOS 82 02/22/2016 1100   BILITOT 0.87 02/22/2016 1100   GFRNONAA 90.91 06/24/2009 0931   GFRAA 111 06/07/2008 0909    INo results found for: SPEP, UPEP  Lab Results  Component Value Date   WBC 4.2 12/26/2016   NEUTROABS 2.4 12/26/2016   HGB 11.7 12/26/2016   HCT 35.2 12/26/2016   MCV 86.9 12/26/2016   PLT 157 12/26/2016      Chemistry      Component Value Date/Time   NA 143 02/22/2016 1100   K 3.8 02/22/2016 1100   CL 103 04/21/2015 0932   CO2 29 02/22/2016 1100   BUN 13.5 02/22/2016 1100   CREATININE 0.7 02/22/2016 1100      Component Value Date/Time   CALCIUM 9.3 02/22/2016 1100   ALKPHOS 82 02/22/2016 1100   AST 20 02/22/2016 1100   ALT 15 02/22/2016 1100     BILITOT 0.87 02/22/2016 1100       No results found for: LABCA2  No components found for: LABCA125  No results for input(s): INR in the last 168 hours.  Urinalysis    Component Value Date/Time   COLORURINE YELLOW 04/21/2015 0932   APPEARANCEUR CLEAR 04/21/2015 0932   LABSPEC 1.010 04/21/2015 0932   PHURINE 6.0 04/21/2015 0932   GLUCOSEU NEGATIVE 04/21/2015 0932   HGBUR NEGATIVE 04/21/2015 0932   BILIRUBINUR NEGATIVE 04/21/2015 0932   KETONESUR NEGATIVE 04/21/2015 0932   UROBILINOGEN 0.2 04/21/2015 0932   NITRITE NEGATIVE 04/21/2015 0932   LEUKOCYTESUR NEGATIVE 04/21/2015 0932      ELIGIBLE FOR AVAILABLE RESEARCH PROTOCOL: no  STUDIES: Xr Hip Unilat W Or W/o Pelvis 2-3 Views Left  Result Date: 12/02/2016 AP pelvis frog leg left hip demonstrates end-stage left hip osteoarthritis with bone-on-bone changes with only mild hip narrowing. She has subchondral sclerosis 30% lateral subluxation of the femoral head marginal osteophytes and no joint space with 5 mm shortening due to erosive changes of the joint. Impression: Severe left hip primary osteoarthritis   ASSESSMENT: 66 y.o. [place] woman status post right breast upper outer quadrant biopsy 11/16/2015 for a clinical T1b N0, stage IA invasive ductal carcinoma, grade 1, estrogen receptor positive, progesterone receptor negative, HER-2 nonamplified, with an MIB-1 of 5%.  (1) status post right lumpectomy 12/22/2015 for a pT1c pN0, stage IA  invasive ductal carcinoma, grade 1, repeat HER-2 again negative  (2) Oncotype DX score of 21 predicts a risk of recurrence outside the breast over the next 10 years of 14% if the patient's only systemic therapy is tamoxifen for 5 years. It predicts a benefit from chemotherapy in the 3-4% range.  (3) adjuvant radiation 02/06/2016 through 03/05/2016 Site/dose:   The patient initially received a dose of 42.5 Gy in 17 fractions to the breast using whole-breast tangent fields. This was  delivered using a 3-D conformal technique. The patient then received a boost to the seroma. This delivered an additional 7.5 Gy in 3 fractions using an en face electron field due to the depth of the seroma. The total dose was 50 Gy.   (4) Started tamoxifen first week in October 2017  (a) bone density at [facility] healthcare 04/16/2014 found osteopenia with a T score of -1.6  (5) the patient does not meet criteria for  genetics testing    PLAN: Roiza is now a year out from definitive surgery for her breast cancer with no evidence of disease recurrence. This is favorable.  She is tolerating tamoxifen well and the plan is to continue that for a total of 5 years.  She will follow-up with her surgeon Dr. Lucia Gaskins in a few months. She will see me again a year from now. She knows to call for any problems that may develop before the next visit here. Chauncey Cruel, MD   12/26/2016 11:46 AM Medical Oncology and Hematology [facility]  [address] [place], [place] [address] Tel. [phone]    Fax. [phone]

## 2017-01-01 ENCOUNTER — Encounter (HOSPITAL_COMMUNITY): Payer: Self-pay

## 2017-01-01 ENCOUNTER — Encounter (HOSPITAL_COMMUNITY)
Admission: RE | Admit: 2017-01-01 | Discharge: 2017-01-01 | Disposition: A | Discharge status: Home / Self Care | Payer: Medicare Other | Source: Ambulatory Visit | Attending: Orthopaedic Surgery | Admitting: Orthopaedic Surgery

## 2017-01-01 DIAGNOSIS — M1612 Unilateral primary osteoarthritis, left hip: Secondary | ICD-10-CM | POA: Diagnosis not present

## 2017-01-01 DIAGNOSIS — Z01812 Encounter for preprocedural laboratory examination: Secondary | ICD-10-CM | POA: Insufficient documentation

## 2017-01-01 HISTORY — DX: Cardiac murmur, unspecified: R01.1

## 2017-01-01 HISTORY — DX: Personal history of other diseases of the digestive system: Z87.19

## 2017-01-01 HISTORY — DX: Nontraumatic subarachnoid hemorrhage, unspecified: I60.9

## 2017-01-01 LAB — COMPREHENSIVE METABOLIC PANEL
ALK PHOS: 46 U/L (ref 38–126)
ALT: 20 U/L (ref 14–54)
AST: 26 U/L (ref 15–41)
Albumin: 4 g/dL (ref 3.5–5.0)
Anion gap: 6 (ref 5–15)
BUN: 14 mg/dL (ref 6–20)
CALCIUM: 9.1 mg/dL (ref 8.9–10.3)
CHLORIDE: 105 mmol/L (ref 101–111)
CO2: 27 mmol/L (ref 22–32)
CREATININE: 0.9 mg/dL (ref 0.44–1.00)
GFR calc Af Amer: >60 mL/min (ref >60)
Glucose, Bld: 98 mg/dL (ref 65–99)
Potassium: 3.8 mmol/L (ref 3.5–5.1)
Sodium: 138 mmol/L (ref 135–145)
Total Bilirubin: 0.7 mg/dL (ref 0.3–1.2)
Total Protein: 6.6 g/dL (ref 6.5–8.1)

## 2017-01-01 LAB — CBC
HCT: 34.6 % — ABNORMAL LOW (ref 36.0–46.0)
HEMOGLOBIN: 11.4 g/dL — AB (ref 12.0–15.0)
MCH: 28.6 pg (ref 26.0–34.0)
MCHC: 32.9 g/dL (ref 30.0–36.0)
MCV: 86.9 fL (ref 78.0–100.0)
PLATELETS: 165 10*3/uL (ref 150–400)
RBC: 3.98 MIL/uL (ref 3.87–5.11)
RDW: 13.4 % (ref 11.5–15.5)
WBC: 5.7 10*3/uL (ref 4.0–10.5)

## 2017-01-01 LAB — URINALYSIS, ROUTINE W REFLEX MICROSCOPIC
BILIRUBIN URINE: NEGATIVE
Glucose, UA: NEGATIVE mg/dL
HGB URINE DIPSTICK: NEGATIVE
Ketones, ur: NEGATIVE mg/dL
Leukocytes, UA: NEGATIVE
Nitrite: NEGATIVE
PH: 6 (ref 5.0–8.0)
Protein, ur: NEGATIVE mg/dL
SPECIFIC GRAVITY, URINE: 1.013 (ref 1.005–1.030)

## 2017-01-01 LAB — SURGICAL PCR SCREEN
MRSA, PCR: NEGATIVE
Staphylococcus aureus: POSITIVE — AB

## 2017-01-01 NOTE — Pre-Procedure Instructions (Signed)
Alexis [name]  01/01/2017      [id number] - [place], [place] - [address] [place] [address] Phone: [phone] Fax: [phone]    Your procedure is scheduled on Friday, January 11, 2017.  Report to [facility] Admitting at [place].  Call this number if you have problems the morning of surgery:  [phone]   Remember:  Do not eat food or drink liquids after midnight.  Take these medicines the morning of surgery with A SIP OF WATER:  famotidine (Pepcid), loratadine (Claritin), pantoprazole (Protonix)  7 days prior to surgery STOP taking any Aspirin, Aleve, Naproxen, Ibuprofen, Motrin, Advil, Goody's, BC's, all herbal medications, fish oil, and all vitamins    Do not wear jewelry, make-up or nail polish.  Do not wear lotions, powders, or perfumes, or deodorant.  Do not shave 48 hours prior to surgery.   Do not bring valuables to the hospital.  [facility] is not responsible for any belongings or valuables.  Contacts, dentures or bridgework may not be worn into surgery.  Leave your suitcase in the car.  After surgery it may be brought to your room.  For patients admitted to the hospital, discharge time will be determined by your treatment team.  Patients discharged the day of surgery will not be allowed to drive home.   Name and phone number of your driver:    Special instructions:   [place]- Preparing For Surgery  Before surgery, you can play an important role. Because skin is not sterile, your skin needs to be as free of germs as possible. You can reduce the number of germs on your skin by washing with CHG (chlorahexidine gluconate) Soap before surgery.  CHG is an antiseptic cleaner which kills germs and bonds with the skin to continue killing germs even after washing.  Please do not use if you have an allergy to CHG or antibacterial soaps. If your skin becomes reddened/irritated stop using the CHG.  Do not  shave (including legs and underarms) for at least 48 hours prior to first CHG shower. It is OK to shave your face.  Please follow these instructions carefully.   1. Shower the NIGHT BEFORE SURGERY and the MORNING OF SURGERY with CHG.   2. If you chose to wash your hair, wash your hair first as usual with your normal shampoo.  3. After you shampoo, rinse your hair and body thoroughly to remove the shampoo.  4. Use CHG as you would any other liquid soap. You can apply CHG directly to the skin and wash gently with a scrungie or a clean washcloth.   5. Apply the CHG Soap to your body ONLY FROM THE NECK DOWN.  Do not use on open wounds or open sores. Avoid contact with your eyes, ears, mouth and genitals (private parts). Wash genitals (private parts) with your normal soap.  6. Wash thoroughly, paying special attention to the area where your surgery will be performed.  7. Thoroughly rinse your body with warm water from the neck down.  8. DO NOT shower/wash with your normal soap after using and rinsing off the CHG Soap.  9. Pat yourself dry with a CLEAN TOWEL.   10. Wear CLEAN PAJAMAS   11. Place CLEAN SHEETS on your bed the night of your first shower and DO NOT SLEEP WITH PETS.    Day of Surgery:  Shower as stated above Do not apply any  deodorants/lotions. Please wear clean clothes to the hospital/surgery center.      Please read over the following fact sheets that you were given. Pain Booklet, Coughing and Deep Breathing, MRSA Information and Surgical Site Infection Prevention

## 2017-01-01 NOTE — Progress Notes (Signed)
PCP - Dr. [name] Cardiologist - Patient Denies  Chest x-ray - n/a EKG - 04/23/2016 Stress Test - 07/06/2005 ECHO - 30+ years ago in [place] Cardiac Cath - patient denies  Sleep Study - patient denies    Patient denies shortness of breath, fever, cough and chest pain at PAT appointment   Patient verbalized understanding of instructions that were given to them at the PAT appointment. Patient was also instructed that they will need to review over the PAT instructions again at home before surgery.

## 2017-01-10 MED ORDER — CEFAZOLIN SODIUM-DEXTROSE 2-4 GM/100ML-% IV SOLN
2.0000 g | INTRAVENOUS | Status: AC
  Administered 2017-01-11: 2 g via INTRAVENOUS
  Filled 2017-01-10: qty 100

## 2017-01-10 NOTE — Anesthesia Preprocedure Evaluation (Addendum)
Anesthesia Evaluation  Patient identified by MRN, date of birth, ID band Patient awake    Reviewed: Allergy & Precautions, NPO status , Patient's Chart, lab work & pertinent test results  Airway Mallampati: I  TM Distance: >3 FB Neck ROM: Full    Dental no notable dental hx. (+) Teeth Intact   Pulmonary neg pulmonary ROS,    Pulmonary exam normal breath sounds clear to auscultation       Cardiovascular negative cardio ROS Normal cardiovascular exam Rhythm:Regular Rate:Normal     Neuro/Psych PSYCHIATRIC DISORDERS negative neurological ROS     GI/Hepatic Neg liver ROS, GERD  Controlled and Medicated,  Endo/Other  Right Breast Ca Hyperlipidemia  Renal/GU negative Renal ROS  negative genitourinary   Musculoskeletal negative musculoskeletal ROS (+)   Abdominal   Peds  Hematology  (+) anemia ,   Anesthesia Other Findings   Reproductive/Obstetrics                            Anesthesia Physical  Anesthesia Plan  ASA: III  Anesthesia Plan: Spinal   Post-op Pain Management:    Induction:   PONV Risk Score and Plan: 2 and Ondansetron, Dexamethasone, Treatment may vary due to age or medical condition, Propofol and Midazolam  Airway Management Planned: Natural Airway, Nasal Cannula and Simple Face Mask  Additional Equipment:   Intra-op Plan:   Post-operative Plan: Extubation in OR  Informed Consent: I have reviewed the patients History and Physical, chart, labs and discussed the procedure including the risks, benefits and alternatives for the proposed anesthesia with the patient or authorized representative who has indicated his/her understanding and acceptance.   Dental advisory given  Plan Discussed with: CRNA, Anesthesiologist and Surgeon  Anesthesia Plan Comments:        Anesthesia Quick Evaluation

## 2017-01-11 ENCOUNTER — Inpatient Hospital Stay (HOSPITAL_COMMUNITY): Payer: Medicare Other | Admitting: Anesthesiology

## 2017-01-11 ENCOUNTER — Inpatient Hospital Stay (HOSPITAL_COMMUNITY): Payer: Medicare Other

## 2017-01-11 ENCOUNTER — Encounter (HOSPITAL_COMMUNITY): Payer: Self-pay | Admitting: *Deleted

## 2017-01-11 ENCOUNTER — Inpatient Hospital Stay (HOSPITAL_COMMUNITY)
Admission: RE | Admit: 2017-01-11 | Discharge: 2017-01-13 | DRG: 470 | Disposition: A | Discharge status: Home / Self Care | Payer: Medicare Other | Source: Ambulatory Visit | Attending: Orthopaedic Surgery | Admitting: Orthopaedic Surgery

## 2017-01-11 ENCOUNTER — Encounter (HOSPITAL_COMMUNITY)
Admission: RE | Disposition: A | Discharge status: Home / Self Care | Payer: Self-pay | Source: Ambulatory Visit | Attending: Orthopaedic Surgery

## 2017-01-11 DIAGNOSIS — E785 Hyperlipidemia, unspecified: Secondary | ICD-10-CM | POA: Diagnosis not present

## 2017-01-11 DIAGNOSIS — I341 Nonrheumatic mitral (valve) prolapse: Secondary | ICD-10-CM | POA: Diagnosis not present

## 2017-01-11 DIAGNOSIS — M1612 Unilateral primary osteoarthritis, left hip: Secondary | ICD-10-CM | POA: Diagnosis not present

## 2017-01-11 DIAGNOSIS — Z923 Personal history of irradiation: Secondary | ICD-10-CM

## 2017-01-11 DIAGNOSIS — Z471 Aftercare following joint replacement surgery: Secondary | ICD-10-CM | POA: Diagnosis not present

## 2017-01-11 DIAGNOSIS — Z7982 Long term (current) use of aspirin: Secondary | ICD-10-CM | POA: Diagnosis not present

## 2017-01-11 DIAGNOSIS — Z170 Estrogen receptor positive status [ER+]: Secondary | ICD-10-CM

## 2017-01-11 DIAGNOSIS — K589 Irritable bowel syndrome without diarrhea: Secondary | ICD-10-CM | POA: Diagnosis present

## 2017-01-11 DIAGNOSIS — M40204 Unspecified kyphosis, thoracic region: Secondary | ICD-10-CM | POA: Diagnosis not present

## 2017-01-11 DIAGNOSIS — Z8249 Family history of ischemic heart disease and other diseases of the circulatory system: Secondary | ICD-10-CM

## 2017-01-11 DIAGNOSIS — M810 Age-related osteoporosis without current pathological fracture: Secondary | ICD-10-CM | POA: Diagnosis not present

## 2017-01-11 DIAGNOSIS — R5383 Other fatigue: Secondary | ICD-10-CM | POA: Diagnosis present

## 2017-01-11 DIAGNOSIS — Z96642 Presence of left artificial hip joint: Secondary | ICD-10-CM | POA: Diagnosis not present

## 2017-01-11 DIAGNOSIS — Z833 Family history of diabetes mellitus: Secondary | ICD-10-CM | POA: Diagnosis not present

## 2017-01-11 DIAGNOSIS — Z419 Encounter for procedure for purposes other than remedying health state, unspecified: Secondary | ICD-10-CM

## 2017-01-11 DIAGNOSIS — Z803 Family history of malignant neoplasm of breast: Secondary | ICD-10-CM | POA: Diagnosis not present

## 2017-01-11 DIAGNOSIS — Z91040 Latex allergy status: Secondary | ICD-10-CM | POA: Diagnosis not present

## 2017-01-11 DIAGNOSIS — Z91018 Allergy to other foods: Secondary | ICD-10-CM | POA: Diagnosis not present

## 2017-01-11 DIAGNOSIS — M25552 Pain in left hip: Secondary | ICD-10-CM | POA: Diagnosis not present

## 2017-01-11 DIAGNOSIS — K219 Gastro-esophageal reflux disease without esophagitis: Secondary | ICD-10-CM | POA: Diagnosis not present

## 2017-01-11 DIAGNOSIS — Z853 Personal history of malignant neoplasm of breast: Secondary | ICD-10-CM

## 2017-01-11 DIAGNOSIS — Z8349 Family history of other endocrine, nutritional and metabolic diseases: Secondary | ICD-10-CM

## 2017-01-11 DIAGNOSIS — M1611 Unilateral primary osteoarthritis, right hip: Secondary | ICD-10-CM | POA: Diagnosis not present

## 2017-01-11 DIAGNOSIS — Z09 Encounter for follow-up examination after completed treatment for conditions other than malignant neoplasm: Secondary | ICD-10-CM

## 2017-01-11 HISTORY — PX: TOTAL HIP ARTHROPLASTY: SHX124

## 2017-01-11 SURGERY — ARTHROPLASTY, HIP, TOTAL, ANTERIOR APPROACH
Anesthesia: Spinal | Laterality: Left

## 2017-01-11 MED ORDER — PANTOPRAZOLE SODIUM 40 MG PO TBEC
40.0000 mg | DELAYED_RELEASE_TABLET | Freq: Every day | ORAL | Status: DC
  Administered 2017-01-12 – 2017-01-13 (×2): 40 mg via ORAL
  Filled 2017-01-11 (×2): qty 1

## 2017-01-11 MED ORDER — FENTANYL CITRATE (PF) 250 MCG/5ML IJ SOLN
INTRAMUSCULAR | Status: AC
  Filled 2017-01-11: qty 5

## 2017-01-11 MED ORDER — BUPIVACAINE HCL (PF) 0.25 % IJ SOLN
INTRAMUSCULAR | Status: DC | PRN
  Administered 2017-01-11: 10 mL

## 2017-01-11 MED ORDER — PHENYLEPHRINE HCL 10 MG/ML IJ SOLN
INTRAVENOUS | Status: DC | PRN
  Administered 2017-01-11: 25 ug/min via INTRAVENOUS

## 2017-01-11 MED ORDER — PROPOFOL 10 MG/ML IV BOLUS
INTRAVENOUS | Status: AC
  Filled 2017-01-11: qty 20

## 2017-01-11 MED ORDER — PHENYLEPHRINE HCL 10 MG/ML IJ SOLN
INTRAMUSCULAR | Status: AC
  Filled 2017-01-11: qty 1

## 2017-01-11 MED ORDER — METHOCARBAMOL 1000 MG/10ML IJ SOLN
500.0000 mg | Freq: Four times a day (QID) | INTRAVENOUS | Status: DC | PRN
  Filled 2017-01-11: qty 5

## 2017-01-11 MED ORDER — PHENYLEPHRINE 40 MCG/ML (10ML) SYRINGE FOR IV PUSH (FOR BLOOD PRESSURE SUPPORT)
PREFILLED_SYRINGE | INTRAVENOUS | Status: DC | PRN
  Administered 2017-01-11: 40 ug via INTRAVENOUS

## 2017-01-11 MED ORDER — DEXAMETHASONE SODIUM PHOSPHATE 10 MG/ML IJ SOLN
INTRAMUSCULAR | Status: DC | PRN
  Administered 2017-01-11: 10 mg via INTRAVENOUS

## 2017-01-11 MED ORDER — BUPIVACAINE HCL (PF) 0.25 % IJ SOLN
INTRAMUSCULAR | Status: AC
  Filled 2017-01-11: qty 30

## 2017-01-11 MED ORDER — PHENOL 1.4 % MT LIQD: 1.0000 | OROMUCOSAL | Status: DC | PRN

## 2017-01-11 MED ORDER — ONDANSETRON HCL 4 MG PO TABS: 4.0000 mg | ORAL_TABLET | Freq: Four times a day (QID) | ORAL | Status: DC | PRN

## 2017-01-11 MED ORDER — ONDANSETRON HCL 4 MG/2ML IJ SOLN
INTRAMUSCULAR | Status: DC | PRN
  Administered 2017-01-11: 4 mg via INTRAVENOUS

## 2017-01-11 MED ORDER — FENTANYL CITRATE (PF) 100 MCG/2ML IJ SOLN
INTRAMUSCULAR | Status: DC | PRN
  Administered 2017-01-11: 50 ug via INTRAVENOUS

## 2017-01-11 MED ORDER — ONDANSETRON HCL 4 MG/2ML IJ SOLN: 4.0000 mg | Freq: Once | INTRAMUSCULAR | Status: DC | PRN

## 2017-01-11 MED ORDER — SODIUM CHLORIDE 0.9 % IV SOLN
INTRAVENOUS | Status: DC
  Administered 2017-01-11 – 2017-01-12 (×3): via INTRAVENOUS

## 2017-01-11 MED ORDER — LORATADINE 10 MG PO TABS
10.0000 mg | ORAL_TABLET | Freq: Every day | ORAL | Status: DC
  Administered 2017-01-12 – 2017-01-13 (×2): 10 mg via ORAL
  Filled 2017-01-11 (×2): qty 1

## 2017-01-11 MED ORDER — ACETAMINOPHEN 325 MG PO TABS
650.0000 mg | ORAL_TABLET | Freq: Four times a day (QID) | ORAL | Status: DC | PRN
  Administered 2017-01-11 – 2017-01-12 (×4): 650 mg via ORAL
  Filled 2017-01-11 (×4): qty 2

## 2017-01-11 MED ORDER — TAMOXIFEN CITRATE 10 MG PO TABS
20.0000 mg | ORAL_TABLET | Freq: Every day | ORAL | Status: DC
  Administered 2017-01-11 – 2017-01-12 (×2): 20 mg via ORAL
  Filled 2017-01-11 (×2): qty 2

## 2017-01-11 MED ORDER — MENTHOL 3 MG MT LOZG: 1.0000 | LOZENGE | OROMUCOSAL | Status: DC | PRN

## 2017-01-11 MED ORDER — MIDAZOLAM HCL 2 MG/2ML IJ SOLN
INTRAMUSCULAR | Status: AC
  Filled 2017-01-11: qty 2

## 2017-01-11 MED ORDER — ONDANSETRON HCL 4 MG/2ML IJ SOLN
4.0000 mg | Freq: Four times a day (QID) | INTRAMUSCULAR | Status: DC | PRN
  Administered 2017-01-11 – 2017-01-12 (×2): 4 mg via INTRAVENOUS
  Filled 2017-01-11 (×2): qty 2

## 2017-01-11 MED ORDER — PRAVASTATIN SODIUM 40 MG PO TABS
40.0000 mg | ORAL_TABLET | Freq: Every evening | ORAL | Status: DC
  Administered 2017-01-11 – 2017-01-12 (×2): 40 mg via ORAL
  Filled 2017-01-11 (×2): qty 1

## 2017-01-11 MED ORDER — ACETAMINOPHEN 650 MG RE SUPP: 650.0000 mg | Freq: Four times a day (QID) | RECTAL | Status: DC | PRN

## 2017-01-11 MED ORDER — PROPOFOL 500 MG/50ML IV EMUL
INTRAVENOUS | Status: DC | PRN
  Administered 2017-01-11: 75 ug/kg/min via INTRAVENOUS

## 2017-01-11 MED ORDER — DEXAMETHASONE SODIUM PHOSPHATE 10 MG/ML IJ SOLN
INTRAMUSCULAR | Status: AC
  Filled 2017-01-11: qty 1

## 2017-01-11 MED ORDER — ONDANSETRON HCL 4 MG/2ML IJ SOLN
INTRAMUSCULAR | Status: AC
  Filled 2017-01-11: qty 2

## 2017-01-11 MED ORDER — LACTATED RINGERS IV SOLN
INTRAVENOUS | Status: DC | PRN
  Administered 2017-01-11: 07:00:00 via INTRAVENOUS

## 2017-01-11 MED ORDER — BUPIVACAINE-EPINEPHRINE 0.25% -1:200000 IJ SOLN
INTRAMUSCULAR | Status: AC
  Filled 2017-01-11: qty 1

## 2017-01-11 MED ORDER — POLYETHYLENE GLYCOL 3350 17 G PO PACK: 17.0000 g | PACK | Freq: Every day | ORAL | Status: DC | PRN

## 2017-01-11 MED ORDER — BUPIVACAINE-EPINEPHRINE 0.25% -1:200000 IJ SOLN: INTRAMUSCULAR | Status: DC | PRN

## 2017-01-11 MED ORDER — PHENYLEPHRINE 40 MCG/ML (10ML) SYRINGE FOR IV PUSH (FOR BLOOD PRESSURE SUPPORT)
PREFILLED_SYRINGE | INTRAVENOUS | Status: AC
  Filled 2017-01-11: qty 10

## 2017-01-11 MED ORDER — METHOCARBAMOL 500 MG PO TABS
ORAL_TABLET | ORAL | Status: AC
  Administered 2017-01-11: 500 mg via ORAL
  Filled 2017-01-11: qty 1

## 2017-01-11 MED ORDER — HYDROMORPHONE HCL 1 MG/ML IJ SOLN
0.5000 mg | INTRAMUSCULAR | Status: DC | PRN
  Administered 2017-01-11 – 2017-01-12 (×2): 1 mg via INTRAVENOUS
  Filled 2017-01-11 (×2): qty 1

## 2017-01-11 MED ORDER — 0.9 % SODIUM CHLORIDE (POUR BTL) OPTIME
TOPICAL | Status: DC | PRN
  Administered 2017-01-11: 1000 mL

## 2017-01-11 MED ORDER — METOCLOPRAMIDE HCL 5 MG/ML IJ SOLN: 5.0000 mg | Freq: Three times a day (TID) | INTRAMUSCULAR | Status: DC | PRN

## 2017-01-11 MED ORDER — MEPERIDINE HCL 25 MG/ML IJ SOLN
6.2500 mg | INTRAMUSCULAR | Status: DC | PRN
Start: 2017-01-11 — End: 2017-01-11

## 2017-01-11 MED ORDER — FENTANYL CITRATE (PF) 100 MCG/2ML IJ SOLN
25.0000 ug | INTRAMUSCULAR | Status: DC | PRN
  Administered 2017-01-11 (×2): 50 ug via INTRAVENOUS

## 2017-01-11 MED ORDER — METOCLOPRAMIDE HCL 5 MG PO TABS: 5.0000 mg | ORAL_TABLET | Freq: Three times a day (TID) | ORAL | Status: DC | PRN

## 2017-01-11 MED ORDER — FAMOTIDINE 20 MG PO TABS
20.0000 mg | ORAL_TABLET | Freq: Every day | ORAL | Status: DC
  Administered 2017-01-11 – 2017-01-13 (×3): 20 mg via ORAL
  Filled 2017-01-11 (×3): qty 1

## 2017-01-11 MED ORDER — GLYCOPYRROLATE 0.2 MG/ML IJ SOLN
INTRAMUSCULAR | Status: DC | PRN
  Administered 2017-01-11: .2 mg via INTRAVENOUS

## 2017-01-11 MED ORDER — PROPOFOL 500 MG/50ML IV EMUL: INTRAVENOUS | Status: DC | PRN

## 2017-01-11 MED ORDER — CEFAZOLIN SODIUM-DEXTROSE 1-4 GM/50ML-% IV SOLN
1.0000 g | Freq: Three times a day (TID) | INTRAVENOUS | Status: AC
  Administered 2017-01-11 – 2017-01-12 (×2): 1 g via INTRAVENOUS
  Filled 2017-01-11 (×2): qty 50

## 2017-01-11 MED ORDER — MIDAZOLAM HCL 5 MG/5ML IJ SOLN
INTRAMUSCULAR | Status: DC | PRN
  Administered 2017-01-11: 2 mg via INTRAVENOUS

## 2017-01-11 MED ORDER — EPHEDRINE 5 MG/ML INJ
INTRAVENOUS | Status: AC
  Filled 2017-01-11: qty 10

## 2017-01-11 MED ORDER — CHLORHEXIDINE GLUCONATE 4 % EX LIQD: 60.0000 mL | Freq: Once | CUTANEOUS | Status: DC

## 2017-01-11 MED ORDER — ASPIRIN EC 325 MG PO TBEC
325.0000 mg | DELAYED_RELEASE_TABLET | Freq: Every day | ORAL | Status: DC
  Administered 2017-01-12 – 2017-01-13 (×2): 325 mg via ORAL
  Filled 2017-01-11 (×2): qty 1

## 2017-01-11 MED ORDER — METHOCARBAMOL 500 MG PO TABS
500.0000 mg | ORAL_TABLET | Freq: Four times a day (QID) | ORAL | Status: DC | PRN
  Administered 2017-01-11 – 2017-01-12 (×3): 500 mg via ORAL
  Filled 2017-01-11 (×2): qty 1

## 2017-01-11 MED ORDER — ALBUMIN HUMAN 5 % IV SOLN
INTRAVENOUS | Status: DC | PRN
  Administered 2017-01-11: 09:00:00 via INTRAVENOUS

## 2017-01-11 MED ORDER — EPHEDRINE SULFATE-NACL 50-0.9 MG/10ML-% IV SOSY
PREFILLED_SYRINGE | INTRAVENOUS | Status: DC | PRN
  Administered 2017-01-11: 5 mg via INTRAVENOUS
  Administered 2017-01-11: 10 mg via INTRAVENOUS

## 2017-01-11 MED ORDER — DOCUSATE SODIUM 100 MG PO CAPS
100.0000 mg | ORAL_CAPSULE | Freq: Two times a day (BID) | ORAL | Status: DC
  Administered 2017-01-11 – 2017-01-13 (×4): 100 mg via ORAL
  Filled 2017-01-11 (×4): qty 1

## 2017-01-11 MED ORDER — FENTANYL CITRATE (PF) 100 MCG/2ML IJ SOLN
INTRAMUSCULAR | Status: AC
  Administered 2017-01-11: 50 ug via INTRAVENOUS
  Filled 2017-01-11: qty 2

## 2017-01-11 MED ORDER — OXYCODONE HCL 5 MG PO TABS
5.0000 mg | ORAL_TABLET | ORAL | Status: DC | PRN
  Filled 2017-01-11 (×2): qty 2

## 2017-01-11 SURGICAL SUPPLY — 52 items
APL SKNCLS STERI-STRIP NONHPOA (GAUZE/BANDAGES/DRESSINGS) ×1
BENZOIN TINCTURE PRP APPL 2/3 (GAUZE/BANDAGES/DRESSINGS) ×3 IMPLANT
BLADE CLIPPER SURG (BLADE) IMPLANT
BLADE SAW SGTL 18X1.27X75 (BLADE) ×2 IMPLANT
BLADE SAW SGTL 18X1.27X75MM (BLADE) ×1
CAPT HIP TOTAL 2 ×3 IMPLANT
CELLS DAT CNTRL 66122 CELL SVR (MISCELLANEOUS) ×1 IMPLANT
CLOSURE WOUND 1/2 X4 (GAUZE/BANDAGES/DRESSINGS) ×1
COVER SURGICAL LIGHT HANDLE (MISCELLANEOUS) ×3 IMPLANT
DRAPE C-ARM 42X72 X-RAY (DRAPES) ×3 IMPLANT
DRAPE IMP U-DRAPE 54X76 (DRAPES) ×3 IMPLANT
DRAPE STERI IOBAN 125X83 (DRAPES) ×3 IMPLANT
DRAPE U-SHAPE 47X51 STRL (DRAPES) ×9 IMPLANT
DRSG MEPILEX BORDER 4X12 (GAUZE/BANDAGES/DRESSINGS) ×3 IMPLANT
DRSG MEPILEX BORDER 4X8 (GAUZE/BANDAGES/DRESSINGS) IMPLANT
DURAPREP 26ML APPLICATOR (WOUND CARE) ×3 IMPLANT
ELECT BLADE 4.0 EZ CLEAN MEGAD (MISCELLANEOUS) ×3
ELECT CAUTERY BLADE 6.4 (BLADE) ×3 IMPLANT
ELECT REM PT RETURN 9FT ADLT (ELECTROSURGICAL) ×3
ELECTRODE BLDE 4.0 EZ CLN MEGD (MISCELLANEOUS) ×1 IMPLANT
ELECTRODE REM PT RTRN 9FT ADLT (ELECTROSURGICAL) ×1 IMPLANT
FACESHIELD WRAPAROUND (MASK) ×6 IMPLANT
GLOVE BIOGEL PI IND STRL 8 (GLOVE) ×2 IMPLANT
GLOVE BIOGEL PI INDICATOR 8 (GLOVE) ×4
GLOVE ORTHO TXT STRL SZ7.5 (GLOVE) IMPLANT
GLOVE SURG SS PI 8.0 STRL IVOR (GLOVE) ×9 IMPLANT
GOWN STRL REUS W/ TWL LRG LVL3 (GOWN DISPOSABLE) ×1 IMPLANT
GOWN STRL REUS W/ TWL XL LVL3 (GOWN DISPOSABLE) ×1 IMPLANT
GOWN STRL REUS W/TWL 2XL LVL3 (GOWN DISPOSABLE) ×3 IMPLANT
GOWN STRL REUS W/TWL LRG LVL3 (GOWN DISPOSABLE) ×3
GOWN STRL REUS W/TWL XL LVL3 (GOWN DISPOSABLE) ×3
KIT BASIN OR (CUSTOM PROCEDURE TRAY) ×3 IMPLANT
KIT ROOM TURNOVER OR (KITS) ×3 IMPLANT
MANIFOLD NEPTUNE II (INSTRUMENTS) ×3 IMPLANT
NS IRRIG 1000ML POUR BTL (IV SOLUTION) ×3 IMPLANT
PACK TOTAL JOINT (CUSTOM PROCEDURE TRAY) ×3 IMPLANT
PACK UNIVERSAL I (CUSTOM PROCEDURE TRAY) ×3 IMPLANT
PAD ARMBOARD 7.5X6 YLW CONV (MISCELLANEOUS) ×6 IMPLANT
RTRCTR WOUND ALEXIS 18CM MED (MISCELLANEOUS) ×3
STAPLER VISISTAT 35W (STAPLE) ×3 IMPLANT
STRIP CLOSURE SKIN 1/2X4 (GAUZE/BANDAGES/DRESSINGS) ×2 IMPLANT
SUT VIC AB 0 CT1 27 (SUTURE) ×3
SUT VIC AB 0 CT1 27XBRD ANBCTR (SUTURE) ×1 IMPLANT
SUT VIC AB 2-0 CT1 27 (SUTURE) ×3
SUT VIC AB 2-0 CT1 TAPERPNT 27 (SUTURE) ×1 IMPLANT
SUT VICRYL 4-0 PS2 18IN ABS (SUTURE) ×3 IMPLANT
SUT VLOC 180 0 24IN GS25 (SUTURE) ×3 IMPLANT
TOWEL OR 17X24 6PK STRL BLUE (TOWEL DISPOSABLE) ×3 IMPLANT
TOWEL OR 17X26 10 PK STRL BLUE (TOWEL DISPOSABLE) ×6 IMPLANT
TRAY CATH 16FR W/PLASTIC CATH (SET/KITS/TRAYS/PACK) IMPLANT
TRAY FOLEY W/METER SILVER 16FR (SET/KITS/TRAYS/PACK) ×3 IMPLANT
WATER STERILE IRR 1000ML POUR (IV SOLUTION) ×6 IMPLANT

## 2017-01-11 NOTE — Brief Op Note (Signed)
01/11/2017  10:17 AM  PATIENT:  Alexis [name]  66 y.o. female  PRE-OPERATIVE DIAGNOSIS:  Left Hip Osteoarthritis  POST-OPERATIVE DIAGNOSIS:  Left Hip Osteoarthritis  PROCEDURE:  Procedure(s): LEFT TOTAL HIP ARTHROPLASTY ANTERIOR APPROACH (Left)  SURGEON:  Surgeon(s) and Role:    * [name], MD - Primary  PHYSICIAN ASSISTANT: Benjiman Core    ANESTHESIA:   spinal  EBL:  Total I/O In: 1450 [I.V.:1200; IV Piggyback:250] Out: 550 [Urine:250; Blood:300]  BLOOD ADMINISTERED:none  DRAINS: none   LOCAL MEDICATIONS USED:  MARCAINE     SPECIMEN:  No Specimen  DISPOSITION OF SPECIMEN:  N/A  COUNTS:  YES  TOURNIQUET:  * No tourniquets in log *  DICTATION: .Dragon Dictation  PLAN OF CARE: Admit to inpatient   PATIENT DISPOSITION:  PACU - hemodynamically stable.

## 2017-01-11 NOTE — Op Note (Signed)
Preop diagnosis: Left hip osteoarthritis, primary.  Postop diagnosis: Primary left hip osteoarthritis  Procedure: Left total hip arthroplasty, direct anterior approach  Surgeon: Rodell Perna M.D.  Asst. Benjiman Core PA-C medically necessary and present for the entire procedure  Anesthesia: Spinal plus Marcaine local without epinephrine  EBL: 300 mL  Implants:Depuy corail #10 stem. 54 mmGripton cup with +4 neutral liner 1.5 mm neck length and a ceramic ball  Procedure: After standard prepping and draping after Foley insertion placement on the Hanna table C-arm was brought in after two 10/15 drapes were applied. Prepped with DuraPrep. Patient was slightly short due to erosive where of the acetabulum and dermal head. Usual hip sheets drapes hydraulic side arm large shower curtain Betadine Steri-Drape was applied. Half sheet above. She across. Timeout procedure. Sterile skin marker been used and Ancef was given prophylactically. Patient is latex allergic. Direct anterior approach was made obliquely starting 2 cm lateral and 2 cm inferior to the ASIS obliquely across the trochanter fascia was cleaned skin protector applied. Fascia was neck elevated medially and a dull Cobra was placed over the top the capsule. Artery was coagulated small amount of fat was removed capsule was opened there was a gush of clear synovial fluid present anterior capsule was resected. Neck was cut under x-ray initially a little bit long and then resected a few more millimeters where the patient had an 11 mm neck length. Sequential reaming up to a 53 size and 54 mm cup was placed with some difficulty having take the cup off the handle inserter putting the cup and by hand and then screwing it in and packing it under fluoroscopy in appropriate abduction and cup flexion. There were 3 dome holes available however there was excellent rim fit cup was tight well-seated and no screws were needed. +4 neutral polyliner was then impacted in  place after was dialed appropriately and inserted. Posterior capsule was resected partially. Curette was used Ronjair lateralizing into the trochanter. Large long Hohmann clamp was placed around the trochanter and medial retractor medially. Sequential preparation of the canal with cookie-cutter, chili pepper, sequential broaching up to a 10 which gave excellent tight fit. By x-ray patient still appeared a few millimeters short but was tight it was difficult to reduce the hip and with 11 mm neck and excellent position of of the acetabulum at the teardrop she had a normal-appearing hip and I felt that it lengthening or further would make her too tight. She was stable in extension of 45 90 external rotation and had no shuck with traction longitudinally through the Hohn a table with the boot. Fluoroscopic pictures were taken AP and lateral the femur trochanter was intact stem was excellent fill and fit permanent stem was placed ceramic ball applied impacted hip was reduced again with some difficulty with the good leg lengths on fluoroscopy. Operative field was dry irrigation the lock closure of the fascia subtendinous tissue subarticular closure postop dressing Alexis [name] infiltration prior to closure and then transferred recovery room.

## 2017-01-11 NOTE — Anesthesia Postprocedure Evaluation (Signed)
Anesthesia Post Note  Patient: DAHLIA NIFONG  Procedure(s) Performed: Procedure(s) (LRB): LEFT TOTAL HIP ARTHROPLASTY ANTERIOR APPROACH (Left)     Patient location during evaluation: PACU Anesthesia Type: Spinal Level of consciousness: oriented and awake and alert Pain management: pain level controlled Vital Signs Assessment: post-procedure vital signs reviewed and stable Respiratory status: spontaneous breathing, respiratory function stable and patient connected to nasal cannula oxygen Cardiovascular status: blood pressure returned to baseline and stable Postop Assessment: no headache and no backache Anesthetic complications: no    Last Vitals:  Vitals:   01/11/17 1215 01/11/17 1230  BP: 102/73 (!) 113/97  Pulse: (!) 54 67  Resp: 12 19  Temp:      Last Pain:  Vitals:   01/11/17 1215  TempSrc:   PainSc: 0-No pain                 Teshaun Olarte

## 2017-01-11 NOTE — Interval H&P Note (Signed)
History and Physical Interval Note:  01/11/2017 7:30 AM  Alexis [name]  has presented today for surgery, with the diagnosis of Left Hip Osteoarthritis  The various methods of treatment have been discussed with the patient and family. After consideration of risks, benefits and other options for treatment, the patient has consented to  Procedure(s): LEFT TOTAL HIP ARTHROPLASTY ANTERIOR APPROACH (Left) as a surgical intervention .  The patient's history has been reviewed, patient examined, no change in status, stable for surgery.  I have reviewed the patient's chart and labs.  Questions were answered to the patient's satisfaction.     [name]

## 2017-01-11 NOTE — Anesthesia Procedure Notes (Signed)
Procedure Name: MAC Date/Time: 01/11/2017 7:43 AM Performed by: Kyung Rudd Pre-anesthesia Checklist: Patient identified, Emergency Drugs available, Suction available and Patient being monitored Patient Re-evaluated:Patient Re-evaluated prior to induction Oxygen Delivery Method: Simple face mask Induction Type: IV induction Placement Confirmation: positive ETCO2 Dental Injury: Teeth and Oropharynx as per pre-operative assessment

## 2017-01-11 NOTE — Transfer of Care (Signed)
Immediate Anesthesia Transfer of Care Note  Patient: Alexis [name]  Procedure(s) Performed: Procedure(s): LEFT TOTAL HIP ARTHROPLASTY ANTERIOR APPROACH (Left)  Patient Location: PACU  Anesthesia Type:Spinal  Level of Consciousness: awake, alert  and oriented  Airway & Oxygen Therapy: Patient Spontanous Breathing and Patient connected to face mask oxygen  Post-op Assessment: Report given to RN and Post -op Vital signs reviewed and stable  Post vital signs: Reviewed and stable  Last Vitals:  Vitals:   01/11/17 0552  BP: (!) 159/51  Pulse: (!) 53  Resp: 20  Temp: 36.7 C    Last Pain:  Vitals:   01/11/17 0554  TempSrc:   PainSc: 4       Patients Stated Pain Goal: 5 (19/01/22 2411)  Complications: No apparent anesthesia complications

## 2017-01-11 NOTE — H&P (Signed)
TOTAL HIP ADMISSION H&P  Patient is admitted for left total hip arthroplasty.  Subjective:  Chief Complaint: left hip pain  HPI: Alexis [name], 66 y.o. female, has a history of pain and functional disability in the left hip(s) due to arthritis and patient has failed non-surgical conservative treatments for greater than 12 weeks to include NSAID's and/or analgesics, use of assistive devices, weight reduction as appropriate and activity modification.  Onset of symptoms was gradual starting 10 years ago with gradually worsening course since that time. Patient currently rates pain in the left hip at 10 out of 10 with activity. Patient has worsening of pain with activity and weight bearing, trendelenberg gait, pain that interfers with activities of daily living, pain with passive range of motion and crepitus. Patient has evidence of subchondral sclerosis, periarticular osteophytes and joint space narrowing by imaging studies. This condition presents safety issues increasing the risk of falls.  There is no current active infection.  Patient Active Problem List   Diagnosis Date Noted  . Unilateral primary osteoarthritis, left hip 12/02/2016  . Malignant neoplasm of upper-outer quadrant of right breast in female, estrogen receptor positive ([place]) 11/17/2015  . Breast mass 11/15/2015  . Radicular pain of left lower extremity 10/28/2015  . Microcytic anemia 04/22/2015  . Health care maintenance 03/25/2013  . Arthralgia 07/27/2011  . Premature atrial contractions 06/24/2009  . DIZZINESS OR VERTIGO 05/30/2007  . HEMANGIOMA 05/02/2007  . Hyperlipidemia LDL goal <160 05/02/2007  . [place]  h/o 2003 05/02/2007  . GERD 05/02/2007  . IRRITABLE BOWEL SYNDROME 05/02/2007  . Osteoporosis 05/02/2007  . 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 FATIGUE, CHRONIC 05/02/2007  . COUGH 05/02/2007   Past Medical History:  Diagnosis Date  . Breast cancer ([place]) 11/16/2015   Right Breast  . Chronic fatigue   .  Cough   . GERD (gastroesophageal reflux disease)   . Heart murmur    "years ago, when I was young"  . Hemangioma   . History of hiatal hernia   . Hyperlipidemia   . IBS (irritable bowel syndrome)   . Iron deficiency anemia   . Kyphosis of thoracic region   . MVP (mitral valve prolapse)   . Osteopenia   . SAH (subarachnoid hemorrhage) ([place])   . Vertigo     Past Surgical History:  Procedure Laterality Date  . APPENDECTOMY    . BREAST BIOPSY Right 11/16/2015   U/S Core- Malignant  . BREAST BIOPSY Left 06/21/2005   Stereo- Benign  . BREAST EXCISIONAL BIOPSY Left 2007  . BREAST LUMPECTOMY Right 12/22/2015  . BREAST LUMPECTOMY WITH RADIOACTIVE SEED AND SENTINEL LYMPH NODE BIOPSY Right 12/22/2015   Procedure: BREAST LUMPECTOMY WITH RADIOACTIVE SEED AND SENTINEL LYMPH NODE BIOPSY;  Surgeon: Alphonsa Overall, MD;  Location: [place];  Service: General;  Laterality: Right;  . FOOT SURGERY Right    bunionectomy  . [facility]  2003   s/p embolization per Dr. Estanislado Pandy  . TONSILLECTOMY    . TUBAL LIGATION      Prescriptions Prior to Admission  Medication Sig Dispense Refill Last Dose  . aspirin 81 MG tablet Take 81 mg by mouth daily.    Past Month at Unknown time  . B Complex-C (SUPER B COMPLEX/VITAMIN C PO) Take 1 tablet by mouth daily.   Past Month at Unknown time  . Calcium Carbonate-Vitamin D (CALCIUM 600/VITAMIN D PO) Take 1 tablet by mouth daily.   Past Month at Unknown time  . famotidine (PEPCID) 20  MG tablet Take 20 mg by mouth daily.   Past Month at Unknown time  . loratadine (CLARITIN) 10 MG tablet Take 10 mg by mouth daily.   Past Month at Unknown time  . naproxen sodium (ANAPROX) 220 MG tablet Take 220 mg by mouth 2 (two) times daily with a meal.   Past Month at Unknown time  . pantoprazole (PROTONIX) 40 MG tablet TAKE 1 BY MOUTH DAILY (Patient taking differently: Take 40 mg by mouth daily. ) 90 tablet 1 01/11/2017 at 0515  . pravastatin (PRAVACHOL) 40 MG tablet TAKE 1 BY  MOUTH EVERY EVENING (Patient taking differently: Take 40 mg by mouth every evening. ) 90 tablet 1 01/10/2017 at Unknown time  . tamoxifen (NOLVADEX) 20 MG tablet Take 1 tablet (20 mg total) by mouth daily. (Patient taking differently: Take 20 mg by mouth at bedtime. ) 90 tablet 4 01/10/2017 at Unknown time  . clindamycin (CLEOCIN) 300 MG capsule Take 600 mg by mouth See admin instructions. Takes 2 capsules 1 hour prior to dental procedures   More than a month at Unknown time   Allergies  Allergen Reactions  . Other Itching    Jell-o  . Latex Rash    Contact rash    Social History  Substance Use Topics  . Smoking status: Never Smoker  . Smokeless tobacco: Never Used  . Alcohol use No    Family History  Problem Relation Age of Onset  . Hyperlipidemia Mother   . Breast cancer Mother 6  . Heart attack Father   . Diabetes Brother   . Breast cancer Maternal Aunt   . Breast cancer Paternal Aunt 51     Review of Systems  Constitutional: Negative.   HENT: Negative.   Eyes: Negative.   Respiratory: Negative.   Cardiovascular: Negative.   Gastrointestinal: Negative.   Genitourinary: Negative.   Musculoskeletal: Positive for joint pain.  Skin: Negative.   Neurological: Negative.   Psychiatric/Behavioral: Negative.     Objective:  Physical Exam  Constitutional: She is oriented to person, place, and time. No distress.  HENT:  Head: Normocephalic and atraumatic.  Eyes: Pupils are equal, round, and reactive to light. EOM are normal.  Neck: Normal range of motion.  Respiratory: Effort normal. No respiratory distress.  GI: She exhibits no distension.  Musculoskeletal: She exhibits tenderness.  Neurological: She is alert and oriented to person, place, and time.  Skin: Skin is warm and dry.  Psychiatric: She has a normal mood and affect.    Vital signs in last 24 hours: Temp:  [98 F (36.7 C)] 98 F (36.7 C) (07/27 0552) Pulse Rate:  [53] 53 (07/27 0552) Resp:  [20] 20 (07/27  0552) BP: (159)/(51) 159/51 (07/27 0552) SpO2:  [99 %] 99 % (07/27 0552)  Labs:   Estimated body mass index is 19.5 kg/m as calculated from the following:   Height as of 12/26/16: 5\' 5"  (1.651 m).   Weight as of 12/26/16: 117 lb 3.2 oz (53.2 kg).   Imaging Review Plain radiographs demonstrate moderate degenerative joint disease of the left hip(s). The bone quality appears to be good for age and reported activity level.  Assessment/Plan:  End stage arthritis, left hip(s)  The patient history, physical examination, clinical judgement of the provider and imaging studies are consistent with end stage degenerative joint disease of the left hip(s) and total hip arthroplasty is deemed medically necessary. The treatment options including medical management, injection therapy, arthroscopy and arthroplasty were discussed at length.  The risks and benefits of total hip arthroplasty were presented and reviewed. The risks due to aseptic loosening, infection, stiffness, dislocation/subluxation,  thromboembolic complications and other imponderables were discussed.  The patient acknowledged the explanation, agreed to proceed with the plan and consent was signed. Patient is being admitted for inpatient treatment for surgery, pain control, PT, OT, prophylactic antibiotics, VTE prophylaxis, progressive ambulation and ADL's and discharge planning.The patient is planning to be discharged home with home health services

## 2017-01-12 LAB — CBC
HCT: 26.9 % — ABNORMAL LOW (ref 36.0–46.0)
HEMOGLOBIN: 9 g/dL — AB (ref 12.0–15.0)
MCH: 28.4 pg (ref 26.0–34.0)
MCHC: 33.5 g/dL (ref 30.0–36.0)
MCV: 84.9 fL (ref 78.0–100.0)
Platelets: 180 10*3/uL (ref 150–400)
RBC: 3.17 MIL/uL — AB (ref 3.87–5.11)
RDW: 12.8 % (ref 11.5–15.5)
WBC: 9.2 10*3/uL (ref 4.0–10.5)

## 2017-01-12 LAB — BASIC METABOLIC PANEL
Anion gap: 4 — ABNORMAL LOW (ref 5–15)
BUN: 5 mg/dL — AB (ref 6–20)
CHLORIDE: 110 mmol/L (ref 101–111)
CO2: 24 mmol/L (ref 22–32)
Calcium: 7.3 mg/dL — ABNORMAL LOW (ref 8.9–10.3)
Creatinine, Ser: 0.49 mg/dL (ref 0.44–1.00)
GFR calc Af Amer: >60 mL/min (ref >60)
GFR calc non Af Amer: >60 mL/min (ref >60)
Glucose, Bld: 118 mg/dL — ABNORMAL HIGH (ref 65–99)
POTASSIUM: 3.2 mmol/L — AB (ref 3.5–5.1)
SODIUM: 138 mmol/L (ref 135–145)

## 2017-01-12 NOTE — Evaluation (Signed)
Physical Therapy Evaluation Patient Details Name: Alexis [name] MRN: [id number] DOB: 05-21-1951 Today's Date: 01/12/2017   History of Present Illness  Pt is a 66 yo female admitted on 01/11/17 for elective L THA. PMH significant for breast CA, microcytic anemia, arthralgia, vertigo, HLD, GERD, IBS, kyphoscoliosis.   Clinical Impression  Pt presents with the above diagnosis and below deficits for therapy evaluation. Prior to admission, pt lived with her husband in a multi-level home and was completely independent and very active. Pt requires Min guard for all mobility this session and is limited mostly due to dizziness and nausea. Pt will benefit from continued acute PT follow-up in order to address the below deficits prior to DC.     Follow Up Recommendations DC plan and follow up therapy as arranged by surgeon    Equipment Recommendations  Rolling walker with 5" wheels;3in1 (PT)    Recommendations for Other Services       Precautions / Restrictions Precautions Precautions: Fall Restrictions Weight Bearing Restrictions: Yes LLE Weight Bearing: Weight bearing as tolerated      Mobility  Bed Mobility Overal bed mobility: Modified Independent             General bed mobility comments: able to get EOB without hand assist with increased time and use of rails  Transfers Overall transfer level: Needs assistance Equipment used: Rolling walker (2 wheeled) Transfers: Sit to/from Stand Sit to Stand: Min guard         General transfer comment: Min gaurd for safety from EOB and commode  Ambulation/Gait Ambulation/Gait assistance: Min guard Ambulation Distance (Feet): 50 Feet Assistive device: Rolling walker (2 wheeled) Gait Pattern/deviations: Step-to pattern;Decreased step length - right;Decreased stance time - left;Antalgic Gait velocity: decreased Gait velocity interpretation: Below normal speed for age/gender General Gait Details: Moderate antalgic gait with increased  c/o dizziness this session. Pt performed gait from bed to bathroom and back to recliner.   Stairs            Wheelchair Mobility    Modified Rankin (Stroke Patients Only)       Balance Overall balance assessment: Needs assistance Sitting-balance support: No upper extremity supported;Feet supported Sitting balance-Leahy Scale: Good     Standing balance support: Bilateral upper extremity supported Standing balance-Leahy Scale: Poor Standing balance comment: reliant on UE support in standing                             Pertinent Vitals/Pain Pain Assessment: 0-10 Pain Score: 5  Pain Location: left hip Pain Descriptors / Indicators: Aching;Grimacing;Guarding Pain Intervention(s): Monitored during session;Premedicated before session;Repositioned;Ice applied    Home Living Family/patient expects to be discharged to:: Private residence Living Arrangements: Spouse/significant other Available Help at Discharge: Family;Available 24 hours/day Type of Home: House Home Access: Stairs to enter Entrance Stairs-Rails: None Entrance Stairs-Number of Steps: 3-4 Home Layout: Multi-level Home Equipment: Walker - 2 wheels;Shower seat      Prior Function Level of Independence: Independent         Comments: was completely independent, driving and is retired since december 2017     Hand Dominance   Dominant Hand: Right    Extremity/Trunk Assessment   Upper Extremity Assessment Upper Extremity Assessment: Overall WFL for tasks assessed    Lower Extremity Assessment Lower Extremity Assessment: LLE deficits/detail LLE Deficits / Details: pt with normal post op pain and weakenss. At least 3/5 grossly LLE w    Cervical / Trunk  Assessment Cervical / Trunk Assessment: Kyphotic  Communication   Communication: No difficulties  Cognition Arousal/Alertness: Awake/alert Behavior During Therapy: WFL for tasks assessed/performed Overall Cognitive Status: Within  Functional Limits for tasks assessed                                        General Comments      Exercises Total Joint Exercises Ankle Circles/Pumps: AROM;Both;20 reps   Assessment/Plan    PT Assessment Patient needs continued PT services  PT Problem List Decreased strength;Decreased range of motion;Decreased activity tolerance;Decreased balance;Decreased mobility;Decreased knowledge of use of DME;Pain       PT Treatment Interventions DME instruction;Gait training;Stair training;Functional mobility training;Therapeutic activities;Therapeutic exercise;Balance training;Patient/family education    PT Goals (Current goals can be found in the Care Plan section)  Acute Rehab PT Goals Patient Stated Goal: to return home PT Goal Formulation: With patient/family Time For Goal Achievement: 01/19/17 Potential to Achieve Goals: Good    Frequency 7X/week   Barriers to discharge        Co-evaluation               AM-PAC PT "6 Clicks" Daily Activity  Outcome Measure Difficulty turning over in bed (including adjusting bedclothes, sheets and blankets)?: None Difficulty moving from lying on back to sitting on the side of the bed? : None Difficulty sitting down on and standing up from a chair with arms (e.g., wheelchair, bedside commode, etc,.)?: Total Help needed moving to and from a bed to chair (including a wheelchair)?: A Little Help needed walking in hospital room?: A Little Help needed climbing 3-5 steps with a railing? : A Lot 6 Click Score: 17    End of Session Equipment Utilized During Treatment: Gait belt Activity Tolerance: Patient tolerated treatment well Patient left: in chair;with call bell/phone within reach;with family/visitor present Nurse Communication: Mobility status PT Visit Diagnosis: Muscle weakness (generalized) (M62.81);Difficulty in walking, not elsewhere classified (R26.2);Pain Pain - Right/Left: Left Pain - part of body: Hip     Time: 4888-9169 PT Time Calculation (min) (ACUTE ONLY): 32 min   Charges:   PT Evaluation $PT Eval Moderate Complexity: 1 Procedure PT Treatments $Gait Training: 8-22 mins   PT G Codes:        Scheryl Marten PT, DPT  [phone]   Shanon Rosser 01/12/2017, 10:23 AM

## 2017-01-12 NOTE — Progress Notes (Signed)
Physical Therapy Treatment Patient Details Name: Alexis [name] MRN: [id number] DOB: 1951-05-29 Today's Date: 01/12/2017    History of Present Illness Pt is a 66 yo female admitted on 01/11/17 for elective L THA. PMH significant for breast CA, microcytic anemia, arthralgia, vertigo, HLD, GERD, IBS, kyphoscoliosis.     PT Comments    Patient is progressing well toward mobility goals and with no c/o dizziness and nausea. Pt tolerated increased gait, supine therex, and stair training. Continue to progress as tolerated.    Follow Up Recommendations  DC plan and follow up therapy as arranged by surgeon     Equipment Recommendations  Rolling walker with 5" wheels;3in1 (PT)    Recommendations for Other Services       Precautions / Restrictions Precautions Precautions: Fall Restrictions Weight Bearing Restrictions: Yes LLE Weight Bearing: Weight bearing as tolerated    Mobility  Bed Mobility Overal bed mobility: Modified Independent             General bed mobility comments: increased time and effort  Transfers Overall transfer level: Needs assistance Equipment used: Rolling walker (2 wheeled) Transfers: Sit to/from Stand Sit to Stand: Min guard         General transfer comment: Min gaurd for safety from EOB and commode  Ambulation/Gait Ambulation/Gait assistance: Min guard Ambulation Distance (Feet): 150 Feet Assistive device: Rolling walker (2 wheeled) Gait Pattern/deviations: Decreased stance time - left;Antalgic;Step-through pattern;Decreased step length - right;Decreased weight shift to left Gait velocity: decreased   General Gait Details: cues for posture and L heel strike   Stairs Stairs: Yes   Stair Management: No rails;Backwards;With walker;One rail Left;Step to pattern;Sideways Number of Stairs:  (2 steps X2) General stair comments: cues for sequencing and technique  Wheelchair Mobility    Modified Rankin (Stroke Patients Only)       Balance  Overall balance assessment: Needs assistance Sitting-balance support: No upper extremity supported;Feet supported Sitting balance-Leahy Scale: Good       Standing balance-Leahy Scale: Fair Standing balance comment: pt able to static stand at sink without UE support                            Cognition Arousal/Alertness: Awake/alert Behavior During Therapy: WFL for tasks assessed/performed Overall Cognitive Status: Within Functional Limits for tasks assessed                                        Exercises Total Joint Exercises Quad Sets: AROM;Both;10 reps Short Arc Quad: AROM;Left;10 reps Heel Slides: AROM;Left;10 reps Hip ABduction/ADduction: AROM;Left;10 reps    General Comments        Pertinent Vitals/Pain Pain Assessment: Faces Faces Pain Scale: Hurts little more Pain Location: lower back and L hip Pain Descriptors / Indicators: Guarding;Sore Pain Intervention(s): Limited activity within patient's tolerance;Monitored during session;Premedicated before session;Repositioned    Home Living                      Prior Function            PT Goals (current goals can now be found in the care plan section) Acute Rehab PT Goals Patient Stated Goal: to return home Progress towards PT goals: Progressing toward goals    Frequency    7X/week      PT Plan Current plan remains appropriate  Co-evaluation              AM-PAC PT "6 Clicks" Daily Activity  Outcome Measure  Difficulty turning over in bed (including adjusting bedclothes, sheets and blankets)?: None Difficulty moving from lying on back to sitting on the side of the bed? : None Difficulty sitting down on and standing up from a chair with arms (e.g., wheelchair, bedside commode, etc,.)?: A Lot Help needed moving to and from a bed to chair (including a wheelchair)?: A Little Help needed walking in hospital room?: A Little Help needed climbing 3-5 steps with a  railing? : A Little 6 Click Score: 19    End of Session Equipment Utilized During Treatment: Gait belt Activity Tolerance: Patient tolerated treatment well Patient left: in chair;with call bell/phone within reach;with family/visitor present Nurse Communication: Mobility status PT Visit Diagnosis: Muscle weakness (generalized) (M62.81);Difficulty in walking, not elsewhere classified (R26.2);Pain Pain - Right/Left: Left Pain - part of body: Hip     Time: 9480-1655 PT Time Calculation (min) (ACUTE ONLY): 26 min  Charges:  $Gait Training: 8-22 mins $Therapeutic Exercise: 8-22 mins                    G Codes:       Earney Navy, PTA Pager: [phone]     Darliss Cheney 01/12/2017, 4:44 PM

## 2017-01-12 NOTE — Progress Notes (Signed)
Subjective: Patient stable.  Reports pain in left hip and difficulty ambulating.   Objective: Vital signs in last 24 hours: Temp:  [96.8 F (36 C)-98.4 F (36.9 C)] 98.4 F (36.9 C) (07/28 0634) Pulse Rate:  [40-87] 83 (07/28 0634) Resp:  [11-25] 16 (07/28 0634) BP: (98-143)/(44-97) 128/47 (07/28 0634) SpO2:  [93 %-100 %] 97 % (07/28 0634) Weight:  [111 lb (50.3 kg)] 111 lb (50.3 kg) (07/27 0954)  Intake/Output from previous day: 07/27 0701 - 07/28 0700 In: 2801.3 [P.O.:270; I.V.:2231.3; IV Piggyback:300] Out: 2450 [Urine:2150; Blood:300] Intake/Output this shift: No intake/output data recorded.  Exam:  Dorsiflexion/Plantar flexion intact  Labs:  Recent Labs  01/12/17 0359  HGB 9.0*    Recent Labs  01/12/17 0359  WBC 9.2  RBC 3.17*  HCT 26.9*  PLT 180    Recent Labs  01/12/17 0359  NA 138  K 3.2*  CL 110  CO2 24  BUN 5*  CREATININE 0.49  GLUCOSE 118*  CALCIUM 7.3*   No results for input(s): LABPT, INR in the last 72 hours.  Assessment/Plan: Plan is to mobilize today with physical therapy with possible discharge tomorrow or Monday.  She seems to be in a fair amount of pain today so Monday is looking more likely   [employer] 01/12/2017, 8:01 AM

## 2017-01-13 LAB — BASIC METABOLIC PANEL
Anion gap: 4 — ABNORMAL LOW (ref 5–15)
BUN: 5 mg/dL — ABNORMAL LOW (ref 6–20)
CALCIUM: 8.1 mg/dL — AB (ref 8.9–10.3)
CO2: 29 mmol/L (ref 22–32)
CREATININE: 0.55 mg/dL (ref 0.44–1.00)
Chloride: 106 mmol/L (ref 101–111)
GFR calc non Af Amer: >60 mL/min (ref >60)
Glucose, Bld: 107 mg/dL — ABNORMAL HIGH (ref 65–99)
Potassium: 3.4 mmol/L — ABNORMAL LOW (ref 3.5–5.1)
SODIUM: 139 mmol/L (ref 135–145)

## 2017-01-13 LAB — CBC
HCT: 24.6 % — ABNORMAL LOW (ref 36.0–46.0)
Hemoglobin: 8.2 g/dL — ABNORMAL LOW (ref 12.0–15.0)
MCH: 28.6 pg (ref 26.0–34.0)
MCHC: 33.3 g/dL (ref 30.0–36.0)
MCV: 85.7 fL (ref 78.0–100.0)
PLATELETS: 116 10*3/uL — AB (ref 150–400)
RBC: 2.87 MIL/uL — AB (ref 3.87–5.11)
RDW: 13.3 % (ref 11.5–15.5)
WBC: 5.5 10*3/uL (ref 4.0–10.5)

## 2017-01-13 NOTE — Progress Notes (Signed)
Removed IV, discharge education provided, all questions and concerns addressed, patient not in distress, discharged to home accompanied by husband.

## 2017-01-13 NOTE — Progress Notes (Signed)
Subjective: Pt stable - pain ok - ready for dc to home   Objective: Vital signs in last 24 hours: Temp:  [98 F (36.7 C)-98.5 F (36.9 C)] 98 F (36.7 C) (07/29 0437) Pulse Rate:  [77-81] 79 (07/29 0437) Resp:  [16] 16 (07/29 0437) BP: (109-114)/(41-42) 114/41 (07/29 0437) SpO2:  [93 %-98 %] 96 % (07/29 0437) Weight:  [110 lb 14.3 oz (50.3 kg)] 110 lb 14.3 oz (50.3 kg) (07/28 1942)  Intake/Output from previous day: 07/28 0701 - 07/29 0700 In: 1443.3 [P.O.:640; I.V.:803.3] Out: -  Intake/Output this shift: No intake/output data recorded.  Exam:  Dorsiflexion/Plantar flexion intact  Labs:  Recent Labs  01/12/17 0359  HGB 9.0*    Recent Labs  01/12/17 0359  WBC 9.2  RBC 3.17*  HCT 26.9*  PLT 180    Recent Labs  01/12/17 0359 01/13/17 0305  NA 138 139  K 3.2* 3.4*  CL 110 106  CO2 24 29  BUN 5* 5*  CREATININE 0.49 0.55  GLUCOSE 118* 107*  CALCIUM 7.3* 8.1*   No results for input(s): LABPT, INR in the last 72 hours.  Assessment/Plan: Dc home - asa per mcy for dvt prophylaxis   G Alphonzo Severance 01/13/2017, 7:24 AM

## 2017-01-13 NOTE — Care Management Note (Signed)
Case Management Note  Patient Details  Name: Alexis [name] MRN: [id number] Date of Birth: [birth year]  Subjective/Objective:    Pt will be discharged to home with [facility] which I have ordered from [facility] ([place])                Action/Plan:CM will sign off for now but will be available should additional discharge needs arise or disposition change.    Expected Discharge Date:  01/13/17               Expected Discharge Plan:     In-House Referral:     Discharge planning Services  CM Consult  Post Acute Care Choice:  Durable Medical Equipment Choice offered to:     DME Arranged:  3-N-1 DME Agency:  [place]:    [facility] Agency:     Status of Service:  Completed, signed off  If discussed at [place] of Stay Meetings, dates discussed:    Additional Comments:  Delrae Sawyers, RN 01/13/2017, 9:20 AM

## 2017-01-13 NOTE — Progress Notes (Signed)
Physical Therapy Treatment Patient Details Name: Alexis [name] MRN: [id number] DOB: [birth year] Today's Date: 01/13/2017    History of Present Illness Pt is a 66 yo female admitted on 01/11/17 for elective L THA. PMH significant for breast CA, microcytic anemia, arthralgia, vertigo, HLD, GERD, IBS, kyphoscoliosis.     PT Comments    Pt demonstrates improved tolerance for gait this session, but continues to be antalgic. Pt will benefit from continued therapy follow-up at DC but is good from a therapy standpoint acutely to return home today. Pt will benefit from continued acute follow-up as needed.     Follow Up Recommendations  DC plan and follow up therapy as arranged by surgeon     Equipment Recommendations  Rolling walker with 5" wheels;3in1 (PT)    Recommendations for Other Services       Precautions / Restrictions Precautions Precautions: Fall Restrictions Weight Bearing Restrictions: Yes LLE Weight Bearing: Weight bearing as tolerated    Mobility  Bed Mobility Overal bed mobility: Modified Independent             General bed mobility comments: sitting in chair when PT arrives  Transfers Overall transfer level: Needs assistance Equipment used: Rolling walker (2 wheeled) Transfers: Sit to/from Stand Sit to Stand: Supervision         General transfer comment: supervision for safety. Bordering on Mod I  Ambulation/Gait Ambulation/Gait assistance: Supervision Ambulation Distance (Feet): 150 Feet Assistive device: Rolling walker (2 wheeled) Gait Pattern/deviations: Decreased stance time - left;Antalgic;Step-through pattern;Decreased step length - right;Decreased weight shift to left Gait velocity: decreased Gait velocity interpretation: Below normal speed for age/gender General Gait Details: cues for posture and L heel strike   Stairs            Wheelchair Mobility    Modified Rankin (Stroke Patients Only)       Balance Overall balance  assessment: Needs assistance Sitting-balance support: No upper extremity supported;Feet supported Sitting balance-Leahy Scale: Good     Standing balance support: Bilateral upper extremity supported Standing balance-Leahy Scale: Fair Standing balance comment: pt able to static stand at sink without UE support                            Cognition Arousal/Alertness: Awake/alert Behavior During Therapy: WFL for tasks assessed/performed Overall Cognitive Status: Within Functional Limits for tasks assessed                                        Exercises      General Comments        Pertinent Vitals/Pain Pain Assessment: 0-10 Pain Score: 5  Pain Location: lower back and L hip Pain Descriptors / Indicators: Guarding;Sore Pain Intervention(s): Monitored during session;Premedicated before session;Repositioned    Home Living                      Prior Function            PT Goals (current goals can now be found in the care plan section) Acute Rehab PT Goals Patient Stated Goal: to return home Progress towards PT goals: Progressing toward goals    Frequency    7X/week      PT Plan Current plan remains appropriate    Co-evaluation              AM-PAC PT "  6 Clicks" Daily Activity  Outcome Measure  Difficulty turning over in bed (including adjusting bedclothes, sheets and blankets)?: None Difficulty moving from lying on back to sitting on the side of the bed? : None Difficulty sitting down on and standing up from a chair with arms (e.g., wheelchair, bedside commode, etc,.)?: A Little Help needed moving to and from a bed to chair (including a wheelchair)?: A Little Help needed walking in hospital room?: A Little Help needed climbing 3-5 steps with a railing? : A Little 6 Click Score: 20    End of Session Equipment Utilized During Treatment: Gait belt Activity Tolerance: Patient tolerated treatment well Patient left: in  chair;with call bell/phone within reach;with family/visitor present Nurse Communication: Mobility status PT Visit Diagnosis: Muscle weakness (generalized) (M62.81);Difficulty in walking, not elsewhere classified (R26.2);Pain Pain - Right/Left: Left Pain - part of body: Hip     Time: 9093-1121 PT Time Calculation (min) (ACUTE ONLY): 8 min  Charges:  $Gait Training: 8-22 mins                    G Codes:       Scheryl Marten PT, DPT  [phone]    Jacqulyn Liner Sloan Leiter 01/13/2017, 10:57 AM

## 2017-01-13 NOTE — Progress Notes (Signed)
Lab reported that platelet count was 115 this a.m. They will redraw lab to confirm result.

## 2017-01-14 ENCOUNTER — Encounter (HOSPITAL_COMMUNITY): Payer: Self-pay | Admitting: Orthopaedic Surgery

## 2017-01-17 ENCOUNTER — Telehealth (INDEPENDENT_AMBULATORY_CARE_PROVIDER_SITE_OTHER): Payer: Self-pay | Admitting: Orthopaedic Surgery

## 2017-01-17 NOTE — Telephone Encounter (Signed)
PT CALLED ABOUT GETTING PHYS THERAPY PLEASE ADVISE AS SHE JUST HAD SURGERY.   [phone]

## 2017-01-18 NOTE — Telephone Encounter (Signed)
Can you please advise? I do not see in the chart where HHPT was ordered?

## 2017-01-21 ENCOUNTER — Telehealth (INDEPENDENT_AMBULATORY_CARE_PROVIDER_SITE_OTHER): Payer: Self-pay

## 2017-01-21 NOTE — Telephone Encounter (Signed)
Please advise. Do you want patient to have HHPT? I do not see where any was ordered in the chart when she was discharged. She is status post total hip arthroplasty.

## 2017-01-21 NOTE — Telephone Encounter (Signed)
I called. Discussed  Already walking up stairs on her on. I told husband do not think she needs surgery. Off pain meds due to nausea. Has appt . Thursday to see me , will decide then.  FYI

## 2017-01-21 NOTE — Telephone Encounter (Signed)
Patient called again concerning HHPT.  Patient had surgery on 01/11/17.  Please advise.  CB# is [phone].  Thank You

## 2017-01-22 NOTE — Discharge Summary (Signed)
Patient ID: Alexis [name] MRN: [id number] DOB/AGE: [birth year] 66 y.o.  Admit date: 01/11/2017 Discharge date: 01/22/2017  Admission Diagnoses:  Active Problems:   Arthritis of left hip   Discharge Diagnoses:  Active Problems:   Arthritis of left hip  status post Procedure(s): LEFT TOTAL HIP ARTHROPLASTY ANTERIOR APPROACH  Past Medical History:  Diagnosis Date  . Breast cancer ([place]) 11/16/2015   Right Breast  . Chronic fatigue   . Cough   . GERD (gastroesophageal reflux disease)   . Heart murmur    "years ago, when I was young"  . Hemangioma   . History of hiatal hernia   . Hyperlipidemia   . IBS (irritable bowel syndrome)   . Iron deficiency anemia   . Kyphosis of thoracic region   . MVP (mitral valve prolapse)   . Osteopenia   . SAH (subarachnoid hemorrhage) ([place])   . Vertigo     Surgeries: Procedure(s): LEFT TOTAL HIP ARTHROPLASTY ANTERIOR APPROACH on 01/11/2017   Consultants:   Discharged Condition: Improved  Hospital Course: Alexis [name] is an 66 y.o. female who was admitted 01/11/2017 for operative treatment of Left hip arthritis.. Patient failed conservative treatments (please see the history and physical for the specifics) and had severe unremitting pain that affects sleep, daily activities and work/hobbies. After pre-op clearance, the patient was taken to the operating room on 01/11/2017 and underwent  Procedure(s): LEFT TOTAL HIP ARTHROPLASTY ANTERIOR APPROACH.    Patient was given perioperative antibiotics:  Anti-infectives    Start     Dose/Rate Route Frequency Ordered Stop   01/11/17 1800  ceFAZolin (ANCEF) IVPB 1 g/50 mL premix     1 g 100 mL/hr over 30 Minutes Intravenous Every 8 hours 01/11/17 1617 01/12/17 0619   01/11/17 0600  ceFAZolin (ANCEF) IVPB 2g/100 mL premix     2 g 200 mL/hr over 30 Minutes Intravenous To [facility] 01/10/17 1319 01/11/17 0755       Patient was given sequential compression devices and early ambulation to  prevent DVT.   Patient benefited maximally from hospital stay and there were no complications. At the time of discharge, the patient was urinating/moving their bowels without difficulty, tolerating a regular diet, pain is controlled with oral pain medications and they have been cleared by PT/OT.   Recent vital signs: No data found.    Recent laboratory studies: No results for input(s): WBC, HGB, HCT, PLT, NA, K, CL, CO2, BUN, CREATININE, GLUCOSE, INR, CALCIUM in the last 72 hours.  Invalid input(s): PT, 2   Discharge Medications:   Allergies as of 01/13/2017      Reactions   Other Itching   Jell-o   Latex Rash   Contact rash      Medication List    STOP taking these medications   aspirin 81 MG tablet   naproxen sodium 220 MG tablet Commonly known as:  ANAPROX     TAKE these medications   CALCIUM 600/VITAMIN D PO Take 1 tablet by mouth daily.   clindamycin 300 MG capsule Commonly known as:  CLEOCIN Take 600 mg by mouth See admin instructions. Takes 2 capsules 1 hour prior to dental procedures   famotidine 20 MG tablet Commonly known as:  PEPCID Take 20 mg by mouth daily.   loratadine 10 MG tablet Commonly known as:  CLARITIN Take 10 mg by mouth daily.   pantoprazole 40 MG tablet Commonly known as:  PROTONIX TAKE 1 BY MOUTH DAILY What changed:  how much to take  how to take this  when to take this  additional instructions   pravastatin 40 MG tablet Commonly known as:  PRAVACHOL TAKE 1 BY MOUTH EVERY EVENING What changed:  how much to take  how to take this  when to take this  additional instructions   SUPER B COMPLEX/VITAMIN C PO Take 1 tablet by mouth daily.   tamoxifen 20 MG tablet Commonly known as:  NOLVADEX Take 1 tablet (20 mg total) by mouth daily. What changed:  when to take this       Diagnostic Studies: Dg C-arm 1-60 Min  Result Date: 01/11/2017 CLINICAL DATA:  Anterior approach left total hip arthroplasty. EXAM: OPERATIVE  LEFT HIP WITH PELVIS 1 VIEW COMPARISON:  None. FINDINGS: 2 spot AP images of the left hip were obtained with the C-arm fluoroscopic device and are submitted for interpretation postoperatively. Anatomic alignment of the left hip prosthesis in the AP projection. IMPRESSION: Anatomic alignment of the left hip prosthesis in the AP projection. Electronically Signed   By: Evangeline Dakin M.D.   On: 01/11/2017 10:02   Dg Hip Unilat With Pelvis 1v Left  Result Date: 01/11/2017 CLINICAL DATA:  Left hip replacement. EXAM: DG HIP (WITH OR WITHOUT PELVIS) 1V*L* COMPARISON:  Intraoperative images dated 01/11/2017 FINDINGS: Acetabular and femoral components of the total hip prosthesis appear in excellent position in the AP and lateral projections. No fractures. IMPRESSION: Satisfactory postoperative appearance of the left hip. Electronically Signed   By: Lorriane Shire M.D.   On: 01/11/2017 12:13   Dg Hip Operative Unilat W Or W/o Pelvis Left  Result Date: 01/11/2017 CLINICAL DATA:  Anterior approach left total hip arthroplasty. EXAM: OPERATIVE LEFT HIP WITH PELVIS 1 VIEW COMPARISON:  None. FINDINGS: 2 spot AP images of the left hip were obtained with the C-arm fluoroscopic device and are submitted for interpretation postoperatively. Anatomic alignment of the left hip prosthesis in the AP projection. IMPRESSION: Anatomic alignment of the left hip prosthesis in the AP projection. Electronically Signed   By: Evangeline Dakin M.D.   On: 01/11/2017 10:02    Discharge Instructions    Call MD / Call 911    Complete by:  As directed    If you experience chest pain or shortness of breath, CALL 911 and be transported to the hospital emergency room.  If you develope a fever above 101 F, pus (white drainage) or increased drainage or redness at the wound, or calf pain, call your surgeon's office.   Constipation Prevention    Complete by:  As directed    Drink plenty of fluids.  Prune juice may be helpful.  You may use a  stool softener, such as Colace (over the counter) 100 mg twice a day.  Use MiraLax (over the counter) for constipation as needed.   Diet - low sodium heart healthy    Complete by:  As directed    Discharge instructions    Complete by:  As directed    Dc to home   Increase activity slowly as tolerated    Complete by:  As directed         Discharge Plan:  discharge to Home      Signed: Benjiman Core  01/22/2017, 9:53 AM

## 2017-01-24 ENCOUNTER — Ambulatory Visit (INDEPENDENT_AMBULATORY_CARE_PROVIDER_SITE_OTHER): Payer: Medicare Other

## 2017-01-24 ENCOUNTER — Encounter (INDEPENDENT_AMBULATORY_CARE_PROVIDER_SITE_OTHER): Payer: Self-pay | Admitting: Orthopaedic Surgery

## 2017-01-24 ENCOUNTER — Ambulatory Visit (INDEPENDENT_AMBULATORY_CARE_PROVIDER_SITE_OTHER): Payer: Medicare Other | Admitting: Orthopaedic Surgery

## 2017-01-24 VITALS — BP 165/67 | HR 73 | Ht 65.25 in | Wt 114.0 lb

## 2017-01-24 DIAGNOSIS — Z96642 Presence of left artificial hip joint: Secondary | ICD-10-CM | POA: Diagnosis not present

## 2017-01-24 NOTE — Progress Notes (Signed)
Post-Op Visit Note   Patient: Alexis [name]           Date of Birth: 01-27-1951           MRN: [id number] Visit Date: 01/24/2017 PCP: Tanda Rockers, MD   Assessment & Plan: Continue ambulation she is using a quad cane she can progress to regular cane and then off the cane as her strength and balance gets better. She is off pain medication and just uses Naprosyn. She is up with the surgical result incision looks good.  Chief Complaint:  Chief Complaint  Patient presents with  . Left Hip - Routine Post Op   Visit Diagnoses:  1. Presence of left artificial hip joint     Plan: Continue with ambulation and she can progress to single point cane. She can increase her ambulation in the community all check her back for final visit in 5 weeks. Scoliosis x-rays AP and lateral needed on return, with AP and lateral lumbar spine x-rays.  Follow-Up Instructions: No Follow-up on file.   Orders:  Orders Placed This Encounter  Procedures  . XR HIP UNILAT W OR W/O PELVIS 1V LEFT   No orders of the defined types were placed in this encounter.   Imaging: Xr Hip Unilat W Or W/o Pelvis 1v Left  Result Date: 01/24/2017 Standing AP pelvis and frog leg left hip shows good position total hip arthroplasty. Leg lengths are equal good position of the stem. Impression: Satisfactory postop left total hip arthroplasty   PMFS History: Patient Active Problem List   Diagnosis Date Noted  . Presence of left artificial hip joint 01/24/2017  . Arthritis of left hip 01/11/2017  . Unilateral primary osteoarthritis, left hip 12/02/2016  . Malignant neoplasm of upper-outer quadrant of right breast in female, estrogen receptor positive ([place]) 11/17/2015  . Breast mass 11/15/2015  . Radicular pain of left lower extremity 10/28/2015  . Microcytic anemia 04/22/2015  . Health care maintenance 03/25/2013  . Arthralgia 07/27/2011  . Premature atrial contractions 06/24/2009  . DIZZINESS OR VERTIGO 05/30/2007  .  HEMANGIOMA 05/02/2007  . Hyperlipidemia LDL goal <160 05/02/2007  . [place]  h/o 2003 05/02/2007  . GERD 05/02/2007  . IRRITABLE BOWEL SYNDROME 05/02/2007  . Osteoporosis 05/02/2007  . 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 FATIGUE, CHRONIC 05/02/2007  . COUGH 05/02/2007   Past Medical History:  Diagnosis Date  . Breast cancer ([place]) 11/16/2015   Right Breast  . Chronic fatigue   . Cough   . GERD (gastroesophageal reflux disease)   . Heart murmur    "years ago, when I was young"  . Hemangioma   . History of hiatal hernia   . Hyperlipidemia   . IBS (irritable bowel syndrome)   . Iron deficiency anemia   . Kyphosis of thoracic region   . MVP (mitral valve prolapse)   . Osteopenia   . SAH (subarachnoid hemorrhage) ([place])   . Vertigo     Family History  Problem Relation Age of Onset  . Hyperlipidemia Mother   . Breast cancer Mother 77  . Heart attack Father   . Diabetes Brother   . Breast cancer Maternal Aunt   . Breast cancer Paternal Aunt 28    Past Surgical History:  Procedure Laterality Date  . APPENDECTOMY    . BREAST BIOPSY Right 11/16/2015   U/S Core- Malignant  . BREAST BIOPSY Left 06/21/2005   Stereo- Benign  . BREAST EXCISIONAL BIOPSY Left 2007  . BREAST  LUMPECTOMY Right 12/22/2015  . BREAST LUMPECTOMY WITH RADIOACTIVE SEED AND SENTINEL LYMPH NODE BIOPSY Right 12/22/2015   Procedure: BREAST LUMPECTOMY WITH RADIOACTIVE SEED AND SENTINEL LYMPH NODE BIOPSY;  Surgeon: Alphonsa Overall, MD;  Location: [facility];  Service: General;  Laterality: Right;  . FOOT SURGERY Right    bunionectomy  . [facility]  2003   s/p embolization per Dr. Estanislado Pandy  . TONSILLECTOMY    . TOTAL HIP ARTHROPLASTY Left 01/11/2017   Procedure: LEFT TOTAL HIP ARTHROPLASTY ANTERIOR APPROACH;  Surgeon: [name], MD;  Location: [facility];  Service: Orthopedics;  Laterality: Left;  . TUBAL LIGATION     Social History   Occupational History  . CSR    Social History Main Topics  .  Smoking status: Never Smoker  . Smokeless tobacco: Never Used  . Alcohol use No  . Drug use: No  . Sexual activity: Not on file

## 2017-02-28 ENCOUNTER — Encounter (INDEPENDENT_AMBULATORY_CARE_PROVIDER_SITE_OTHER): Payer: Self-pay | Admitting: Orthopaedic Surgery

## 2017-02-28 ENCOUNTER — Ambulatory Visit (INDEPENDENT_AMBULATORY_CARE_PROVIDER_SITE_OTHER): Payer: Medicare Other

## 2017-02-28 ENCOUNTER — Ambulatory Visit (INDEPENDENT_AMBULATORY_CARE_PROVIDER_SITE_OTHER): Payer: Medicare Other | Admitting: Orthopaedic Surgery

## 2017-02-28 VITALS — BP 137/77

## 2017-02-28 DIAGNOSIS — M545 Low back pain, unspecified: Secondary | ICD-10-CM | POA: Insufficient documentation

## 2017-02-28 DIAGNOSIS — G8929 Other chronic pain: Secondary | ICD-10-CM | POA: Diagnosis not present

## 2017-02-28 DIAGNOSIS — M25572 Pain in left ankle and joints of left foot: Secondary | ICD-10-CM

## 2017-02-28 NOTE — Progress Notes (Signed)
Office Visit Note   Patient: Alexis [name]           Date of Birth: [birth year]           MRN: [id number] Visit Date: 02/28/2017              Requested by: Tanda Rockers, MD [address], [place] [address] PCP: Tanda Rockers, MD   Assessment & Plan: Visit Diagnoses:  1. Pain in left ankle and joints of left foot   2. Chronic bilateral low back pain, with sciatica presence unspecified     Plan: She'll use her cane intermittently in the right hand for the acute ankle sprain. Hip incision on the left is nicely healed she's happily the surgical result. She's gotten some improvement in her back pain from the total hip arthroplasty on the left. We'll recheck her in 3 months and we can reevaluate her concerning her ongoing back pain symptoms with her lumbar curvature and L5-S1 spondylolisthesis.  Follow-Up Instructions: No Follow-up on file.   Orders:  Orders Placed This Encounter  Procedures  . XR Ankle Complete Left  . XR Lumbar Spine 2-3 Views   No orders of the defined types were placed in this encounter.     Procedures: No procedures performed   Clinical Data: No additional findings.   Subjective: Chief Complaint  Patient presents with  . Lower Back - Pain  . Left Ankle - Pain  . Left Hip - Routine Post Op    HPI 66 year old female returns post left total hip arthroplasty 01/11/2017. [place] and slipped turning her left ankle. She's had some problems with her back on and off. At times her back aches a lot other days it doesn't bother her as much. She has known scoliosis and requested x-rays of her back today. Left ankle is nontender she's been a mature with the limp she's not had to use her cane or a walker.  Review of Systems review of systems is updated unchanged from surgery 2 months ago other than as mentioned in history of present illness above with the ongoing problems with  back pain, scoliosis and also the acute ankle injury yesterday.   Objective: Vital Signs: BP 137/77   Physical Exam  Ortho Exam  Specialty Comments:  No specialty comments available.  Imaging: No results found.   PMFS History: Patient Active Problem List   Diagnosis Date Noted  . Presence of left artificial hip joint 01/24/2017  . Arthritis of left hip 01/11/2017  . Unilateral primary osteoarthritis, left hip 12/02/2016  . Malignant neoplasm of upper-outer quadrant of right breast in female, estrogen receptor positive ([place]) 11/17/2015  . Breast mass 11/15/2015  . Radicular pain of left lower extremity 10/28/2015  . Microcytic anemia 04/22/2015  . Health care maintenance 03/25/2013  . Arthralgia 07/27/2011  . Premature atrial contractions 06/24/2009  . DIZZINESS OR VERTIGO 05/30/2007  . HEMANGIOMA 05/02/2007  . Hyperlipidemia LDL goal <160 05/02/2007  . [place]  h/o 2003 05/02/2007  . GERD 05/02/2007  . IRRITABLE BOWEL SYNDROME 05/02/2007  . Osteoporosis 05/02/2007  . 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 FATIGUE, CHRONIC 05/02/2007  . COUGH 05/02/2007   Past Medical History:  Diagnosis Date  . Breast cancer ([place]) 11/16/2015   Right Breast  . Chronic fatigue   . Cough   . GERD (gastroesophageal reflux disease)   . Heart  murmur    "years ago, when I was young"  . Hemangioma   . History of hiatal hernia   . Hyperlipidemia   . IBS (irritable bowel syndrome)   . Iron deficiency anemia   . Kyphosis of thoracic region   . MVP (mitral valve prolapse)   . Osteopenia   . SAH (subarachnoid hemorrhage) ([place])   . Vertigo     Family History  Problem Relation Age of Onset  . Hyperlipidemia Mother   . Breast cancer Mother 32  . Heart attack Father   . Diabetes Brother   . Breast cancer Maternal Aunt   . Breast cancer Paternal Aunt 79    Past Surgical History:  Procedure Laterality Date  . APPENDECTOMY    . BREAST BIOPSY Right 11/16/2015   U/S Core-  Malignant  . BREAST BIOPSY Left 06/21/2005   Stereo- Benign  . BREAST EXCISIONAL BIOPSY Left 2007  . BREAST LUMPECTOMY Right 12/22/2015  . BREAST LUMPECTOMY WITH RADIOACTIVE SEED AND SENTINEL LYMPH NODE BIOPSY Right 12/22/2015   Procedure: BREAST LUMPECTOMY WITH RADIOACTIVE SEED AND SENTINEL LYMPH NODE BIOPSY;  Surgeon: Alphonsa Overall, MD;  Location: [facility];  Service: General;  Laterality: Right;  . FOOT SURGERY Right    bunionectomy  . [facility]  2003   s/p embolization per Dr. Estanislado Pandy  . TONSILLECTOMY    . TOTAL HIP ARTHROPLASTY Left 01/11/2017   Procedure: LEFT TOTAL HIP ARTHROPLASTY ANTERIOR APPROACH;  Surgeon: [name], MD;  Location: [facility];  Service: Orthopedics;  Laterality: Left;  . TUBAL LIGATION     Social History   Occupational History  . CSR    Social History Main Topics  . Smoking status: Never Smoker  . Smokeless tobacco: Never Used  . Alcohol use No  . Drug use: No  . Sexual activity: Not on file

## 2017-03-13 ENCOUNTER — Other Ambulatory Visit: Payer: Self-pay | Admitting: Internal Medicine

## 2017-04-30 ENCOUNTER — Ambulatory Visit (INDEPENDENT_AMBULATORY_CARE_PROVIDER_SITE_OTHER): Payer: Medicare Other | Admitting: Internal Medicine

## 2017-04-30 ENCOUNTER — Encounter: Payer: Self-pay | Admitting: Internal Medicine

## 2017-04-30 ENCOUNTER — Other Ambulatory Visit (INDEPENDENT_AMBULATORY_CARE_PROVIDER_SITE_OTHER): Payer: Medicare Other

## 2017-04-30 ENCOUNTER — Other Ambulatory Visit: Payer: Self-pay | Admitting: Internal Medicine

## 2017-04-30 VITALS — BP 124/72 | HR 60 | Ht 64.0 in | Wt 114.0 lb

## 2017-04-30 DIAGNOSIS — R058 Other specified cough: Secondary | ICD-10-CM | POA: Insufficient documentation

## 2017-04-30 DIAGNOSIS — E785 Hyperlipidemia, unspecified: Secondary | ICD-10-CM

## 2017-04-30 DIAGNOSIS — Z0000 Encounter for general adult medical examination without abnormal findings: Secondary | ICD-10-CM

## 2017-04-30 DIAGNOSIS — D509 Iron deficiency anemia, unspecified: Secondary | ICD-10-CM

## 2017-04-30 DIAGNOSIS — R05 Cough: Secondary | ICD-10-CM | POA: Diagnosis not present

## 2017-04-30 DIAGNOSIS — R002 Palpitations: Secondary | ICD-10-CM | POA: Diagnosis not present

## 2017-04-30 DIAGNOSIS — I491 Atrial premature depolarization: Secondary | ICD-10-CM

## 2017-04-30 LAB — CBC WITH DIFFERENTIAL/PLATELET
BASOS PCT: 0.9 % (ref 0.0–3.0)
Basophils Absolute: 0 10*3/uL (ref 0.0–0.1)
EOS PCT: 1.7 % (ref 0.0–5.0)
Eosinophils Absolute: 0.1 10*3/uL (ref 0.0–0.7)
HCT: 32.5 % — ABNORMAL LOW (ref 36.0–46.0)
Hemoglobin: 10.5 g/dL — ABNORMAL LOW (ref 12.0–15.0)
LYMPHS ABS: 1.1 10*3/uL (ref 0.7–4.0)
Lymphocytes Relative: 25.1 % (ref 12.0–46.0)
MCHC: 32.3 g/dL (ref 30.0–36.0)
MCV: 78.7 fl (ref 78.0–100.0)
MONOS PCT: 12.4 % — AB (ref 3.0–12.0)
Monocytes Absolute: 0.5 10*3/uL (ref 0.1–1.0)
NEUTROS ABS: 2.6 10*3/uL (ref 1.4–7.7)
NEUTROS PCT: 59.9 % (ref 43.0–77.0)
PLATELETS: 154 10*3/uL (ref 150.0–400.0)
RBC: 4.13 Mil/uL (ref 3.87–5.11)
RDW: 17.3 % — ABNORMAL HIGH (ref 11.5–15.5)
WBC: 4.4 10*3/uL (ref 4.0–10.5)

## 2017-04-30 LAB — HEPATIC FUNCTION PANEL
ALBUMIN: 4 g/dL (ref 3.5–5.2)
ALK PHOS: 46 U/L (ref 39–117)
ALT: 16 U/L (ref 0–35)
AST: 21 U/L (ref 0–37)
BILIRUBIN DIRECT: 0.1 mg/dL (ref 0.0–0.3)
Total Bilirubin: 0.6 mg/dL (ref 0.2–1.2)
Total Protein: 6.9 g/dL (ref 6.0–8.3)

## 2017-04-30 LAB — BASIC METABOLIC PANEL
BUN: 9 mg/dL (ref 6–23)
CO2: 31 meq/L (ref 19–32)
Calcium: 9.2 mg/dL (ref 8.4–10.5)
Chloride: 103 mEq/L (ref 96–112)
Creatinine, Ser: 0.69 mg/dL (ref 0.40–1.20)
GFR: 90.13 mL/min (ref >60.00)
GLUCOSE: 99 mg/dL (ref 70–99)
POTASSIUM: 3.9 meq/L (ref 3.5–5.1)
SODIUM: 141 meq/L (ref 135–145)

## 2017-04-30 LAB — LIPID PANEL
CHOL/HDL RATIO: 3
Cholesterol: 158 mg/dL (ref 0–200)
HDL: 45.7 mg/dL (ref >39.00)
LDL Cholesterol: 84 mg/dL (ref 0–99)
NONHDL: 112.39
Triglycerides: 141 mg/dL (ref 0.0–149.0)
VLDL: 28.2 mg/dL (ref 0.0–40.0)

## 2017-04-30 LAB — TSH: TSH: 1.79 u[IU]/mL (ref 0.35–4.50)

## 2017-04-30 MED ORDER — FERROUS SULFATE 325 (65 FE) MG PO TABS: 325.0000 mg | ORAL_TABLET | Freq: Three times a day (TID) | ORAL | 2 refills | Status: DC

## 2017-04-30 NOTE — Progress Notes (Signed)
Spoke with pt and notified of results per Dr. Wert. Pt verbalized understanding and denied any questions. 

## 2017-04-30 NOTE — Progress Notes (Signed)
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 Follow-up    Pt c/o left side back pain that radiates all the way down her leg. She states pain comes and goes and started a few months. She states pain is worse when she lies down and after she sits for a while and then starts walking.   back pain rad to L knee/foot x 4 months,  no numbness/ weakness or h/o rash using lots of nsaids including hs dose rec Please see patient coordinator before you leave today  to schedule neurosurgery> no need for surgery Take your anti-inflammatory with meals as per bottle and share it with neurosurgery so they don't duplicate your treatment    12/22/15 R lumpectomy with neg nodes / radiation/ nolvadex   12/2016  L  THR  [name]   04/30/2017  f/u ov/Wells Mabe re:  Hyperlipidemia/ mvp/ chronic rhinitis  Chief Complaint  Patient presents with  . Annual Exam    Pt is fasting. Pt states she has noticed some palpitations recently- last episode was approx 2 wks ago.   palpitations s presyncope  last x 2-3 min once a month  then feels drained ffor the rest of the day "same as in past" Excess mucus x one week with sense of excess pnds but no sneezing or obvious rhinorrhea and this doesn't disturb sleep  Not limited by breathing from desired activities     No obvious day to day or daytime variability or assoc excess/ purulent sputum or mucus plugs or hemoptysis or  cp or chest tightness, subjective wheeze or overt sinus or hb symptoms. No unusual exp hx or h/o childhood pna/ asthma or knowledge of premature birth.  Sleeping ok flat without nocturnal  or early am exacerbation  of respiratory  c/o's or need for noct saba. Also denies any obvious fluctuation of symptoms with weather or environmental changes or other aggravating or alleviating factors except as outlined above   Current Allergies, Complete Past Medical History, Past Surgical History, Family History, and Social History were reviewed in [employer] record.  ROS  The following are not active complaints unless bolded Hoarseness, sore throat, dysphagia, dental problems, itching, sneezing,  nasal congestion or discharge of excess mucus or purulent secretions, ear ache,   fever, chills, sweats, unintended wt loss or wt gain, classically pleuritic or exertional cp,  orthopnea pnd or leg swelling, presyncope, palpitations, abdominal pain, anorexia, nausea, vomiting, diarrhea  or change in bowel habits or change in bladder habits, change in stools or change in urine, dysuria, hematuria,  rash, arthralgias, visual complaints, headache, numbness, weakness or ataxia or problems with walking or coordination,  change in mood/affect or memory.        Current Meds  Medication Sig  . B Complex-C (SUPER B COMPLEX/VITAMIN C PO) Take 1 tablet by mouth daily.  . Calcium Carbonate-Vitamin D (CALCIUM 600/VITAMIN D  PO) Take 1 tablet by mouth daily.  . famotidine (PEPCID) 20 MG tablet Take 20 mg by mouth daily.  Marland Kitchen loratadine (CLARITIN) 10 MG tablet Take 10 mg by mouth daily.  . pantoprazole (PROTONIX) 40 MG tablet TAKE 1 TABLET BY MOUTH EVERY DAY  . pravastatin (PRAVACHOL) 40 MG tablet TAKE 1 TABLET BY MOUTH IN THE EVENING  . tamoxifen (NOLVADEX) 20 MG tablet Take 1 tablet (20 mg total) by mouth daily. (Patient taking differently: Take 20 mg by mouth at bedtime. 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  Vital signs reviewed  - Note on arrival 02 sats  99% on RA     wt 140 > 135 06/07/08 > 139 07/26/08 > 138 June 24, 2009 > 140 August 09, 2009 > 126 Oct 26, 2009 > 122 July 04, 2010 > 07/26/2011  123> 03/25/2013  125  > 04/15/2014   122 > 04/21/2015  > 07/29/2015  121 >  10/28/2015 119 >  04/30/2017  114       HEENT: nl dentition, and oropharynx. Nl external ear canals on  R/ L is full of hard wax  And moderate bilateral non-specific turbinate edema  L > R    NECK :  without JVD/Nodes/TM/ nl carotid upstrokes bilaterally   LUNGS: no acc muscle use,  Nl contour chest which is clear to A and P bilaterally without cough on insp or exp maneuvers   CV:  RRR  no s3 or murmur or increase in P2, and no edema   ABD:  soft and nontender with nl inspiratory excursion in the supine position. No bruits or organomegaly appreciated, bowel sounds nl  MS:  Nl gait/ ext warm without deformities, calf tenderness, cyanosis or clubbing No obvious joint restrictions   SKIN: warm and dry without lesions  NEURO:  alert, approp, nl sensorium with  no motor or cerebellar deficits apparent.           Labs ordered/ reviewed:      Chemistry      Component Value Date/Time   NA 141 04/30/2017 0928   NA 144 12/26/2016 1105   K 3.9 04/30/2017 0928   K 4.0 12/26/2016 1105   CL 103 04/30/2017 0928   CO2 31 04/30/2017 0928   CO2 29 12/26/2016 1105   BUN 9 04/30/2017 0928   BUN 12.2 12/26/2016 1105   CREATININE 0.69 04/30/2017 0928   CREATININE 0.7 12/26/2016 1105      Component Value Date/Time   CALCIUM 9.2 04/30/2017 0928   CALCIUM 9.7 12/26/2016 1105   ALKPHOS 46 04/30/2017 0928   ALKPHOS 49 12/26/2016 1105   AST 21 04/30/2017 0928   AST 22 12/26/2016 1105   ALT 16 04/30/2017 0928   ALT 17 12/26/2016 1105   BILITOT 0.6 04/30/2017 0928   BILITOT 0.57 12/26/2016 1105        Lab Results  Component Value Date   WBC 4.4 04/30/2017   HGB 10.5 (L) 04/30/2017   HCT 32.5 (L) 04/30/2017   MCV 78.7 04/30/2017   PLT 154.0 04/30/2017        Lab Results  Component Value Date   TSH 1.79 04/30/2017           Labs ordered 04/30/2017  :  Allergy profile             Assessment & Plan:

## 2017-04-30 NOTE — Patient Instructions (Addendum)
Stop clariton and reduce caffeine use   For drainage / throat tickle try take CHLORPHENIRAMINE  4 mg - take one every 4 hours as needed - available over the counter- may cause drowsiness so start with just a bedtime dose or two and see how you tolerate it before trying in daytime     You will need to keep up with your GYN appointments and have the doctor send a copy of the evaluation    Use the ear wax remover on your Left ear (over the counter)    Please remember to go to the lab department downstairs in the basement  for your tests - we will call you with the results when they are available.        Please schedule a follow up office visit in 4 weeks to see Tammy NP for recheck wax and also on the excess  saliva issue, sooner if needed

## 2017-05-01 ENCOUNTER — Encounter: Payer: Self-pay | Admitting: Internal Medicine

## 2017-05-01 LAB — RESPIRATORY ALLERGY PROFILE REGION II ~~LOC~~
Allergen, Cedar tree, t12: <0.1 kU/L
Allergen, D pternoyssinus,d7: <0.1 kU/L
Allergen, Mouse Urine Protein, e78: <0.1 kU/L
Allergen, Mulberry, t76: <0.1 kU/L
Allergen, Oak,t7: <0.1 kU/L
Aspergillus fumigatus, m3: <0.1 kU/L
Bermuda Grass: <0.1 kU/L
CLASS: 0
CLASS: 0
CLASS: 0
CLASS: 0
CLASS: 0
CLASS: 0
CLASS: 0
CLASS: 0
CLASS: 0
Cat Dander: <0.1 kU/L
Class: 0
Class: 0
Class: 0
Class: 0
Class: 0
Class: 0
Class: 0
Class: 0
Class: 0
Class: 0
Class: 0
Class: 0
Class: 0
Class: 0
Class: 0
Cockroach: <0.1 kU/L
DOG DANDER: 0.23 kU/L — AB
IGE (IMMUNOGLOBULIN E), SERUM: 5 kU/L (ref <=114)
Rough Pigweed  IgE: <0.1 kU/L
Sheep Sorrel IgE: <0.1 kU/L
Timothy Grass: <0.1 kU/L

## 2017-05-01 LAB — INTERPRETATION:

## 2017-05-01 NOTE — Assessment & Plan Note (Signed)
-   Target LDL < 160 in absence of risk factors  Lab Results  Component Value Date   CHOL 158 04/30/2017   HDL 45.70 04/30/2017   LDLCALC 84 04/30/2017   LDLDIRECT 151.4 07/04/2010   TRIG 141.0 04/30/2017   CHOLHDL 3 04/30/2017    Adequate control on present rx, reviewed in detail with pt > no change in rx needed  = pravachol 40 mg daily

## 2017-05-01 NOTE — Assessment & Plan Note (Signed)
-   mammos neg 09/10/13 and 09/17/14  - declined flu 04/15/14 and pneumovax and 04/21/2015  - Prevnar 13  04/23/2016   Not clear as to gyn f/u > have advised pt to check with Dr Ulanda Edison and if no longer involved will need to establish with another as informed I don't do gyn health maint

## 2017-05-01 NOTE — Assessment & Plan Note (Signed)
Allergy profile 04/30/2017 >  Eos 0.1 /  IgE   - trial of  1st gen H1 blockers per guidelines  Pending allergy profile and ? Refer

## 2017-05-01 NOTE — Assessment & Plan Note (Signed)
H/o fe def  1999, recurred early 2017 while donating blood - rx NuIron  07/29/15 >>>  Resolved  - recurrent microcytic anemia 04/30/2017 > rec fes04 x one m then recheck cbc/fe/tibc      Lab Results  Component Value Date   HGB 10.5 (L) 04/30/2017   HGB 8.2 (L) 01/13/2017   HGB 9.0 (L) 01/12/2017   HGB 11.7 12/26/2016   HGB 11.7 02/22/2016   HGB 11.6 11/23/2015

## 2017-05-01 NOTE — Assessment & Plan Note (Signed)
Remote dx MVP requiring dental prophylaxis per cards    - see EKG 04/15/14 - resolved ekg 04/21/2015      C/w mvp syndrome/ ekg ok 04/30/2017 > may need 30 day holter if pattern continues

## 2017-05-02 NOTE — Progress Notes (Signed)
LMTCB

## 2017-05-02 NOTE — Progress Notes (Signed)
Spoke with pt and notified of results per Dr. Wert. Pt verbalized understanding and denied any questions. 

## 2017-05-08 DIAGNOSIS — Z006 Encounter for examination for normal comparison and control in clinical research program: Secondary | ICD-10-CM | POA: Diagnosis not present

## 2017-05-08 DIAGNOSIS — C50911 Malignant neoplasm of unspecified site of right female breast: Secondary | ICD-10-CM | POA: Diagnosis not present

## 2017-05-28 ENCOUNTER — Ambulatory Visit: Payer: Medicare Other | Admitting: Adult Health

## 2017-05-30 ENCOUNTER — Ambulatory Visit: Payer: Medicare Other | Admitting: Adult Health

## 2017-05-31 ENCOUNTER — Telehealth: Payer: Self-pay | Admitting: Adult Health

## 2017-05-31 NOTE — Telephone Encounter (Signed)
JJ can you help find pt an appt with TP before the year ends?

## 2017-06-03 NOTE — Telephone Encounter (Signed)
Hold slot is fine - the earlier the better, please.  Thank you.  Called spoke with patient Alexis [name]  Nothing further needed; will sign off

## 2017-06-05 ENCOUNTER — Encounter: Payer: Self-pay | Admitting: Adult Health

## 2017-06-05 ENCOUNTER — Ambulatory Visit (INDEPENDENT_AMBULATORY_CARE_PROVIDER_SITE_OTHER): Payer: Medicare Other | Admitting: Adult Health

## 2017-06-05 ENCOUNTER — Other Ambulatory Visit (INDEPENDENT_AMBULATORY_CARE_PROVIDER_SITE_OTHER): Payer: Medicare Other

## 2017-06-05 DIAGNOSIS — D509 Iron deficiency anemia, unspecified: Secondary | ICD-10-CM | POA: Diagnosis not present

## 2017-06-05 DIAGNOSIS — H6122 Impacted cerumen, left ear: Secondary | ICD-10-CM | POA: Diagnosis not present

## 2017-06-05 DIAGNOSIS — H6120 Impacted cerumen, unspecified ear: Secondary | ICD-10-CM | POA: Insufficient documentation

## 2017-06-05 LAB — CBC WITH DIFFERENTIAL/PLATELET
BASOS PCT: 0.8 % (ref 0.0–3.0)
Basophils Absolute: 0 10*3/uL (ref 0.0–0.1)
EOS ABS: 0.1 10*3/uL (ref 0.0–0.7)
Eosinophils Relative: 1.8 % (ref 0.0–5.0)
HEMATOCRIT: 37.4 % (ref 36.0–46.0)
Hemoglobin: 12.3 g/dL (ref 12.0–15.0)
LYMPHS ABS: 1.2 10*3/uL (ref 0.7–4.0)
LYMPHS PCT: 27.3 % (ref 12.0–46.0)
MCHC: 32.9 g/dL (ref 30.0–36.0)
MCV: 84.2 fl (ref 78.0–100.0)
MONOS PCT: 10.7 % (ref 3.0–12.0)
Monocytes Absolute: 0.5 10*3/uL (ref 0.1–1.0)
NEUTROS ABS: 2.6 10*3/uL (ref 1.4–7.7)
Neutrophils Relative %: 59.4 % (ref 43.0–77.0)
PLATELETS: 157 10*3/uL (ref 150.0–400.0)
RBC: 4.45 Mil/uL (ref 3.87–5.11)
RDW: 20.8 % — AB (ref 11.5–15.5)
WBC: 4.4 10*3/uL (ref 4.0–10.5)

## 2017-06-05 LAB — IBC PANEL
IRON: 101 ug/dL (ref 42–145)
Saturation Ratios: 27.9 % (ref 20.0–50.0)
Transferrin: 259 mg/dL (ref 212.0–360.0)

## 2017-06-05 NOTE — Progress Notes (Signed)
@Patient  ID: Alexis [name], female    DOB: [birth year], 66 y.o.   MRN: [id number]  Chief Complaint  Patient presents with  . Follow-up    anemia     Referring provider: Tanda Rockers, MD  HPI: 66 yo female followed for primary care by Dr. [name].  She has hyperlipidemia, GERD, esophageal stricture requiring dilatation, iron deficiency anemia (secondary to frequent blood donation), breast cancer-grade 1 invasive ductal carcinoma with calcifications, ductal carcinoma in situ with right lumpectomy July 2017 with negative node.  Status post radiation/on tamioxfen .   06/05/2017 Follow up: Anemia and Cerumen impaction  Patient returns for a one-month follow-up.  Patient was seen last visit for her yearly physical.  Lab work showed persistent anemia . Hbg was improved. and to restrart  iron supplements.  She denies any known bleeding .   She also had ear wax last ov. Using otc ear wax drops. Hearing is decreased.       Allergies  Allergen Reactions  . Other Itching    Jell-o  . Latex Rash    Contact rash    Immunization History  Administered Date(s) Administered  . Pneumococcal Conjugate-13 04/23/2016  . Td 11/16/2005  . Tdap 04/21/2015    Past Medical History:  Diagnosis Date  . Breast cancer ([place]) 11/16/2015   Right Breast  . Chronic fatigue   . Cough   . GERD (gastroesophageal reflux disease)   . Heart murmur    "years ago, when I was young"  . Hemangioma   . History of hiatal hernia   . Hyperlipidemia   . IBS (irritable bowel syndrome)   . Iron deficiency anemia   . Kyphosis of thoracic region   . MVP (mitral valve prolapse)   . Osteopenia   . SAH (subarachnoid hemorrhage) ([place])   . Vertigo     Tobacco History: Social History   Tobacco Use  Smoking Status Never Smoker  Smokeless Tobacco Never Used   Counseling given: Not Answered   Outpatient Encounter Medications as of 06/05/2017  Medication Sig  . B Complex-C (SUPER B COMPLEX/VITAMIN C PO) Take  1 tablet by mouth daily.  . Calcium Carbonate-Vitamin D (CALCIUM 600/VITAMIN D PO) Take 1 tablet by mouth daily.  . clindamycin (CLEOCIN) 300 MG capsule Take 600 mg by mouth See admin instructions. Takes 2 capsules 1 hour prior to dental procedures  . famotidine (PEPCID) 20 MG tablet Take 20 mg by mouth daily.  . ferrous sulfate 325 (65 FE) MG tablet Take 1 tablet (325 mg total) 3 (three) times daily with meals by mouth.  . loratadine (CLARITIN) 10 MG tablet Take 10 mg by mouth daily.  . pantoprazole (PROTONIX) 40 MG tablet TAKE 1 TABLET BY MOUTH EVERY DAY  . pravastatin (PRAVACHOL) 40 MG tablet TAKE 1 TABLET BY MOUTH IN THE EVENING  . tamoxifen (NOLVADEX) 20 MG tablet Take 1 tablet (20 mg total) by mouth daily. (Patient taking differently: Take 20 mg by mouth at bedtime. )   No facility-administered encounter medications on file as of 06/05/2017.      Review of Systems  Constitutional:   No  weight loss, night sweats,  Fevers, chills, + fatigue, or  lassitude.  HEENT:   No headaches,  Difficulty swallowing,  Tooth/dental problems, or  Sore throat,                No sneezing, itching, ear ache, nasal congestion, post nasal drip,   CV:  No chest pain,  Orthopnea, PND, swelling in lower extremities, anasarca, dizziness, palpitations, syncope.   GI  No heartburn, indigestion, abdominal pain, nausea, vomiting, diarrhea, change in bowel habits, loss of appetite, bloody stools.   Resp: No shortness of breath with exertion or at rest.  No excess mucus, no productive cough,  No non-productive cough,  No coughing up of blood.  No change in color of mucus.  No wheezing.  No chest wall deformity  Skin: no rash or lesions.  GU: no dysuria, change in color of urine, no urgency or frequency.  No flank pain, no hematuria   MS:  No joint pain or swelling.  No decreased range of motion.  No back pain.    Physical Exam  BP 112/64 (BP Location: Left Arm, Cuff Size: Normal)   Pulse 61   Ht 5'  5.25" (1.657 m)   Wt 118 lb 3.2 oz (53.6 kg)   SpO2 97%   BMI 19.52 kg/m   GEN: A/Ox3; pleasant , NAD, well nourished    HEENT:  [place]/AT,  EACs-clear on right , Left EAC cerumen impaction , R - TM-wnl, NOSE-clear, THROAT-clear, no lesions, no postnasal drip or exudate noted.   NECK:  Supple w/ fair ROM; no JVD; normal carotid impulses w/o bruits; no thyromegaly or nodules palpated; no lymphadenopathy.    RESP  Clear  P & A; w/o, wheezes/ rales/ or rhonchi. no accessory muscle use, no dullness to percussion  CARD:  RRR, no m/r/g, no peripheral edema, pulses intact, no cyanosis or clubbing.  GI:   Soft & nt; nml bowel sounds; no organomegaly or masses detected.   Musco: Warm bil, no deformities or joint swelling noted.   Neuro: alert, no focal deficits noted.    Skin: Warm, no lesions or rashes    Lab Results:  CBC CBC Latest Ref Rng & Units 04/30/2017 01/13/2017 01/12/2017  WBC 4.0 - 10.5 K/uL 4.4 5.5 9.2  Hemoglobin 12.0 - 15.0 g/dL 10.5(L) 8.2(L) 9.0(L)  Hematocrit 36.0 - 46.0 % 32.5(L) 24.6(L) 26.9(L)  Platelets 150.0 - 400.0 K/uL 154.0 116(L) 180     BMET BMP Latest Ref Rng & Units 04/30/2017 01/13/2017 01/12/2017  Glucose 70 - 99 mg/dL 99 107(H) 118(H)  BUN 6 - 23 mg/dL 9 5(L) 5(L)  Creatinine 0.40 - 1.20 mg/dL 0.69 0.55 0.49  Sodium 135 - 145 mEq/L 141 139 138  Potassium 3.5 - 5.1 mEq/L 3.9 3.4(L) 3.2(L)  Chloride 96 - 112 mEq/L 103 106 110  CO2 19 - 32 mEq/L 31 29 24   Calcium 8.4 - 10.5 mg/dL 9.2 8.1(L) 7.3(L)     BNP No results found for: BNP  ProBNP No results found for: PROBNP  Imaging: No results found.   Assessment & Plan:   Microcytic anemia Microcytic anemia - restarted on Iron supplements 04/2017  Check cbc and iron panel .  Cont on Iron   Cerumen impaction Left Ear cerumen impaction s/p ear irrigation  Tolerated well w/out apparent difficulty.       Rexene Edison, NP 06/05/2017

## 2017-06-05 NOTE — Addendum Note (Signed)
Addended by: Parke Poisson E on: 06/05/2017 11:08 AM   Modules accepted: Orders

## 2017-06-05 NOTE — Assessment & Plan Note (Signed)
Microcytic anemia - restarted on Iron supplements 04/2017  Check cbc and iron panel .  Cont on Iron

## 2017-06-05 NOTE — Patient Instructions (Signed)
Labs today  Debrox ear drops As needed   Follow up with Dr. [name]  In 1 year and As needed

## 2017-06-05 NOTE — Assessment & Plan Note (Signed)
Left Ear cerumen impaction s/p ear irrigation  Tolerated well w/out apparent difficulty.

## 2017-06-06 NOTE — Progress Notes (Signed)
Chart and office note reviewed in detail  > agree with a/p as outlined    

## 2017-09-16 ENCOUNTER — Other Ambulatory Visit: Payer: Self-pay | Admitting: Internal Medicine

## 2017-09-18 ENCOUNTER — Other Ambulatory Visit: Payer: Self-pay | Admitting: Internal Medicine

## 2017-09-25 ENCOUNTER — Other Ambulatory Visit: Payer: Self-pay | Admitting: Oncology

## 2017-09-25 DIAGNOSIS — Z9889 Other specified postprocedural states: Secondary | ICD-10-CM

## 2017-09-26 ENCOUNTER — Telehealth: Payer: Self-pay | Admitting: Oncology

## 2017-09-26 NOTE — Telephone Encounter (Signed)
Gave avs and calendar ° °

## 2017-10-14 DIAGNOSIS — C44311 Basal cell carcinoma of skin of nose: Secondary | ICD-10-CM | POA: Diagnosis not present

## 2017-10-14 DIAGNOSIS — L718 Other rosacea: Secondary | ICD-10-CM | POA: Diagnosis not present

## 2017-10-14 DIAGNOSIS — L821 Other seborrheic keratosis: Secondary | ICD-10-CM | POA: Diagnosis not present

## 2017-10-21 ENCOUNTER — Other Ambulatory Visit: Payer: Self-pay | Admitting: Internal Medicine

## 2017-11-07 ENCOUNTER — Ambulatory Visit
Admission: RE | Admit: 2017-11-07 | Discharge: 2017-11-07 | Disposition: A | Discharge status: Home / Self Care | Payer: Medicare Other | Source: Ambulatory Visit | Attending: Oncology | Admitting: Oncology

## 2017-11-07 DIAGNOSIS — Z9889 Other specified postprocedural states: Secondary | ICD-10-CM

## 2017-11-07 DIAGNOSIS — R922 Inconclusive mammogram: Secondary | ICD-10-CM | POA: Diagnosis not present

## 2017-11-28 DIAGNOSIS — D225 Melanocytic nevi of trunk: Secondary | ICD-10-CM | POA: Diagnosis not present

## 2017-11-28 DIAGNOSIS — Z85828 Personal history of other malignant neoplasm of skin: Secondary | ICD-10-CM | POA: Diagnosis not present

## 2017-11-28 DIAGNOSIS — Z08 Encounter for follow-up examination after completed treatment for malignant neoplasm: Secondary | ICD-10-CM | POA: Diagnosis not present

## 2017-12-25 ENCOUNTER — Other Ambulatory Visit: Payer: Medicare Other

## 2017-12-31 NOTE — Progress Notes (Signed)
[place]  [phone]) [phone] Fax:(336) [phone]     ID: Alexis [name] DOB: [birth year]  MR#: [id number]  YDX#:[id number]  Patient Care Team: Alexis [name] as PCP - General (Pulmonary Disease) Alexis [name] as Consulting Physician (General Surgery) Alexis [name] as Consulting Physician (Oncology) Alexis [name] as Consulting Physician (Radiation Oncology) Alexis [name] as Consulting Physician (Neurosurgery) Alexis [name] (Dermatology) Alexis [name] as Consulting Physician (Podiatry) PCP: Alexis [name] OTHER [name]:  CHIEF COMPLAINT: estrogen receptor positive breast cancer  CURRENT TREATMENT:  Tamoxifen   BREAST CANCER HISTORY: From the original intake note:  Alexis [name] had routine mammographic screening with tomography at the [facility] 11/04/2015. This showed an area of distortion and calcifications in the right breast.  Right diagnostic mammography with tomography and right breast ultrasonography at the [facility] 11/15/2015 on the breast density to be categoryC.  In the upper-outer quadrant of the right breast there was an area of distortion associated with pleomorphic calcifications measuring approximately 1 cm. On exam there was no palpable mass.  By ultrasonography, there was an irregular hypoechoic mass superiorly measuring 1.2 cm. Ultrasound of the axilla was benign.  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  Her subsequent history is as detailed below  INTERVAL HISTORY: Alexis [name] returns today for follow-up and treatment of her estrogen receptor positive breast cancer. She continues on tamoxifen, with good tolerance. She  has occasional hot flashes that are not intense. She denies issues with increase vaginal wetness.   Since her last visit, she underwent diagnostic bilateral mammography with CAD and tomography on 11/07/2017 at [facility] [place] showing: breast density category D. There was no evidence of malignancy.   REVIEW OF SYSTEMS: Alexis [name] reports that she has been busy working in her yard. She walks and stays busy for exercise. She has a step counter, but she hasn't used it in a while. She notes that she sounds hoarse, but she denies having a cold. She has occasional general coughing with no particular cause. She has been taking iron supplementation for iron deficiency. She denies unusual headaches, visual changes, nausea, vomiting, or dizziness. There has been no unusual cough, phlegm production, or pleurisy. This been no change in bowel or bladder habits. She denies unexplained fatigue or unexplained weight loss, bleeding, rash, or fever. A detailed review of systems was otherwise stable.   PAST MEDICAL HISTORY: Past Medical History:  Diagnosis Date  . Breast cancer ([place]) 11/16/2015   Right Breast  . Chronic fatigue   . Cough   . GERD (gastroesophageal reflux disease)   . Heart murmur    "years ago, when I was young"  . Hemangioma   . History of hiatal hernia   . Hyperlipidemia   . IBS (irritable bowel syndrome)   . Iron deficiency anemia   . Kyphosis of thoracic region   . MVP (mitral valve prolapse)   . Osteopenia   . SAH (subarachnoid hemorrhage) ([place])   . Vertigo     PAST SURGICAL HISTORY: Past Surgical History:  Procedure Laterality Date  . APPENDECTOMY    . BREAST BIOPSY Right 11/16/2015   U/S Core- Malignant  . BREAST BIOPSY Left 06/21/2005   Stereo-  Benign  . BREAST EXCISIONAL BIOPSY Left 2007  . BREAST LUMPECTOMY Right 12/22/2015  . BREAST LUMPECTOMY WITH RADIOACTIVE SEED AND SENTINEL LYMPH NODE BIOPSY Right 12/22/2015   Procedure: BREAST LUMPECTOMY WITH RADIOACTIVE SEED AND  SENTINEL LYMPH NODE BIOPSY;  Surgeon: Alexis [name];  Location: [place];  Service: General;  Laterality: Right;  . FOOT SURGERY Right    bunionectomy  . [facility]  2003   s/p embolization per Alexis [name]  . TONSILLECTOMY    . TOTAL HIP ARTHROPLASTY Left 01/11/2017   Procedure: LEFT TOTAL HIP ARTHROPLASTY ANTERIOR APPROACH;  Surgeon: Alexis [name];  Location: [place];  Service: Orthopedics;  Laterality: Left;  . TUBAL LIGATION      FAMILY HISTORY Family History  Problem Relation Age of Onset  . Hyperlipidemia Mother   . Breast cancer Mother 14  . Heart attack Father   . Diabetes Brother   . Breast cancer Maternal Aunt   . Breast cancer Paternal Aunt 75  the patient's father died from a ruptured aneurysm at the age of 62. The patient's mother was diagnosed with breast cancer at the age of 64 and died at age 4. The patient has a paternal aunt diagnosed with breast cancer at age 22. The patient has 2 brothers, 2 sisters. Ne brother has had a history of aneurysms, as has the patient There is no history of ovarian cancer in the family.  GYNECOLOGIC HISTORY:  No LMP recorded. Patient is postmenopausal. Menarche age 62, first live birth age 31, the patient is [place] P2. She stopped having periods at age 48.He did not take hormone replacement. 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 Daughter [name] lives in [place] where she works as an Medical illustrator. (Adopted) son Alexis [name] lives in [place] where he is a Museum/gallery curator. 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 The patient has 3 grandchildren aged 23, 47, and 61. She attends a [employer]    ADVANCED DIRECTIVES: not in place   HEALTH MAINTENANCE: Social History   Tobacco Use  . Smoking status: Never  Smoker  . Smokeless tobacco: Never Used  Substance Use Topics  . Alcohol use: No  . Drug use: No     Colonoscopy: 2012/[facility]  PAP:2012  Bone density: 2015  Lipid panel:  Allergies  Allergen Reactions  . Other Itching    Jell-o  . Latex Rash    Contact rash    Current Outpatient Medications  Medication Sig Dispense Refill  . B Complex-C (SUPER B COMPLEX/VITAMIN C PO) Take 1 tablet by mouth daily.    . Calcium Carbonate-Vitamin D (CALCIUM 600/VITAMIN D PO) Take 1 tablet by mouth daily.    . clindamycin (CLEOCIN) 300 MG capsule Take 600 mg by mouth See admin instructions. Takes 2 capsules 1 hour prior to dental procedures    . famotidine (PEPCID) 20 MG tablet Take 20 mg by mouth daily.    . ferrous sulfate 325 (65 FE) MG tablet TAKE 1 TABLET (325 MG TOTAL) 3 (THREE) TIMES DAILY WITH MEALS BY MOUTH.. 270 tablet 1  . loratadine (CLARITIN) 10 MG tablet Take 10 mg by mouth daily.    . pantoprazole (PROTONIX) 40 MG tablet TAKE 1 TABLET BY MOUTH EVERY DAY 90 tablet 1  . pravastatin (PRAVACHOL) 40  MG tablet TAKE 1 TABLET BY MOUTH IN THE EVENING 90 tablet 3  . tamoxifen (NOLVADEX) 20 MG tablet Take 1 tablet (20 mg total) by mouth daily. (Patient taking differently: Take 20 mg by mouth at bedtime. ) 90 tablet 4   No current facility-administered medications for this visit.     OBJECTIVE: middle-aged white woman who appears well  Vitals:   01/01/18 1238  BP: (!) 142/61  Pulse: (!) 51  Resp: 19  Temp: 98.2 F (36.8 C)  SpO2: 100%     Body mass index is 19.34 kg/m.    ECOG FS:1 - Symptomatic but completely ambulatory  Sclerae unicteric, pupils round and equal Oropharynx clear and moist No cervical or supraclavicular adenopathy Lungs no rales or rhonchi Heart regular rate and rhythm Abd soft, nontender, positive bowel sounds MSK no focal spinal tenderness, no upper extremity lymphedema Neuro: nonfocal, well oriented, appropriate affect Breasts: She has undergone right breast  lumpectomy followed by radiation; left breast is unremarkable.  Both axillae are benign.  LAB RESULTS:  CMP     Component Value Date/Time   NA 141 01/01/2018 1156   NA 144 12/26/2016 1105   K 3.7 01/01/2018 1156   K 4.0 12/26/2016 1105   CL 106 01/01/2018 1156   CO2 30 01/01/2018 1156   CO2 29 12/26/2016 1105   GLUCOSE 94 01/01/2018 1156   GLUCOSE 92 12/26/2016 1105   GLUCOSE 106 (H) 05/24/2006 0950   BUN 15 01/01/2018 1156   BUN 12.2 12/26/2016 1105   CREATININE 0.79 01/01/2018 1156   CREATININE 0.7 12/26/2016 1105   CALCIUM 9.5 01/01/2018 1156   CALCIUM 9.7 12/26/2016 1105   PROT 6.8 01/01/2018 1156   PROT 6.7 12/26/2016 1105   ALBUMIN 4.1 01/01/2018 1156   ALBUMIN 3.7 12/26/2016 1105   AST 27 01/01/2018 1156   AST 22 12/26/2016 1105   ALT 24 01/01/2018 1156   ALT 17 12/26/2016 1105   ALKPHOS 42 01/01/2018 1156   ALKPHOS 49 12/26/2016 1105   BILITOT 0.5 01/01/2018 1156   BILITOT 0.57 12/26/2016 1105   GFRNONAA >60 01/01/2018 1156   GFRAA >60 01/01/2018 1156    INo results found for: SPEP, UPEP  Lab Results  Component Value Date   WBC 4.4 06/05/2017   NEUTROABS 2.6 06/05/2017   HGB 12.3 06/05/2017   HCT 37.4 06/05/2017   MCV 84.2 06/05/2017   PLT 157.0 06/05/2017      Chemistry      Component Value Date/Time   NA 141 01/01/2018 1156   NA 144 12/26/2016 1105   K 3.7 01/01/2018 1156   K 4.0 12/26/2016 1105   CL 106 01/01/2018 1156   CO2 30 01/01/2018 1156   CO2 29 12/26/2016 1105   BUN 15 01/01/2018 1156   BUN 12.2 12/26/2016 1105   CREATININE 0.79 01/01/2018 1156   CREATININE 0.7 12/26/2016 1105      Component Value Date/Time   CALCIUM 9.5 01/01/2018 1156   CALCIUM 9.7 12/26/2016 1105   ALKPHOS 42 01/01/2018 1156   ALKPHOS 49 12/26/2016 1105   AST 27 01/01/2018 1156   AST 22 12/26/2016 1105   ALT 24 01/01/2018 1156   ALT 17 12/26/2016 1105   BILITOT 0.5 01/01/2018 1156   BILITOT 0.57 12/26/2016 1105       No results found for:  LABCA2  No components found for: LABCA125  No results for input(s): INR in the last 168 hours.  Urinalysis    Component Value Date/Time  COLORURINE STRAW (A) 01/01/2017 1630   APPEARANCEUR CLEAR 01/01/2017 1630   LABSPEC 1.013 01/01/2017 1630   PHURINE 6.0 01/01/2017 1630   GLUCOSEU NEGATIVE 01/01/2017 1630   GLUCOSEU NEGATIVE 04/21/2015 0932   HGBUR NEGATIVE 01/01/2017 1630   BILIRUBINUR NEGATIVE 01/01/2017 1630   KETONESUR NEGATIVE 01/01/2017 1630   PROTEINUR NEGATIVE 01/01/2017 1630   UROBILINOGEN 0.2 04/21/2015 0932   NITRITE NEGATIVE 01/01/2017 1630   LEUKOCYTESUR NEGATIVE 01/01/2017 1630      ELIGIBLE FOR AVAILABLE RESEARCH PROTOCOL: no  STUDIES: Since her last visit, she underwent diagnostic bilateral mammography with CAD and tomography on 11/07/2017 at [place] showing: breast density category D. There was no evidence of malignancy.   ASSESSMENT: 67 y.o. [place], [place] woman status post right breast upper outer quadrant biopsy 11/16/2015 for a clinical T1b N0, stage IA invasive ductal carcinoma, grade 1, estrogen receptor positive, progesterone receptor negative, HER-2 nonamplified, with an MIB-1 of 5%.  (1) status post right lumpectomy 12/22/2015 for a pT1c pN0, stage IA  invasive ductal carcinoma, grade 1, repeat HER-2 again negative  (2) Oncotype DX score of 21 predicts a risk of recurrence outside the breast over the next 10 years of 14% if the patient's only systemic therapy is tamoxifen for 5 years. It predicts a benefit from chemotherapy in the 3-4% range.  (3) adjuvant radiation 02/06/2016 through 03/05/2016 Site/dose:   The patient initially received a dose of 42.5 Gy in 17 fractions to the breast using whole-breast tangent fields. This was delivered using a 3-D conformal technique. The patient then received a boost to the seroma. This delivered an additional 7.5 Gy in 3 fractions using an en face electron field due to the depth of the seroma. The  total dose was 50 Gy.   (4) Started tamoxifen first week in October 2017  (a) bone density at [facility] 04/16/2014 found osteopenia with a T score of -1.6  (5) the patient does not meet criteria for genetics testing  (6) extremely dense breasts: Consider yearly MRI in addition to mammography   PLAN: Alexis [name] is now 2 years out from definitive surgery for her breast cancer with no evidence of disease recurrence.  This is very favorable.  She is tolerating tamoxifen well and the plan is to continue that a minimum of 5 years.  I think the sore throat and cough she has is likely related to reflux, but just in case we are obtaining a chest x-ray which I think will be reassuring for her.  I expected to be benign.  She has stopped her iron supplementation and would like her iron checked.  I was glad to add those labs for her today.  We went over her mammography which shows density category D.  This means her mammograms are really not very informative.  There is a new indication now for patients with very dense breasts and a history of breast cancer who may receive also a breast MRI on a yearly basis.  I have gone ahead and placed the order for her.  I have alerted her to possible cost issues.  If it is too expensive she will simply cancel that test.  Otherwise she will return to see me in 1 year.  She knows to call for any problems that may develop before that visit.    Alexis [name]  01/01/18 12:58 PM Medical Oncology and Hematology [facility] [address] [place], [place] [address] Tel. [phone]    Fax.  [phone]  Alice Rieger, am acting as scribe for Chauncey Cruel [name].  I, Lurline Del [name], have reviewed the above documentation for accuracy and completeness, and I agree with the above.

## 2018-01-01 ENCOUNTER — Ambulatory Visit (HOSPITAL_COMMUNITY)
Admission: RE | Admit: 2018-01-01 | Discharge: 2018-01-01 | Disposition: A | Discharge status: Home / Self Care | Payer: Medicare Other | Source: Ambulatory Visit | Attending: Oncology | Admitting: Oncology

## 2018-01-01 ENCOUNTER — Inpatient Hospital Stay: Payer: Medicare Other | Attending: Oncology | Admitting: Oncology

## 2018-01-01 ENCOUNTER — Inpatient Hospital Stay: Payer: Medicare Other

## 2018-01-01 VITALS — BP 142/61 | HR 51 | Temp 98.2°F | Resp 19 | Ht 65.25 in | Wt 117.1 lb

## 2018-01-01 DIAGNOSIS — R490 Dysphonia: Secondary | ICD-10-CM

## 2018-01-01 DIAGNOSIS — C50411 Malignant neoplasm of upper-outer quadrant of right female breast: Secondary | ICD-10-CM | POA: Diagnosis not present

## 2018-01-01 DIAGNOSIS — K219 Gastro-esophageal reflux disease without esophagitis: Secondary | ICD-10-CM | POA: Diagnosis not present

## 2018-01-01 DIAGNOSIS — Z170 Estrogen receptor positive status [ER+]: Secondary | ICD-10-CM | POA: Diagnosis not present

## 2018-01-01 DIAGNOSIS — R0602 Shortness of breath: Secondary | ICD-10-CM | POA: Diagnosis not present

## 2018-01-01 DIAGNOSIS — R922 Inconclusive mammogram: Secondary | ICD-10-CM

## 2018-01-01 DIAGNOSIS — Z79899 Other long term (current) drug therapy: Secondary | ICD-10-CM | POA: Insufficient documentation

## 2018-01-01 DIAGNOSIS — R232 Flushing: Secondary | ICD-10-CM | POA: Insufficient documentation

## 2018-01-01 DIAGNOSIS — Z923 Personal history of irradiation: Secondary | ICD-10-CM | POA: Insufficient documentation

## 2018-01-01 DIAGNOSIS — R918 Other nonspecific abnormal finding of lung field: Secondary | ICD-10-CM | POA: Insufficient documentation

## 2018-01-01 DIAGNOSIS — M8580 Other specified disorders of bone density and structure, unspecified site: Secondary | ICD-10-CM | POA: Insufficient documentation

## 2018-01-01 DIAGNOSIS — E785 Hyperlipidemia, unspecified: Secondary | ICD-10-CM

## 2018-01-01 DIAGNOSIS — R05 Cough: Secondary | ICD-10-CM | POA: Diagnosis not present

## 2018-01-01 DIAGNOSIS — Z803 Family history of malignant neoplasm of breast: Secondary | ICD-10-CM | POA: Diagnosis not present

## 2018-01-01 DIAGNOSIS — Z79810 Long term (current) use of selective estrogen receptor modulators (SERMs): Secondary | ICD-10-CM | POA: Insufficient documentation

## 2018-01-01 LAB — COMPREHENSIVE METABOLIC PANEL
ALBUMIN: 4.1 g/dL (ref 3.5–5.0)
ALT: 24 U/L (ref 0–44)
AST: 27 U/L (ref 15–41)
Alkaline Phosphatase: 42 U/L (ref 38–126)
Anion gap: 5 (ref 5–15)
BUN: 15 mg/dL (ref 8–23)
CHLORIDE: 106 mmol/L (ref 98–111)
CO2: 30 mmol/L (ref 22–32)
CREATININE: 0.79 mg/dL (ref 0.44–1.00)
Calcium: 9.5 mg/dL (ref 8.9–10.3)
GFR calc Af Amer: >60 mL/min (ref >60)
GLUCOSE: 94 mg/dL (ref 70–99)
POTASSIUM: 3.7 mmol/L (ref 3.5–5.1)
Sodium: 141 mmol/L (ref 135–145)
Total Bilirubin: 0.5 mg/dL (ref 0.3–1.2)
Total Protein: 6.8 g/dL (ref 6.5–8.1)

## 2018-01-01 LAB — CBC WITH DIFFERENTIAL/PLATELET
Basophils Absolute: 0 10*3/uL (ref 0.0–0.1)
Basophils Relative: 0 %
EOS PCT: 2 %
Eosinophils Absolute: 0.1 10*3/uL (ref 0.0–0.5)
HCT: 34.8 % (ref 34.8–46.6)
Hemoglobin: 11.5 g/dL — ABNORMAL LOW (ref 11.6–15.9)
LYMPHS ABS: 1.5 10*3/uL (ref 0.9–3.3)
LYMPHS PCT: 29 %
MCH: 30 pg (ref 25.1–34.0)
MCHC: 33 g/dL (ref 31.5–36.0)
MCV: 90.9 fL (ref 79.5–101.0)
MONO ABS: 0.5 10*3/uL (ref 0.1–0.9)
MONOS PCT: 10 %
Neutro Abs: 3 10*3/uL (ref 1.5–6.5)
Neutrophils Relative %: 59 %
Platelets: 149 10*3/uL (ref 145–400)
RBC: 3.83 MIL/uL (ref 3.70–5.45)
RDW: 12.8 % (ref 11.2–14.5)
WBC: 5.1 10*3/uL (ref 3.9–10.3)

## 2018-01-01 LAB — IRON AND TIBC
Iron: 111 ug/dL (ref 41–142)
SATURATION RATIOS: 37 % (ref 21–57)
TIBC: 303 ug/dL (ref 236–444)
UIBC: 191 ug/dL

## 2018-01-01 LAB — FERRITIN: Ferritin: 63 ng/mL (ref 11–307)

## 2018-01-01 MED ORDER — TAMOXIFEN CITRATE 20 MG PO TABS: 20.0000 mg | ORAL_TABLET | Freq: Every day | ORAL | 4 refills | Status: DC

## 2018-01-02 ENCOUNTER — Telehealth: Payer: Self-pay | Admitting: Oncology

## 2018-01-02 NOTE — Telephone Encounter (Signed)
Mail letter per 7/17 los

## 2018-01-08 DIAGNOSIS — Z124 Encounter for screening for malignant neoplasm of cervix: Secondary | ICD-10-CM | POA: Diagnosis not present

## 2018-01-09 DIAGNOSIS — Z124 Encounter for screening for malignant neoplasm of cervix: Secondary | ICD-10-CM | POA: Diagnosis not present

## 2018-01-13 ENCOUNTER — Ambulatory Visit
Admission: RE | Admit: 2018-01-13 | Discharge: 2018-01-13 | Disposition: A | Discharge status: Home / Self Care | Payer: Medicare Other | Source: Ambulatory Visit | Attending: Oncology | Admitting: Oncology

## 2018-01-13 DIAGNOSIS — R922 Inconclusive mammogram: Secondary | ICD-10-CM

## 2018-01-13 DIAGNOSIS — Z170 Estrogen receptor positive status [ER+]: Principal | ICD-10-CM

## 2018-01-13 DIAGNOSIS — Z853 Personal history of malignant neoplasm of breast: Secondary | ICD-10-CM | POA: Diagnosis not present

## 2018-01-13 DIAGNOSIS — C50411 Malignant neoplasm of upper-outer quadrant of right female breast: Secondary | ICD-10-CM

## 2018-01-13 MED ORDER — GADOBENATE DIMEGLUMINE 529 MG/ML IV SOLN
10.0000 mL | Freq: Once | INTRAVENOUS | Status: AC | PRN
  Administered 2018-01-13: 10 mL via INTRAVENOUS

## 2018-01-14 ENCOUNTER — Telehealth: Payer: Self-pay

## 2018-01-14 NOTE — Telephone Encounter (Signed)
Spoke with patient informing of normal MRI. Patient thanked nurse for call.  She asked to find out from Dr. Jana Hakim if she is supposed to see Dr. Lucia Gaskins in 6 months per usual routine.  Nurse explained that MD out of town and will find out next week and let her know.  Patient voiced understanding.

## 2018-01-14 NOTE — Telephone Encounter (Signed)
-----   Message from Gardenia Phlegm, NP sent at 01/14/2018  8:16 AM EDT ----- Please let patient know her MRI is normal ----- Message ----- From: Interface, Rad Results In Sent: 01/14/2018   8:01 AM To: Chauncey Cruel, MD

## 2018-06-06 ENCOUNTER — Ambulatory Visit (INDEPENDENT_AMBULATORY_CARE_PROVIDER_SITE_OTHER): Payer: Medicare Other | Admitting: Internal Medicine

## 2018-06-06 ENCOUNTER — Encounter: Payer: Self-pay | Admitting: Internal Medicine

## 2018-06-06 VITALS — BP 128/80 | HR 60 | Ht 63.75 in | Wt 116.0 lb

## 2018-06-06 DIAGNOSIS — E785 Hyperlipidemia, unspecified: Secondary | ICD-10-CM | POA: Diagnosis not present

## 2018-06-06 DIAGNOSIS — R05 Cough: Secondary | ICD-10-CM

## 2018-06-06 DIAGNOSIS — D509 Iron deficiency anemia, unspecified: Secondary | ICD-10-CM | POA: Diagnosis not present

## 2018-06-06 DIAGNOSIS — R058 Other specified cough: Secondary | ICD-10-CM

## 2018-06-06 DIAGNOSIS — R002 Palpitations: Secondary | ICD-10-CM | POA: Diagnosis not present

## 2018-06-06 LAB — CBC WITH DIFFERENTIAL/PLATELET
Basophils Absolute: 0 10*3/uL (ref 0.0–0.1)
Basophils Relative: 0.4 % (ref 0.0–3.0)
Eosinophils Absolute: 0.1 10*3/uL (ref 0.0–0.7)
Eosinophils Relative: 1.3 % (ref 0.0–5.0)
HCT: 33.6 % — ABNORMAL LOW (ref 36.0–46.0)
HEMOGLOBIN: 11.4 g/dL — AB (ref 12.0–15.0)
Lymphocytes Relative: 28 % (ref 12.0–46.0)
Lymphs Abs: 1.3 10*3/uL (ref 0.7–4.0)
MCHC: 34 g/dL (ref 30.0–36.0)
MCV: 89.1 fl (ref 78.0–100.0)
Monocytes Absolute: 0.4 10*3/uL (ref 0.1–1.0)
Monocytes Relative: 10 % (ref 3.0–12.0)
Neutro Abs: 2.7 10*3/uL (ref 1.4–7.7)
Neutrophils Relative %: 60.3 % (ref 43.0–77.0)
Platelets: 131 10*3/uL — ABNORMAL LOW (ref 150.0–400.0)
RBC: 3.77 Mil/uL — ABNORMAL LOW (ref 3.87–5.11)
RDW: 13.3 % (ref 11.5–15.5)
WBC: 4.5 10*3/uL (ref 4.0–10.5)

## 2018-06-06 LAB — LIPID PANEL
CHOLESTEROL: 141 mg/dL (ref 0–200)
HDL: 43 mg/dL (ref >39.00)
LDL CALC: 75 mg/dL (ref 0–99)
NonHDL: 97.93
Total CHOL/HDL Ratio: 3
Triglycerides: 114 mg/dL (ref 0.0–149.0)
VLDL: 22.8 mg/dL (ref 0.0–40.0)

## 2018-06-06 LAB — BASIC METABOLIC PANEL
BUN: 14 mg/dL (ref 6–23)
CO2: 31 mEq/L (ref 19–32)
Calcium: 8.8 mg/dL (ref 8.4–10.5)
Chloride: 104 mEq/L (ref 96–112)
Creatinine, Ser: 0.64 mg/dL (ref 0.40–1.20)
GFR: 97.98 mL/min (ref >60.00)
Glucose, Bld: 93 mg/dL (ref 70–99)
POTASSIUM: 3.6 meq/L (ref 3.5–5.1)
Sodium: 141 mEq/L (ref 135–145)

## 2018-06-06 LAB — TSH: TSH: 1.64 u[IU]/mL (ref 0.35–4.50)

## 2018-06-06 LAB — HEPATIC FUNCTION PANEL
ALT: 18 U/L (ref 0–35)
AST: 22 U/L (ref 0–37)
Albumin: 3.8 g/dL (ref 3.5–5.2)
Alkaline Phosphatase: 32 U/L — ABNORMAL LOW (ref 39–117)
BILIRUBIN DIRECT: 0.1 mg/dL (ref 0.0–0.3)
BILIRUBIN TOTAL: 0.7 mg/dL (ref 0.2–1.2)
Total Protein: 6.4 g/dL (ref 6.0–8.3)

## 2018-06-06 NOTE — Progress Notes (Signed)
Subjective:    Patient ID: Alexis [name], female    DOB: December 28, 1950    MRN: [id number]   Brief patient profile:  89 yowf with asym hyperlipidemia followed by [name] for primary care with history of esophageal stricture requiring dilatation and intermittent noct choking sensations better on GERD rx  And h/o fe def anemia secondary to freq blood donation and GRADE I INVASIVE DUCTAL CARCINOMA WITH CALCIFICATIONS, DUCTAL CARCINOMA IN SITU> 12/22/15 R lumpectomy with neg node      History of Present Illness   12/22/15 R lumpectomy with neg nodes / radiation/ nolvadex   12/2016  L  THR  [name]   04/30/2017  f/u ov/Kenika Sahm re:  Hyperlipidemia/ mvp/ chronic rhinitis  Chief Complaint  Patient presents with  . Annual Exam    Pt is fasting. Pt states she has noticed some palpitations recently- last episode was approx 2 wks ago.   palpitations s presyncope  last x 2-3 min once a month  then feels drained ffor the rest of the day "same as in past" Excess mucus x one week with sense of excess pnds but no sneezing or obvious rhinorrhea and this doesn't disturb sleep rec Stop clariton and reduce caffeine use  For drainage / throat tickle try take CHLORPHENIRAMINE  4 mg -prn  Use the ear wax remover on your Left ear (over the counter)      06/06/2018  f/u ov/Janai Maudlin re:   Hyperlipidemia / chronic rhinitis with pnds/cough /mvp with h/o  palpitations Chief Complaint  Patient presents with  . Annual Exam    She is doing well and denies any co's.   Dyspnea: Not limited by breathing from desired activities   Cough: pnds/burning first thing in am  x years no better on clariton - did not try h1 as rec  Sleeping: flat bed     No obvious day to day or daytime variability or assoc excess/ purulent sputum or mucus plugs or hemoptysis or cp or chest tightness, subjective wheeze or overt hb symptoms.   Sleeping  without nocturnal  or early am exacerbation  of respiratory  c/o's or need for noct saba. Also denies any  obvious fluctuation of symptoms with weather or environmental changes or other aggravating or alleviating factors except as outlined above   No unusual exposure hx or h/o childhood pna/ asthma or knowledge of premature birth.  Current Allergies, Complete Past Medical History, Past Surgical History, Family History, and Social History were reviewed in [employer] record.  ROS  The following are not active complaints unless bolded Hoarseness, sore throat, dysphagia, dental problems, itching, sneezing,  nasal congestion or discharge of excess mucus or purulent secretions, ear ache,   fever, chills, sweats, unintended wt loss or wt gain, classically pleuritic or exertional cp,  orthopnea pnd or arm/hand swelling  or leg swelling, presyncope, palpitations, abdominal pain, anorexia, nausea, vomiting, diarrhea  or change in bowel habits or change in bladder habits, change in stools or change in urine, dysuria, hematuria,  rash, arthralgias, visual complaints, headache, numbness, weakness or ataxia or problems with walking or coordination,  change in mood or  memory.        Current Meds  Medication Sig  . aspirin EC 81 MG tablet Take 81 mg by mouth daily.  . B Complex-C (SUPER B COMPLEX/VITAMIN C PO) Take 1 tablet by mouth daily.  . Calcium Carbonate-Vitamin D (CALCIUM 600/VITAMIN D PO) Take 1 tablet by mouth daily.  Marland Kitchen loratadine (CLARITIN)  10 MG tablet Take 10 mg by mouth daily.  . pravastatin (PRAVACHOL) 40 MG tablet TAKE 1 TABLET BY MOUTH IN THE EVENING  . tamoxifen (NOLVADEX) 20 MG tablet Take 1 tablet (20 mg total) by mouth at bedtime.             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  Nic Lampe  - DT 04/21/2015 - Prevnar  04/23/16  - CPX 06/06/2018  - GYN care [name] > says told no longer needed any gyn health maint as per pt at  ov 04/30/2017        Family History:  Diabetes- Brothers  Hyperlipidemia- Mother  MI/Heart Attack- Father  Breast cancer mother and father's sister     Social History:  Patient never smoked.  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 Nl external ear canals without cough reflex   NECK :  without JVD/Nodes/TM/ nl carotid upstrokes bilaterally   LUNGS: no acc muscle use,  Nl contour chest which is clear to A and P bilaterally without cough on insp or exp maneuvers   CV:  RRR  no s3 or murmur or increase in P2, and no edema   ABD:  soft and nontender with nl inspiratory  excursion in the supine position. No bruits or organomegaly appreciated, bowel sounds nl  MS:  Nl gait/ ext warm without deformities, calf tenderness, cyanosis or clubbing No obvious joint restrictions   SKIN: warm and dry without lesions    NEURO:  alert, approp, nl sensorium with  no motor or cerebellar deficits apparent.     EKG   06/06/2018  Sinus brady, o/w wnl   Labs ordered/ reviewed:      Chemistry      Component Value Date/Time   NA 141 06/06/2018 0932   NA 144 12/26/2016 1105   K 3.6 06/06/2018 0932   K  4.0 12/26/2016 1105   CL 104 06/06/2018 0932   CO2 31 06/06/2018 0932   CO2 29 12/26/2016 1105   BUN 14 06/06/2018 0932   BUN 12.2 12/26/2016 1105   CREATININE 0.64 06/06/2018 0932   CREATININE 0.7 12/26/2016 1105      Component Value Date/Time   CALCIUM 8.8 06/06/2018 0932   CALCIUM 9.7 12/26/2016 1105   ALKPHOS 32 (L) 06/06/2018 0932   ALKPHOS 49 12/26/2016 1105   AST 22 06/06/2018 0932   AST 22 12/26/2016 1105   ALT 18 06/06/2018 0932   ALT 17 12/26/2016 1105   BILITOT 0.7 06/06/2018 0932   BILITOT 0.57 12/26/2016 1105        Lab Results  Component Value Date   WBC 4.5 06/06/2018   HGB 11.4 (L) 06/06/2018   HCT 33.6 (L) 06/06/2018   MCV 89.1 06/06/2018   PLT 131.0 (L) 06/06/2018        EOS                                                              0.1                                    06/06/2018       Lab Results  Component Value Date   TSH 1.64 06/06/2018                              Assessment & Plan:

## 2018-06-06 NOTE — Patient Instructions (Addendum)
Pepcid 20 mg one hour before bedtime  For drainage / throat tickle try take CHLORPHENIRAMINE  4 mg (chlortabs at [facility]) - take one every 4 hours as needed - available over the counter- may cause drowsiness so start with just a dose or two  One hour before bedtime and see how you tolerate it before trying in daytime       GERD (REFLUX)  is an extremely common cause of respiratory symptoms just like yours , many times with no obvious heartburn at all.    It can be treated with medication, but also with lifestyle changes including elevation of the head of your bed (ideally with 6 inch  bed blocks),  Smoking cessation, avoidance of late meals, excessive alcohol, and avoid fatty foods, chocolate, peppermint, colas, red wine, and acidic juices such as orange juice.  NO MINT OR MENTHOL PRODUCTS SO NO COUGH DROPS   USE SUGARLESS CANDY INSTEAD (Jolley ranchers or Stover's or Life Savers) or even ice chips will also do - the key is to swallow to prevent all throat clearing. NO OIL BASED VITAMINS - use powdered substitutes.   Please remember to go to the lab department   for your tests - we will call you with the results when they are available.       Please schedule a follow up visit in 3 months but call sooner if needed  with all medications /inhalers/ solutions in hand so we can verify exactly what you are taking. This includes all medications from all doctors and over the counters

## 2018-06-06 NOTE — Progress Notes (Signed)
Spoke with pt and notified of results per Dr. Wert. Pt verbalized understanding and denied any questions. 

## 2018-06-08 ENCOUNTER — Encounter: Payer: Self-pay | Admitting: Internal Medicine

## 2018-06-08 NOTE — Assessment & Plan Note (Signed)
Allergy profile 04/30/2017 >  Eos 0.1 /  IgE 5 with RAST pos dog only     Did not follow recs from prev ov which were reviewed with her today plus rx for possible noct gerd since most of her symptoms are early am   >>> rec Elevate hob Add h2 at hs plus hs 1st gen H1 blockers per guidelines   F/u 3  with all meds in hand using a trust but verify approach to confirm accurate Medication  Reconciliation The principal here is that until we are certain that the  patients are doing what we've asked, it makes no sense to ask them to do more.    I had an extended discussion with the patient reviewing all relevant studies completed to date and  lasting 15 to 20 minutes of a 25 minute visit    Each maintenance medication was reviewed in detail including most importantly the difference between maintenance and prns and under what circumstances the prns are to be triggered using an action plan format that is not reflected in the computer generated alphabetically organized AVS.     Please see AVS for specific instructions unique to this visit that I personally wrote and verbalized to the the pt in detail and then reviewed with pt  by my nurse highlighting any  changes in therapy recommended at today's visit to their plan of care.

## 2018-06-08 NOTE — Assessment & Plan Note (Signed)
-   Target LDL < 160 in absence of risk factors  Lab Results  Component Value Date   CHOL 141 06/06/2018   HDL 43.00 06/06/2018   LDLCALC 75 06/06/2018   LDLDIRECT 151.4 07/04/2010   TRIG 114.0 06/06/2018   CHOLHDL 3 06/06/2018    lfts ok, Adequate control on present rx, reviewed in detail with pt > no change in rx needed  = pravachol 40 mg daily

## 2018-06-08 NOTE — Assessment & Plan Note (Signed)
  Lab Results  Component Value Date   HGB 11.4 (L) 06/06/2018   HGB 11.5 (L) 01/01/2018   HGB 12.3 06/05/2017   HGB 11.7 12/26/2016   HGB 11.7 02/22/2016   HGB 11.6 11/23/2015     Adequate control on present rx, reviewed in detail with pt > no change in rx needed

## 2018-07-09 DIAGNOSIS — C50911 Malignant neoplasm of unspecified site of right female breast: Secondary | ICD-10-CM | POA: Diagnosis not present

## 2018-07-09 DIAGNOSIS — Z006 Encounter for examination for normal comparison and control in clinical research program: Secondary | ICD-10-CM | POA: Diagnosis not present

## 2018-07-15 DIAGNOSIS — K5900 Constipation, unspecified: Secondary | ICD-10-CM | POA: Diagnosis not present

## 2018-07-15 DIAGNOSIS — K644 Residual hemorrhoidal skin tags: Secondary | ICD-10-CM | POA: Diagnosis not present

## 2018-07-15 DIAGNOSIS — K648 Other hemorrhoids: Secondary | ICD-10-CM | POA: Diagnosis not present

## 2018-09-03 ENCOUNTER — Other Ambulatory Visit: Payer: Self-pay | Admitting: Internal Medicine

## 2018-09-09 ENCOUNTER — Ambulatory Visit: Payer: Medicare Other | Admitting: Internal Medicine

## 2018-09-29 ENCOUNTER — Other Ambulatory Visit: Payer: Self-pay | Admitting: Oncology

## 2018-09-29 DIAGNOSIS — Z853 Personal history of malignant neoplasm of breast: Secondary | ICD-10-CM

## 2018-10-27 ENCOUNTER — Encounter: Payer: Self-pay | Admitting: Gastroenterology

## 2018-11-04 ENCOUNTER — Ambulatory Visit: Payer: Medicare Other | Admitting: Internal Medicine

## 2018-11-06 ENCOUNTER — Ambulatory Visit: Payer: Medicare Other | Admitting: Internal Medicine

## 2018-11-13 ENCOUNTER — Ambulatory Visit (INDEPENDENT_AMBULATORY_CARE_PROVIDER_SITE_OTHER): Payer: Medicare Other | Admitting: Internal Medicine

## 2018-11-13 ENCOUNTER — Encounter: Payer: Self-pay | Admitting: Internal Medicine

## 2018-11-13 ENCOUNTER — Other Ambulatory Visit: Payer: Self-pay

## 2018-11-13 DIAGNOSIS — R05 Cough: Secondary | ICD-10-CM

## 2018-11-13 DIAGNOSIS — E785 Hyperlipidemia, unspecified: Secondary | ICD-10-CM

## 2018-11-13 DIAGNOSIS — R058 Other specified cough: Secondary | ICD-10-CM

## 2018-11-13 NOTE — Patient Instructions (Addendum)
Pepcid 20 mg - take x one tablet x one hour before bedtime  For drainage / throat tickle try take CHLORPHENIRAMINE  4 mg (chlortabs at [facility]) - take one every 4 hours as needed - available over the counter- may cause drowsiness so start with just a dose or two  One hour before bedtime and see how you tolerate it before trying in daytime     GERD (REFLUX)  is an extremely common cause of respiratory symptoms just like yours , many times with no obvious heartburn at all.    It can be treated with medication, but also with lifestyle changes including elevation of the head of your bed (ideally with 6 inch  bed blocks),  Smoking cessation, avoidance of late meals, excessive alcohol, and avoid fatty foods, chocolate, peppermint, colas, red wine, and acidic juices such as orange juice.  NO MINT OR MENTHOL PRODUCTS SO NO COUGH DROPS   USE SUGARLESS CANDY INSTEAD (Jolley ranchers or Stover's or Life Savers) or even ice chips will also do - the key is to swallow to prevent all throat clearing. NO OIL BASED VITAMINS - use powdered substitutes.   cpx due 05/2019

## 2018-11-13 NOTE — Progress Notes (Signed)
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 Annual Exam    Pt is fasting. Pt states she has noticed some palpitations recently- last episode was approx 2 wks ago.   palpitations s presyncope  last x 2-3 min once a month  then feels drained ffor the rest of the day "same as in past" Excess mucus x one week with sense of excess pnds but no sneezing or obvious rhinorrhea and this doesn't disturb sleep rec Stop clariton and reduce caffeine use  For drainage / throat tickle try take CHLORPHENIRAMINE  4 mg -prn  Use the ear wax remover on your Left ear (over the counter)      06/06/2018  f/u ov/Alexis [name] re:   Hyperlipidemia / chronic rhinitis with pnds/cough /mvp with h/o  palpitations Chief Complaint  Patient presents with  . Annual Exam    She is doing well and denies any co's.   Dyspnea: Not limited by breathing from desired activities   Cough: pnds/burning first thing in am  x years no better on clariton - did not try h1 as rec  Sleeping: flat bed  rec Pepcid 20 mg one hour before bedtime For drainage / throat tickle try take CHLORPHENIRAMINE  4 mg ([facility]ortabs at [facility]) - take one every 4 hours as needed - available over the counter- may cause drowsiness so start with just a dose or two  One hour before bedtime and see how you tolerate  it before trying in daytime   GERD  Diet    11/13/2018  f/u ov/Alexis [name] re: hyperlipidemia / chornic rhinitis with pnds vs grerd  Chief Complaint  Patient presents with  . Follow-up    Doing well, has occ burning in her throat but this has been better after cutting out some caffeine.    Dyspnea:  Not limited by breathing from desired activities   Cough: am burning still present, not as often while  on h1 one H1 (never tried two as rec) , no h2  Sleeping: fine flat SABA use: none 02: none    No obvious day to day or daytime variability or assoc excess/ purulent sputum or mucus plugs or hemoptysis or cp or chest tightness, subjective wheeze or overt  hb symptoms.   Sleeping  without nocturnal  or early am exacerbation  of respiratory  c/o's or need for noct saba. Also denies any obvious fluctuation of symptoms with weather or environmental changes or other aggravating or alleviating factors except as outlined above   No unusual exposure hx or h/o childhood pna/ asthma or knowledge of premature birth.  Current Allergies, Complete Past Medical History, Past Surgical History, Family History, and Social History were reviewed in [employer] record.  ROS  The following are not active complaints  unless bolded Hoarseness, sore throat, dysphagia, dental problems, itching, sneezing,  nasal congestion or discharge of excess mucus or purulent secretions, ear ache,   fever, chills, sweats, unintended wt loss or wt gain, classically pleuritic or exertional cp,  orthopnea pnd or arm/hand swelling  or leg swelling, presyncope, palpitations, abdominal pain, anorexia, nausea, vomiting, diarrhea  or change in bowel habits or change in bladder habits, change in stools or change in urine, dysuria, hematuria,  rash, arthralgias, visual complaints, headache, numbness, weakness or ataxia or problems with walking or coordination,  change in mood or  memory.        Current Meds  Medication Sig  .  aspirin EC 81 MG tablet Take 81 mg by mouth daily.  . B Complex-C (SUPER B COMPLEX/VITAMIN C PO) Take 1 tablet by mouth daily.  . Calcium Carbonate-Vitamin D (CALCIUM 600/VITAMIN D PO) Take 1 tablet by mouth daily.  Marland Kitchen docusate sodium (COLACE) 100 MG capsule Take 100 mg by mouth 2 (two) times daily.  . pravastatin (PRAVACHOL) 40 MG tablet TAKE 1 TABLET BY MOUTH IN THE EVENING  . psyllium (REGULOID) 0.52 g capsule Take 0.52 g by mouth daily.  . tamoxifen (NOLVADEX) 20 MG tablet Take 1 tablet (20 mg total) by mouth at bedtime.     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  Alexis [name]  - DT 04/21/2015 - Prevnar  04/23/16  - CPX 06/06/2018  - GYN care Alexis [name] > says told no longer needed any gyn health maint as per pt at  ov 04/30/2017        Family History:  Diabetes- Brothers  Hyperlipidemia- Mother  MI/Heart Attack- Father  Breast cancer mother and father's sister     Social History:  Patient never smoked.  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 Nl external ear canals without cough reflex   NECK :  without JVD/Nodes/TM/ nl carotid upstrokes bilaterally   LUNGS: no acc muscle use,  Nl contour chest which is clear to A and P bilaterally without cough on insp or exp maneuvers   CV:  RRR  no s3 or murmur or increase in P2, and no edema   ABD:  soft and nontender with nl inspiratory excursion in the supine position. No bruits or organomegaly appreciated, bowel sounds nl  MS:  Nl gait/ ext warm without deformities, calf tenderness, cyanosis or clubbing No obvious joint restrictions   SKIN: warm and dry without lesions    NEURO:  alert, approp, nl sensorium with  no motor or cerebellar deficits apparent.                                      Assessment & [name]:

## 2018-11-14 ENCOUNTER — Other Ambulatory Visit: Payer: Self-pay

## 2018-11-14 ENCOUNTER — Ambulatory Visit
Admission: RE | Admit: 2018-11-14 | Discharge: 2018-11-14 | Disposition: A | Discharge status: Home / Self Care | Payer: Medicare Other | Source: Ambulatory Visit | Attending: Oncology | Admitting: Oncology

## 2018-11-14 DIAGNOSIS — Z853 Personal history of malignant neoplasm of breast: Secondary | ICD-10-CM

## 2018-11-14 DIAGNOSIS — R922 Inconclusive mammogram: Secondary | ICD-10-CM | POA: Diagnosis not present

## 2018-11-16 NOTE — Assessment & Plan Note (Signed)
-   Target LDL < 160 in absence of risk factors  Labs reviewed, at goal s side effects > Adequate control on present rx, reviewed in detail with pt > no change in rx needed

## 2018-11-16 NOTE — Assessment & Plan Note (Signed)
Onset in her early 31s Allergy profile 04/30/2017 >  Eos 0.1 /  IgE 5 with RAST pos dog only  - 06/06/2018 rec 1st gen H1 blockers per guidelines    Better but not resolved p 1st gen H1 blockers x one at hs though instructions were to titrate and add H2 = pepcid if not better.   rec 1st gen H1 blockers per guidelines  And add pepcic 20 mg hs plus went over diet again in detain in writing   I had an extended discussion with the patient reviewing all relevant studies completed to date and  lasting 15 to 20 minutes of a 25 minute visit    Each maintenance medication was reviewed in detail including most importantly the difference between maintenance and prns and under what circumstances the prns are to be triggered using an action plan format that is not reflected in the computer generated alphabetically organized AVS.     Please see AVS for specific instructions unique to this visit that I personally wrote and verbalized to the the pt in detail and then reviewed with pt  by my nurse highlighting any  changes in therapy recommended at today's visit to their plan of care.

## 2018-11-17 IMAGING — MG DIGITAL DIAGNOSTIC BILATERAL MAMMOGRAM WITH TOMO AND CAD
9 series · 9 of 25 positions shown · non-contrast
Comparison: Previous exam(s).

CLINICAL DATA: Status post right lumpectomy and radiation therapy
for breast cancer in 6325. The patient has also had nonfocal pain in
the lateral right breast extending to the 12 o'clock position for
the past 2-3 months. No palpable abnormality.

EXAM:
DIGITAL DIAGNOSTIC BILATERAL MAMMOGRAM WITH CAD AND TOMO

[R CC]
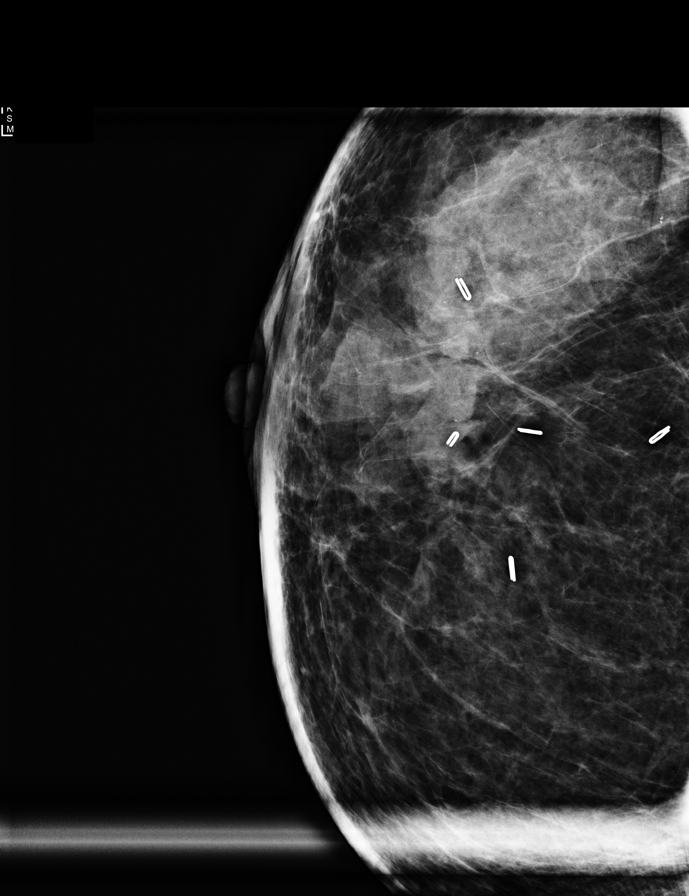

[L CC synth-2D]
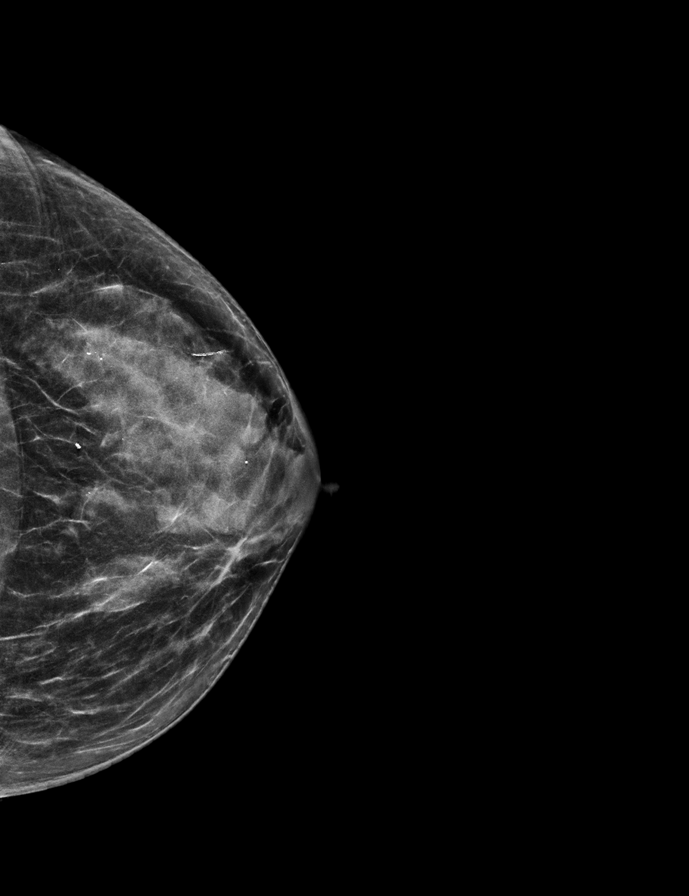

[L MLO synth-2D]
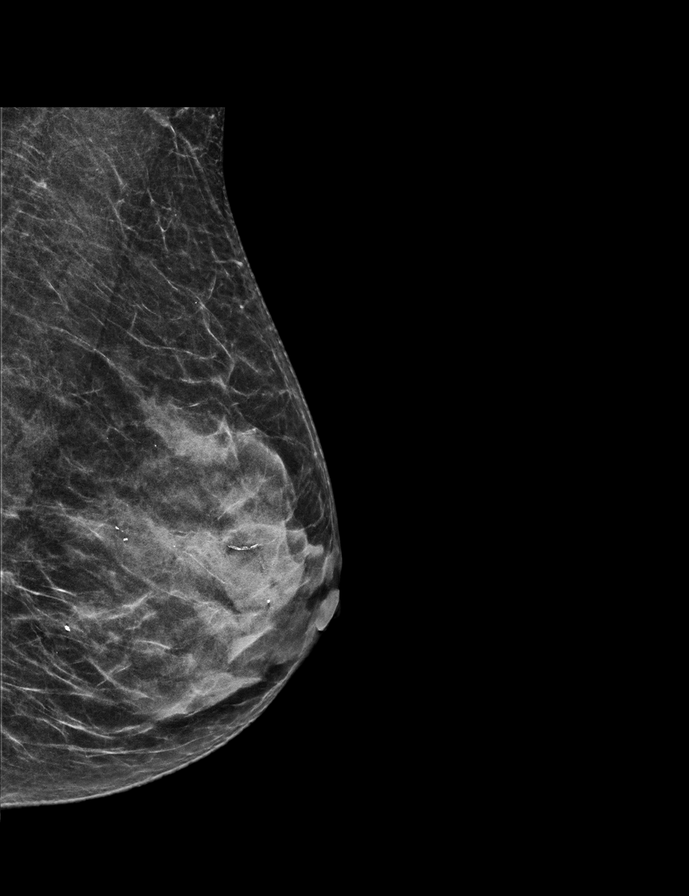

[R MLO synth-2D]
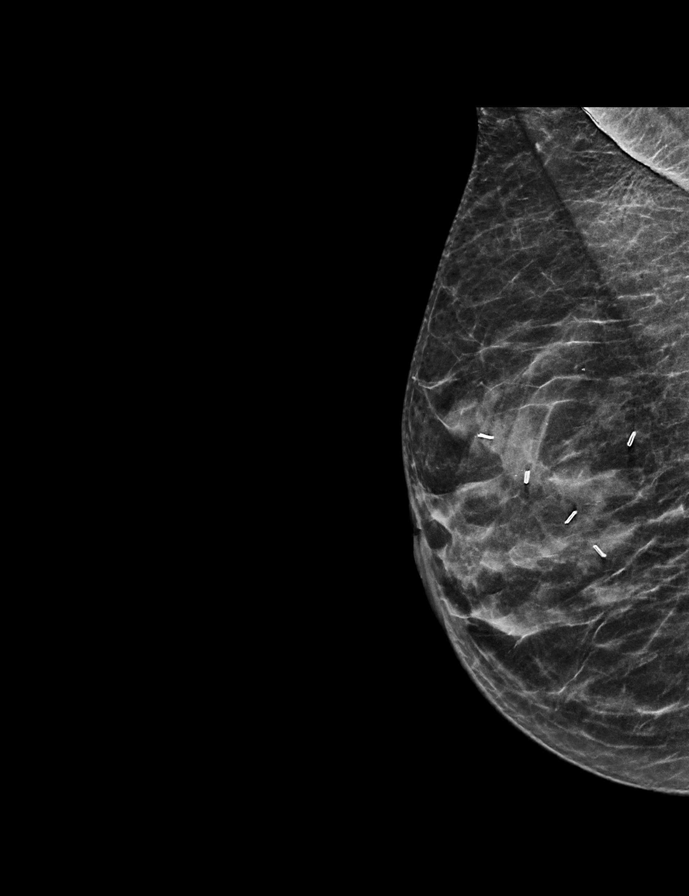

[R CC synth-2D]
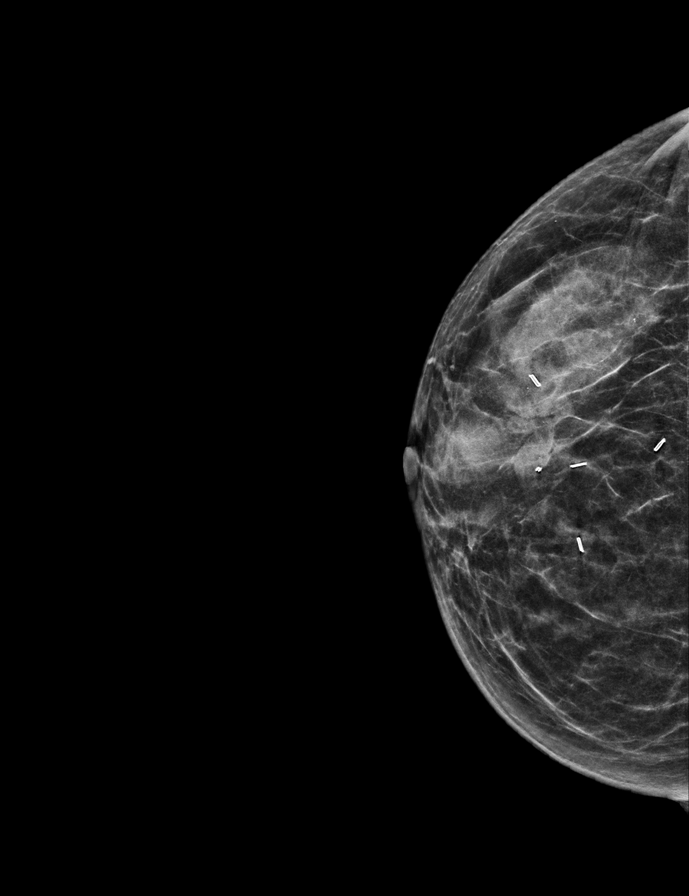

[L CC tomo · tomo slice 27/54.0]
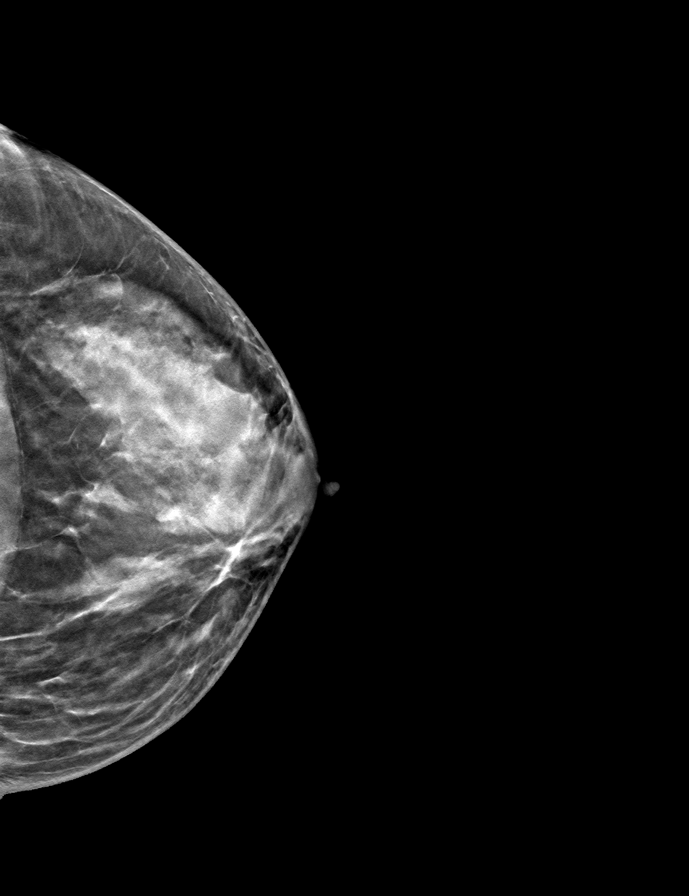

[L MLO tomo · tomo slice 25/50.0]
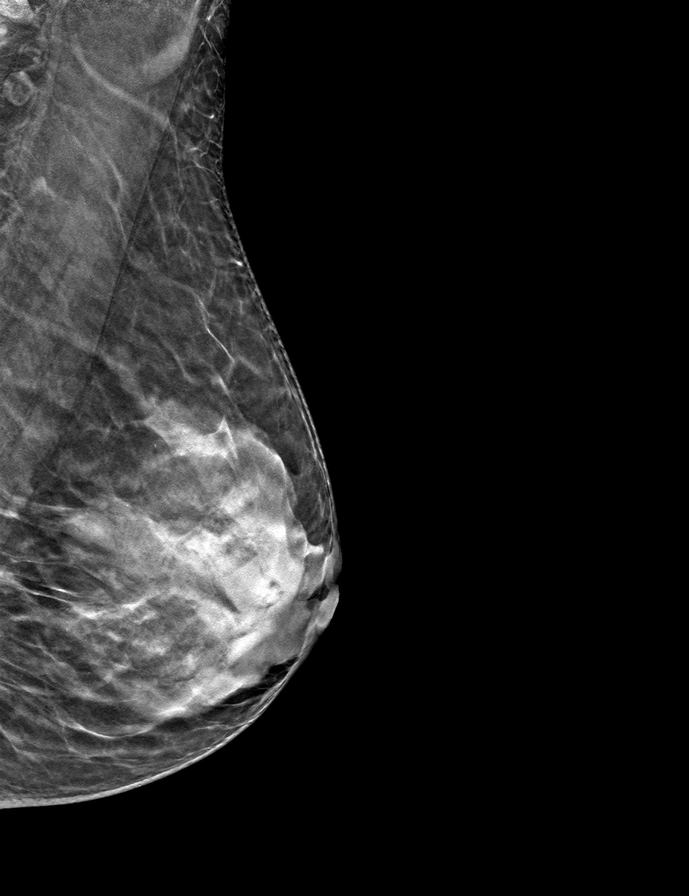

[R MLO tomo · tomo slice 27/53.0]
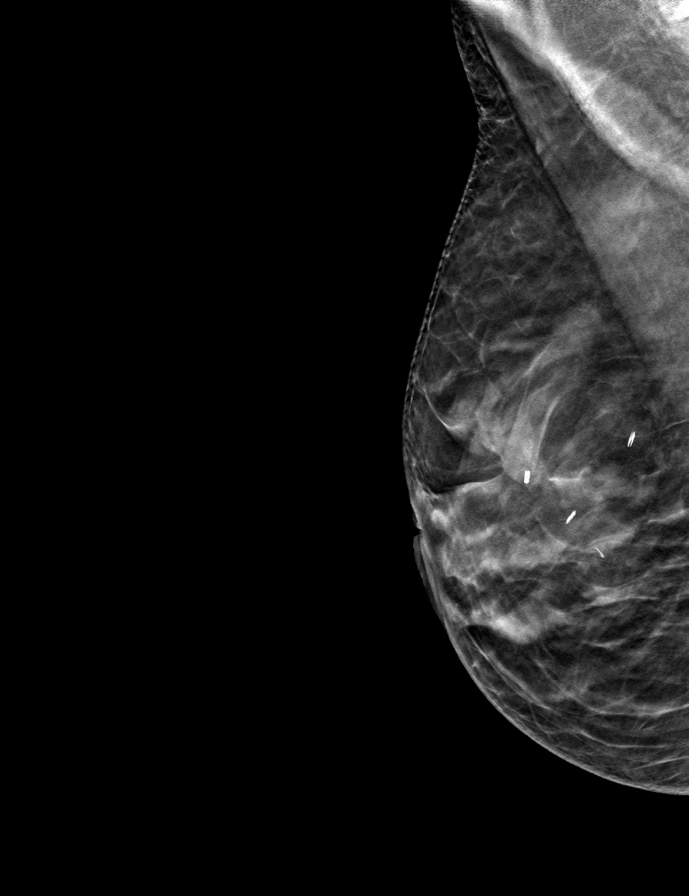

[R CC tomo · tomo slice 27/53.0]
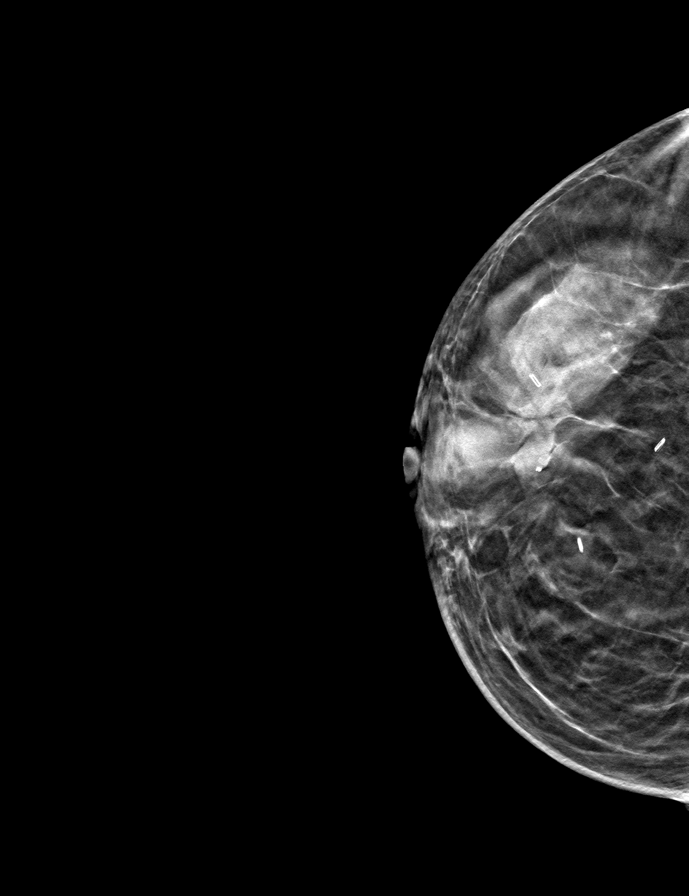

[9 of 25 positions shown; findings below may reference images not displayed]

ACR Breast Density Category d: The breast tissue is extremely dense,
which lowers the sensitivity of mammography.
FINDINGS: Stable post lumpectomy changes on the right with no interval
findings suspicious for malignancy in either breast.

Mammographic images were processed with CAD.
IMPRESSION: No evidence of malignancy.

RECOMMENDATION:
Bilateral diagnostic mammogram in 1 year.

I have discussed the findings and recommendations with the patient.
Results were also provided in writing at the conclusion of the
visit. If applicable, a reminder letter will be sent to the patient
regarding the next appointment.

BI-RADS CATEGORY  2: Benign.

## 2018-11-30 ENCOUNTER — Other Ambulatory Visit: Payer: Self-pay | Admitting: Oncology

## 2018-12-16 ENCOUNTER — Telehealth: Payer: Self-pay | Admitting: Oncology

## 2018-12-16 NOTE — Telephone Encounter (Signed)
GM PAL 7/20 moved f/u to 7/31 as webex. Left message. Schedule mailed.

## 2018-12-29 ENCOUNTER — Ambulatory Visit: Payer: Medicare Other | Admitting: Internal Medicine

## 2019-01-05 ENCOUNTER — Other Ambulatory Visit: Payer: Medicare Other

## 2019-01-05 ENCOUNTER — Ambulatory Visit: Payer: Medicare Other | Admitting: Oncology

## 2019-01-16 ENCOUNTER — Ambulatory Visit: Payer: Medicare Other | Admitting: Oncology

## 2019-01-20 NOTE — Progress Notes (Signed)
[place]  Telephone:([phone] Fax:(336) [phone]     ID: Alexis Alexis [name] DOB: 09-21-1950  MR#: [id number]  OVF#:[id number]  Patient Care Team: Tanda Rockers, MD as PCP - General (Pulmonary Disease) Alphonsa Overall, MD as Consulting Physician (General Surgery) Dekker Verga, Virgie Dad, MD as Consulting Physician (Oncology) Kyung Rudd, MD as Consulting Physician (Radiation Oncology) Leeroy Cha, MD as Consulting Physician (Neurosurgery) Allyn Kenner, MD (Dermatology) Regal, Tamala Fothergill, DPM as Consulting Physician (Podiatry) PCP: Tanda Rockers, MD OTHER MD:   CHIEF COMPLAINT: estrogen receptor positive breast Alexis [name]  CURRENT TREATMENT:  Tamoxifen   BREAST Alexis [name] HISTORY: From the original intake note:  Alexis Alexis [name] had routine mammographic screening with tomography at the [facility] 11/04/2015. This showed an area of distortion and calcifications in the right breast.  Right diagnostic mammography with tomography and right breast ultrasonography at the [facility] 11/15/2015 on the breast density to be categoryC.  In the upper-outer quadrant of the right breast there was an area of distortion associated with pleomorphic calcifications measuring approximately 1 cm. On exam there was no palpable mass.  By ultrasonography, there was an irregular hypoechoic mass superiorly measuring 1.2 cm. Ultrasound of the Alexis [name] was benign.  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  Her subsequent history is as detailed below   INTERVAL HISTORY: Alexis Alexis [name].   She continues on tamoxifen. She does not have  any noticeable hot flashes. She notices some vaginal discharge, but it is not noticeable most days.  Since her last visit here, she underwent a digital diagnostic bilateral mammogram with tomography on 11/14/2018 showing: Breast Density Category C. There is no mammographic evidence of malignancy.    REVIEW OF SYSTEMS: Alexis Alexis [name]. She hasn't tried any stretching exercises. Alexis Alexis [name] has been busy working in her yard. She has also been taking care of her 34 and 23 year old grandchildren as Alexis Alexis [name]'s daughter is a Education officer, museum and has had to teach from home. She has been protecting herself amid the pandemic. The patient denies unusual headaches, visual changes, nausea, vomiting, or dizziness. There has been no unusual cough, phlegm production, or pleurisy. This been no change in bowel or bladder habits. The patient denies unexplained fatigue or unexplained weight loss, bleeding, rash, or fever. A detailed review of systems was otherwise noncontributory.    PAST MEDICAL HISTORY: Past Medical History:  Diagnosis Date  . Breast Alexis [name] ([place]) 11/16/2015   Right Breast  . Chronic fatigue   . Cough   . GERD (gastroesophageal reflux disease)   . Heart murmur    "years ago, when I was young"  . Hemangioma   . History of hiatal hernia   . Hyperlipidemia   . IBS (irritable bowel syndrome)   . Iron deficiency anemia   . Kyphosis of thoracic region   . MVP (mitral valve prolapse)   . Osteopenia   . SAH (subarachnoid hemorrhage) ([place])   . Vertigo     PAST SURGICAL HISTORY: Past Surgical History:  Procedure Laterality Date  . APPENDECTOMY    . BREAST BIOPSY Right 11/16/2015   U/S Core- Malignant  .  BREAST BIOPSY Left 06/21/2005   Stereo- Benign  . BREAST EXCISIONAL BIOPSY Left 2007  . BREAST LUMPECTOMY Right 12/22/2015  . BREAST LUMPECTOMY WITH RADIOACTIVE SEED AND SENTINEL LYMPH NODE BIOPSY Right 12/22/2015   Procedure: BREAST LUMPECTOMY WITH  RADIOACTIVE SEED AND SENTINEL LYMPH NODE BIOPSY;  Surgeon: Alphonsa Overall, MD;  Location: [place];  Service: General;  Laterality: Right;  . FOOT SURGERY Right    bunionectomy  . [facility]  2003   s/p embolization per Dr. Estanislado [name]  . TONSILLECTOMY    . TOTAL HIP ARTHROPLASTY Left 01/11/2017   Procedure: LEFT TOTAL HIP ARTHROPLASTY ANTERIOR APPROACH;  Surgeon: [name], MD;  Location: [place];  Service: Orthopedics;  Laterality: Left;  . TUBAL LIGATION      FAMILY HISTORY Family History  Problem Relation Age of Onset  . Hyperlipidemia Mother   . Breast Alexis [name] Mother 71  . Heart attack Father   . Diabetes Brother   . Breast Alexis [name] Maternal Aunt   . Breast Alexis [name] Paternal Aunt 64  the patient's father died from a ruptured aneurysm at the age of 38. The patient's mother was diagnosed with breast Alexis [name] at the age of 52 and died at age 54. The patient has a paternal aunt diagnosed with breast Alexis [name] at age 20. The patient has 2 brothers, 2 sisters. Ne brother has had a history of aneurysms, as has the patient There is no history of ovarian Alexis [name] in the family.  GYNECOLOGIC HISTORY:  No LMP recorded. Patient is postmenopausal. Menarche age 64, first live birth age 34, the patient is [place] P2. She stopped having periods at age 89.He did not take hormone replacement. 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 Daughter [name] lives in [place] where she works as an Medical illustrator. (Adopted) son Alexis Alexis [name] lives in [place] where he is a Museum/gallery curator. 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 The patient has 3 grandchildren aged 83, 60, and 101. She attends a [employer]    ADVANCED DIRECTIVES: not in place   HEALTH MAINTENANCE: Social History   Tobacco Use  .  Smoking status: Never Smoker  . Smokeless tobacco: Never Used  Substance Use Topics  . Alcohol use: No  . Drug use: No     Colonoscopy: 2012/[facility]  PAP:2012  Bone density: 2015  Lipid panel:  Allergies  Allergen Reactions  . Other Itching    Jell-o  . Latex Rash    Contact rash    Current Outpatient Medications  Medication Sig Dispense Refill  . aspirin EC 81 MG tablet Take 81 mg by mouth daily.    . B Complex-C (SUPER B COMPLEX/VITAMIN C PO) Take 1 tablet by mouth daily.    . Calcium Carbonate-Vitamin D (CALCIUM 600/VITAMIN D PO) Take 1 tablet by mouth daily.    Marland Kitchen docusate sodium (COLACE) 100 MG capsule Take 100 mg by mouth 2 (two) times daily.    . pravastatin (PRAVACHOL) 40 MG tablet TAKE 1 TABLET BY MOUTH IN THE EVENING 90 tablet 3  . psyllium (REGULOID) 0.52 g capsule Take 0.52 g by mouth daily.    . tamoxifen (NOLVADEX) 20 MG tablet Take 1 tablet (20 mg total) by mouth at bedtime. 90 tablet 4   No current facility-administered medications for this visit.  OBJECTIVE: middle-aged white woman in no acute distress Vitals:   01/21/19 1055  BP: (!) 149/52  Pulse: (!) 55  Resp: 18  Temp: 98.7 F (37.1 C)  SpO2: 100%     Body mass index is 18.74 kg/m.    ECOG FS:1 - Symptomatic but completely ambulatory  Sclerae unicteric, EOMs intact Wearing a mask No cervical or supraclavicular adenopathy Lungs no rales or rhonchi Heart regular rate and rhythm Abd soft, nontender, positive bowel sounds MSK no focal spinal tenderness, no upper extremity lymphedema Neuro: nonfocal, well oriented, appropriate affect Breasts: Both breasts are status post lumpectomy.  Careful examination of the right breast and right Alexis [name] shows absolutely no finding of concern.  Left breast is unremarkable.  Left Alexis [name] is benign.  LAB RESULTS:  CMP     Component Value Date/Time   NA 142 01/21/2019 1027   NA 144 12/26/2016 1105   K 4.1 01/21/2019 1027   K 4.0 12/26/2016 1105   CL 104  01/21/2019 1027   CO2 30 01/21/2019 1027   CO2 29 12/26/2016 1105   GLUCOSE 94 01/21/2019 1027   GLUCOSE 92 12/26/2016 1105   GLUCOSE 106 (H) 05/24/2006 0950   BUN 10 01/21/2019 1027   BUN 12.2 12/26/2016 1105   CREATININE 0.75 01/21/2019 1027   CREATININE 0.7 12/26/2016 1105   CALCIUM 9.8 01/21/2019 1027   CALCIUM 9.7 12/26/2016 1105   PROT 6.9 01/21/2019 1027   PROT 6.7 12/26/2016 1105   ALBUMIN 4.0 01/21/2019 1027   ALBUMIN 3.7 12/26/2016 1105   AST 25 01/21/2019 1027   AST 22 12/26/2016 1105   ALT 21 01/21/2019 1027   ALT 17 12/26/2016 1105   ALKPHOS 41 01/21/2019 1027   ALKPHOS 49 12/26/2016 1105   BILITOT 0.5 01/21/2019 1027   BILITOT 0.57 12/26/2016 1105   GFRNONAA >60 01/21/2019 1027   GFRAA >60 01/21/2019 1027    INo results found for: SPEP, UPEP  Lab Results  Component Value Date   WBC 4.9 01/21/2019   NEUTROABS 3.0 01/21/2019   HGB 12.2 01/21/2019   HCT 37.3 01/21/2019   MCV 90.8 01/21/2019   PLT 155 01/21/2019      Chemistry      Component Value Date/Time   NA 142 01/21/2019 1027   NA 144 12/26/2016 1105   K 4.1 01/21/2019 1027   K 4.0 12/26/2016 1105   CL 104 01/21/2019 1027   CO2 30 01/21/2019 1027   CO2 29 12/26/2016 1105   BUN 10 01/21/2019 1027   BUN 12.2 12/26/2016 1105   CREATININE 0.75 01/21/2019 1027   CREATININE 0.7 12/26/2016 1105      Component Value Date/Time   CALCIUM 9.8 01/21/2019 1027   CALCIUM 9.7 12/26/2016 1105   ALKPHOS 41 01/21/2019 1027   ALKPHOS 49 12/26/2016 1105   AST 25 01/21/2019 1027   AST 22 12/26/2016 1105   ALT 21 01/21/2019 1027   ALT 17 12/26/2016 1105   BILITOT 0.5 01/21/2019 1027   BILITOT 0.57 12/26/2016 1105       No results found for: LABCA2  No components found for: LABCA125  No results for input(s): INR in the last 168 hours.  Urinalysis    Component Value Date/Time   COLORURINE STRAW (A) 01/01/2017 1630   APPEARANCEUR CLEAR 01/01/2017 1630   LABSPEC 1.013 01/01/2017 1630   PHURINE  6.0 01/01/2017 1630   GLUCOSEU NEGATIVE 01/01/2017 1630   GLUCOSEU NEGATIVE 04/21/2015 0932   HGBUR NEGATIVE 01/01/2017 1630  BILIRUBINUR NEGATIVE 01/01/2017 1630   KETONESUR NEGATIVE 01/01/2017 1630   PROTEINUR NEGATIVE 01/01/2017 1630   UROBILINOGEN 0.2 04/21/2015 0932   NITRITE NEGATIVE 01/01/2017 1630   LEUKOCYTESUR NEGATIVE 01/01/2017 1630      ELIGIBLE FOR AVAILABLE RESEARCH PROTOCOL: no  STUDIES: No results found.   ASSESSMENT: 68 y.o. [place], [place] woman status post right breast upper outer quadrant biopsy 11/16/2015 for a clinical T1b N0, stage IA invasive ductal carcinoma, grade 1, estrogen receptor positive, progesterone receptor negative, HER-2 nonamplified, with an MIB-1 of 5%.  (1) status post right lumpectomy 12/22/2015 for a pT1c pN0, stage IA  invasive ductal carcinoma, grade 1, repeat HER-2 again negative  (2) Oncotype DX score of 21 predicts a risk of recurrence outside the breast over the next 10 years of 14% if the patient's only systemic therapy is tamoxifen for 5 years. It predicts a benefit from chemotherapy in the 3-4% range.  (3) adjuvant radiation 02/06/2016 through 03/05/2016 Site/dose:   The patient initially received a dose of 42.5 Gy in 17 fractions to the breast using whole-breast tangent fields. This was delivered using a 3-D conformal technique. The patient then received a boost to the seroma. This delivered an additional 7.5 Gy in 3 fractions using an en face electron field due to the depth of the seroma. The total dose was 50 Gy.   (4) Started tamoxifen October 2017  (a) [facility] density at [facility] 04/16/2014 found osteopenia with a T score of -1.6  (5) the patient does not meet criteria for genetics testing  (6) dense breasts: Considered yearly MRI in addition to mammography  (7) genetics testing requested August 2020   PLAN: Alexis Alexis [name] is now just over 3 years out from definitive surgery for her breast Alexis [name] with no evidence of  disease recurrence.  This is very favorable.  She is tolerating tamoxifen well and the plan is to continue that a minimum of 5 years.  She tells me since her last visit here she has learned that her mother sister also had breast Alexis [name].  The patient's mother recall did have breast Alexis [name].  Karsten is very interested in getting genetically tested and I have placed a request regarding that  In addition she tells me that her husband's brother who is 47 years old has been diagnosed with breast Alexis [name].  She understands that if he tests positive then her husband also and other family members would have to be tested  I reassured her that the discomfort she is feeling in the right Alexis [name] is secondary to scar tissue.  There is no evidence of disease recurrence  She will see me again in 1 year.  She knows to call for any other issues that may develop before then.    Zalan Shidler, Virgie Dad, MD  01/21/19 11:34 AM Medical Oncology and Hematology [facility] [address], [place] [address] Tel. [phone]    Fax. [phone]  I, Jacqualyn Posey am acting as a Education administrator for Chauncey Cruel, MD.   I, Lurline Del MD, have reviewed the above documentation for accuracy and completeness, and I agree with the above.

## 2019-01-21 ENCOUNTER — Inpatient Hospital Stay: Payer: Medicare Other | Attending: Oncology | Admitting: Oncology

## 2019-01-21 ENCOUNTER — Other Ambulatory Visit: Payer: Self-pay

## 2019-01-21 ENCOUNTER — Inpatient Hospital Stay: Payer: Medicare Other

## 2019-01-21 VITALS — BP 149/52 | HR 55 | Temp 98.7°F | Resp 18 | Ht 65.0 in | Wt 112.6 lb

## 2019-01-21 DIAGNOSIS — E785 Hyperlipidemia, unspecified: Secondary | ICD-10-CM | POA: Insufficient documentation

## 2019-01-21 DIAGNOSIS — M810 Age-related osteoporosis without current pathological fracture: Secondary | ICD-10-CM | POA: Diagnosis not present

## 2019-01-21 DIAGNOSIS — C50411 Malignant neoplasm of upper-outer quadrant of right female breast: Secondary | ICD-10-CM

## 2019-01-21 DIAGNOSIS — Z170 Estrogen receptor positive status [ER+]: Secondary | ICD-10-CM

## 2019-01-21 DIAGNOSIS — Z79810 Long term (current) use of selective estrogen receptor modulators (SERMs): Secondary | ICD-10-CM | POA: Insufficient documentation

## 2019-01-21 DIAGNOSIS — Z803 Family history of malignant neoplasm of breast: Secondary | ICD-10-CM | POA: Diagnosis not present

## 2019-01-21 DIAGNOSIS — M8580 Other specified disorders of bone density and structure, unspecified site: Secondary | ICD-10-CM | POA: Insufficient documentation

## 2019-01-21 DIAGNOSIS — Z79899 Other long term (current) drug therapy: Secondary | ICD-10-CM | POA: Insufficient documentation

## 2019-01-21 DIAGNOSIS — Z7982 Long term (current) use of aspirin: Secondary | ICD-10-CM | POA: Insufficient documentation

## 2019-01-21 DIAGNOSIS — I341 Nonrheumatic mitral (valve) prolapse: Secondary | ICD-10-CM | POA: Insufficient documentation

## 2019-01-21 LAB — CBC WITH DIFFERENTIAL (CANCER CENTER ONLY)
Abs Immature Granulocytes: 0.01 10*3/uL (ref 0.00–0.07)
Basophils Absolute: 0 10*3/uL (ref 0.0–0.1)
Basophils Relative: 1 %
Eosinophils Absolute: 0.1 10*3/uL (ref 0.0–0.5)
Eosinophils Relative: 2 %
HCT: 37.3 % (ref 36.0–46.0)
Hemoglobin: 12.2 g/dL (ref 12.0–15.0)
Immature Granulocytes: 0 %
Lymphocytes Relative: 28 %
Lymphs Abs: 1.4 10*3/uL (ref 0.7–4.0)
MCH: 29.7 pg (ref 26.0–34.0)
MCHC: 32.7 g/dL (ref 30.0–36.0)
MCV: 90.8 fL (ref 80.0–100.0)
Monocytes Absolute: 0.5 10*3/uL (ref 0.1–1.0)
Monocytes Relative: 10 %
Neutro Abs: 3 10*3/uL (ref 1.7–7.7)
Neutrophils Relative %: 59 %
Platelet Count: 155 10*3/uL (ref 150–400)
RBC: 4.11 MIL/uL (ref 3.87–5.11)
RDW: 12.7 % (ref 11.5–15.5)
WBC Count: 4.9 10*3/uL (ref 4.0–10.5)
nRBC: 0 % (ref 0.0–0.2)

## 2019-01-21 LAB — CMP (CANCER CENTER ONLY)
ALT: 21 U/L (ref 0–44)
AST: 25 U/L (ref 15–41)
Albumin: 4 g/dL (ref 3.5–5.0)
Alkaline Phosphatase: 41 U/L (ref 38–126)
Anion gap: 8 (ref 5–15)
BUN: 10 mg/dL (ref 8–23)
CO2: 30 mmol/L (ref 22–32)
Calcium: 9.8 mg/dL (ref 8.9–10.3)
Chloride: 104 mmol/L (ref 98–111)
Creatinine: 0.75 mg/dL (ref 0.44–1.00)
GFR, Est AFR Am: >60 mL/min (ref >60)
GFR, Estimated: >60 mL/min (ref >60)
Glucose, Bld: 94 mg/dL (ref 70–99)
Potassium: 4.1 mmol/L (ref 3.5–5.1)
Sodium: 142 mmol/L (ref 135–145)
Total Bilirubin: 0.5 mg/dL (ref 0.3–1.2)
Total Protein: 6.9 g/dL (ref 6.5–8.1)

## 2019-01-21 MED ORDER — TAMOXIFEN CITRATE 20 MG PO TABS: 20.0000 mg | ORAL_TABLET | Freq: Every day | ORAL | 4 refills | Status: DC

## 2019-01-22 ENCOUNTER — Telehealth: Payer: Self-pay | Admitting: Oncology

## 2019-01-22 NOTE — Telephone Encounter (Signed)
I talk with patient regarding schedule  

## 2019-01-27 ENCOUNTER — Encounter: Payer: Self-pay | Admitting: Gastroenterology

## 2019-02-02 ENCOUNTER — Telehealth: Payer: Self-pay | Admitting: Genetic Counselor

## 2019-02-02 NOTE — Telephone Encounter (Signed)
Called patient regarding upcoming [facility] appointment, left a voicemail. This will be a walk-in visit due to no communication to set this up as virtual.  °

## 2019-02-03 ENCOUNTER — Encounter: Payer: Self-pay | Admitting: Genetic Counselor

## 2019-02-03 ENCOUNTER — Inpatient Hospital Stay: Payer: Medicare Other

## 2019-02-03 ENCOUNTER — Other Ambulatory Visit: Payer: Self-pay

## 2019-02-03 ENCOUNTER — Inpatient Hospital Stay (HOSPITAL_BASED_OUTPATIENT_CLINIC_OR_DEPARTMENT_OTHER): Payer: Medicare Other | Admitting: Genetic Counselor

## 2019-02-03 DIAGNOSIS — I341 Nonrheumatic mitral (valve) prolapse: Secondary | ICD-10-CM | POA: Diagnosis not present

## 2019-02-03 DIAGNOSIS — Z801 Family history of malignant neoplasm of trachea, bronchus and lung: Secondary | ICD-10-CM | POA: Diagnosis not present

## 2019-02-03 DIAGNOSIS — M8580 Other specified disorders of bone density and structure, unspecified site: Secondary | ICD-10-CM | POA: Diagnosis not present

## 2019-02-03 DIAGNOSIS — C50411 Malignant neoplasm of upper-outer quadrant of right female breast: Secondary | ICD-10-CM | POA: Diagnosis not present

## 2019-02-03 DIAGNOSIS — Z170 Estrogen receptor positive status [ER+]: Secondary | ICD-10-CM

## 2019-02-03 DIAGNOSIS — Z1379 Encounter for other screening for genetic and chromosomal anomalies: Secondary | ICD-10-CM

## 2019-02-03 DIAGNOSIS — Z803 Family history of malignant neoplasm of breast: Secondary | ICD-10-CM

## 2019-02-03 DIAGNOSIS — M810 Age-related osteoporosis without current pathological fracture: Secondary | ICD-10-CM

## 2019-02-03 DIAGNOSIS — Z79810 Long term (current) use of selective estrogen receptor modulators (SERMs): Secondary | ICD-10-CM | POA: Diagnosis not present

## 2019-02-03 DIAGNOSIS — E785 Hyperlipidemia, unspecified: Secondary | ICD-10-CM | POA: Diagnosis not present

## 2019-02-03 LAB — CBC WITH DIFFERENTIAL/PLATELET
Abs Immature Granulocytes: 0.01 10*3/uL (ref 0.00–0.07)
Basophils Absolute: 0 10*3/uL (ref 0.0–0.1)
Basophils Relative: 1 %
Eosinophils Absolute: 0.1 10*3/uL (ref 0.0–0.5)
Eosinophils Relative: 2 %
HCT: 36.5 % (ref 36.0–46.0)
Hemoglobin: 11.9 g/dL — ABNORMAL LOW (ref 12.0–15.0)
Immature Granulocytes: 0 %
Lymphocytes Relative: 26 %
Lymphs Abs: 1.5 10*3/uL (ref 0.7–4.0)
MCH: 29.3 pg (ref 26.0–34.0)
MCHC: 32.6 g/dL (ref 30.0–36.0)
MCV: 89.9 fL (ref 80.0–100.0)
Monocytes Absolute: 0.7 10*3/uL (ref 0.1–1.0)
Monocytes Relative: 12 %
Neutro Abs: 3.4 10*3/uL (ref 1.7–7.7)
Neutrophils Relative %: 59 %
Platelets: 153 10*3/uL (ref 150–400)
RBC: 4.06 MIL/uL (ref 3.87–5.11)
RDW: 12.6 % (ref 11.5–15.5)
WBC: 5.8 10*3/uL (ref 4.0–10.5)
nRBC: 0 % (ref 0.0–0.2)

## 2019-02-03 LAB — CMP (CANCER CENTER ONLY)
ALT: 20 U/L (ref 0–44)
AST: 26 U/L (ref 15–41)
Albumin: 4 g/dL (ref 3.5–5.0)
Alkaline Phosphatase: 43 U/L (ref 38–126)
Anion gap: 9 (ref 5–15)
BUN: 12 mg/dL (ref 8–23)
CO2: 29 mmol/L (ref 22–32)
Calcium: 9 mg/dL (ref 8.9–10.3)
Chloride: 105 mmol/L (ref 98–111)
Creatinine: 0.72 mg/dL (ref 0.44–1.00)
GFR, Est AFR Am: >60 mL/min (ref >60)
GFR, Estimated: >60 mL/min (ref >60)
Glucose, Bld: 79 mg/dL (ref 70–99)
Potassium: 4.2 mmol/L (ref 3.5–5.1)
Sodium: 143 mmol/L (ref 135–145)
Total Bilirubin: 0.5 mg/dL (ref 0.3–1.2)
Total Protein: 6.7 g/dL (ref 6.5–8.1)

## 2019-02-03 NOTE — Progress Notes (Signed)
REFERRING PROVIDER: Chauncey Cruel, MD [address] [place],  [place] [address]  PRIMARY PROVIDER:  Tanda Rockers, MD  PRIMARY REASON FOR VISIT:  1. Malignant neoplasm of upper-outer quadrant of right breast in female, estrogen receptor positive ([place])   2. Family history of breast cancer   3. Family history of lung cancer      HISTORY OF PRESENT ILLNESS:   Alexis [name], a 68 y.o. female, was seen for a [facility] cancer genetics consultation at the request of Dr. Jana Hakim due to a personal and family history of breast cancer.  Alexis [name] presents to clinic today to discuss the possibility of a hereditary predisposition to cancer, genetic testing, and to further clarify her future cancer risks, as well as potential cancer risks for family members.   In 2017, at the age of 81, Alexis [name] was diagnosed with IDC, ER+/PR-/Her2-, of the right breast. The treatment plan included lumpectomy, radiation therapy, and antiestrogen therapy.   CANCER HISTORY:  Oncology History  Malignant neoplasm of upper-outer quadrant of right breast in female, estrogen receptor positive ([place])  11/16/2015 Breast US   Targeted ultrasound is performed, showing an irregular, hypoechoic mass at 12 o'clock, 1 cm from the nipple measuring 1.2 x 0.8 x 0.9 cm, corresponding to the area of concern within the superior right breast.    11/16/2015 Initial Biopsy   (R) breast needle biopsy (12:00): IDC with calcs, DCIS with calcs, grade 1. ER+ (100%), PR-, Ki67 5%, HER2 neg (ratio 1.00).    11/17/2015 Initial Diagnosis   Breast cancer of upper-outer quadrant of right female breast ([place])   12/22/2015 Surgery   (R) lumpectomy with SLNB Lucia Gaskins): IDC, spanning 1.1 cm, DCIS with calcs, tumor focally <0.1 cm from anterior margin. 0/1 SLN.   pT1c, pN0: Stage IA    12/22/2015 Oncotype testing   Recurrence score: 21; ROR 14% (intermediate risk)    02/06/2016 - 03/05/2016 Radiation Therapy   Adjuvant breast radiation Lisbeth Renshaw).  Right breast: 42.5 Gy in 17 fx. Right breast boost: 7.5 Gy in 3 fx   03/2016 -  Anti-estrogen oral therapy   Tamoxifen 20 mg daily. Planned duration of therapy: 5 years      RISK FACTORS:  Menarche was at age 89.  First live birth at age 27.  OCP use for approximately 20 years.  Ovaries intact: yes.  Hysterectomy: no.  Menopausal status: postmenopausal.  HRT use: 0 years. Colonoscopy: yes; normal. Mammogram within the last year: yes. Number of breast biopsies: 2.   Past Medical History:  Diagnosis Date  . Breast cancer ([place]) 11/16/2015   Right Breast  . Chronic fatigue   . Cough   . Family history of breast cancer   . Family history of lung cancer   . GERD (gastroesophageal reflux disease)   . Heart murmur    "years ago, when I was young"  . Hemangioma   . History of hiatal hernia   . Hyperlipidemia   . IBS (irritable bowel syndrome)   . Iron deficiency anemia   . Kyphosis of thoracic region   . MVP (mitral valve prolapse)   . Osteopenia   . SAH (subarachnoid hemorrhage) ([place])   . Vertigo     Past Surgical History:  Procedure Laterality Date  . APPENDECTOMY    . BREAST BIOPSY Right 11/16/2015   U/S Core- Malignant  . BREAST BIOPSY Left 06/21/2005   Stereo- Benign  . BREAST EXCISIONAL BIOPSY Left 2007  . BREAST LUMPECTOMY  Right 12/22/2015  . BREAST LUMPECTOMY WITH RADIOACTIVE SEED AND SENTINEL LYMPH NODE BIOPSY Right 12/22/2015   Procedure: BREAST LUMPECTOMY WITH RADIOACTIVE SEED AND SENTINEL LYMPH NODE BIOPSY;  Surgeon: Alphonsa Overall, MD;  Location: [place];  Service: General;  Laterality: Right;  . FOOT SURGERY Right    bunionectomy  . [facility]  [facility]   s/p embolization per Dr. Estanislado Pandy  . TONSILLECTOMY    . TOTAL HIP ARTHROPLASTY Left 01/11/2017   Procedure: LEFT TOTAL HIP ARTHROPLASTY ANTERIOR APPROACH;  Surgeon: [name], MD;  Location: [place];  Service: Orthopedics;  Laterality: Left;  . TUBAL LIGATION      Social History    Socioeconomic History  . Marital status: Married    Spouse name: Not on file  . Number of children: Not on file  . Years of education: Not on file  . Highest education level: Not on file  Occupational History  . Occupation: CSR  Social Needs  . Financial resource strain: Not on file  . Food insecurity    Worry: Not on file    Inability: Not on file  . Transportation needs    Medical: Not on file    Non-medical: Not on file  Tobacco Use  . Smoking status: Never Smoker  . Smokeless tobacco: Never Used  Substance and Sexual Activity  . Alcohol use: No  . Drug use: No  . Sexual activity: Not on file  Lifestyle  . Physical activity    Days per week: Not on file    Minutes per session: Not on file  . Stress: Not on file  Relationships  . Social Herbalist on phone: Not on file    Gets together: Not on file    Attends religious service: Not on file    Active member of club or organization: Not on file    Attends meetings of clubs or organizations: Not on file    Relationship status: Not on file  Other Topics Concern  . Not on file  Social History Narrative  . Not on file     FAMILY HISTORY:  We obtained a detailed, 4-generation family history.  Significant diagnoses are listed below: Family History  Problem Relation Age of Onset  . Hyperlipidemia Mother   . Breast cancer Mother 44  . Heart attack Father   . Diabetes Brother   . Breast cancer Maternal Aunt        diagnosed 10s or 43s  . Breast cancer Paternal Aunt 74       diagnosed in her 67s  . Lung cancer Paternal Uncle    Alexis [name] has two daughters, Alexis [name] and Alexis [name], and one adopted son, Alexis [name], who is biologically related. She has two sisters who are alive, and two brothers who have died. There are no other diagnoses of cancer among these relatives.  Alexis [name] mother died at the age of 44 and had breast cancer when she was around 102. One maternal aunt also had breast cancer that was diagnosed in  her 12s or 63s. Alexis [name]'s maternal grandmother died in her 44s due to a stroke.  Alexis [name]'s father died at the age of 54 due to an aneurysm. She had one paternal aunt who had breast cancer in her 75s, and one maternal uncle who had lung cancer at an unknown age.   Alexis [name] is unaware of previous family history of genetic testing for hereditary cancer risks. Patient's maternal ancestors are of  unknown descent, and paternal ancestors are of unkonwn descent. There is no reported Ashkenazi Jewish ancestry. There is no known consanguinity.  GENETIC COUNSELING ASSESSMENT: Alexis [name] is a 68 y.o. female with a personal and family history of breast cancer which is somewhat suggestive of a hereditary cancer syndrome and predisposition to cancer. We, therefore, discussed and recommended the following at today's visit.   DISCUSSION: We discussed that 5 - 10% of breast cancer is hereditary, with most cases associated with BRCA1/2.  There are other genes that can be associated with hereditary breast cancer syndromes.  These include ATM, CHEK2, PALB2, etc.  We discussed that testing is beneficial for several reasons including knowing about other cancer risks, identifying potential screening and risk-reduction options that may be appropriate, and to understand if other family members could be at risk for cancer and allow them to undergo genetic testing.  We reviewed the characteristics, features and inheritance patterns of hereditary cancer syndromes. We also discussed genetic testing, including the appropriate family members to test, the process of testing, insurance coverage and turn-around-time for results. We discussed the implications of a negative, positive and/or variant of uncertain significant result. We recommended Alexis [name] pursue genetic testing for the [facility] Common Hereditary Cancers gene panel.   The Common Hereditary Cancers Panel offered by [facility] includes sequencing and/or deletion duplication  testing of the following 48 genes: APC, ATM, AXIN2, BARD1, BMPR1A, BRCA1, BRCA2, BRIP1, CDH1, CDK4, CDKN2A (p14ARF), CDKN2A (p16INK4a), CHEK2, CTNNA1, DICER1, EPCAM (Deletion/duplication testing only), GREM1 (promoter region deletion/duplication testing only), KIT, MEN1, MLH1, MSH2, MSH3, MSH6, MUTYH, NBN, NF1, NHTL1, PALB2, PDGFRA, PMS2, POLD1, POLE, PTEN, RAD50, RAD51C, RAD51D, RNF43, SDHB, SDHC, SDHD, SMAD4, SMARCA4. STK11, TP53, TSC1, TSC2, and VHL.  The following genes were evaluated for sequence changes only: SDHA and HOXB13 c.251G>A variant only.  Based on Ms. Balentine's personal and family history of cancer, she meets medical criteria for genetic testing. Despite that she meets criteria, she may still have an out of pocket cost. We discussed that if her out of pocket cost for testing is over $100, the laboratory will call and confirm whether she wants to proceed with testing.  If the out of pocket cost of testing is less than $100 she will be billed by the genetic testing laboratory.   PLAN: After considering the risks, benefits, and limitations, Ms. Pompei provided informed consent to pursue genetic testing and the blood sample was sent to [facility] for analysis of the Common Hereditary Cancers panel. Results should be available within approximately two-three weeks' time, at which point they will be disclosed by telephone to Alexis [name], as will any additional recommendations warranted by these results. Alexis [name] will receive a summary of her genetic counseling visit and a copy of her results once available. This information will also be available in Epic.   Alexis [name] questions were answered to her satisfaction today. Our contact information was provided should additional questions or concerns arise. Thank you for the referral and allowing [place] to share in the care of your patient.   Clint Guy, MS Genetic Counselor [email or web address] Phone: [phone]   The patient was  seen for a total of 30 minutes in face-to-face genetic counseling.  This patient was discussed with Drs. Magrinat, Lindi Adie and/or Burr Medico who agrees with the above.    _______________________________________________________________________ For Office Staff:  Number of people involved in session: 1 Was an Intern/ student involved with case: yes - observation

## 2019-02-06 DIAGNOSIS — Z01419 Encounter for gynecological examination (general) (routine) without abnormal findings: Secondary | ICD-10-CM | POA: Diagnosis not present

## 2019-02-06 DIAGNOSIS — R636 Underweight: Secondary | ICD-10-CM | POA: Diagnosis not present

## 2019-02-06 DIAGNOSIS — N852 Hypertrophy of uterus: Secondary | ICD-10-CM | POA: Diagnosis not present

## 2019-02-09 DIAGNOSIS — Z1379 Encounter for other screening for genetic and chromosomal anomalies: Secondary | ICD-10-CM | POA: Insufficient documentation

## 2019-02-10 ENCOUNTER — Telehealth: Payer: Self-pay | Admitting: Genetic Counselor

## 2019-02-10 ENCOUNTER — Encounter: Payer: Self-pay | Admitting: Genetic Counselor

## 2019-02-10 ENCOUNTER — Ambulatory Visit: Payer: Self-pay | Admitting: Genetic Counselor

## 2019-02-10 DIAGNOSIS — Z1379 Encounter for other screening for genetic and chromosomal anomalies: Secondary | ICD-10-CM

## 2019-02-10 NOTE — Telephone Encounter (Signed)
Revealed negative genetic testing.  Discussed that we do not know why she has breast cancer or why there is cancer in the family. It could be due to a different gene that we are not testing, or maybe our current technology may not be able to pick something up.  It will be important for her to keep in contact with genetics to keep up with whether additional testing may be needed. 

## 2019-02-10 NOTE — Progress Notes (Signed)
HPI:  Ms. Sigman was previously seen in the [place] clinic due to a personal and family history of breast cancer and concerns regarding a hereditary predisposition to cancer. Please refer to our prior cancer genetics clinic note for more information regarding our discussion, assessment and recommendations, at the time. Ms. Rozell recent genetic test results were disclosed to her, as were recommendations warranted by these results. These results and recommendations are discussed in more detail below.  CANCER HISTORY:  Oncology History  Malignant neoplasm of upper-outer quadrant of right breast in female, estrogen receptor positive ([place])  11/16/2015 Breast US   Targeted ultrasound is performed, showing an irregular, hypoechoic mass at 12 o'clock, 1 cm from the nipple measuring 1.2 x 0.8 x 0.9 cm, corresponding to the area of concern within the superior right breast.    11/16/2015 Initial Biopsy   (R) breast needle biopsy (12:00): IDC with calcs, DCIS with calcs, grade 1. ER+ (100%), PR-, Ki67 5%, HER2 neg (ratio 1.00).    11/17/2015 Initial Diagnosis   Breast cancer of upper-outer quadrant of right female breast ([place])   12/22/2015 Surgery   (R) lumpectomy with SLNB Lucia Gaskins): IDC, spanning 1.1 cm, DCIS with calcs, tumor focally <0.1 cm from anterior margin. 0/1 SLN.   pT1c, pN0: Stage IA    12/22/2015 Oncotype testing   Recurrence score: 21; ROR 14% (intermediate risk)    02/06/2016 - 03/05/2016 Radiation Therapy   Adjuvant breast radiation Lisbeth Renshaw). Right breast: 42.5 Gy in 17 fx. Right breast boost: 7.5 Gy in 3 fx   03/2016 -  Anti-estrogen oral therapy   Tamoxifen 20 mg daily. Planned duration of therapy: 5 years   02/09/2019 Genetic Testing   Negative genetic testing on the [facility] Common Hereditary Cancers panel.  The Common Hereditary Cancers Panel offered by [facility] includes sequencing and/or deletion duplication testing of the following 48 genes: APC, ATM, AXIN2, BARD1,  BMPR1A, BRCA1, BRCA2, BRIP1, CDH1, CDK4, CDKN2A (p14ARF), CDKN2A (p16INK4a), CHEK2, CTNNA1, DICER1, EPCAM (Deletion/duplication testing only), GREM1 (promoter region deletion/duplication testing only), KIT, MEN1, MLH1, MSH2, MSH3, MSH6, MUTYH, NBN, NF1, NHTL1, PALB2, PDGFRA, PMS2, POLD1, POLE, PTEN, RAD50, RAD51C, RAD51D, RNF43, SDHB, SDHC, SDHD, SMAD4, SMARCA4. STK11, TP53, TSC1, TSC2, and VHL.  The following genes were evaluated for sequence changes only: SDHA and HOXB13 c.251G>A variant only.      FAMILY HISTORY:  We obtained a detailed, 4-generation family history.  Significant diagnoses are listed below: Family History  Problem Relation Age of Onset  . Hyperlipidemia Mother   . Breast cancer Mother 67  . Heart attack Father   . Diabetes Brother   . Breast cancer Maternal Aunt        diagnosed 10s or 34s  . Breast cancer Paternal Aunt 31       diagnosed in her 70s  . Lung cancer Paternal Uncle      Ms. Ozbun has two daughters, Rojelio Brenner and Judson Roch, and one adopted son, Mitzi Hansen, who is biologically related. She has two sisters who are alive, and two brothers who have died. There are no other diagnoses of cancer among these relatives.  Ms. Raisanen mother died at the age of 46 and had breast cancer when she was around 19. One maternal aunt also had breast cancer that was diagnosed in her 62s or 13s. Ms. Pamer's maternal grandmother died in her 22s due to a stroke.  Ms. Summer's father died at the age of 59 due to an aneurysm. She had one paternal aunt  who had breast cancer in her 30s, and one maternal uncle who had lung cancer at an unknown age.   Ms. Lynn is unaware of previous family history of genetic testing for hereditary cancer risks. Patient's maternal ancestors are of unknown descent, and paternal ancestors are of unkonwn descent. There is no reported Ashkenazi Jewish ancestry. There is no known consanguinity.  GENETIC TEST RESULTS: Genetic testing reported out on 02/09/2019 through  the [facility] cancer panel found no pathogenic variants. The Common Hereditary Gene Panel offered by [facility] includes sequencing and/or deletion duplication testing of the following 48 genes: APC, ATM, AXIN2, BARD1, BMPR1A, BRCA1, BRCA2, BRIP1, CDH1, CDK4, CDKN2A (p14ARF), CDKN2A (p16INK4a), CHEK2, CTNNA1, DICER1, EPCAM (Deletion/duplication testing only), GREM1 (promoter region deletion/duplication testing only), KIT, MEN1, MLH1, MSH2, MSH3, MSH6, MUTYH, NBN, NF1, NHTL1, PALB2, PDGFRA, PMS2, POLD1, POLE, PTEN, RAD50, RAD51C, RAD51D, RNF43, SDHB, SDHC, SDHD, SMAD4, SMARCA4. STK11, TP53, TSC1, TSC2, and VHL.  The following genes were evaluated for sequence changes only: SDHA and HOXB13 c.251G>A variant only. The test report will be scanned into EPIC and located under the Molecular Pathology section of the Results Review tab.  A portion of the result report is included below for reference.     We discussed with Ms. Fifield that because current genetic testing is not perfect, it is possible there may be a gene mutation in one of these genes that current testing cannot detect, but that chance is small.  We also discussed, that there could be another gene that has not yet been discovered, or that we have not yet tested, that is responsible for the cancer diagnoses in the family. It is also possible there is a hereditary cause for the cancer in the family that Ms. Zahniser did not inherit and therefore was not identified in her testing.  Therefore, it is important to remain in touch with cancer genetics in the future so that we can continue to offer Ms. Burkley the most up to date genetic testing.   ADDITIONAL GENETIC TESTING: We discussed with Ms. Birch that her genetic testing was fairly extensive.  If there are genes identified to increase cancer risk that can be analyzed in the future, we would be happy to discuss and coordinate this testing at that time.    CANCER SCREENING RECOMMENDATIONS: Ms. Barbar test result is  considered negative (normal).  This means that we have not identified a hereditary cause for her personal and family history of cancer at this time. Most cancers happen by chance and this negative test suggests that her cancer may fall into this category.    While reassuring, this does not definitively rule out a hereditary predisposition to cancer. It is still possible that there could be genetic mutations that are undetectable by current technology. There could be genetic mutations in genes that have not been tested or identified to increase cancer risk.  Therefore, it is recommended she continue to follow the cancer management and screening guidelines provided by her oncology and primary healthcare providers.   An individual's cancer risk and medical management are not determined by genetic test results alone. Overall cancer risk assessment incorporates additional factors, including personal medical history, family history, and any available genetic information that may result in a personalized plan for cancer prevention and surveillance  RECOMMENDATIONS FOR FAMILY MEMBERS:  Individuals in this family might be at some increased risk of developing cancer, over the general population risk, simply due to the family history of cancer.  We recommended women in  this family have a yearly mammogram beginning at age 44, or 30 years younger than the earliest onset of cancer, an annual clinical breast exam, and perform monthly breast self-exams. Women in this family should also have a gynecological exam as recommended by their primary provider. All family members should have a colonoscopy by age 81.  FOLLOW-UP: Lastly, we discussed with Ms. Boggio that cancer genetics is a rapidly advancing field and it is possible that new genetic tests will be appropriate for her and/or her family members in the future. We encouraged her to remain in contact with cancer genetics on an annual basis so we can update her personal and  family histories and let her know of advances in cancer genetics that may benefit this family.   Our contact number was provided. Ms. Shiflett questions were answered to her satisfaction, and she knows she is welcome to call us at anytime with additional questions or concerns.   Clint Guy, MS Genetic Counselor [email or web address] Phone: [phone]

## 2019-02-17 DIAGNOSIS — N852 Hypertrophy of uterus: Secondary | ICD-10-CM | POA: Diagnosis not present

## 2019-02-19 ENCOUNTER — Ambulatory Visit (AMBULATORY_SURGERY_CENTER): Payer: Self-pay | Admitting: *Deleted

## 2019-02-19 ENCOUNTER — Other Ambulatory Visit: Payer: Self-pay

## 2019-02-19 VITALS — Temp 96.9°F | Ht 65.0 in | Wt 112.0 lb

## 2019-02-19 DIAGNOSIS — Z1211 Encounter for screening for malignant neoplasm of colon: Secondary | ICD-10-CM

## 2019-02-19 MED ORDER — NA SULFATE-K SULFATE-MG SULF 17.5-3.13-1.6 GM/177ML PO SOLN: 1.0000 | Freq: Once | ORAL | 0 refills | Status: AC

## 2019-02-19 NOTE — Progress Notes (Signed)
No egg or soy allergy known to patient  No issues with past sedation with any surgeries  or procedures, no intubation problems  No diet pills per patient No home 02 use per patient  No blood thinners per patient  Pt denies issues with constipation pt uses stool softener daily and states stools are soft every 2-3 days. No A fib or A flutter  EMMI video sent to pt's e mail

## 2019-02-25 DIAGNOSIS — N840 Polyp of corpus uteri: Secondary | ICD-10-CM | POA: Diagnosis not present

## 2019-02-27 DIAGNOSIS — R9389 Abnormal findings on diagnostic imaging of other specified body structures: Secondary | ICD-10-CM | POA: Diagnosis not present

## 2019-03-04 ENCOUNTER — Telehealth: Payer: Self-pay

## 2019-03-04 NOTE — Telephone Encounter (Signed)
Covid-19 screening questions   Do you now or have you had a fever in the last 14 days? NO   Do you have any respiratory symptoms of shortness of breath or cough now or in the last 14 days? NO  Do you have any family members or close contacts with diagnosed or suspected Covid-19 in the past 14 days? NO  Have you been tested for Covid-19 and found to be positive? NO        

## 2019-03-05 ENCOUNTER — Ambulatory Visit (AMBULATORY_SURGERY_CENTER): Payer: Medicare Other | Admitting: Gastroenterology

## 2019-03-05 ENCOUNTER — Other Ambulatory Visit: Payer: Self-pay

## 2019-03-05 ENCOUNTER — Encounter: Payer: Self-pay | Admitting: Gastroenterology

## 2019-03-05 VITALS — BP 151/58 | HR 61 | Temp 97.4°F | Resp 20 | Ht 65.0 in | Wt 112.0 lb

## 2019-03-05 DIAGNOSIS — Z1211 Encounter for screening for malignant neoplasm of colon: Secondary | ICD-10-CM | POA: Diagnosis not present

## 2019-03-05 MED ORDER — SODIUM CHLORIDE 0.9 % IV SOLN: 500.0000 mL | Freq: Once | INTRAVENOUS | Status: DC

## 2019-03-05 NOTE — Progress Notes (Signed)
Pt Drowsy. VSS. To PACU, report to RN. No anesthetic complications noted.  

## 2019-03-05 NOTE — Patient Instructions (Signed)
Impression/Recommendations:  Repeat colonoscopy in 10 years for screening purposes.  Resume previous diet. Continue present medications.  YOU HAD AN ENDOSCOPIC PROCEDURE TODAY AT [facility]:   Refer to the procedure report that was given to you for any specific questions about what was found during the examination.  If the procedure report does not answer your questions, please call your gastroenterologist to clarify.  If you requested that your care partner not be given the details of your procedure findings, then the procedure report has been included in a sealed envelope for you to review at your convenience later.  YOU SHOULD EXPECT: Some feelings of bloating in the abdomen. Passage of more gas than usual.  Walking can help get rid of the air that was put into your GI tract during the procedure and reduce the bloating. If you had a lower endoscopy (such as a colonoscopy or flexible sigmoidoscopy) you may notice spotting of blood in your stool or on the toilet paper. If you underwent a bowel prep for your procedure, you may not have a normal bowel movement for a few days.  Please Note:  You might notice some irritation and congestion in your nose or some drainage.  This is from the oxygen used during your procedure.  There is no need for concern and it should clear up in a day or so.  SYMPTOMS TO REPORT IMMEDIATELY:   Following lower endoscopy (colonoscopy or flexible sigmoidoscopy):  Excessive amounts of blood in the stool  Significant tenderness or worsening of abdominal pains  Swelling of the abdomen that is new, acute  Fever of 100F or higher  For urgent or emergent issues, a gastroenterologist can be reached at any hour by calling [phone].   DIET:  We do recommend a small meal at first, but then you may proceed to your regular diet.  Drink plenty of fluids but you should avoid alcoholic beverages for 24 hours.  ACTIVITY:  You should plan to take it easy  for the rest of today and you should NOT DRIVE or use heavy machinery until tomorrow (because of the sedation medicines used during the test).    FOLLOW UP: Our staff will call the number listed on your records 48-72 hours following your procedure to check on you and address any questions or concerns that you may have regarding the information given to you following your procedure. If we do not reach you, we will leave a message.  We will attempt to reach you two times.  During this call, we will ask if you have developed any symptoms of COVID 19. If you develop any symptoms (ie: fever, flu-like symptoms, shortness of breath, cough etc.) before then, please call [phone].  If you test positive for Covid 19 in the 2 weeks post procedure, please call and report this information to [place].    If any biopsies were taken you will be contacted by phone or by letter within the next 1-3 weeks.  Please call us at [phone] if you have not heard about the biopsies in 3 weeks.    SIGNATURES/CONFIDENTIALITY: You and/or your care partner have signed paperwork which will be entered into your electronic medical record.  These signatures attest to the fact that that the information above on your After Visit Summary has been reviewed and is understood.  Full responsibility of the confidentiality of this discharge information lies with you and/or your care-partner.

## 2019-03-05 NOTE — Op Note (Signed)
[facility] Patient Name: Alexis [name] Procedure Date: 03/05/2019 10:27 AM MRN: [id number] Endoscopist: Ladene Artist , MD Age: 68 Referring MD:  Date of Birth: 1951-04-01 Gender: Female Account #: [id number] Procedure:                Colonoscopy Indications:              Screening for colorectal malignant neoplasm Medicines:                Monitored Anesthesia Care Procedure:                Pre-Anesthesia Assessment:                           - Prior to the procedure, a History and Physical                            was performed, and patient medications and                            allergies were reviewed. The patient's tolerance of                            previous anesthesia was also reviewed. The risks                            and benefits of the procedure and the sedation                            options and risks were discussed with the patient.                            All questions were answered, and informed consent                            was obtained. Prior Anticoagulants: The patient has                            taken no previous anticoagulant or antiplatelet                            agents. ASA Grade Assessment: II - A patient with                            mild systemic disease. After reviewing the risks                            and benefits, the patient was deemed in                            satisfactory condition to undergo the procedure.                           After obtaining informed consent, the colonoscope  was passed under direct vision. Throughout the                            procedure, the patient's blood pressure, pulse, and                            oxygen saturations were monitored continuously. The                            Colonoscope was introduced through the anus and                            advanced to the the cecum, identified by                            appendiceal orifice and  ileocecal valve. The                            ileocecal valve, appendiceal orifice, and rectum                            were photographed. The quality of the bowel                            preparation was excellent. The colonoscopy was                            performed without difficulty. The patient tolerated                            the procedure well. Scope In: 10:33:40 AM Scope Out: 10:48:48 AM Scope Withdrawal Time: 0 hours 10 minutes 16 seconds  Total Procedure Duration: 0 hours 15 minutes 8 seconds  Findings:                 The perianal and digital rectal examinations were                            normal.                           The entire examined colon appeared normal on direct                            and retroflexion views. Complications:            No immediate complications. Estimated blood loss:                            None. Estimated Blood Loss:     Estimated blood loss: none. Impression:               - The entire examined colon is normal on direct and                            retroflexion views.                           -  No specimens collected. Recommendation:           - Repeat colonoscopy in 10 years for screening                            purposes.                           - Patient has a contact number available for                            emergencies. The signs and symptoms of potential                            delayed complications were discussed with the                            patient. Return to normal activities tomorrow.                            Written discharge instructions were provided to the                            patient.                           - Resume previous diet.                           - Continue present medications. Ladene Artist, MD 03/05/2019 10:51:57 AM This report has been signed electronically.

## 2019-03-05 NOTE — Progress Notes (Signed)
Temp check by JB/vital check by CW.  No changes in medical or surgical history since pre-visit screening on 9/3.

## 2019-03-09 ENCOUNTER — Telehealth: Payer: Self-pay

## 2019-03-09 NOTE — Telephone Encounter (Signed)
  Follow up Call-  Call back number 03/05/2019  Post procedure Call Back phone  # [phone]  Permission to leave phone message Yes  Some recent data might be hidden     Patient questions:  Do you have a fever, pain , or abdominal swelling? No. Pain Score  0 *  Have you tolerated food without any problems? Yes.    Have you been able to return to your normal activities? Yes.    Do you have any questions about your discharge instructions: Diet   No. Medications  No. Follow up visit  No.  Do you have questions or concerns about your Care? No.  Actions: * If pain score is 4 or above: 1. No action needed, pain <4.Have you developed a fever since your procedure? no  2.   Have you had an respiratory symptoms (SOB or cough) since your procedure? no  3.   Have you tested positive for COVID 19 since your procedure no  4.   Have you had any family members/close contacts diagnosed with the COVID 19 since your procedure?  no   If yes to any of these questions please route to Joylene John, RN and Alphonsa Gin, Therapist, sports.

## 2019-03-11 DIAGNOSIS — N859 Noninflammatory disorder of uterus, unspecified: Secondary | ICD-10-CM | POA: Diagnosis not present

## 2019-04-29 DIAGNOSIS — N859 Noninflammatory disorder of uterus, unspecified: Secondary | ICD-10-CM | POA: Diagnosis not present

## 2019-06-15 ENCOUNTER — Other Ambulatory Visit: Payer: Self-pay

## 2019-06-15 ENCOUNTER — Ambulatory Visit (INDEPENDENT_AMBULATORY_CARE_PROVIDER_SITE_OTHER): Payer: Medicare Other | Admitting: Internal Medicine

## 2019-06-15 ENCOUNTER — Encounter: Payer: Self-pay | Admitting: Internal Medicine

## 2019-06-15 DIAGNOSIS — R002 Palpitations: Secondary | ICD-10-CM

## 2019-06-15 DIAGNOSIS — D509 Iron deficiency anemia, unspecified: Secondary | ICD-10-CM | POA: Diagnosis not present

## 2019-06-15 DIAGNOSIS — E785 Hyperlipidemia, unspecified: Secondary | ICD-10-CM

## 2019-06-15 DIAGNOSIS — Z0000 Encounter for general adult medical examination without abnormal findings: Secondary | ICD-10-CM

## 2019-06-15 DIAGNOSIS — M818 Other osteoporosis without current pathological fracture: Secondary | ICD-10-CM

## 2019-06-15 DIAGNOSIS — M1612 Unilateral primary osteoarthritis, left hip: Secondary | ICD-10-CM

## 2019-06-15 LAB — LIPID PANEL
Cholesterol: 136 mg/dL (ref 0–200)
HDL: 45.5 mg/dL (ref >39.00)
LDL Cholesterol: 67 mg/dL (ref 0–99)
NonHDL: 90.14
Total CHOL/HDL Ratio: 3
Triglycerides: 118 mg/dL (ref 0.0–149.0)
VLDL: 23.6 mg/dL (ref 0.0–40.0)

## 2019-06-15 LAB — CBC WITH DIFFERENTIAL/PLATELET
Basophils Absolute: 0 10*3/uL (ref 0.0–0.1)
Basophils Relative: 0.6 % (ref 0.0–3.0)
Eosinophils Absolute: 0.1 10*3/uL (ref 0.0–0.7)
Eosinophils Relative: 2.8 % (ref 0.0–5.0)
HCT: 35.7 % — ABNORMAL LOW (ref 36.0–46.0)
Hemoglobin: 11.8 g/dL — ABNORMAL LOW (ref 12.0–15.0)
Lymphocytes Relative: 27.1 % (ref 12.0–46.0)
Lymphs Abs: 1.3 10*3/uL (ref 0.7–4.0)
MCHC: 33 g/dL (ref 30.0–36.0)
MCV: 90.5 fl (ref 78.0–100.0)
Monocytes Absolute: 0.5 10*3/uL (ref 0.1–1.0)
Monocytes Relative: 10.5 % (ref 3.0–12.0)
Neutro Abs: 2.9 10*3/uL (ref 1.4–7.7)
Neutrophils Relative %: 59 % (ref 43.0–77.0)
Platelets: 131 10*3/uL — ABNORMAL LOW (ref 150.0–400.0)
RBC: 3.94 Mil/uL (ref 3.87–5.11)
RDW: 13.6 % (ref 11.5–15.5)
WBC: 5 10*3/uL (ref 4.0–10.5)

## 2019-06-15 LAB — BASIC METABOLIC PANEL
BUN: 19 mg/dL (ref 6–23)
CO2: 31 mEq/L (ref 19–32)
Calcium: 9 mg/dL (ref 8.4–10.5)
Chloride: 104 mEq/L (ref 96–112)
Creatinine, Ser: 0.68 mg/dL (ref 0.40–1.20)
GFR: 85.7 mL/min (ref >60.00)
Glucose, Bld: 96 mg/dL (ref 70–99)
Potassium: 3.6 mEq/L (ref 3.5–5.1)
Sodium: 141 mEq/L (ref 135–145)

## 2019-06-15 LAB — TSH: TSH: 2.28 u[IU]/mL (ref 0.35–4.50)

## 2019-06-15 LAB — HEPATIC FUNCTION PANEL
ALT: 19 U/L (ref 0–35)
AST: 26 U/L (ref 0–37)
Albumin: 4 g/dL (ref 3.5–5.2)
Alkaline Phosphatase: 34 U/L — ABNORMAL LOW (ref 39–117)
Bilirubin, Direct: 0.1 mg/dL (ref 0.0–0.3)
Total Bilirubin: 0.5 mg/dL (ref 0.2–1.2)
Total Protein: 6.4 g/dL (ref 6.0–8.3)

## 2019-06-15 NOTE — Patient Instructions (Addendum)
Call Dr Lorin Mercy about your R back and leg pain   Please remember to go to the lab department   for your tests - we will call you with the results when they are available.   We will call to set you up for bone density    Please schedule a follow up visit in 12 months but call sooner if needed

## 2019-06-15 NOTE — Progress Notes (Signed)
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 Annual Exam    Pt is fasting. Pt states she has noticed some palpitations recently- last episode was approx 2 wks ago.   palpitations s presyncope  last x 2-3 min once a month  then feels drained ffor the rest of the day "same as in past" Excess mucus x one week with sense of excess pnds but no sneezing or obvious rhinorrhea and this doesn't disturb sleep rec Stop clariton and reduce caffeine use  For drainage / throat tickle try take CHLORPHENIRAMINE  4 mg -prn  Use the ear wax remover on your Left ear (over the counter)      06/06/2018  f/u ov/Alexis [name] re:   Hyperlipidemia / chronic rhinitis with pnds/cough /mvp with h/o  palpitations Chief Complaint  Patient presents with  . Annual Exam    She is doing well and denies any co's.   Dyspnea: Not limited by breathing from desired activities   Cough: pnds/burning first thing in am  x years no better on clariton - did not try h1 as rec  Sleeping: flat bed  rec Pepcid 20 mg one hour before bedtime For drainage / throat tickle try take CHLORPHENIRAMINE  4 mg (chlortabs at [facility]) - take one every 4 hours as needed - available over the counter- may cause drowsiness so start with just a dose or two  One hour before bedtime and see how  you tolerate it before trying in daytime   GERD  Diet    11/13/2018  f/u ov/Alexis [name] re: hyperlipidemia / chronic rhinitis with pnds vs grerd  Chief Complaint  Patient presents with  . Follow-up    Doing well, has occ burning in her throat but this has been better after cutting out some caffeine.    Dyspnea:  Not limited by breathing from desired activities   Cough: am burning still present, not as often while  on h1 one H1 (never tried two as rec) , no h2  Sleeping: fine flat SABA use: none 02: none  rec Pepcid 20 mg - take x one tablet x one hour before bedtime For drainage / throat tickle try take CHLORPHENIRAMINE  4 mg (chlortabs at [facility]) - take one every 4 hours as needed  GERD diet   06/15/2019  f/u ov/Alexis [name] re:   Annual eval multiple problems and new R hip/back pain Chief Complaint  Patient presents with  . Annual Exam    Pt is fasting. Pt c/o right reg pain and back pain x 2-3 months.   Dyspnea:  Not limited by breathing from desired activities  / no power walking but on her feet all day Cough: gone Sleeping: flat/one pillow SABA use: none 02: none  Pain radiates back to  lateral thigh and just below the knee / no worse with coughing/ no weakness or numbness    No obvious day to day or daytime variability or assoc excess/ purulent sputum or mucus plugs or hemoptysis or cp or chest tightness, subjective wheeze or overt sinus or hb symptoms.   Sleeping without nocturnal  or early am exacerbation  of respiratory  c/o's or need for noct saba. Also denies any obvious fluctuation of symptoms with weather or environmental changes or other aggravating or alleviating factors except as outlined above   No unusual exposure hx or h/o childhood pna/ asthma or knowledge of premature birth.  Current Allergies, Complete Past Medical History, Past Surgical History, Family History, and Social History were reviewed in [employer] record.  ROS  The following are not  active complaints unless bolded Hoarseness, sore throat, dysphagia, dental problems, itching, sneezing,  nasal congestion or discharge of excess mucus or purulent secretions, ear ache,   fever, chills, sweats, unintended wt loss or wt gain, classically pleuritic or exertional cp,  orthopnea pnd or arm/hand swelling  or leg swelling, presyncope, palpitations, abdominal pain, anorexia, nausea, vomiting, diarrhea  or change in bowel habits or change in bladder habits, change in stools or change in urine, dysuria, hematuria,  rash, arthralgias, visual complaints, headache, numbness, weakness or ataxia or problems with walking or coordination,  change in mood or  memory.        Current Meds  Medication Sig  . aspirin EC 81 MG tablet Take 81 mg by mouth daily.  . B Complex-C (SUPER B COMPLEX/VITAMIN C PO) Take 1 tablet by mouth daily.  . Calcium Carbonate-Vitamin D (CALCIUM 600/VITAMIN D PO) Take 1 tablet by mouth daily.  Marland Kitchen docusate sodium (COLACE) 100 MG capsule Take 100 mg by mouth 2 (two) times daily.  . pravastatin (PRAVACHOL) 40 MG tablet TAKE 1 TABLET BY MOUTH IN THE EVENING  . psyllium (REGULOID) 0.52 g capsule Take 0.52 g by mouth daily.  . tamoxifen (NOLVADEX) 20 MG tablet Take 1 tablet (20 mg total) by mouth at bedtime.                 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 Alexis [name] - R hip pain rad to thigh onset 02/2019  HEALTH MAINTENANCE............................................................  Alexis [name]  - DT 04/21/2015 - Prevnar  04/23/16  - CPX   06/15/2019  - GYN care [name] > says told no longer needed any gyn health maint as per pt at  ov 04/30/2017        Family History:  Diabetes- Brothers  Hyperlipidemia- Mother  MI/Heart Attack- Father  Breast cancer mother  and father's sister     Social History:  Patient never smoked.  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  Dry wax 50% obst both ears    NECK :  without JVD/Nodes/TM/ nl carotid upstrokes bilaterally s bruits   LUNGS: no acc muscle use,  Nl contour chest which is clear to A and P bilaterally without cough on insp or exp maneuvers   CV:  RRR  no s3 or murmur or increase in P2, and no edema / post tibs ysym biltarlly   ABD:  soft and nontender with nl inspiratory excursion in the supine position. No bruits or organomegaly appreciated, bowel sounds nl  MS:  Nl gait/ ext warm without deformities, calf tenderness,  cyanosis or clubbing No obvious joint restrictions / neg SLR or R   SKIN: warm and dry without lesions    NEURO:  alert, approp, nl sensorium with  no motor or cerebellar deficits apparent.    ekg 06/15/2019   NSR  slt decreased precordial RWP, no change since 06/06/18          Labs ordered/ reviewed:      Chemistry      Component Value Date/Time   NA 141 06/15/2019 0912   NA 144 12/26/2016 1105   K 3.6 06/15/2019 0912   K 4.0 12/26/2016 1105   CL 104 06/15/2019 0912   CO2 31 06/15/2019 0912   CO2 29 12/26/2016 1105   BUN 19 06/15/2019 0912   BUN 12.2 12/26/2016 1105   CREATININE 0.68 06/15/2019 0912   CREATININE 0.72 02/03/2019 1157   CREATININE 0.7 12/26/2016 1105      Component Value Date/Time   CALCIUM 9.0 06/15/2019 0912   CALCIUM 9.7 12/26/2016 1105   ALKPHOS 34 (L) 06/15/2019 0912   ALKPHOS 49 12/26/2016 1105   AST 26 06/15/2019 0912   AST 26 02/03/2019 1157   AST 22 12/26/2016 1105   ALT 19 06/15/2019 0912   ALT 20 02/03/2019 1157   ALT 17 12/26/2016 1105   BILITOT 0.5 06/15/2019 0912   BILITOT 0.5 02/03/2019 1157   BILITOT 0.57 12/26/2016 1105        Lab Results  Component Value Date   WBC 5.0 06/15/2019   HGB 11.8 (L) 06/15/2019   HCT 35.7 (L) 06/15/2019   MCV 90.5 06/15/2019   PLT 131.0 (L) 06/15/2019       Lab Results  Component Value Date   TSH 2.28 06/15/2019                                Assessment & [name]:

## 2019-06-16 ENCOUNTER — Encounter: Payer: Self-pay | Admitting: Internal Medicine

## 2019-06-16 NOTE — Assessment & Plan Note (Signed)
Now with pain R back / hip appears more radicular than hip related with Neg SLR and pain not rad all the way to foot > referred back to ortho [facility]) to sort out

## 2019-06-16 NOTE — Assessment & Plan Note (Signed)
Target LDL < 160 in absence of risk factors  Lab Results  Component Value Date   CHOL 136 06/15/2019   HDL 45.50 06/15/2019   LDLCALC 67 06/15/2019   LDLDIRECT 151.4 07/04/2010   TRIG 118.0 06/15/2019   CHOLHDL 3 06/15/2019    lft's ok > Adequate control on present rx, reviewed in detail with pt > no change in rx needed

## 2019-06-16 NOTE — Assessment & Plan Note (Signed)
-   DEXA 07/04/06 Tspine .03, Left hip -.94, L fem Neck -1.4  - DEXA 07/12/08 Tspine -.2 Left F -.7 Right Fem -.4  - DEXA 08/15/10  T spine - 0.3, LF -0.8  RF -0.8  - DEXA 04/16/14 T score is -0.9 at the femoral neck- in the normal range Dual hip measurement is -1.6 - in the osteopenia range Spine score may be falsely elevated due to scoliosis  - DEXA req 06/15/2019   Advised on adeq calcium and vit D and wt bearing ex

## 2019-06-16 NOTE — Assessment & Plan Note (Signed)
Better off caffeine   Advised re covid 19 vaccinations/ strongly encouraged to take when avail    >>> f/u yearly - call sooner if needed

## 2019-06-16 NOTE — Assessment & Plan Note (Signed)
H/o fe def  1999, recurred early 2017 while donating blood - rx NuIron  07/29/15 >>>  Resolved  - recurrent microcytic anemia 04/30/2017  > resolved     Lab Results  Component Value Date   HGB 11.8 (L) 06/15/2019   HGB 11.9 (L) 02/03/2019   HGB 12.2 01/21/2019   HGB 11.4 (L) 06/06/2018   HGB 11.7 12/26/2016   HGB 11.7 02/22/2016   HGB 11.6 11/23/2015     Has not recurred since stopped blood donations

## 2019-06-18 ENCOUNTER — Ambulatory Visit (INDEPENDENT_AMBULATORY_CARE_PROVIDER_SITE_OTHER)
Admission: RE | Admit: 2019-06-18 | Discharge: 2019-06-18 | Disposition: A | Discharge status: Home / Self Care | Payer: Medicare Other | Source: Ambulatory Visit | Attending: Internal Medicine | Admitting: Internal Medicine

## 2019-06-18 ENCOUNTER — Other Ambulatory Visit: Payer: Self-pay

## 2019-06-18 DIAGNOSIS — M818 Other osteoporosis without current pathological fracture: Secondary | ICD-10-CM

## 2019-06-22 ENCOUNTER — Telehealth: Payer: Self-pay | Admitting: Internal Medicine

## 2019-06-22 MED ORDER — ALENDRONATE SODIUM 70 MG PO TABS: 70.0000 mg | ORAL_TABLET | ORAL | 11 refills | Status: DC

## 2019-06-22 NOTE — Progress Notes (Signed)
LMTCB x 1 

## 2019-06-22 NOTE — Telephone Encounter (Signed)
Tanda Rockers, MD sent to [facility], [place]  Call patient : Alexis [name] is c/w worsening osteopenia or early osteoporosis  Rec rec fosfamax 70 mg q week (taken one hour before eating and remain upright the entire time / vit d 800 / calcium 1500 mg daily (tums es tid will do or can take caltrate D over the counter and cover both issues with one pill but need to read the label on the bottle to get the totals near these levels)        Spoke with pt and notified of results per Dr. [name]. Pt verbalized understanding and denied any questions. Rx was sent

## 2019-07-29 DIAGNOSIS — N840 Polyp of corpus uteri: Secondary | ICD-10-CM | POA: Diagnosis not present

## 2019-08-13 DIAGNOSIS — Z23 Encounter for immunization: Secondary | ICD-10-CM | POA: Diagnosis not present

## 2019-08-23 ENCOUNTER — Other Ambulatory Visit: Payer: Self-pay | Admitting: Internal Medicine

## 2019-08-27 ENCOUNTER — Other Ambulatory Visit: Payer: Self-pay | Admitting: Oncology

## 2019-08-27 DIAGNOSIS — Z853 Personal history of malignant neoplasm of breast: Secondary | ICD-10-CM

## 2019-09-11 DIAGNOSIS — Z23 Encounter for immunization: Secondary | ICD-10-CM | POA: Diagnosis not present

## 2019-10-22 ENCOUNTER — Ambulatory Visit
Admission: EM | Admit: 2019-10-22 | Discharge: 2019-10-22 | Disposition: A | Discharge status: Home / Self Care | Payer: Medicare Other | Attending: Emergency Medicine | Admitting: Emergency Medicine

## 2019-10-22 ENCOUNTER — Ambulatory Visit (INDEPENDENT_AMBULATORY_CARE_PROVIDER_SITE_OTHER): Payer: Medicare Other

## 2019-10-22 DIAGNOSIS — S6992XA Unspecified injury of left wrist, hand and finger(s), initial encounter: Secondary | ICD-10-CM | POA: Diagnosis not present

## 2019-10-22 DIAGNOSIS — M25532 Pain in left wrist: Secondary | ICD-10-CM

## 2019-10-22 DIAGNOSIS — S8992XA Unspecified injury of left lower leg, initial encounter: Secondary | ICD-10-CM | POA: Diagnosis not present

## 2019-10-22 DIAGNOSIS — M25462 Effusion, left knee: Secondary | ICD-10-CM | POA: Diagnosis not present

## 2019-10-22 MED ORDER — PREDNISONE 20 MG PO TABS
20.0000 mg | ORAL_TABLET | Freq: Every day | ORAL | 0 refills | Status: AC
Start: 2019-10-22 — End: 2019-10-27

## 2019-10-22 NOTE — Discharge Instructions (Signed)
LT wrist x-rays positive for severe osteoarthritis Declines knee x-rays at this time  Apx 12-15 cc drained from LT knee Fluid on the knee is most likely from the injury you had Draining the fluid should help relief your symptoms Continue conservative management of rest, ice, and elevate Prescription for prednisone prescribed.  Use as directed and to completion Follow up with orthopedist for further evaluation and management Return or go to the ER if you have any new or worsening symptoms (fever, chills, chest pain, abdominal pain, changes in bowel or bladder habits, pain radiating into lower legs, etc...)

## 2019-10-22 NOTE — ED Provider Notes (Signed)
[place]   [id number] 10/22/19 Arrival Time: [id number]  CC: LT knee and LT wrist  SUBJECTIVE: History from: patient. TATYANAH FRANE is a 69 y.o. female complains of LT knee discomfort  x 1 month.  Had fall on LT knee 1 month ago.  Complains of associated LT knee swelling.  Localizes the pain to the anterior knee.  Describes the pain as intermittent.  Worse with turing over at night.  Has tried OTC medications without relief.  Denies similar symptoms in the past.  Denies fever, chills, erythema, ecchymosis, weakness, numbness and tingling.  Also mentions bumps on LT wrist that appear to be under the skin x 2 months.  Denies injury.  Denies pain.  Requests x-ray  ROS: As per HPI.  All other pertinent ROS negative.     Past Medical History:  Diagnosis Date  . Allergy   . Asthma    environmental now resolved  . Breast cancer ([place]) 11/16/2015   Right Breast  . Chronic fatigue   . Cough   . Family history of breast cancer   . Family history of lung cancer   . GERD (gastroesophageal reflux disease)   . Heart murmur    "years ago, when I was young"  . Hemangioma   . History of hiatal hernia   . Hyperlipidemia   . IBS (irritable bowel syndrome)   . Iron deficiency anemia   . Kyphosis of thoracic region   . MVP (mitral valve prolapse)   . Osteopenia   . SAH (subarachnoid hemorrhage) ([place])   . Vertigo    Past Surgical History:  Procedure Laterality Date  . APPENDECTOMY    . BREAST BIOPSY Right 11/16/2015   U/S Core- Malignant  . BREAST BIOPSY Left 06/21/2005   Stereo- Benign  . BREAST EXCISIONAL BIOPSY Left 2007  . BREAST LUMPECTOMY Right 12/22/2015  . BREAST LUMPECTOMY WITH RADIOACTIVE SEED AND SENTINEL LYMPH NODE BIOPSY Right 12/22/2015   Procedure: BREAST LUMPECTOMY WITH RADIOACTIVE SEED AND SENTINEL LYMPH NODE BIOPSY;  Surgeon: Alphonsa Overall, MD;  Location: [facility];  Service: General;  Laterality: Right;  . COLONOSCOPY    . FOOT SURGERY Right    bunionectomy  . MOUTH SURGERY    . [facility]  2003   s/p embolization per Dr. Estanislado Pandy  . TONSILLECTOMY    . TOTAL HIP ARTHROPLASTY Left 01/11/2017   Procedure: LEFT TOTAL HIP ARTHROPLASTY ANTERIOR APPROACH;  Surgeon: [name], MD;  Location: [place];  Service: Orthopedics;  Laterality: Left;  . TUBAL LIGATION    . UPPER GASTROINTESTINAL ENDOSCOPY     Allergies  Allergen Reactions  . Other Itching    Jell-o  . Latex Rash    Contact rash   No current facility-administered medications on file prior to encounter.   Current Outpatient Medications on File Prior to Encounter  Medication Sig Dispense Refill  . alendronate (FOSAMAX) 70 MG tablet Take 1 tablet (70 mg total) by mouth once a week. Take with a full glass of water on an empty stomach. 4 tablet 11  . aspirin EC 81 MG tablet Take 81 mg by mouth daily.    . B Complex-C (SUPER B COMPLEX/VITAMIN C PO) Take 1 tablet by mouth daily.    . Calcium Carbonate-Vitamin D (CALCIUM 600/VITAMIN D PO) Take 1 tablet by mouth daily.    Marland Kitchen docusate sodium (COLACE) 100 MG capsule Take 100 mg by mouth 2 (two) times daily.    . pravastatin (PRAVACHOL) 40  MG tablet TAKE 1 TABLET BY MOUTH EVERY DAY IN THE EVENING 90 tablet 3  . psyllium (REGULOID) 0.52 g capsule Take 0.52 g by mouth daily.    . tamoxifen (NOLVADEX) 20 MG tablet Take 1 tablet (20 mg total) by mouth at bedtime. 90 tablet 4   Social History   Socioeconomic History  . Marital status: Married    Spouse name: Not on file  . Number of children: Not on file  . Years of education: Not on file  . Highest education level: Not on file  Occupational History  . Occupation: CSR  Tobacco Use  . Smoking status: Never Smoker  . Smokeless tobacco: Never Used  Substance and Sexual Activity  . Alcohol use: No  . Drug use: No  . Sexual activity: Not on file  Other Topics Concern  . Not on file  Social History Narrative  . Not on file   Social Determinants of Health   Financial Resource  Strain:   . Difficulty of Paying Living Expenses:   Food Insecurity:   . Worried About Charity fundraiser in the Last Year:   . Arboriculturist in the Last Year:   Transportation Needs:   . Film/video editor (Medical):   Marland Kitchen Lack of Transportation (Non-Medical):   Physical Activity:   . Days of Exercise per Week:   . Minutes of Exercise per Session:   Stress:   . Feeling of Stress :   Social Connections:   . Frequency of Communication with Friends and Family:   . Frequency of Social Gatherings with Friends and Family:   . Attends Religious Services:   . Active Member of Clubs or Organizations:   . Attends Archivist Meetings:   Marland Kitchen Marital Status:   Intimate Partner Violence:   . Fear of Current or Ex-Partner:   . Emotionally Abused:   Marland Kitchen Physically Abused:   . Sexually Abused:    Family History  Problem Relation Age of Onset  . Hyperlipidemia Mother   . Breast cancer Mother 71  . Heart attack Father   . Diabetes Brother   . Breast cancer Maternal Aunt        diagnosed 100s or 44s  . Breast cancer Paternal Aunt 42       diagnosed in her 59s  . Lung cancer Paternal Uncle   . Colon cancer Neg Hx   . Colon polyps Neg Hx   . Esophageal cancer Neg Hx   . Rectal cancer Neg Hx   . Stomach cancer Neg Hx     OBJECTIVE:  Vitals:   10/22/19 1121  BP: (!) 146/73  Pulse: 65  Resp: 20  Temp: 97.9 F (36.6 C)  SpO2: 96%    General appearance: ALERT; in no acute distress.  Head: NCAT Lungs: Normal respiratory effort Musculoskeletal: LT knee/ LT wrist Inspection: Prepatellar effusion over LT knee apx 4x4 cm in diameter; bony nodules over LT base of thumb Palpation: Nontender to palpation (knee and LT wrist) ROM: FROM active and passive Skin: warm and dry Neurologic: Ambulates without difficulty; Sensation intact about the upper/ lower extremities Psychological: alert and cooperative; normal mood and affect  DIAGNOSTIC STUDIES:  DG Wrist Complete  Left  Result Date: 10/22/2019 CLINICAL DATA:  Left wrist injury 1 month ago. Persistent wrist pain. Initial encounter. EXAM: LEFT WRIST - COMPLETE 3+ VIEW COMPARISON:  None. FINDINGS: There is no evidence of fracture or dislocation. Severe osteoarthritis is seen involving the  base of the thumb. No other osseous abnormality identified. Soft tissues are unremarkable. IMPRESSION: No acute findings. Severe osteoarthritis of base of thumb. Electronically Signed   By: Marlaine Hind M.D.   On: 10/22/2019 12:17     X-rays negative for bony abnormalities including fracture, or dislocation.  No soft tissue swelling.    I have reviewed the x-rays myself and the radiologist interpretation. I am in agreement with the radiologist interpretation.     PROCEDURE:   Verbal consent obtained. Area over induration cleaned with chlorhexidine.  21 gauge needle introduce into superior medial aspect of knee over effusion.  Apx 12-15 cc of serosanguinous fluid drained from knee.  Patient tolerated procedure well.   Minimal bleeding. No complications.  ASSESSMENT & PLAN:  1. Effusion of left knee   2. Injury of left knee, initial encounter      Meds ordered this encounter  Medications  . predniSONE (DELTASONE) 20 MG tablet    Sig: Take 1 tablet (20 mg total) by mouth daily with breakfast for 5 days.    Dispense:  5 tablet    Refill:  0    Order Specific Question:   Supervising Provider    Answer:   Raylene Everts [id number]   LT wrist x-rays positive for severe osteoarthritis Declines knee x-rays at this time  Apx 12-15 cc drained from LT knee Fluid on the knee is most likely from the injury you had Draining the fluid should help relief your symptoms Continue conservative management of rest, ice, and elevate Prescription for prednisone prescribed.  Use as directed and to completion Follow up with orthopedist for further evaluation and management Return or go to the ER if you have any new or worsening  symptoms (fever, chills, chest pain, abdominal pain, changes in bowel or bladder habits, pain radiating into lower legs, etc...)    Reviewed expectations re: course of current medical issues. Questions answered. Outlined signs and symptoms indicating need for more acute intervention. Patient verbalized understanding. After Visit Summary given.    Lestine Box, PA-C 10/22/19 1228

## 2019-10-22 NOTE — ED Triage Notes (Signed)
Pt presents with injury to left knee and left wrist from a month ago. Pt continues to have swelling over knee and pain is at night and will awaken patient

## 2019-10-30 ENCOUNTER — Ambulatory Visit: Payer: Self-pay | Admitting: Physician Assistant

## 2019-11-17 ENCOUNTER — Other Ambulatory Visit: Payer: Self-pay | Admitting: Oncology

## 2019-11-17 ENCOUNTER — Ambulatory Visit
Admission: RE | Admit: 2019-11-17 | Discharge: 2019-11-17 | Disposition: A | Discharge status: Home / Self Care | Payer: Medicare Other | Source: Ambulatory Visit | Attending: Oncology | Admitting: Oncology

## 2019-11-17 ENCOUNTER — Other Ambulatory Visit: Payer: Self-pay

## 2019-11-17 DIAGNOSIS — N644 Mastodynia: Secondary | ICD-10-CM

## 2019-11-17 DIAGNOSIS — Z853 Personal history of malignant neoplasm of breast: Secondary | ICD-10-CM | POA: Diagnosis not present

## 2019-11-17 DIAGNOSIS — R922 Inconclusive mammogram: Secondary | ICD-10-CM | POA: Diagnosis not present

## 2019-11-17 HISTORY — DX: Personal history of irradiation: Z92.3

## 2019-12-30 DIAGNOSIS — H5203 Hypermetropia, bilateral: Secondary | ICD-10-CM | POA: Diagnosis not present

## 2019-12-30 DIAGNOSIS — H52223 Regular astigmatism, bilateral: Secondary | ICD-10-CM | POA: Diagnosis not present

## 2019-12-30 DIAGNOSIS — H25813 Combined forms of age-related cataract, bilateral: Secondary | ICD-10-CM | POA: Diagnosis not present

## 2019-12-30 DIAGNOSIS — H524 Presbyopia: Secondary | ICD-10-CM | POA: Diagnosis not present

## 2020-01-15 ENCOUNTER — Other Ambulatory Visit: Payer: Self-pay

## 2020-01-15 ENCOUNTER — Ambulatory Visit
Admission: EM | Admit: 2020-01-15 | Discharge: 2020-01-15 | Disposition: A | Discharge status: Home / Self Care | Payer: Medicare Other

## 2020-01-15 DIAGNOSIS — H938X3 Other specified disorders of ear, bilateral: Secondary | ICD-10-CM | POA: Diagnosis not present

## 2020-01-15 DIAGNOSIS — J309 Allergic rhinitis, unspecified: Secondary | ICD-10-CM | POA: Diagnosis not present

## 2020-01-15 DIAGNOSIS — H6122 Impacted cerumen, left ear: Secondary | ICD-10-CM

## 2020-01-15 NOTE — ED Triage Notes (Signed)
Pt presents with ear fullness

## 2020-01-15 NOTE — ED Provider Notes (Signed)
[place]   [id number] 01/15/20 Arrival Time: 9470  CC: EAR PAIN  SUBJECTIVE: History from: patient.  Alexis [name] is a 69 y.o. female who presents with bilateral ear fullness for the last past 3 days. Reports that she has been using peroxide to clean her ears for the last few days. Reports that she has had to have her ears cleaned out in the office before. Reports itchy, watery eyes as well. Denies fever, chills, fatigue, sinus pain, rhinorrhea, ear discharge, sore throat, SOB, wheezing, chest pain, nausea, changes in bowel or bladder habits.    ROS: As per HPI.  All other pertinent ROS negative.     Past Medical History:  Diagnosis Date  . Allergy   . Asthma    environmental now resolved  . Breast cancer ([place]) 11/16/2015   Right Breast  . Chronic fatigue   . Cough   . Family history of breast cancer   . Family history of lung cancer   . GERD (gastroesophageal reflux disease)   . Heart murmur    "years ago, when I was young"  . Hemangioma   . History of hiatal hernia   . Hyperlipidemia   . IBS (irritable bowel syndrome)   . Iron deficiency anemia   . Kyphosis of thoracic region   . MVP (mitral valve prolapse)   . Osteopenia   . Personal history of radiation therapy   . SAH (subarachnoid hemorrhage) ([place])   . Vertigo    Past Surgical History:  Procedure Laterality Date  . APPENDECTOMY    . BREAST BIOPSY Right 11/16/2015   U/S Core- Malignant  . BREAST BIOPSY Left 06/21/2005   Stereo- Benign  . BREAST EXCISIONAL BIOPSY Left 2007  . BREAST LUMPECTOMY Right 12/22/2015  . BREAST LUMPECTOMY WITH RADIOACTIVE SEED AND SENTINEL LYMPH NODE BIOPSY Right 12/22/2015   Procedure: BREAST LUMPECTOMY WITH RADIOACTIVE SEED AND SENTINEL LYMPH NODE BIOPSY;  Surgeon: Alphonsa Overall, MD;  Location: [facility];  Service: General;  Laterality: Right;  . COLONOSCOPY    . FOOT SURGERY Right    bunionectomy  . MOUTH SURGERY    . [facility]  2003   s/p embolization per  Dr. Estanislado Pandy  . TONSILLECTOMY    . TOTAL HIP ARTHROPLASTY Left 01/11/2017   Procedure: LEFT TOTAL HIP ARTHROPLASTY ANTERIOR APPROACH;  Surgeon: [name], MD;  Location: [place];  Service: Orthopedics;  Laterality: Left;  . TUBAL LIGATION    . UPPER GASTROINTESTINAL ENDOSCOPY     Allergies  Allergen Reactions  . Other Itching    Jell-o  . Latex Rash    Contact rash   No current facility-administered medications on file prior to encounter.   Current Outpatient Medications on File Prior to Encounter  Medication Sig Dispense Refill  . alendronate (FOSAMAX) 70 MG tablet Take 1 tablet (70 mg total) by mouth once a week. Take with a full glass of water on an empty stomach. 4 tablet 11  . aspirin EC 81 MG tablet Take 81 mg by mouth daily.    . B Complex-C (SUPER B COMPLEX/VITAMIN C PO) Take 1 tablet by mouth daily.    . Calcium Carbonate-Vitamin D (CALCIUM 600/VITAMIN D PO) Take 1 tablet by mouth daily.    Marland Kitchen docusate sodium (COLACE) 100 MG capsule Take 100 mg by mouth 2 (two) times daily.    . pravastatin (PRAVACHOL) 40 MG tablet TAKE 1 TABLET BY MOUTH EVERY DAY IN THE EVENING 90 tablet 3  .  psyllium (REGULOID) 0.52 g capsule Take 0.52 g by mouth daily.    . tamoxifen (NOLVADEX) 20 MG tablet Take 1 tablet (20 mg total) by mouth at bedtime. 90 tablet 4   Social History   Socioeconomic History  . Marital status: Married    Spouse name: Not on file  . Number of children: Not on file  . Years of education: Not on file  . Highest education level: Not on file  Occupational History  . Occupation: CSR  Tobacco Use  . Smoking status: Never Smoker  . Smokeless tobacco: Never Used  Vaping Use  . Vaping Use: Never used  Substance and Sexual Activity  . Alcohol use: No  . Drug use: No  . Sexual activity: Not on file  Other Topics Concern  . Not on file  Social History Narrative  . Not on file   Social Determinants of Health   Financial Resource Strain:   . Difficulty of Paying  Living Expenses:   Food Insecurity:   . Worried About Charity fundraiser in the Last Year:   . Arboriculturist in the Last Year:   Transportation Needs:   . Film/video editor (Medical):   Marland Kitchen Lack of Transportation (Non-Medical):   Physical Activity:   . Days of Exercise per Week:   . Minutes of Exercise per Session:   Stress:   . Feeling of Stress :   Social Connections:   . Frequency of Communication with Friends and Family:   . Frequency of Social Gatherings with Friends and Family:   . Attends Religious Services:   . Active Member of Clubs or Organizations:   . Attends Archivist Meetings:   Marland Kitchen Marital Status:   Intimate Partner Violence:   . Fear of Current or Ex-Partner:   . Emotionally Abused:   Marland Kitchen Physically Abused:   . Sexually Abused:    Family History  Problem Relation Age of Onset  . Hyperlipidemia Mother   . Breast cancer Mother 17  . Heart attack Father   . Diabetes Brother   . Breast cancer Maternal Aunt        diagnosed 80s or 101s  . Breast cancer Paternal Aunt 61       diagnosed in her 83s  . Lung cancer Paternal Uncle   . Colon cancer Neg Hx   . Colon polyps Neg Hx   . Esophageal cancer Neg Hx   . Rectal cancer Neg Hx   . Stomach cancer Neg Hx     OBJECTIVE:  Vitals:   01/15/20 1006  BP: (!) 166/60  Pulse: 65  Resp: 16  Temp: 97.8 F (36.6 C)  TempSrc: Oral  SpO2: 98%     General appearance: alert; appears fatigued HEENT: Ears: L EAC with cerumen impaction R EAC clear, TMs pearly gray with visible cone of light, without erythema; Eyes: PERRL, EOMI grossly; Sinuses nontender to palpation; Nose: clear rhinorrhea; Throat: oropharynx mildly erythematous, tonsils 1+ without white tonsillar exudates, uvula midline Neck: supple without LAD Lungs: unlabored respirations, symmetrical air entry; cough: absent; no respiratory distress Heart: regular rate and rhythm.  Radial pulses 2+ symmetrical bilaterally Skin: warm and  dry Psychological: alert and cooperative; normal mood and affect  Imaging: No results found.   ASSESSMENT & PLAN:  1. Impacted cerumen of left ear   2. Sensation of fullness in both ears   3. Allergic rhinitis, unspecified seasonality, unspecified trigger     Rest and drink  plenty of fluids May take zyrtec or claritin as needed May use solution of 1/2 water and 1/2 peroxide to clean ears 2-3 times per week Take medications as directed and to completion Continue to use OTC ibuprofen and/ or tylenol as needed for pain control Follow up with PCP if symptoms persists Return here or go to the ER if you have any new or worsening symptoms   Reviewed expectations re: course of current medical issues. Questions answered. Outlined signs and symptoms indicating need for more acute intervention. Patient verbalized understanding. After Visit Summary given.         Faustino Congress, NP 01/15/20 1041

## 2020-01-15 NOTE — Discharge Instructions (Signed)
I would have you take zyrtec or claritin to help with drying up some of the fluid in your head  We have cleaned out your ear today  You may use a solution of 1/2 peroxide, 1/2 water to clean your ears 2-3 times per week  Follow up with this office or with primary care as needed

## 2020-01-20 NOTE — Progress Notes (Signed)
[place]  [phone] [phone]     ID: Alexis [name] DOB: 06/10/1951  MR#: [id number]  PIR#:[id number]  Patient Care Team: Tanda Rockers, MD as PCP - General (Pulmonary Disease) Alphonsa Overall, MD as Consulting Physician (General Surgery) Keiandre Cygan, Virgie Dad, MD as Consulting Physician (Oncology) Kyung Rudd, MD as Consulting Physician (Radiation Oncology) Allyn Kenner, MD (Dermatology) Regal, Tamala Fothergill, DPM as Consulting Physician (Podiatry) OTHER MD:   CHIEF COMPLAINT: estrogen receptor positive breast cancer  CURRENT TREATMENT:  Tamoxifen   INTERVAL HISTORY: Alexis [name] returns today for follow-up of her estrogen receptor positive breast cancer.   She continues on tamoxifen. She does not have any noticeable hot flashes. She notices some vaginal discharge, but it is not noticeable most days.  Since her last visit, she underwent bone density screening on 06/18/2019 showing a T-score of -1.7, which is considered osteopenic.  She presented to her annual mammogram with focal outer right breast pain. She underwent bilateral diagnostic mammography with tomography and right breast ultrasonography at [facility] on 11/17/2019 showing: breast density category C; no abnormality within outer right breast at site of patient's focal pain; no evidence of malignancy in either breast.    REVIEW OF SYSTEMS: Brayah has been having pain in her right hip running down her right leg for about a month.  She has not brought it to medical attention because she has been hoping that it would go away on its own and of course she is terrified that this might be cancer.  She is feeling very tired and she says that is very unusual for her.  Recall she did fall in April but she does not think she injured herself at that time.  Aside from these issues she tells me her husband's brother, who also had breast cancer, has had a heart attack.  She does not think he has been genetically  tested.  A detailed review of systems today was otherwise stable   BREAST CANCER HISTORY: From the original intake note:  Alexis [name] had routine mammographic screening with tomography at [facility] 11/04/2015. This showed an area of distortion and calcifications in the right breast.  Right diagnostic mammography with tomography and right breast ultrasonography at the [facility] 11/15/2015 on the breast density to be categoryC.  In the upper-outer quadrant of the right breast there was an area of distortion associated with pleomorphic calcifications measuring approximately 1 cm. On exam there was no palpable mass.  By ultrasonography, there was an irregular hypoechoic mass superiorly measuring 1.2 cm. Ultrasound of the axilla was benign.  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  Her subsequent history is as detailed below   PAST MEDICAL HISTORY: Past Medical History:  Diagnosis Date  . Allergy   . Asthma    environmental now resolved  . Breast cancer ([place]) 11/16/2015   Right Breast  . Chronic fatigue   . Cough   . Family history of breast cancer   . Family history of lung cancer   . GERD (gastroesophageal reflux disease)   . Heart murmur    "years ago, when I was young"  . Hemangioma   . History of hiatal hernia   . Hyperlipidemia   .  IBS (irritable bowel syndrome)   . Iron deficiency anemia   . Kyphosis of thoracic region   . MVP (mitral valve prolapse)   . Osteopenia   . Personal history of radiation therapy   . SAH (subarachnoid hemorrhage) ([place])   . Vertigo     PAST SURGICAL HISTORY: Past Surgical History:  Procedure Laterality Date  . APPENDECTOMY    . BREAST  BIOPSY Right 11/16/2015   U/S Core- Malignant  . BREAST BIOPSY Left 06/21/2005   Stereo- Benign  . BREAST EXCISIONAL BIOPSY Left 2007  . BREAST LUMPECTOMY Right 12/22/2015  . BREAST LUMPECTOMY WITH RADIOACTIVE SEED AND SENTINEL LYMPH NODE BIOPSY Right 12/22/2015   Procedure: BREAST LUMPECTOMY WITH RADIOACTIVE SEED AND SENTINEL LYMPH NODE BIOPSY;  Surgeon: Alphonsa Overall, MD;  Location: [place];  Service: General;  Laterality: Right;  . COLONOSCOPY    . FOOT SURGERY Right    bunionectomy  . MOUTH SURGERY    . [facility]  2003   s/p embolization per Dr. Estanislado Pandy  . TONSILLECTOMY    . TOTAL HIP ARTHROPLASTY Left 01/11/2017   Procedure: LEFT TOTAL HIP ARTHROPLASTY ANTERIOR APPROACH;  Surgeon: [name], MD;  Location: [place];  Service: Orthopedics;  Laterality: Left;  . TUBAL LIGATION    . UPPER GASTROINTESTINAL ENDOSCOPY      FAMILY HISTORY Family History  Problem Relation Age of Onset  . Hyperlipidemia Mother   . Breast cancer Mother 58  . Heart attack Father   . Diabetes Brother   . Breast cancer Maternal Aunt        diagnosed 22s or 74s  . Breast cancer Paternal Aunt 43       diagnosed in her 26s  . Lung cancer Paternal Uncle   . Colon cancer Neg Hx   . Colon polyps Neg Hx   . Esophageal cancer Neg Hx   . Rectal cancer Neg Hx   . Stomach cancer Neg Hx   the patient's father died from a ruptured aneurysm at the age of 57. The patient's mother was diagnosed with breast cancer at the age of 74 and died at age 43. The patient has a paternal aunt diagnosed with breast cancer at age 68. The patient has 2 brothers, 2 sisters. Ne brother has had a history of aneurysms, as has the patient There is no history of ovarian cancer in the family.   GYNECOLOGIC HISTORY:  No LMP recorded. Patient is postmenopausal. Menarche age 34, first live birth age 66, the patient is [place] P2. She stopped having periods at age 75.He did not take hormone replacement. 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 Her husband had more is retired from [place] and 16. Daughter [name] lives in [place] where she works as an Medical illustrator. (Adopted) son Tobey Lippard lives in [place] where he is a Museum/gallery curator. 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 The patient has 3 grandchildren aged 24, 46, and 26. She attends a [employer]    ADVANCED DIRECTIVES: In the absence of any documentation to the contrary, the patient's spouse is their HCPOA.    HEALTH MAINTENANCE: Social History   Tobacco Use  . Smoking status: Never Smoker  . Smokeless tobacco: Never Used  Vaping Use  . Vaping Use: Never used  Substance Use Topics  . Alcohol use: No  .  Drug use: No     Colonoscopy: 2012/[facility]  PAP:2012  Bone density: 2015  Lipid panel:  Allergies  Allergen Reactions  . Other Itching    Jell-o  . Latex Rash    Contact rash    Current Outpatient Medications  Medication Sig Dispense Refill  . alendronate (FOSAMAX) 70 MG tablet Take 1 tablet (70 mg total) by mouth once a week. Take with a full glass of water on an empty stomach. 4 tablet 11  . aspirin EC 81 MG tablet Take 81 mg by mouth daily.    . B Complex-C (SUPER B COMPLEX/VITAMIN C PO) Take 1 tablet by mouth daily.    . Calcium Carbonate-Vitamin D (CALCIUM 600/VITAMIN D PO) Take 1 tablet by mouth daily.    Marland Kitchen docusate sodium (COLACE) 100 MG capsule Take 100 mg by mouth 2 (two) times daily.    . pravastatin (PRAVACHOL) 40 MG tablet TAKE 1 TABLET BY MOUTH EVERY DAY IN THE EVENING 90 tablet 3  . psyllium (REGULOID) 0.52 g capsule Take 0.52 g by mouth daily.    . tamoxifen (NOLVADEX) 20 MG tablet Take 1 tablet (20 mg total) by mouth at bedtime. 90 tablet 4   No current facility-administered medications for this visit.    OBJECTIVE: white woman who appears  stated age 20:   01/21/20 1139  BP: (!) 155/66  Pulse: (!) 59  Resp: 18  Temp: 98.5 F (36.9 C)  SpO2: 100%     Body mass index is 19.6 kg/m.    ECOG FS:1 - Symptomatic but completely ambulatory  Sclerae unicteric, EOMs intact Wearing a mask No cervical or supraclavicular adenopathy Lungs no rales or rhonchi Heart regular rate and rhythm Abd soft, nontender, positive bowel sounds MSK no focal spinal tenderness, no upper extremity lymphedema Neuro: nonfocal, well oriented, appropriate affect Breasts: She has had bilateral lumpectomies.  I do not palpate any masses of concern in either breast.  Both axillae are benign.   LAB RESULTS:  CMP     Component Value Date/Time   NA 141 06/15/2019 0912   NA 144 12/26/2016 1105   K 3.6 06/15/2019 0912   K 4.0 12/26/2016 1105   CL 104 06/15/2019 0912   CO2 31 06/15/2019 0912   CO2 29 12/26/2016 1105   GLUCOSE 96 06/15/2019 0912   GLUCOSE 92 12/26/2016 1105   GLUCOSE 106 (H) 05/24/2006 0950   BUN 19 06/15/2019 0912   BUN 12.2 12/26/2016 1105   CREATININE 0.68 06/15/2019 0912   CREATININE 0.72 02/03/2019 1157   CREATININE 0.7 12/26/2016 1105   CALCIUM 9.0 06/15/2019 0912   CALCIUM 9.7 12/26/2016 1105   PROT 6.4 06/15/2019 0912   PROT 6.7 12/26/2016 1105   ALBUMIN 4.0 06/15/2019 0912   ALBUMIN 3.7 12/26/2016 1105   AST 26 06/15/2019 0912   AST 26 02/03/2019 1157   AST 22 12/26/2016 1105   ALT 19 06/15/2019 0912   ALT 20 02/03/2019 1157   ALT 17 12/26/2016 1105   ALKPHOS 34 (L) 06/15/2019 0912   ALKPHOS 49 12/26/2016 1105   BILITOT 0.5 06/15/2019 0912   BILITOT 0.5 02/03/2019 1157   BILITOT 0.57 12/26/2016 1105   GFRNONAA >60 02/03/2019 1157   GFRAA >60 02/03/2019 1157    INo results found for: SPEP, UPEP  Lab Results  Component Value Date   WBC 5.5 01/21/2020   NEUTROABS 3.4 01/21/2020   HGB 11.2 (L) 01/21/2020   HCT 34.1 (L) 01/21/2020   MCV 89.3 01/21/2020  PLT 145 (L) 01/21/2020      Chemistry        Component Value Date/Time   NA 141 06/15/2019 0912   NA 144 12/26/2016 1105   K 3.6 06/15/2019 0912   K 4.0 12/26/2016 1105   CL 104 06/15/2019 0912   CO2 31 06/15/2019 0912   CO2 29 12/26/2016 1105   BUN 19 06/15/2019 0912   BUN 12.2 12/26/2016 1105   CREATININE 0.68 06/15/2019 0912   CREATININE 0.72 02/03/2019 1157   CREATININE 0.7 12/26/2016 1105      Component Value Date/Time   CALCIUM 9.0 06/15/2019 0912   CALCIUM 9.7 12/26/2016 1105   ALKPHOS 34 (L) 06/15/2019 0912   ALKPHOS 49 12/26/2016 1105   AST 26 06/15/2019 0912   AST 26 02/03/2019 1157   AST 22 12/26/2016 1105   ALT 19 06/15/2019 0912   ALT 20 02/03/2019 1157   ALT 17 12/26/2016 1105   BILITOT 0.5 06/15/2019 0912   BILITOT 0.5 02/03/2019 1157   BILITOT 0.57 12/26/2016 1105       No results found for: LABCA2  No components found for: LABCA125  No results for input(s): INR in the last 168 hours.  Urinalysis    Component Value Date/Time   COLORURINE STRAW (A) 01/01/2017 1630   APPEARANCEUR CLEAR 01/01/2017 1630   LABSPEC 1.013 01/01/2017 1630   PHURINE 6.0 01/01/2017 1630   GLUCOSEU NEGATIVE 01/01/2017 1630   GLUCOSEU NEGATIVE 04/21/2015 0932   HGBUR NEGATIVE 01/01/2017 1630   BILIRUBINUR NEGATIVE 01/01/2017 1630   KETONESUR NEGATIVE 01/01/2017 1630   PROTEINUR NEGATIVE 01/01/2017 1630   UROBILINOGEN 0.2 04/21/2015 0932   NITRITE NEGATIVE 01/01/2017 1630   LEUKOCYTESUR NEGATIVE 01/01/2017 1630      ELIGIBLE FOR AVAILABLE RESEARCH PROTOCOL: no  STUDIES: No results found.   ASSESSMENT: 69 y.o. [place], [place] woman status post right breast upper outer quadrant biopsy 11/16/2015 for a clinical T1b N0, stage IA invasive ductal carcinoma, grade 1, estrogen receptor positive, progesterone receptor negative, HER-2 nonamplified, with an MIB-1 of 5%.  (1) status post right lumpectomy 12/22/2015 for a pT1c pN0, stage IA  invasive ductal carcinoma, grade 1, repeat HER-2 again negative  (2) Oncotype  DX score of 21 predicts a risk of recurrence outside the breast over the next 10 years of 14% if the patient's only systemic therapy is tamoxifen for 5 years. It predicts a benefit from chemotherapy in the 3-4% range.  (3) adjuvant radiation 02/06/2016 through 03/05/2016 Site/dose:   The patient initially received a dose of 42.5 Gy in 17 fractions to the breast using whole-breast tangent fields. This was delivered using a 3-D conformal technique. The patient then received a boost to the seroma. This delivered an additional 7.5 Gy in 3 fractions using an en face electron field due to the depth of the seroma. The total dose was 50 Gy.   (4) Started tamoxifen October 2017  (a) bone density at [facility] 04/16/2014 found osteopenia with a T score of -1.6  (5) genetics testing 02/03/2019 through the [facility] Common Hereditary Cancers panel found no deleterious mutations in. APC, ATM, AXIN2, BARD1, BMPR1A, BRCA1, BRCA2, BRIP1, CDH1, CDK4, CDKN2A (p14ARF), CDKN2A (p16INK4a), CHEK2, CTNNA1, DICER1, EPCAM (Deletion/duplication testing only), GREM1 (promoter region deletion/duplication testing only), KIT, MEN1, MLH1, MSH2, MSH3, MSH6, MUTYH, NBN, NF1, NHTL1, PALB2, PDGFRA, PMS2, POLD1, POLE, PTEN, RAD50, RAD51C, RAD51D, RNF43, SDHB, SDHC, SDHD, SMAD4, SMARCA4. STK11, TP53, TSC1, TSC2, and VHL.  The following genes were evaluated for sequence changes  only: SDHA and HOXB13 c.251G>A variant only.    (6) dense breasts: Opted against yearly MRI in addition to mammography   PLAN: Alonnie is now just over 4 years out from definitive surgery for her breast cancer with no evidence of disease recurrence.  This is very favorable.  I think she is having fairly run-of-the-mill sciatica.  We are going to obtain plain films of that area today simply to show that there is no evidence of cancer.  She will then follow-up with her primary care physician regarding this.  The plan is to continue tamoxifen for a total of 5  years which will take [place] through September of next year, and that means that her next visit with me a year from now will be her "graduation visit".  She knows to call for any other issue that may develop before the next visit  Total encounter time 25 minutes.*  Chester Wenzlick, Virgie Dad, MD  01/21/20 12:01 PM Medical Oncology and Hematology [facility] [place], [place] [address] Tel. [phone]    Fax. [phone]   I, Wilburn Mylar, am acting as scribe for Dr. Virgie Dad. Xavion Muscat.  I, Lurline Del MD, have reviewed the above documentation for accuracy and completeness, and I agree with the above.    *Total Encounter Time as defined by the Centers for Medicare and Medicaid Services includes, in addition to the face-to-face time of a patient visit (documented in the note above) non-face-to-face time: obtaining and reviewing outside history, ordering and reviewing medications, tests or procedures, care coordination (communications with other health care professionals or caregivers) and documentation in the medical record.

## 2020-01-21 ENCOUNTER — Inpatient Hospital Stay: Payer: Medicare Other

## 2020-01-21 ENCOUNTER — Other Ambulatory Visit: Payer: Self-pay

## 2020-01-21 ENCOUNTER — Ambulatory Visit (HOSPITAL_COMMUNITY)
Admission: RE | Admit: 2020-01-21 | Discharge: 2020-01-21 | Disposition: A | Discharge status: Home / Self Care | Payer: Medicare Other | Source: Ambulatory Visit | Attending: Oncology | Admitting: Oncology

## 2020-01-21 ENCOUNTER — Inpatient Hospital Stay: Payer: Medicare Other | Attending: Oncology | Admitting: Oncology

## 2020-01-21 VITALS — BP 155/66 | HR 59 | Temp 98.5°F | Resp 18 | Ht 64.0 in | Wt 114.2 lb

## 2020-01-21 DIAGNOSIS — Z7982 Long term (current) use of aspirin: Secondary | ICD-10-CM | POA: Insufficient documentation

## 2020-01-21 DIAGNOSIS — Z833 Family history of diabetes mellitus: Secondary | ICD-10-CM | POA: Diagnosis not present

## 2020-01-21 DIAGNOSIS — C50411 Malignant neoplasm of upper-outer quadrant of right female breast: Secondary | ICD-10-CM | POA: Diagnosis not present

## 2020-01-21 DIAGNOSIS — Z170 Estrogen receptor positive status [ER+]: Secondary | ICD-10-CM

## 2020-01-21 DIAGNOSIS — Z8349 Family history of other endocrine, nutritional and metabolic diseases: Secondary | ICD-10-CM | POA: Insufficient documentation

## 2020-01-21 DIAGNOSIS — Z79810 Long term (current) use of selective estrogen receptor modulators (SERMs): Secondary | ICD-10-CM | POA: Diagnosis not present

## 2020-01-21 DIAGNOSIS — M1611 Unilateral primary osteoarthritis, right hip: Secondary | ICD-10-CM | POA: Diagnosis not present

## 2020-01-21 DIAGNOSIS — J45909 Unspecified asthma, uncomplicated: Secondary | ICD-10-CM | POA: Diagnosis not present

## 2020-01-21 DIAGNOSIS — Z9049 Acquired absence of other specified parts of digestive tract: Secondary | ICD-10-CM | POA: Diagnosis not present

## 2020-01-21 DIAGNOSIS — Z79899 Other long term (current) drug therapy: Secondary | ICD-10-CM | POA: Diagnosis not present

## 2020-01-21 DIAGNOSIS — M8580 Other specified disorders of bone density and structure, unspecified site: Secondary | ICD-10-CM | POA: Diagnosis not present

## 2020-01-21 DIAGNOSIS — Z8249 Family history of ischemic heart disease and other diseases of the circulatory system: Secondary | ICD-10-CM | POA: Insufficient documentation

## 2020-01-21 DIAGNOSIS — M810 Age-related osteoporosis without current pathological fracture: Secondary | ICD-10-CM

## 2020-01-21 DIAGNOSIS — Z801 Family history of malignant neoplasm of trachea, bronchus and lung: Secondary | ICD-10-CM | POA: Diagnosis not present

## 2020-01-21 DIAGNOSIS — Z803 Family history of malignant neoplasm of breast: Secondary | ICD-10-CM | POA: Insufficient documentation

## 2020-01-21 LAB — CBC WITH DIFFERENTIAL/PLATELET
Abs Immature Granulocytes: 0.03 10*3/uL (ref 0.00–0.07)
Basophils Absolute: 0 10*3/uL (ref 0.0–0.1)
Basophils Relative: 1 %
Eosinophils Absolute: 0.1 10*3/uL (ref 0.0–0.5)
Eosinophils Relative: 2 %
HCT: 34.1 % — ABNORMAL LOW (ref 36.0–46.0)
Hemoglobin: 11.2 g/dL — ABNORMAL LOW (ref 12.0–15.0)
Immature Granulocytes: 1 %
Lymphocytes Relative: 25 %
Lymphs Abs: 1.4 10*3/uL (ref 0.7–4.0)
MCH: 29.3 pg (ref 26.0–34.0)
MCHC: 32.8 g/dL (ref 30.0–36.0)
MCV: 89.3 fL (ref 80.0–100.0)
Monocytes Absolute: 0.6 10*3/uL (ref 0.1–1.0)
Monocytes Relative: 11 %
Neutro Abs: 3.4 10*3/uL (ref 1.7–7.7)
Neutrophils Relative %: 60 %
Platelets: 145 10*3/uL — ABNORMAL LOW (ref 150–400)
RBC: 3.82 MIL/uL — ABNORMAL LOW (ref 3.87–5.11)
RDW: 12.3 % (ref 11.5–15.5)
WBC: 5.5 10*3/uL (ref 4.0–10.5)
nRBC: 0 % (ref 0.0–0.2)

## 2020-01-22 ENCOUNTER — Telehealth: Payer: Self-pay

## 2020-01-22 NOTE — Telephone Encounter (Signed)
Called pt to inform of results per Wilber Bihari, NP. Pt verbalized thanks and understanding.

## 2020-01-22 NOTE — Telephone Encounter (Signed)
-----   Message from Gardenia Phlegm, NP sent at 01/22/2020  1:14 PM EDT ----- Odette Horns shows arthritis, please notify patient ----- Message ----- From: Interface, Rad Results In Sent: 01/21/2020   4:46 PM EDT To: Chauncey Cruel, MD

## 2020-02-10 DIAGNOSIS — Z01419 Encounter for gynecological examination (general) (routine) without abnormal findings: Secondary | ICD-10-CM | POA: Diagnosis not present

## 2020-03-10 ENCOUNTER — Ambulatory Visit (INDEPENDENT_AMBULATORY_CARE_PROVIDER_SITE_OTHER): Payer: Medicare Other | Admitting: Orthopaedic Surgery

## 2020-03-10 ENCOUNTER — Encounter: Payer: Self-pay | Admitting: Orthopaedic Surgery

## 2020-03-10 ENCOUNTER — Ambulatory Visit: Payer: Self-pay

## 2020-03-10 VITALS — Ht 64.0 in | Wt 112.0 lb

## 2020-03-10 DIAGNOSIS — M545 Low back pain: Secondary | ICD-10-CM | POA: Diagnosis not present

## 2020-03-10 DIAGNOSIS — G8929 Other chronic pain: Secondary | ICD-10-CM

## 2020-03-10 MED ORDER — DIAZEPAM 5 MG PO TABS
ORAL_TABLET | ORAL | 0 refills | Status: DC
Start: 2020-03-10 — End: 2021-01-19

## 2020-03-10 NOTE — Progress Notes (Signed)
Office Visit Note   Patient: Alexis [name]           Date of Birth: April 18, 1951           MRN: [id number] Visit Date: 03/10/2020              Requested by: Tanda Rockers, MD [address] [place],  [place] [address] PCP: Tanda Rockers, MD   Assessment & Plan: Visit Diagnoses:  1. Chronic bilateral low back pain, unspecified whether sciatica present     Plan: Persistent symptoms now for 5 months without improvement and gradual progression. She does have a history of breast cancer pain is in her back radiates to her right thigh stops at her knee. She does have some endplate sclerosis at J6-7 with spurring on the right side and may have some lateral recess or foraminal stenosis at that level with some disc protrusion that occurred after a fall. Will obtain an MRI scan lumbar without contrast office follow-up after scan for review.  Follow-Up Instructions: No follow-ups on file.   Orders:  Orders Placed This Encounter  Procedures  . XR Lumbar Spine 2-3 Views   No orders of the defined types were placed in this encounter.     Procedures: No procedures performed   Clinical Data: No additional findings.   Subjective: Chief Complaint  Patient presents with  . Lower Back - Pain    HPI 69-year-old female seen with back in pain radiating into her right leg down to her knee. She fell in April in the garage falling forward she had some fluid aspirated from her left knee and states few days later she is gradually had increasing back pain and right leg pain that mostly stops at the knee she denies numbness or tingling no bowel or bladder associated symptoms. Pain is worse with walking she is noticed she walks at a slower speed some difficulty sleeping. Her husband states she groans when she turns over. No numbness or tingling and no weakness in her gait. She does have to grab hold the rail when she goes up and down stairs now. Past history of breast cancer 4 years ago 2017  right breast estrogen receptor positive. History positive for hyperlipidemia, GERD, IBS. 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 Patient denies any right groin pain currently.  Review of Systems all other systems are negative as they pertain to HPI other than as mentioned above.   Objective: Vital Signs: Ht 5\' 4"  (1.626 m)   Wt 112 lb (50.8 kg)   BMI 19.22 kg/m   Physical Exam Constitutional:      Appearance: She is well-developed.  HENT:     Head: Normocephalic.     Right Ear: External ear normal.     Left Ear: External ear normal.  Eyes:     Pupils: Pupils are equal, round, and reactive to light.  Neck:     Thyroid: No thyromegaly.     Trachea: No tracheal deviation.  Cardiovascular:     Rate and Rhythm: Normal rate.  Pulmonary:     Effort: Pulmonary effort is normal.  Abdominal:     Palpations: Abdomen is soft.  Skin:    General: Skin is warm and dry.  Neurological:     Mental Status: She is alert and oriented to person, place, and time.  Psychiatric:        Behavior: Behavior normal.  Ortho Exam well-healed left anterior hip incision negative logroll right left hip. Right hip internally rotates 30 degrees without discomfort. Negative straight leg raising. No hip flexion weakness. Slight weakness of hip adductor and abductor which is symmetrical. Quads are strong normal heel toe gait reflexes are 2+ distal pulses are 2+. Mild sciatic notch tenderness no trochanteric bursa tenderness. Patient does have lumbar curvature with a left lumbar curve and compensatory right thoracic curve.  Specialty Comments:  No specialty comments available.  Imaging: No results found.   PMFS History: Patient Active Problem List   Diagnosis Date Noted  . Genetic testing 02/09/2019  . Family history of breast cancer   . Family history of lung cancer   . Cerumen impaction 06/05/2017  . Upper airway cough syndrome 04/30/2017  . Palpitations  04/30/2017  . Chronic bilateral low back pain 02/28/2017  . Presence of left artificial hip joint 01/24/2017  . Arthritis of left hip 01/11/2017  . Unilateral primary osteoarthritis, left hip 12/02/2016  . Malignant neoplasm of upper-outer quadrant of right breast in female, estrogen receptor positive ([place]) 11/17/2015  . Breast mass 11/15/2015  . Radicular pain of left lower extremity 10/28/2015  . Microcytic anemia 04/22/2015  . Health care maintenance 03/25/2013  . Arthralgia 07/27/2011  . Premature atrial contractions 06/24/2009  . DIZZINESS OR VERTIGO 05/30/2007  . HEMANGIOMA 05/02/2007  . Hyperlipidemia LDL goal <160 05/02/2007  . [place]  h/o 2003 05/02/2007  . GERD 05/02/2007  . IRRITABLE BOWEL SYNDROME 05/02/2007  . Osteoporosis 05/02/2007  . 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 FATIGUE, CHRONIC 05/02/2007  . COUGH 05/02/2007   Past Medical History:  Diagnosis Date  . Allergy   . Asthma    environmental now resolved  . Breast cancer ([facility]) 11/16/2015   Right Breast  . Chronic fatigue   . Cough   . Family history of breast cancer   . Family history of lung cancer   . GERD (gastroesophageal reflux disease)   . Heart murmur    "years ago, when I was young"  . Hemangioma   . History of hiatal hernia   . Hyperlipidemia   . IBS (irritable bowel syndrome)   . Iron deficiency anemia   . Kyphosis of thoracic region   . MVP (mitral valve prolapse)   . Osteopenia   . Personal history of radiation therapy   . SAH (subarachnoid hemorrhage) ([place])   . Vertigo     Family History  Problem Relation Age of Onset  . Hyperlipidemia Mother   . Breast cancer Mother 11  . Heart attack Father   . Diabetes Brother   . Breast cancer Maternal Aunt        diagnosed 26s or 26s  . Breast cancer Paternal Aunt 62       diagnosed in her 73s  . Lung cancer Paternal Uncle   . Colon cancer Neg Hx   . Colon polyps Neg Hx   . Esophageal cancer Neg Hx   . Rectal cancer Neg Hx   .  Stomach cancer Neg Hx     Past Surgical History:  Procedure Laterality Date  . APPENDECTOMY    . BREAST BIOPSY Right 11/16/2015   U/S Core- Malignant  . BREAST BIOPSY Left 06/21/2005   Stereo- Benign  . BREAST EXCISIONAL BIOPSY Left 2007  . BREAST LUMPECTOMY Right 12/22/2015  . BREAST LUMPECTOMY WITH RADIOACTIVE SEED AND SENTINEL LYMPH NODE BIOPSY Right 12/22/2015   Procedure: BREAST LUMPECTOMY WITH RADIOACTIVE SEED AND SENTINEL  LYMPH NODE BIOPSY;  Surgeon: Alphonsa Overall, MD;  Location: [facility];  Service: General;  Laterality: Right;  . COLONOSCOPY    . FOOT SURGERY Right    bunionectomy  . MOUTH SURGERY    . [facility]  2003   s/p embolization per Dr. Estanislado Pandy  . TONSILLECTOMY    . TOTAL HIP ARTHROPLASTY Left 01/11/2017   Procedure: LEFT TOTAL HIP ARTHROPLASTY ANTERIOR APPROACH;  Surgeon: [name], MD;  Location: [facility];  Service: Orthopedics;  Laterality: Left;  . TUBAL LIGATION    . UPPER GASTROINTESTINAL ENDOSCOPY     Social History   Occupational History  . Occupation: CSR  Tobacco Use  . Smoking status: Never Smoker  . Smokeless tobacco: Never Used  Vaping Use  . Vaping Use: Never used  Substance and Sexual Activity  . Alcohol use: No  . Drug use: No  . Sexual activity: Not on file

## 2020-04-02 ENCOUNTER — Ambulatory Visit
Admission: RE | Admit: 2020-04-02 | Discharge: 2020-04-02 | Disposition: A | Discharge status: Home / Self Care | Payer: Medicare Other | Source: Ambulatory Visit | Attending: Orthopaedic Surgery | Admitting: Orthopaedic Surgery

## 2020-04-02 ENCOUNTER — Other Ambulatory Visit: Payer: Self-pay

## 2020-04-02 DIAGNOSIS — G8929 Other chronic pain: Secondary | ICD-10-CM

## 2020-04-02 DIAGNOSIS — M545 Low back pain, unspecified: Secondary | ICD-10-CM | POA: Diagnosis not present

## 2020-04-02 DIAGNOSIS — M48061 Spinal stenosis, lumbar region without neurogenic claudication: Secondary | ICD-10-CM | POA: Diagnosis not present

## 2020-04-07 ENCOUNTER — Encounter: Payer: Self-pay | Admitting: Orthopaedic Surgery

## 2020-04-07 ENCOUNTER — Ambulatory Visit (INDEPENDENT_AMBULATORY_CARE_PROVIDER_SITE_OTHER): Payer: Medicare Other | Admitting: Orthopaedic Surgery

## 2020-04-07 ENCOUNTER — Other Ambulatory Visit: Payer: Self-pay

## 2020-04-07 VITALS — BP 133/101 | HR 80 | Ht 64.0 in | Wt 112.0 lb

## 2020-04-07 DIAGNOSIS — Z96642 Presence of left artificial hip joint: Secondary | ICD-10-CM

## 2020-04-07 NOTE — Progress Notes (Signed)
Office Visit Note   Patient: Alexis [name]           Date of Birth: 11-12-1950           MRN: [id number] Visit Date: 04/07/2020              Requested by: Tanda Rockers, MD [address] [place],  [place] [address] from pain management with stimulator needs to be right thank you PCP: Tanda Rockers, MD   Assessment & Plan: Visit Diagnoses:  1. History of total left hip arthroplasty   2.    scoliosis  Plan: MRI spine shows some narrowing on the left at L4-5 she has more pain on the right she only has mild narrowing right L2-3 but that could be consistent with her symptoms we discussed possible epidural injection if her symptoms progress.  She principally has problems with turning twisting she can see if she can talk her husband into doing the vacuuming.  She is walking much better since hip arthroplasty is happy with the results.  MRI scan shows no evidence of metastatic disease in the lumbar spine.  MRI scan was reviewed I gave her a copy report follow-up 2 months.  Follow-Up Instructions: Return in about 2 months (around 06/07/2020).   Orders:  No orders of the defined types were placed in this encounter.  No orders of the defined types were placed in this encounter.     Procedures: No procedures performed   Clinical Data: No additional findings.   Subjective: Chief Complaint  Patient presents with  . Lower Back - Follow-up, Pain    MRI lumbar review    HPI patient returns post MRI scan she has scoliosis primarily had problems with vacuuming or turning twisting activities.  Previous left total hip arthroplasty doing well.  Review of Systems updated unchanged since her hip surgery 2018.   Objective: Vital Signs: BP (!) 133/101   Pulse 80   Ht 5\' 4"  (1.626 m)   Wt 112 lb (50.8 kg)   BMI 19.22 kg/m   Physical Exam Constitutional:      Appearance: She is well-developed.  HENT:     Head: Normocephalic.     Right Ear: External ear normal.     Left  Ear: External ear normal.  Eyes:     Pupils: Pupils are equal, round, and reactive to light.  Neck:     Thyroid: No thyromegaly.     Trachea: No tracheal deviation.  Cardiovascular:     Rate and Rhythm: Normal rate.  Pulmonary:     Effort: Pulmonary effort is normal.  Abdominal:     Palpations: Abdomen is soft.  Skin:    General: Skin is warm and dry.  Neurological:     Mental Status: She is alert and oriented to person, place, and time.  Psychiatric:        Behavior: Behavior normal.     Ortho Exam negative logroll the hips reflexes are normal well-healed left anterior hip incision.  Minimal sciatic notch tenderness.  Specialty Comments:  No specialty comments available.  Imaging:CLINICAL DATA:  Low back pain.  EXAM: MRI LUMBAR SPINE WITHOUT CONTRAST  TECHNIQUE: Multiplanar, multisequence MR imaging of the lumbar spine was performed. No intravenous contrast was administered.  COMPARISON:  Plain films Nov 10, 2015.  FINDINGS: The study is degraded by motion.  Segmentation: Standard.  Alignment: Prominent levoconvex scoliosis of the lumbar spine. A 6 mm anterolisthesis of L5 over S1,  degenerative. Small retrolisthesis of L1 over L2.  Vertebrae:  No fracture, evidence of discitis, or bone lesion.  Conus medullaris and cauda equina: Conus extends to the T12-L1 level. Conus and cauda equina appear normal.  Paraspinal and other soft tissues: A 3.7 cm T2 hyperintense hepatic lesion.  Disc levels:  T12-L1: Posterior disc protrusion and facet degenerative changes without significant spinal canal or neural foraminal stenosis.  L1-2: Disc bulge and facet degenerative changes with mild right neural foraminal narrowing. No spinal canal stenosis.  L2-3: Disc bulge with associated osteophytic component and facet degenerative changes with mild right neural foraminal narrowing. No spinal canal stenosis.  L3-4: Disc bulge and facet degenerative changes  with mild right neural foraminal narrowing. No spinal canal stenosis.  L4-5: Disc bulge with associated left foraminal/extraforaminal disc protrusion and prominent facet degenerative changes resulting in effacement of the left subarticular zone and severe left neural foraminal narrowing. No spinal canal stenosis.  L5-S1: Disc uncovering and prominent hypertrophic facet degenerative changes resulting in mild left neural foraminal narrowing. No spinal canal stenosis.  IMPRESSION: 1. Multilevel degenerative changes of the lumbar spine with effacement of the left subarticular zone and severe left neural foraminal narrowing at L4-L5. 2. No significant spinal canal stenosis at any level. 3. A 3.7 cm T2 hyperintense hepatic lesion, which may represent a cyst or hemangioma.   Electronically Signed   By: Pedro Earls M.D.   On: 04/02/2020 17:47    PMFS History: Patient Active Problem List   Diagnosis Date Noted  . Genetic testing 02/09/2019  . Family history of breast cancer   . Family history of lung cancer   . Cerumen impaction 06/05/2017  . Upper airway cough syndrome 04/30/2017  . Palpitations 04/30/2017  . Chronic bilateral low back pain 02/28/2017  . History of total left hip arthroplasty 01/24/2017  . Malignant neoplasm of upper-outer quadrant of right breast in female, estrogen receptor positive ([facility]) 11/17/2015  . Breast mass 11/15/2015  . Radicular pain of left lower extremity 10/28/2015  . Microcytic anemia 04/22/2015  . Health care maintenance 03/25/2013  . Arthralgia 07/27/2011  . Premature atrial contractions 06/24/2009  . DIZZINESS OR VERTIGO 05/30/2007  . HEMANGIOMA 05/02/2007  . Hyperlipidemia LDL goal <160 05/02/2007  . [place]  h/o 2003 05/02/2007  . GERD 05/02/2007  . IRRITABLE BOWEL SYNDROME 05/02/2007  . Osteoporosis 05/02/2007  . 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 FATIGUE, CHRONIC 05/02/2007  . COUGH 05/02/2007   Past  Medical History:  Diagnosis Date  . Allergy   . Asthma    environmental now resolved  . Breast cancer ([facility]) 11/16/2015   Right Breast  . Chronic fatigue   . Cough   . Family history of breast cancer   . Family history of lung cancer   . GERD (gastroesophageal reflux disease)   . Heart murmur    "years ago, when I was young"  . Hemangioma   . History of hiatal hernia   . Hyperlipidemia   . IBS (irritable bowel syndrome)   . Iron deficiency anemia   . Kyphosis of thoracic region   . MVP (mitral valve prolapse)   . Osteopenia   . Personal history of radiation therapy   . SAH (subarachnoid hemorrhage) ([place] [facility])   . Vertigo     Family History  Problem Relation Age of Onset  . Hyperlipidemia Mother   . Breast cancer Mother 25  . Heart attack Father   . Diabetes Brother   . Breast  cancer Maternal Aunt        diagnosed 14s or 9s  . Breast cancer Paternal Aunt 2       diagnosed in her 27s  . Lung cancer Paternal Uncle   . Colon cancer Neg Hx   . Colon polyps Neg Hx   . Esophageal cancer Neg Hx   . Rectal cancer Neg Hx   . Stomach cancer Neg Hx     Past Surgical History:  Procedure Laterality Date  . APPENDECTOMY    . BREAST BIOPSY Right 11/16/2015   U/S Core- Malignant  . BREAST BIOPSY Left 06/21/2005   Stereo- Benign  . BREAST EXCISIONAL BIOPSY Left 2007  . BREAST LUMPECTOMY Right 12/22/2015  . BREAST LUMPECTOMY WITH RADIOACTIVE SEED AND SENTINEL LYMPH NODE BIOPSY Right 12/22/2015   Procedure: BREAST LUMPECTOMY WITH RADIOACTIVE SEED AND SENTINEL LYMPH NODE BIOPSY;  Surgeon: Alphonsa Overall, MD;  Location: [facility];  Service: General;  Laterality: Right;  . COLONOSCOPY    . FOOT SURGERY Right    bunionectomy  . MOUTH SURGERY    . [facility]  2003   s/p embolization per Dr. Estanislado Pandy  . TONSILLECTOMY    . TOTAL HIP ARTHROPLASTY Left 01/11/2017   Procedure: LEFT TOTAL HIP ARTHROPLASTY ANTERIOR APPROACH;  Surgeon: [name], MD;  Location: [facility];  Service:  Orthopedics;  Laterality: Left;  . TUBAL LIGATION    . UPPER GASTROINTESTINAL ENDOSCOPY     Social History   Occupational History  . Occupation: CSR  Tobacco Use  . Smoking status: Never Smoker  . Smokeless tobacco: Never Used  Vaping Use  . Vaping Use: Never used  Substance and Sexual Activity  . Alcohol use: No  . Drug use: No  . Sexual activity: Not on file

## 2020-04-08 ENCOUNTER — Other Ambulatory Visit: Payer: Self-pay | Admitting: Oncology

## 2020-05-26 ENCOUNTER — Other Ambulatory Visit: Payer: Self-pay | Admitting: Internal Medicine

## 2020-05-30 ENCOUNTER — Encounter: Payer: Self-pay | Admitting: Internal Medicine

## 2020-05-30 ENCOUNTER — Other Ambulatory Visit (HOSPITAL_COMMUNITY)
Admission: RE | Admit: 2020-05-30 | Discharge: 2020-05-30 | Disposition: A | Discharge status: Home / Self Care | Payer: Medicare Other | Source: Ambulatory Visit | Attending: Internal Medicine | Admitting: Internal Medicine

## 2020-05-30 ENCOUNTER — Ambulatory Visit (INDEPENDENT_AMBULATORY_CARE_PROVIDER_SITE_OTHER): Payer: Medicare Other | Admitting: Internal Medicine

## 2020-05-30 ENCOUNTER — Other Ambulatory Visit: Payer: Self-pay

## 2020-05-30 ENCOUNTER — Ambulatory Visit: Payer: Medicare Other | Admitting: Internal Medicine

## 2020-05-30 DIAGNOSIS — D509 Iron deficiency anemia, unspecified: Secondary | ICD-10-CM | POA: Insufficient documentation

## 2020-05-30 DIAGNOSIS — D696 Thrombocytopenia, unspecified: Secondary | ICD-10-CM

## 2020-05-30 DIAGNOSIS — M5410 Radiculopathy, site unspecified: Secondary | ICD-10-CM | POA: Diagnosis not present

## 2020-05-30 DIAGNOSIS — M818 Other osteoporosis without current pathological fracture: Secondary | ICD-10-CM

## 2020-05-30 DIAGNOSIS — R058 Other specified cough: Secondary | ICD-10-CM | POA: Diagnosis not present

## 2020-05-30 DIAGNOSIS — E785 Hyperlipidemia, unspecified: Secondary | ICD-10-CM | POA: Diagnosis not present

## 2020-05-30 LAB — CBC WITH DIFFERENTIAL/PLATELET
Abs Immature Granulocytes: 0.01 10*3/uL (ref 0.00–0.07)
Basophils Absolute: 0 10*3/uL (ref 0.0–0.1)
Basophils Relative: 1 %
Eosinophils Absolute: 0.1 10*3/uL (ref 0.0–0.5)
Eosinophils Relative: 3 %
HCT: 37.3 % (ref 36.0–46.0)
Hemoglobin: 12 g/dL (ref 12.0–15.0)
Immature Granulocytes: 0 %
Lymphocytes Relative: 26 %
Lymphs Abs: 1 10*3/uL (ref 0.7–4.0)
MCH: 29.5 pg (ref 26.0–34.0)
MCHC: 32.2 g/dL (ref 30.0–36.0)
MCV: 91.6 fL (ref 80.0–100.0)
Monocytes Absolute: 0.4 10*3/uL (ref 0.1–1.0)
Monocytes Relative: 11 %
Neutro Abs: 2.4 10*3/uL (ref 1.7–7.7)
Neutrophils Relative %: 59 %
Platelets: 117 10*3/uL — ABNORMAL LOW (ref 150–400)
RBC: 4.07 MIL/uL (ref 3.87–5.11)
RDW: 13 % (ref 11.5–15.5)
WBC: 4 10*3/uL (ref 4.0–10.5)
nRBC: 0 % (ref 0.0–0.2)

## 2020-05-30 LAB — HEPATIC FUNCTION PANEL
ALT: 22 U/L (ref 0–44)
AST: 30 U/L (ref 15–41)
Albumin: 4 g/dL (ref 3.5–5.0)
Alkaline Phosphatase: 38 U/L (ref 38–126)
Bilirubin, Direct: 0.1 mg/dL (ref 0.0–0.2)
Indirect Bilirubin: 0.7 mg/dL (ref 0.3–0.9)
Total Bilirubin: 0.8 mg/dL (ref 0.3–1.2)
Total Protein: 6.8 g/dL (ref 6.5–8.1)

## 2020-05-30 LAB — BASIC METABOLIC PANEL
Anion gap: 8 (ref 5–15)
BUN: 12 mg/dL (ref 8–23)
CO2: 28 mmol/L (ref 22–32)
Calcium: 8.8 mg/dL — ABNORMAL LOW (ref 8.9–10.3)
Chloride: 100 mmol/L (ref 98–111)
Creatinine, Ser: 0.56 mg/dL (ref 0.44–1.00)
GFR, Estimated: >60 mL/min (ref >60)
Glucose, Bld: 100 mg/dL — ABNORMAL HIGH (ref 70–99)
Potassium: 3.6 mmol/L (ref 3.5–5.1)
Sodium: 136 mmol/L (ref 135–145)

## 2020-05-30 LAB — LIPID PANEL
Cholesterol: 158 mg/dL (ref 0–200)
HDL: 48 mg/dL (ref >40)
LDL Cholesterol: 85 mg/dL (ref 0–99)
Total CHOL/HDL Ratio: 3.3 RATIO
Triglycerides: 127 mg/dL (ref <150)
VLDL: 25 mg/dL (ref 0–40)

## 2020-05-30 LAB — TSH: TSH: 1.409 u[IU]/mL (ref 0.350–4.500)

## 2020-05-30 NOTE — Assessment & Plan Note (Addendum)
Onset 06/15/2019    Lab Results  Component Value Date   PLT 117 (L) 05/30/2020   PLT 145 (L) 01/21/2020   PLT 131.0 (L) 06/15/2019   PLT 155 01/21/2019   PLT 157 12/26/2016   PLT 169 02/22/2016   PLT 183 11/23/2015    Plt ? Trending down - h/o breast ca with rt but no chemo > referred to oncology

## 2020-05-30 NOTE — Assessment & Plan Note (Signed)
Target LDL < 160 in absence of risk factors  Lab Results  Component Value Date   CHOL 158 05/30/2020   HDL 48 05/30/2020   LDLCALC 85 05/30/2020   LDLDIRECT 151.4 07/04/2010   TRIG 127 05/30/2020   CHOLHDL 3.3 05/30/2020     Adequate control on present rx, reviewed in detail with pt > no change in rx needed

## 2020-05-30 NOTE — Patient Instructions (Signed)
We will call you to arrange for shots for your osteoporosis   Please remember to go to the lab department @ [facility] for your tests - we will call you with the results when they are available.       Please schedule a follow up visit in 12 months but call sooner if needed

## 2020-05-30 NOTE — Assessment & Plan Note (Signed)
-   DEXA 07/04/06 Tspine .03, Left hip -.94, L fem Neck -1.4  - DEXA 07/12/08 Tspine -.2 Left F -.7 Right Fem -.4  - DEXA 08/15/10  T spine - 0.3, LF -0.8  RF -0.8  - DEXA 04/16/14 T score is -0.9 at the femoral neck- in the normal range Dual hip measurement is -1.6 - in the osteopenia range Spine score may be falsely elevated due to scoliosis  - DEXA req 06/18/2019   R femur -1.7 , fx risk moderate > rec fosfamax 70 mg q week  / vit d 800 / calcium 1500 mg daily  - 05/30/2020 reported worse hoarseness/ dry cough on fosamax > rec prolia shots instead

## 2020-05-30 NOTE — Assessment & Plan Note (Signed)
H/o fe def  1999, recurred early 2017 while donating blood - rx NuIron  07/29/15 >>>  Resolved  - recurrent microcytic anemia 04/30/2017  > resolved     Lab Results  Component Value Date   HGB 12.0 05/30/2020   HGB 11.2 (L) 01/21/2020   HGB 11.8 (L) 06/15/2019   HGB 12.2 01/21/2019   HGB 11.7 12/26/2016   HGB 11.7 02/22/2016   HGB 11.6 11/23/2015     Remains resolved

## 2020-05-30 NOTE — Assessment & Plan Note (Signed)
Referred to [facility] 10/28/2015 > [facility] eval 11/10/15 :  rec conservative rx/ offered surgery but declined  > f/b [facility]

## 2020-05-30 NOTE — Progress Notes (Signed)
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 Annual Exam    Pt is fasting. Pt states she has noticed some palpitations recently- last episode was approx 2 wks ago.   palpitations s presyncope  last x 2-3 min once a month  then feels drained ffor the rest of the day "same as in past" Excess mucus x one week with sense of excess pnds but no sneezing or obvious rhinorrhea and this doesn't disturb sleep rec Stop clariton and reduce caffeine use  For drainage / throat tickle try take CHLORPHENIRAMINE  4 mg -prn  Use the ear wax remover on your Left ear (over the counter)      06/06/2018  f/u ov/Cloyce Blankenhorn re:   Hyperlipidemia / chronic rhinitis with pnds/cough /mvp with h/o  palpitations Chief Complaint  Patient presents with  . Annual Exam    She is doing well and denies any co's.   Dyspnea: Not limited by breathing from desired activities   Cough: pnds/burning first thing in am  x years no better on clariton - did not try h1 as rec  Sleeping: flat bed  rec Pepcid 20 mg one hour before bedtime For drainage / throat tickle try take CHLORPHENIRAMINE  4 mg (chlortabs at [facility]) - take one every 4 hours as needed - available over the counter- may cause drowsiness so start with just a dose or two  One hour before bedtime and see how  you tolerate it before trying in daytime   GERD  Diet    11/13/2018  f/u ov/Lana Flaim re: hyperlipidemia / chronic rhinitis with pnds vs grerd  Chief Complaint  Patient presents with  . Follow-up    Doing well, has occ burning in her throat but this has been better after cutting out some caffeine.    Dyspnea:  Not limited by breathing from desired activities   Cough: am burning still present, not as often while  on h1 one H1 (never tried two as rec) , no h2  Sleeping: fine flat SABA use: none 02: none  rec Pepcid 20 mg - take x one tablet x one hour before bedtime For drainage / throat tickle try take CHLORPHENIRAMINE  4 mg (chlortabs at [facility]) - take one every 4 hours as needed  GERD diet   06/15/2019  f/u ov/Aunika Kirsten re:   Annual eval multiple problems and new R hip/back pain Chief Complaint  Patient presents with  . Annual Exam    Pt is fasting. Pt c/o right reg pain and back pain x 2-3 months.   Dyspnea:  Not limited by breathing from desired activities  / no power walking but on her feet all day Cough: gone Sleeping: flat/one pillow SABA use: none 02: none  Pain radiates back to  lateral thigh and just below the knee / no worse with coughing/ no weakness or numbness  rec Call Dr Lorin Mercy about your R back and leg pain We will call to set you up for bone density   DEXA req 06/18/2019   R femur -1.7 , fx risk moderate > rec fosfamax 70 mg q week  / vit d 800 / calcium 1500 mg daily     05/30/2020  f/u ov/[place] [facility]/Lily Velasquez re: osteo/ back and leg pain f/b yates  Chief Complaint  Patient presents with  . Follow-up    No complaints currently  Dyspnea:  No aerobics / steps are ok  Cough: ? Worse on fosfamax / assoc with hoarseness  Sleeping: flat bed/ one pillow  Got ? Memory loss p 2nd covid so won't take more shots regardless of the data     No obvious day to day or daytime variability or assoc excess/ purulent sputum or mucus plugs or hemoptysis or cp or chest  tightness, subjective wheeze or overt sinus or hb symptoms.   Sleeping  without nocturnal  or early am exacerbation  of respiratory  c/o's or need for noct saba. Also denies any obvious fluctuation of symptoms with weather or environmental changes or other aggravating or alleviating factors except as outlined above   No unusual exposure hx or h/o childhood pna/ asthma or knowledge of premature birth.  Current Allergies, Complete Past Medical History, Past Surgical History, Family History, and Social History were reviewed in [employer] record.  ROS  The following are not active complaints unless bolded Hoarseness, sore throat, dysphagia all mild since starting fosamax , dental problems, itching, sneezing,  nasal congestion or discharge of excess mucus or purulent secretions, ear ache,   fever, chills, sweats, unintended wt loss or wt gain, classically pleuritic or exertional cp,  orthopnea pnd or arm/hand swelling  or leg swelling, presyncope, palpitations, abdominal pain, anorexia, nausea, vomiting, diarrhea  or change in bowel habits or change in bladder habits, change in stools or change in urine, dysuria, hematuria,  rash, arthralgias, visual complaints, headache, numbness, weakness or ataxia or problems with walking or coordination,  change in mood or  memory.        Current Meds  Medication Sig  . alendronate (FOSAMAX) 70 MG tablet Take 1 tablet (70 mg total) by mouth once a week. Take with a full glass of water on an empty stomach.  Marland Kitchen aspirin EC 81 MG tablet Take 81 mg by mouth daily.  . B Complex-C (SUPER B COMPLEX/VITAMIN C PO) Take 1 tablet by mouth daily.  . Calcium Carbonate-Vitamin D (CALCIUM 600/VITAMIN D PO) Take 1 tablet by mouth daily.  . diazepam (VALIUM) 5 MG tablet Take as directed prior to procedure.  . docusate sodium (COLACE) 100 MG capsule Take 100 mg by mouth 2 (two) times daily.  . pravastatin (PRAVACHOL) 40 MG tablet TAKE 1 TABLET BY MOUTH EVERY  DAY IN THE EVENING  . psyllium (REGULOID) 0.52 g capsule Take 0.52 g by mouth daily.  . 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 [name] - R hip pain rad to thigh onset 02/2019  HEALTH MAINTENANCE............................................................  Mireille Lacombe  - DT 04/21/2015 - Prevnar  04/23/16  - CPX  05/30/2020  - GYN care [name] > says told no longer needed any gyn health maint as per pt at  ov 04/30/2017        Family History:  Diabetes- Brothers  Hyperlipidemia- Mother  MI/Heart Attack- Father  Breast cancer mother and father's sister     Social History:  Patient never smoked.  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    NECK :  without JVD/Nodes/TM/ nl carotid upstrokes bilaterally   LUNGS: no acc muscle use,  Nl contour chest which is clear to A and P bilaterally without cough on insp or exp maneuvers   CV:  RRR  no s3 or murmur or increase in P2, and no edema   ABD:  soft and nontender with nl inspiratory excursion in the supine position. No bruits or organomegaly appreciated, bowel sounds nl  MS:  Nl gait/ ext warm without deformities, calf tenderness, cyanosis or clubbing No obvious joint restrictions   SKIN: warm and dry without lesions    NEURO:  alert, approp, nl sensorium with  no motor or cerebellar deficits apparent.     Labs ordered/ reviewed:      Chemistry      Component Value Date/Time   NA 136 05/30/2020 1108   NA 144 12/26/2016 1105   K 3.6 05/30/2020 1108   K 4.0 12/26/2016 1105   CL 100 05/30/2020 1108   CO2 28 05/30/2020 1108   CO2 29 12/26/2016 1105   BUN 12 05/30/2020 1108   BUN 12.2 12/26/2016 1105   CREATININE 0.56 05/30/2020 1108   CREATININE 0.72 02/03/2019 1157   CREATININE 0.7 12/26/2016 1105      Component Value Date/Time   CALCIUM 8.8 (L) 05/30/2020 1108   CALCIUM 9.7 12/26/2016 1105   ALKPHOS 38 05/30/2020 1108   ALKPHOS 49 12/26/2016 1105   AST 30 05/30/2020 1108   AST 26 02/03/2019 1157   AST 22  12/26/2016 1105   ALT 22 05/30/2020 1108   ALT 20 02/03/2019 1157   ALT 17 12/26/2016 1105   BILITOT 0.8 05/30/2020 1108   BILITOT 0.5 02/03/2019 1157   BILITOT 0.57 12/26/2016 1105        Lab Results  Component Value Date   WBC 4.0 05/30/2020   HGB 12.0 05/30/2020   HCT 37.3 05/30/2020   MCV 91.6 05/30/2020   PLT 117 (L) 05/30/2020       Lab  Results  Component Value Date   TSH 1.409 05/30/2020             Assessment & Plan:

## 2020-05-30 NOTE — Assessment & Plan Note (Addendum)
Onset in her early 47s Allergy profile 04/30/2017 >  Eos 0.1 /  IgE 5 with RAST pos dog only  - 06/06/2018 rec 1st gen H1 blockers per guidelines     Worse again on fosamax likley due to gerd > will try d/c fosamax as 1st step   Each maintenance medication was reviewed in detail including emphasizing most importantly the difference between maintenance and prns and under what circumstances the prns are to be triggered using an action plan format where appropriate.  Total time for H and P, chart review, counseling,   and generating customized AVS unique to this office visit / charting = 33 min

## 2020-05-31 ENCOUNTER — Encounter: Payer: Self-pay | Admitting: *Deleted

## 2020-06-14 ENCOUNTER — Ambulatory Visit: Payer: Medicare Other | Admitting: Internal Medicine

## 2020-06-23 ENCOUNTER — Ambulatory Visit (INDEPENDENT_AMBULATORY_CARE_PROVIDER_SITE_OTHER): Payer: Medicare Other | Admitting: Orthopaedic Surgery

## 2020-06-23 ENCOUNTER — Encounter: Payer: Self-pay | Admitting: Orthopaedic Surgery

## 2020-06-23 DIAGNOSIS — M1611 Unilateral primary osteoarthritis, right hip: Secondary | ICD-10-CM

## 2020-06-23 DIAGNOSIS — M7061 Trochanteric bursitis, right hip: Secondary | ICD-10-CM | POA: Insufficient documentation

## 2020-06-23 NOTE — Progress Notes (Signed)
Office Visit Note   Patient: Alexis [name]           Date of Birth: Oct 25, 1950           MRN: [id number] Visit Date: 06/23/2020              Requested by: Tanda Rockers, MD [address],  [place] [address] PCP: Tanda Rockers, MD   Assessment & Plan: Visit Diagnoses:  1. Trochanteric bursitis, right hip   2. Unilateral primary osteoarthritis, right hip     Plan: We reviewed previous MRI scan she does have foraminal stenosis but it is on the left side where she has no symptoms.  Previous left total of arthroplasty 2018 doing well.  Right hip progressively symptomatic and we discussed options of either trochanteric injection or intra-articular hip injection.  Injection was performed which she tolerated well.  We will check her back again in 8 weeks.  If she still having pain in her groin and thigh we will obtain standing AP x-rays of both hips on a single film and a frog-leg of the right hip for comparison to 2011 August radiographs.  Follow-Up Instructions: Return in about 8 weeks (around 08/18/2020).   Orders:  Orders Placed This Encounter  Procedures  . Large Joint Inj   No orders of the defined types were placed in this encounter.     Procedures: Large Joint Inj: R greater trochanter on 06/24/2020 11:36 AM Details: lateral approach Medications: 0.5 mL lidocaine 1 %; 2 mL bupivacaine 0.25 %; 40 mg methylPREDNISolone acetate 40 MG/ML      Clinical Data: No additional findings.   Subjective: Chief Complaint  Patient presents with  . Lower Back - Follow-up, Pain    HPI 70-year-old female returns with continued problems with right groin pain lateral right hip pain that radiates down toward her thigh.  She states a lot of her pain is in the distal thigh and occurs with weightbearing on her right lower extremity.  She has noted limited range of motion of her hip with crossing her legs or with internal rotation reproducing her pain.  Previous hip x-rays in  August showed some mild osteoarthritis.  Lumbar MRI scan 04/02/2020 showed multilevel degenerative changes with severe left neuroforaminal narrowing at L4-5 on the opposite side from her hip pain.  Incidental 3.7 cm T2 hyperintense hepatic lesion which may represent a cyst or hemangioma was noted.  No additional imaging was recommended.  Patient states she gets relief when she is not weightbearing.  Previous total arthroplasty on the left hip done by me 2019 doing well without problems.  Review of Systems all other systems updated and noncontributory to HPI.   Objective: Vital Signs: Ht 5' 5.25" (1.657 m)   Wt 112 lb (50.8 kg)   BMI 18.50 kg/m   Physical Exam Constitutional:      Appearance: She is well-developed.  HENT:     Head: Normocephalic.     Right Ear: External ear normal.     Left Ear: External ear normal.  Eyes:     Pupils: Pupils are equal, round, and reactive to light.  Neck:     Thyroid: No thyromegaly.     Trachea: No tracheal deviation.  Cardiovascular:     Rate and Rhythm: Normal rate.  Pulmonary:     Effort: Pulmonary effort is normal.  Abdominal:     Palpations: Abdomen is soft.  Skin:    General: Skin is  warm and dry.  Neurological:     Mental Status: She is alert and oriented to person, place, and time.  Psychiatric:        Mood and Affect: Mood and affect normal.        Behavior: Behavior normal.     Ortho Exam patient has reproduction of her groin pain and thigh pain with internal rotation 20 degrees right hip.  No pain with logroll left hip.  Negative straight leg raising right and left negative popliteal compression test.  She has trochanteric tenderness on the right none on the left.  Specialty Comments:  No specialty comments available.  Imaging: No results found.   PMFS History: Patient Active Problem List   Diagnosis Date Noted  . Unilateral primary osteoarthritis, right hip 06/24/2020  . Trochanteric bursitis, right hip 06/23/2020  .  Thrombocytopenia (HCC) 05/30/2020  . Genetic testing 02/09/2019  . Family history of breast cancer   . Family history of lung cancer   . Cerumen impaction 06/05/2017  . Upper airway cough syndrome 04/30/2017  . Palpitations 04/30/2017  . Chronic bilateral low back pain 02/28/2017  . History of total left hip arthroplasty 01/24/2017  . Malignant neoplasm of upper-outer quadrant of right breast in female, 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  . Breast mass 11/15/2015  . Radicular pain of left lower extremity 10/28/2015  . Microcytic anemia 04/22/2015  . Health care maintenance 03/25/2013  . Arthralgia 07/27/2011  . Premature atrial contractions 06/24/2009  . DIZZINESS OR VERTIGO 05/30/2007  . HEMANGIOMA 05/02/2007  . Hyperlipidemia LDL goal <160 05/02/2007  . SAH  h/o 2003 05/02/2007  . GERD 05/02/2007  . IRRITABLE BOWEL SYNDROME 05/02/2007  . Osteoporosis 05/02/2007  . 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 FATIGUE, CHRONIC 05/02/2007  . COUGH 05/02/2007   Past Medical History:  Diagnosis Date  . Allergy   . Asthma    environmental now resolved  . Breast cancer (HCC) 11/16/2015   Right Breast  . Chronic fatigue   . Cough   . Family history of breast cancer   . Family history of lung cancer   . GERD (gastroesophageal reflux disease)   . Heart murmur    "years ago, when I was young"  . Hemangioma   . History of hiatal hernia   . Hyperlipidemia   . IBS (irritable bowel syndrome)   . Iron deficiency anemia   . Kyphosis of thoracic region   . MVP (mitral valve prolapse)   . Osteopenia   . Personal history of radiation therapy   . SAH (subarachnoid hemorrhage) (HCC)   . Vertigo     Family History  Problem Relation Age of Onset  . Hyperlipidemia Mother   . Breast cancer Mother 83  . Heart attack Father   . Diabetes Brother   . Breast cancer Maternal Aunt        diagnosed 60s or 68s  . Breast cancer Paternal Aunt 69       diagnosed in her 49s  . Lung  cancer Paternal Uncle   . Colon cancer Neg Hx   . Colon polyps Neg Hx   . Esophageal cancer Neg Hx   . Rectal cancer Neg Hx   . Stomach cancer Neg Hx     Past Surgical History:  Procedure Laterality Date  . APPENDECTOMY    . BREAST BIOPSY Right 11/16/2015   U/S Core- Malignant  . BREAST BIOPSY Left 06/21/2005   Stereo- Benign  . BREAST EXCISIONAL BIOPSY Left  2007  . BREAST LUMPECTOMY Right 12/22/2015  . BREAST LUMPECTOMY WITH RADIOACTIVE SEED AND SENTINEL LYMPH NODE BIOPSY Right 12/22/2015   Procedure: BREAST LUMPECTOMY WITH RADIOACTIVE SEED AND SENTINEL LYMPH NODE BIOPSY;  Surgeon: Alphonsa Overall, MD;  Location: [facility];  Service: General;  Laterality: Right;  . COLONOSCOPY    . FOOT SURGERY Right    bunionectomy  . MOUTH SURGERY    . [facility]  2003   s/p embolization per Dr. Estanislado Pandy  . TONSILLECTOMY    . TOTAL HIP ARTHROPLASTY Left 01/11/2017   Procedure: LEFT TOTAL HIP ARTHROPLASTY ANTERIOR APPROACH;  Surgeon: [name], MD;  Location: [facility];  Service: Orthopedics;  Laterality: Left;  . TUBAL LIGATION    . UPPER GASTROINTESTINAL ENDOSCOPY     Social History   Occupational History  . Occupation: CSR  Tobacco Use  . Smoking status: Never Smoker  . Smokeless tobacco: Never Used  Vaping Use  . Vaping Use: Never used  Substance and Sexual Activity  . Alcohol use: No  . Drug use: No  . Sexual activity: Not on file

## 2020-06-24 DIAGNOSIS — M7061 Trochanteric bursitis, right hip: Secondary | ICD-10-CM

## 2020-06-24 DIAGNOSIS — M1611 Unilateral primary osteoarthritis, right hip: Secondary | ICD-10-CM | POA: Insufficient documentation

## 2020-06-24 MED ORDER — BUPIVACAINE HCL 0.25 % IJ SOLN
2.0000 mL | INTRAMUSCULAR | Status: AC | PRN
  Administered 2020-06-24: 2 mL via INTRA_ARTICULAR

## 2020-06-24 MED ORDER — METHYLPREDNISOLONE ACETATE 40 MG/ML IJ SUSP
40.0000 mg | INTRAMUSCULAR | Status: AC | PRN
  Administered 2020-06-24: 40 mg via INTRA_ARTICULAR

## 2020-06-24 MED ORDER — LIDOCAINE HCL 1 % IJ SOLN
0.5000 mL | INTRAMUSCULAR | Status: AC | PRN
  Administered 2020-06-24: .5 mL

## 2020-07-21 ENCOUNTER — Ambulatory Visit (INDEPENDENT_AMBULATORY_CARE_PROVIDER_SITE_OTHER): Payer: Medicare Other

## 2020-07-21 ENCOUNTER — Ambulatory Visit (INDEPENDENT_AMBULATORY_CARE_PROVIDER_SITE_OTHER): Payer: Medicare Other | Admitting: Orthopaedic Surgery

## 2020-07-21 ENCOUNTER — Encounter: Payer: Self-pay | Admitting: Orthopaedic Surgery

## 2020-07-21 ENCOUNTER — Other Ambulatory Visit: Payer: Self-pay

## 2020-07-21 VITALS — Ht 65.25 in | Wt 112.0 lb

## 2020-07-21 DIAGNOSIS — M1611 Unilateral primary osteoarthritis, right hip: Secondary | ICD-10-CM

## 2020-07-21 DIAGNOSIS — M25551 Pain in right hip: Secondary | ICD-10-CM | POA: Diagnosis not present

## 2020-07-21 NOTE — Progress Notes (Signed)
Office Visit Note   Patient: Alexis [name]           Date of Birth: 1951-01-18           MRN: [id number] Visit Date: 07/21/2020              Requested by: Tanda Rockers, MD [place],  [place] [address] PCP: Tanda Rockers, MD   Assessment & Plan: Visit Diagnoses:  1. Pain in right hip   2. Unilateral primary osteoarthritis, right hip     Plan: Patient x-rays show progressing of right hip osteoarthritis.  Symptoms are not severe enough to consider total hip arthroplasty in her opinion.  We will recheck her in 3 months.  She develops severe pain she will let us know.  No repeat x-ray needed on return.  Follow-Up Instructions: Return in about 3 months (around 10/18/2020).   Orders:  Orders Placed This Encounter  Procedures  . XR HIP UNILAT W OR W/O PELVIS 2-3 VIEWS RIGHT   No orders of the defined types were placed in this encounter.     Procedures: No procedures performed   Clinical Data: No additional findings.   Subjective: Chief Complaint  Patient presents with  . Right Hip - Pain    HPI 70 year old female returns post trochanteric injection with no improvement in her symptoms from the 06/24/2018 2 injection.  Repeat x-rays taken today shows comparison from August 2021 not complete loss of joint space with subchondral sclerosis consistent with progressive osteoarthritis.  She is amatory with a slight limp sometimes the pain tends to come and go she still been able to sleep and goes wherever she like to go.  At times it does limit some of her activity.  Review of Systems previous left total hip arthroplasty 2018 doing well,   history of breast cancer 2017, right.  Hyperlipidemia GERD all of systems noncontributory to HPI.   Objective: Vital Signs: Ht 5' 5.25" (1.657 m)   Wt 112 lb (50.8 kg)   BMI 18.50 kg/m   Physical Exam Constitutional:      Appearance: She is well-developed.  HENT:     Head: Normocephalic.     Right Ear: External  ear normal.     Left Ear: External ear normal.  Eyes:     Pupils: Pupils are equal, round, and reactive to light.  Neck:     Thyroid: No thyromegaly.     Trachea: No tracheal deviation.  Cardiovascular:     Rate and Rhythm: Normal rate.  Pulmonary:     Effort: Pulmonary effort is normal.  Abdominal:     Palpations: Abdomen is soft.  Skin:    General: Skin is warm and dry.  Neurological:     Mental Status: She is alert and oriented to person, place, and time.  Psychiatric:        Mood and Affect: Mood and affect normal.        Behavior: Behavior normal.     Ortho Exam pain with 30 degrees internal rotation right hip that radiates from the groin down to just above the knee.  Pain with hip flexion and figure 4 maneuver.  No pain on the left hip range of motion.  Specialty Comments:  No specialty comments available.  Imaging: XR HIP UNILAT W OR W/O PELVIS 2-3 VIEWS RIGHT  Result Date: 07/21/2020 AP pelvis AP right hip and frog-leg right hip x-rays obtained and reviewed.  Comparison to 02/10/2020  images.  This showed progression of right hip osteoarthritis now with 1 mm joint space with subchondral sclerosis.  Left total hip arthroplasty shows good position. Impression: Progressive right hip osteoarthritis now with loss of joint space and sclerosis in the weightbearing area.    PMFS History: Patient Active Problem List   Diagnosis Date Noted  . Unilateral primary osteoarthritis, right hip 06/24/2020  . Trochanteric bursitis, right hip 06/23/2020  . Thrombocytopenia (HCC) 05/30/2020  . Genetic testing 02/09/2019  . Family history of breast cancer   . Family history of lung cancer   . Cerumen impaction 06/05/2017  . Upper airway cough syndrome 04/30/2017  . Palpitations 04/30/2017  . Chronic bilateral low back pain 02/28/2017  . History of total left hip arthroplasty 01/24/2017  . Malignant neoplasm of upper-outer quadrant of right breast in female, estrogen receptor positive  (HCC) 11/17/2015  . Breast mass 11/15/2015  . Radicular pain of left lower extremity 10/28/2015  . Microcytic anemia 04/22/2015  . Health care maintenance 03/25/2013  . Arthralgia 07/27/2011  . Premature atrial contractions 06/24/2009  . DIZZINESS OR VERTIGO 05/30/2007  . HEMANGIOMA 05/02/2007  . Hyperlipidemia LDL goal <160 05/02/2007  . SAH  h/o 2003 05/02/2007  . GERD 05/02/2007  . IRRITABLE BOWEL SYNDROME 05/02/2007  . Osteoporosis 05/02/2007  . 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 FATIGUE, CHRONIC 05/02/2007  . COUGH 05/02/2007   Past Medical History:  Diagnosis Date  . Allergy   . Asthma    environmental now resolved  . Breast cancer (HCC) 11/16/2015   Right Breast  . Chronic fatigue   . Cough   . Family history of breast cancer   . Family history of lung cancer   . GERD (gastroesophageal reflux disease)   . Heart murmur    "years ago, when I was young"  . Hemangioma   . History of hiatal hernia   . Hyperlipidemia   . IBS (irritable bowel syndrome)   . Iron deficiency anemia   . Kyphosis of thoracic region   . MVP (mitral valve prolapse)   . Osteopenia   . Personal history of radiation therapy   . SAH (subarachnoid hemorrhage) (HCC)   . Vertigo     Family History  Problem Relation Age of Onset  . Hyperlipidemia Mother   . Breast cancer Mother 36  . Heart attack Father   . Diabetes Brother   . Breast cancer Maternal Aunt        diagnosed 51s or 47s  . Breast cancer Paternal Aunt 39       diagnosed in her 20s  . Lung cancer Paternal Uncle   . Colon cancer Neg Hx   . Colon polyps Neg Hx   . Esophageal cancer Neg Hx   . Rectal cancer Neg Hx   . Stomach cancer Neg Hx     Past Surgical History:  Procedure Laterality Date  . APPENDECTOMY    . BREAST BIOPSY Right 11/16/2015   U/S Core- Malignant  . BREAST BIOPSY Left 06/21/2005   Stereo- Benign  . BREAST EXCISIONAL BIOPSY Left 2007  . BREAST LUMPECTOMY Right 12/22/2015  . BREAST LUMPECTOMY  WITH RADIOACTIVE SEED AND SENTINEL LYMPH NODE BIOPSY Right 12/22/2015   Procedure: BREAST LUMPECTOMY WITH RADIOACTIVE SEED AND SENTINEL LYMPH NODE BIOPSY;  Surgeon: Ovidio Kin, MD;  Location: [facility];  Service: General;  Laterality: Right;  . COLONOSCOPY    . FOOT SURGERY Right    bunionectomy  . MOUTH SURGERY    .  [facility]  2003   s/p embolization per Dr. Estanislado Pandy  . TONSILLECTOMY    . TOTAL HIP ARTHROPLASTY Left 01/11/2017   Procedure: LEFT TOTAL HIP ARTHROPLASTY ANTERIOR APPROACH;  Surgeon: [name], MD;  Location: [facility];  Service: Orthopedics;  Laterality: Left;  . TUBAL LIGATION    . UPPER GASTROINTESTINAL ENDOSCOPY     Social History   Occupational History  . Occupation: CSR  Tobacco Use  . Smoking status: Never Smoker  . Smokeless tobacco: Never Used  Vaping Use  . Vaping Use: Never used  Substance and Sexual Activity  . Alcohol use: No  . Drug use: No  . Sexual activity: Not on file

## 2020-08-12 ENCOUNTER — Other Ambulatory Visit: Payer: Self-pay | Admitting: Internal Medicine

## 2020-08-15 DIAGNOSIS — N840 Polyp of corpus uteri: Secondary | ICD-10-CM | POA: Diagnosis not present

## 2020-10-13 ENCOUNTER — Other Ambulatory Visit: Payer: Self-pay | Admitting: Oncology

## 2020-10-13 DIAGNOSIS — Z853 Personal history of malignant neoplasm of breast: Secondary | ICD-10-CM

## 2020-11-15 ENCOUNTER — Ambulatory Visit
Admission: EM | Admit: 2020-11-15 | Discharge: 2020-11-15 | Disposition: A | Discharge status: Home / Self Care | Payer: Medicare Other | Attending: Family Medicine | Admitting: Family Medicine

## 2020-11-15 ENCOUNTER — Encounter: Payer: Self-pay | Admitting: Emergency Medicine

## 2020-11-15 DIAGNOSIS — R350 Frequency of micturition: Secondary | ICD-10-CM | POA: Diagnosis not present

## 2020-11-15 DIAGNOSIS — N3000 Acute cystitis without hematuria: Secondary | ICD-10-CM | POA: Diagnosis not present

## 2020-11-15 LAB — POCT URINALYSIS DIP (MANUAL ENTRY)
Bilirubin, UA: NEGATIVE
Glucose, UA: NEGATIVE mg/dL
Ketones, POC UA: NEGATIVE mg/dL
Nitrite, UA: POSITIVE — AB
Protein Ur, POC: NEGATIVE mg/dL
Spec Grav, UA: 1.025 (ref 1.010–1.025)
Urobilinogen, UA: 0.2 E.U./dL
pH, UA: 5.5 (ref 5.0–8.0)

## 2020-11-15 MED ORDER — CIPROFLOXACIN HCL 500 MG PO TABS: 500.0000 mg | ORAL_TABLET | Freq: Two times a day (BID) | ORAL | 0 refills | Status: DC

## 2020-11-15 NOTE — ED Triage Notes (Signed)
urinary frequancy x2 weeks

## 2020-11-15 NOTE — Discharge Instructions (Addendum)
I have sent in Cipro for you to take twice a day for a week.  You may have a urinary tract infection.   We are going to culture your urine and will call you as soon as we have the results.   Drink plenty of water, 8-10 glasses per day.   You may take AZO over the counter for painful urination.  Follow up with your primary care provider as needed.   Go to the Emergency Department if you experience severe pain, shortness of breath, high fever, or other concerns.

## 2020-11-15 NOTE — ED Provider Notes (Signed)
[facility]   CC: UTI  SUBJECTIVE:  Alexis [name] is a 70 y.o. female who complains of urinary frequency, urgency for the past 2 weeks. Patient denies a precipitating event, recent sexual encounter, excessive caffeine intake. Denies urinary pain. Pain is intermittent and describes it as sharp/burning. Has not tried OTC medications without relief.  Symptoms are made worse with urination.  Denies similar symptoms in the past. Denies fever, chills, nausea, vomiting, abdominal pain, flank pain, abnormal vaginal discharge or bleeding, hematuria.    LMP: No LMP recorded. Patient is postmenopausal.  ROS: As in HPI.  All other pertinent ROS negative.     Past Medical History:  Diagnosis Date  . Allergy   . Asthma    environmental now resolved  . Breast cancer ([place]) 11/16/2015   Right Breast  . Chronic fatigue   . Cough   . Family history of breast cancer   . Family history of lung cancer   . GERD (gastroesophageal reflux disease)   . Heart murmur    "years ago, when I was young"  . Hemangioma   . History of hiatal hernia   . Hyperlipidemia   . IBS (irritable bowel syndrome)   . Iron deficiency anemia   . Kyphosis of thoracic region   . MVP (mitral valve prolapse)   . Osteopenia   . Personal history of radiation therapy   . SAH (subarachnoid hemorrhage) ([place])   . Vertigo    Past Surgical History:  Procedure Laterality Date  . APPENDECTOMY    . BREAST BIOPSY Right 11/16/2015   U/S Core- Malignant  . BREAST BIOPSY Left 06/21/2005   Stereo- Benign  . BREAST EXCISIONAL BIOPSY Left 2007  . BREAST LUMPECTOMY Right 12/22/2015  . BREAST LUMPECTOMY WITH RADIOACTIVE SEED AND SENTINEL LYMPH NODE BIOPSY Right 12/22/2015   Procedure: BREAST LUMPECTOMY WITH RADIOACTIVE SEED AND SENTINEL LYMPH NODE BIOPSY;  Surgeon: Alphonsa Overall, MD;  Location: [facility];  Service: General;  Laterality: Right;  . COLONOSCOPY    . FOOT SURGERY Right    bunionectomy  . MOUTH  SURGERY    . [facility]  2003   s/p embolization per Dr. Estanislado Pandy  . TONSILLECTOMY    . TOTAL HIP ARTHROPLASTY Left 01/11/2017   Procedure: LEFT TOTAL HIP ARTHROPLASTY ANTERIOR APPROACH;  Surgeon: [name], MD;  Location: [facility];  Service: Orthopedics;  Laterality: Left;  . TUBAL LIGATION    . UPPER GASTROINTESTINAL ENDOSCOPY     Allergies  Allergen Reactions  . Other Itching    Jell-o  . Latex Rash    Contact rash   No current facility-administered medications on file prior to encounter.   Current Outpatient Medications on File Prior to Encounter  Medication Sig Dispense Refill  . alendronate (FOSAMAX) 70 MG tablet Take 1 tablet (70 mg total) by mouth once a week. Take with a full glass of water on an empty stomach. 4 tablet 11  . aspirin EC 81 MG tablet Take 81 mg by mouth daily.    . B Complex-C (SUPER B COMPLEX/VITAMIN C PO) Take 1 tablet by mouth daily.    . Calcium Carbonate-Vitamin D (CALCIUM 600/VITAMIN D PO) Take 1 tablet by mouth daily.    . diazepam (VALIUM) 5 MG tablet Take as directed prior to procedure. (Patient not taking: No sig reported) 3 tablet 0  . docusate sodium (COLACE) 100 MG capsule Take 100 mg by mouth 2 (two) times daily.    . pravastatin (  PRAVACHOL) 40 MG tablet TAKE 1 TABLET BY MOUTH EVERY DAY IN THE EVENING 90 tablet 3  . psyllium (REGULOID) 0.52 g capsule Take 0.52 g by mouth daily.    . tamoxifen (NOLVADEX) 20 MG tablet TAKE 1 TABLET BY MOUTH EVERYDAY AT BEDTIME 90 tablet 4   Social History   Socioeconomic History  . Marital status: Married    Spouse name: Not on file  . Number of children: Not on file  . Years of education: Not on file  . Highest education level: Not on file  Occupational History  . Occupation: CSR  Tobacco Use  . Smoking status: Never Smoker  . Smokeless tobacco: Never Used  Vaping Use  . Vaping Use: Never used  Substance and Sexual Activity  . Alcohol use: No  . Drug use: No  . Sexual activity: Not on file  Other Topics  Concern  . Not on file  Social History Narrative  . Not on file   Social Determinants of Health   Financial Resource Strain: Not on file  Food Insecurity: Not on file  Transportation Needs: Not on file  Physical Activity: Not on file  Stress: Not on file  Social Connections: Not on file  Intimate Partner Violence: Not on file   Family History  Problem Relation Age of Onset  . Hyperlipidemia Mother   . Breast cancer Mother 2  . Heart attack Father   . Diabetes Brother   . Breast cancer Maternal Aunt        diagnosed 104s or 78s  . Breast cancer Paternal Aunt 73       diagnosed in her 74s  . Lung cancer Paternal Uncle   . Colon cancer Neg Hx   . Colon polyps Neg Hx   . Esophageal cancer Neg Hx   . Rectal cancer Neg Hx   . Stomach cancer Neg Hx     OBJECTIVE:  Vitals:   11/15/20 1026  BP: (!) 171/79  Pulse: 71  Resp: 18  Temp: 98 F (36.7 C)  TempSrc: Oral  SpO2: 96%   General appearance: AOx3 in no acute distress HEENT: NCAT, oropharynx clear.  Lungs: clear to auscultation bilaterally without adventitious breath sounds Heart: regular rate and rhythm. Radial pulses 2+ symmetrical bilaterally Abdomen: soft; non-distended; no tenderness; bowel sounds present; no guarding or rebound tenderness Back: no CVA tenderness Extremities: no edema; symmetrical with no gross deformities Skin: warm and dry Neurologic: Ambulates from chair to exam table without difficulty Psychological: alert and cooperative; normal mood and affect  Labs Reviewed  POCT URINALYSIS DIP (MANUAL ENTRY) - Abnormal; Notable for the following components:      Result Value   Clarity, UA cloudy (*)    Blood, UA trace-intact (*)    Nitrite, UA Positive (*)    Leukocytes, UA Large (3+) (*)    All other components within normal limits  URINE CULTURE    ASSESSMENT & PLAN:  1. Acute cystitis without hematuria   2. Frequency of urination     Meds ordered this encounter  Medications  .  ciprofloxacin (CIPRO) 500 MG tablet    Sig: Take 1 tablet (500 mg total) by mouth 2 (two) times daily.    Dispense:  14 tablet    Refill:  0    Order Specific Question:   Supervising Provider    Answer:   Chase Picket [id number]   Prescribe Cipro 5 mg twice daily x7 days Urine culture sent  We will  call you with abnormal results that need further treatment Push fluids and get plenty of rest Take antibiotic as directed and to completion Take pyridium as prescribed and as needed for symptomatic relief Follow up with PCP if symptoms persists Return here or go to ER if you have any new or worsening symptoms such as fever, worsening abdominal pain, nausea/vomiting, flank pain  Outlined signs and symptoms indicating need for more acute intervention Patient verbalized understanding After Visit Summary given     Faustino Congress, NP 11/15/20 1051

## 2020-11-17 DIAGNOSIS — L821 Other seborrheic keratosis: Secondary | ICD-10-CM | POA: Diagnosis not present

## 2020-11-18 LAB — URINE CULTURE: Culture: >=100000 — AB

## 2020-11-21 ENCOUNTER — Other Ambulatory Visit: Payer: Self-pay

## 2020-11-21 ENCOUNTER — Ambulatory Visit
Admission: RE | Admit: 2020-11-21 | Discharge: 2020-11-21 | Disposition: A | Discharge status: Home / Self Care | Payer: Medicare Other | Source: Ambulatory Visit | Attending: Oncology | Admitting: Oncology

## 2020-11-21 DIAGNOSIS — Z853 Personal history of malignant neoplasm of breast: Secondary | ICD-10-CM

## 2020-11-21 DIAGNOSIS — R922 Inconclusive mammogram: Secondary | ICD-10-CM | POA: Diagnosis not present

## 2020-12-08 ENCOUNTER — Ambulatory Visit (INDEPENDENT_AMBULATORY_CARE_PROVIDER_SITE_OTHER): Payer: Medicare Other | Admitting: Orthopaedic Surgery

## 2020-12-08 ENCOUNTER — Encounter: Payer: Self-pay | Admitting: Orthopaedic Surgery

## 2020-12-08 ENCOUNTER — Other Ambulatory Visit: Payer: Self-pay

## 2020-12-08 VITALS — Ht 65.25 in | Wt 110.0 lb

## 2020-12-08 DIAGNOSIS — M1611 Unilateral primary osteoarthritis, right hip: Secondary | ICD-10-CM

## 2020-12-08 DIAGNOSIS — Z96642 Presence of left artificial hip joint: Secondary | ICD-10-CM

## 2020-12-08 NOTE — Progress Notes (Signed)
Office Visit Note   Patient: Alexis [name]           Date of Birth: 04/24/1951           MRN: [id number] Visit Date: 12/08/2020              Requested by: Tanda Rockers, MD [place],  [place] [id number] PCP: Tanda Rockers, MD   Assessment & Plan: Visit Diagnoses:  1. Unilateral primary osteoarthritis, right hip   2. History of total left hip arthroplasty     Plan: Patient has progressive right hip osteoarthritis.  Last x-rays were done 4 months ago but she only had 1 mm joint space with subchondral sclerosis, bone spurs.  She states she is ready to proceed with right total of arthroplasty.  We discussed spinal anesthesia, overnight stay.  With her previous left hip arthroplasty she had some problems with orthostatic changes and stayed 2 nights.  Procedure discussed risk surgery discussed questions elicited and answered she understands to proceed.  Follow-Up Instructions: No follow-ups on file.   Orders:  No orders of the defined types were placed in this encounter.  No orders of the defined types were placed in this encounter.     Procedures: No procedures performed   Clinical Data: No additional findings.   Subjective: Chief Complaint  Patient presents with   Right Hip - Pain    HPI 70 year old female returns post left total of arthroplasty 5 years ago now with progressive right hip osteoarthritis with significant limp right groin pain that radiates down to her knees with occasional locking in her hip that stops her.  She states she is ready to proceed with surgery and states currently she cannot go up the steps in her house without calling her husband to help hang onto her so she does not fall.  Previous left hip arthroplasty doing well.  Patient's had breast cancer 2017 no recurrence.  History of IBS hyperlipidemia, scoliosis.  Review of Systems previous [facility] 2003 without residual.  Total hip arthroplasty left 2018 doing well asymptomatic.   All other systems noncontributory.   Objective: Vital Signs: Ht 5' 5.25" (1.657 m)   Wt 110 lb (49.9 kg)   BMI 18.16 kg/m   Physical Exam Constitutional:      Appearance: She is well-developed.  HENT:     Head: Normocephalic.     Right Ear: External ear normal.     Left Ear: External ear normal. There is no impacted cerumen.  Eyes:     Pupils: Pupils are equal, round, and reactive to light.  Neck:     Thyroid: No thyromegaly.     Trachea: No tracheal deviation.  Cardiovascular:     Rate and Rhythm: Normal rate.  Pulmonary:     Effort: Pulmonary effort is normal.  Abdominal:     Palpations: Abdomen is soft.  Musculoskeletal:     Cervical back: No rigidity.  Skin:    General: Skin is warm and dry.  Neurological:     Mental Status: She is alert and oriented to person, place, and time.  Psychiatric:        Behavior: Behavior normal.    Ortho Exam patient 10 degrees internal rotation right hip with severe pain.  External rotation 45 degrees with reproduction of hip pain.  Negative straight leg raising 90 degrees knee and ankle jerk are intact.  Well-healed left anterior hip incision.  Pedal pulses are normal.  Negative  popliteal compression test sensation the foot is normal.  Specialty Comments:  No specialty comments available.  Imaging: X-rays pelvis and hips 07/21/2020 showed subchondral sclerosis right hip with less than 1 mm joint space.  Marginal osteophytes.  Left total hip arthroplasty good position.   PMFS History: Patient Active Problem List   Diagnosis Date Noted   Unilateral primary osteoarthritis, right hip 06/24/2020   Trochanteric bursitis, right hip 06/23/2020   Thrombocytopenia ([place]) 05/30/2020   Genetic testing 02/09/2019   Family history of breast cancer    Family history of lung cancer    Cerumen impaction 06/05/2017   Upper airway cough syndrome 04/30/2017   Palpitations 04/30/2017   Chronic bilateral low back pain 02/28/2017   History of total  left hip arthroplasty 01/24/2017   Malignant neoplasm of upper-outer quadrant of right breast in female, estrogen receptor positive ([place]) 11/17/2015   Breast mass 11/15/2015   Radicular pain of left lower extremity 10/28/2015   Microcytic anemia 04/22/2015   Health care maintenance 03/25/2013   Arthralgia 07/27/2011   Premature atrial contractions 06/24/2009   DIZZINESS OR VERTIGO 05/30/2007   HEMANGIOMA 05/02/2007   Hyperlipidemia LDL goal <160 05/02/2007   SAH  h/o 2003 05/02/2007   GERD 05/02/2007   IRRITABLE BOWEL SYNDROME 05/02/2007   Osteoporosis 05/02/2007   KYPHOSCOLIOSIS, 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   FATIGUE, CHRONIC 05/02/2007   COUGH 05/02/2007   Past Medical History:  Diagnosis Date   Allergy    Asthma    environmental now resolved   Breast cancer ([place]) 11/16/2015   Right Breast   Chronic fatigue    Cough    Family history of breast cancer    Family history of lung cancer    GERD (gastroesophageal reflux disease)    Heart murmur    "years ago, when I was young"   Hemangioma    History of hiatal hernia    Hyperlipidemia    IBS (irritable bowel syndrome)    Iron deficiency anemia    Kyphosis of thoracic region    MVP (mitral valve prolapse)    Osteopenia    Personal history of radiation therapy    SAH (subarachnoid hemorrhage) ([place])    Vertigo     Family History  Problem Relation Age of Onset   Hyperlipidemia Mother    Breast cancer Mother 36   Heart attack Father    Diabetes Brother    Breast cancer Maternal Aunt        diagnosed 40s or 22s   Breast cancer Paternal Aunt 65       diagnosed in her 34s   Lung cancer Paternal Uncle    Colon cancer Neg Hx    Colon polyps Neg Hx    Esophageal cancer Neg Hx    Rectal cancer Neg Hx    Stomach cancer Neg Hx     Past Surgical History:  Procedure Laterality Date   APPENDECTOMY     BREAST BIOPSY Right 11/16/2015   U/S Core- Malignant   BREAST BIOPSY Left 06/21/2005   Stereo- Benign   BREAST  EXCISIONAL BIOPSY Left 2007   BREAST LUMPECTOMY Right 12/22/2015   BREAST LUMPECTOMY WITH RADIOACTIVE SEED AND SENTINEL LYMPH NODE BIOPSY Right 12/22/2015   Procedure: BREAST LUMPECTOMY WITH RADIOACTIVE SEED AND SENTINEL LYMPH NODE BIOPSY;  Surgeon: Alphonsa Overall, MD;  Location: [facility];  Service: General;  Laterality: Right;   COLONOSCOPY     FOOT SURGERY Right    bunionectomy   MOUTH SURGERY  [facility]  2003   s/p embolization per Dr. Estanislado Pandy   TONSILLECTOMY     TOTAL HIP ARTHROPLASTY Left 01/11/2017   Procedure: LEFT TOTAL HIP ARTHROPLASTY ANTERIOR APPROACH;  Surgeon: [name], MD;  Location: [facility];  Service: Orthopedics;  Laterality: Left;   TUBAL LIGATION     UPPER GASTROINTESTINAL ENDOSCOPY     Social History   Occupational History   Occupation: CSR  Tobacco Use   Smoking status: Never   Smokeless tobacco: Never  Vaping Use   Vaping Use: Never used  Substance and Sexual Activity   Alcohol use: No   Drug use: No   Sexual activity: Not on file

## 2020-12-23 ENCOUNTER — Other Ambulatory Visit: Payer: Self-pay

## 2021-01-16 DIAGNOSIS — N840 Polyp of corpus uteri: Secondary | ICD-10-CM | POA: Diagnosis not present

## 2021-01-16 DIAGNOSIS — N852 Hypertrophy of uterus: Secondary | ICD-10-CM | POA: Diagnosis not present

## 2021-01-19 ENCOUNTER — Other Ambulatory Visit: Payer: Self-pay

## 2021-01-19 ENCOUNTER — Inpatient Hospital Stay: Payer: Medicare Other | Attending: Oncology | Admitting: Oncology

## 2021-01-19 ENCOUNTER — Inpatient Hospital Stay: Payer: Medicare Other

## 2021-01-19 VITALS — BP 164/67 | HR 64 | Temp 98.1°F | Resp 16 | Ht 65.25 in | Wt 108.0 lb

## 2021-01-19 DIAGNOSIS — Z803 Family history of malignant neoplasm of breast: Secondary | ICD-10-CM | POA: Insufficient documentation

## 2021-01-19 DIAGNOSIS — Z79899 Other long term (current) drug therapy: Secondary | ICD-10-CM | POA: Diagnosis not present

## 2021-01-19 DIAGNOSIS — M8580 Other specified disorders of bone density and structure, unspecified site: Secondary | ICD-10-CM | POA: Insufficient documentation

## 2021-01-19 DIAGNOSIS — Z170 Estrogen receptor positive status [ER+]: Secondary | ICD-10-CM | POA: Diagnosis not present

## 2021-01-19 DIAGNOSIS — C50411 Malignant neoplasm of upper-outer quadrant of right female breast: Secondary | ICD-10-CM | POA: Insufficient documentation

## 2021-01-19 DIAGNOSIS — Z79810 Long term (current) use of selective estrogen receptor modulators (SERMs): Secondary | ICD-10-CM | POA: Insufficient documentation

## 2021-01-19 DIAGNOSIS — Z801 Family history of malignant neoplasm of trachea, bronchus and lung: Secondary | ICD-10-CM | POA: Diagnosis not present

## 2021-01-19 DIAGNOSIS — M810 Age-related osteoporosis without current pathological fracture: Secondary | ICD-10-CM

## 2021-01-19 DIAGNOSIS — Z923 Personal history of irradiation: Secondary | ICD-10-CM | POA: Diagnosis not present

## 2021-01-19 DIAGNOSIS — Z7982 Long term (current) use of aspirin: Secondary | ICD-10-CM | POA: Insufficient documentation

## 2021-01-19 LAB — CBC WITH DIFFERENTIAL/PLATELET
Abs Immature Granulocytes: 0.02 10*3/uL (ref 0.00–0.07)
Basophils Absolute: 0 10*3/uL (ref 0.0–0.1)
Basophils Relative: 0 %
Eosinophils Absolute: 0.1 10*3/uL (ref 0.0–0.5)
Eosinophils Relative: 2 %
HCT: 34 % — ABNORMAL LOW (ref 36.0–46.0)
Hemoglobin: 11.5 g/dL — ABNORMAL LOW (ref 12.0–15.0)
Immature Granulocytes: 0 %
Lymphocytes Relative: 27 %
Lymphs Abs: 1.4 10*3/uL (ref 0.7–4.0)
MCH: 30 pg (ref 26.0–34.0)
MCHC: 33.8 g/dL (ref 30.0–36.0)
MCV: 88.8 fL (ref 80.0–100.0)
Monocytes Absolute: 0.6 10*3/uL (ref 0.1–1.0)
Monocytes Relative: 10 %
Neutro Abs: 3.2 10*3/uL (ref 1.7–7.7)
Neutrophils Relative %: 61 %
Platelets: 170 10*3/uL (ref 150–400)
RBC: 3.83 MIL/uL — ABNORMAL LOW (ref 3.87–5.11)
RDW: 13.6 % (ref 11.5–15.5)
WBC: 5.4 10*3/uL (ref 4.0–10.5)
nRBC: 0 % (ref 0.0–0.2)

## 2021-01-19 MED ORDER — TAMOXIFEN CITRATE 20 MG PO TABS: ORAL_TABLET | ORAL | 4 refills | Status: DC

## 2021-01-19 NOTE — Progress Notes (Signed)
[place]  [phone] [phone]     ID: Alexis [name] DOB: [birth year]  MR#: [id number]  CVU#:[id number]  Patient Care Team: Tanda Rockers, MD as PCP - General (Pulmonary Disease) Alphonsa Overall, MD as Consulting Physician (General Surgery) Saban Heinlen, Virgie Dad, MD as Consulting Physician (Oncology) Kyung Rudd, MD as Consulting Physician (Radiation Oncology) Allyn Kenner, MD (Dermatology) Regal, Tamala Fothergill, DPM as Consulting Physician (Podiatry) OTHER MD:   CHIEF COMPLAINT: estrogen receptor positive breast cancer  CURRENT TREATMENT:  Tamoxifen   INTERVAL HISTORY: Alexis [name] returns today for follow-up of her estrogen receptor positive breast cancer.  She is accompanied by her husband Clare Gandy  She continues on tamoxifen.  Hot flashes are minimal.  She has a mild vaginal wetness and that is actually favorable in her case as it prevents dyspareunia.  For that reason also well as others she would like to continue on tamoxifen for an additional 5 years  Since her last visit, she underwent bilateral diagnostic mammography with tomography at [facility] on 11/21/2020 showing: breast density category C; no evidence of malignancy in either breast.    REVIEW OF SYSTEMS: Alexis [name] does not exercise regularly as such but she is on her feet and active all day and she is certainly not overweight.  They are busy with their grandchildren and they are about to have their first great grandchild.  She is planning to have her right hip done by Dr.[name] later this month.  He did her contralateral hip in the past and that was very successful.  A detailed review of systems today was otherwise noncontributory   COVID 19 VACCINATION STATUS: Status post Moderna x2   BREAST CANCER HISTORY: From the original intake note:  Alexis [name] had routine mammographic screening with tomography at the [facility] 11/04/2015. This showed an area of distortion and calcifications in the right  breast.  Right diagnostic mammography with tomography and right breast ultrasonography at the [facility] 11/15/2015 on the breast density to be categoryC.  In the upper-outer quadrant of the right breast there was an area of distortion associated with pleomorphic calcifications measuring approximately 1 cm. On exam there was no palpable mass.  By ultrasonography, there was an irregular hypoechoic mass superiorly measuring 1.2 cm. Ultrasound of the axilla was benign.  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  Her subsequent history is as detailed below   PAST MEDICAL HISTORY: Past Medical History:  Diagnosis Date   Allergy    Asthma    environmental now resolved   Breast cancer ([place]) 11/16/2015   Right Breast   Chronic fatigue    Cough    Family history of breast cancer    Family history of lung cancer    GERD (gastroesophageal reflux disease)    Heart murmur    "years ago, when I was young"   Hemangioma    History of hiatal hernia    Hyperlipidemia    IBS (irritable bowel syndrome)    Iron deficiency anemia    Kyphosis of thoracic region    MVP (mitral valve prolapse)    Osteopenia    Personal history of radiation therapy    SAH (subarachnoid hemorrhage) (HCC)    Vertigo  PAST SURGICAL HISTORY: Past Surgical History:  Procedure Laterality Date   APPENDECTOMY     BREAST BIOPSY Right 11/16/2015   U/S Core- Malignant   BREAST BIOPSY Left 06/21/2005   Stereo- Benign   BREAST EXCISIONAL BIOPSY Left 2007   BREAST LUMPECTOMY Right 12/22/2015   BREAST LUMPECTOMY WITH RADIOACTIVE SEED AND SENTINEL LYMPH NODE BIOPSY Right 12/22/2015   Procedure: BREAST LUMPECTOMY WITH  RADIOACTIVE SEED AND SENTINEL LYMPH NODE BIOPSY;  Surgeon: Alphonsa Overall, MD;  Location: [facility];  Service: General;  Laterality: Right;   COLONOSCOPY     FOOT SURGERY Right    bunionectomy   MOUTH SURGERY     [facility]  2003   s/p embolization per Dr. Estanislado Pandy   TONSILLECTOMY     TOTAL HIP ARTHROPLASTY Left 01/11/2017   Procedure: LEFT TOTAL HIP ARTHROPLASTY ANTERIOR APPROACH;  Surgeon: [name], MD;  Location: [facility];  Service: Orthopedics;  Laterality: Left;   TUBAL LIGATION     UPPER GASTROINTESTINAL ENDOSCOPY      FAMILY HISTORY Family History  Problem Relation Age of Onset   Hyperlipidemia Mother    Breast cancer Mother 74   Heart attack Father    Diabetes Brother    Breast cancer Maternal Aunt        diagnosed 48s or 27s   Breast cancer Paternal Aunt 41       diagnosed in her 60s   Lung cancer Paternal Uncle    Colon cancer Neg Hx    Colon polyps Neg Hx    Esophageal cancer Neg Hx    Rectal cancer Neg Hx    Stomach cancer Neg Hx   the patient's father died from a ruptured aneurysm at the age of 52. The patient's mother was diagnosed with breast cancer at the age of 4 and died at age 52. The patient has a paternal aunt diagnosed with breast cancer at age 72. The patient has 2 brothers, 2 sisters. Ne brother has had a history of aneurysms, as has the patient There is no history of ovarian cancer in the family.   GYNECOLOGIC HISTORY:  No LMP recorded. Patient is postmenopausal. Menarche age 67, first live birth age 10, the patient is [place] P2. She stopped having periods at age 14.He did not take hormone replacement. 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 Her husband retired from     age 3. Daughter Alexis [name] lives in [place] where she works as an Medical illustrator. (Adopted) son Alexis [name] lives in [place] where he is  a Museum/gallery curator. Daughter Alexis [name] lives in [place] teaching second grade. The patient has 3 grandchildren . She attends a [employer]    ADVANCED DIRECTIVES: In the absence of any documentation to the contrary, the patient's spouse is their HCPOA.    HEALTH MAINTENANCE: Social History   Tobacco Use   Smoking status: Never   Smokeless tobacco: Never  Vaping Use   Vaping Use: Never used  Substance Use Topics   Alcohol use: No   Drug use: No     Colonoscopy: 2012/[facility]  PAP:2012  Bone density: 2015  Lipid panel:  Allergies  Allergen Reactions   Other Itching    Jell-o   Latex Rash    Contact rash    Current Outpatient Medications  Medication Sig Dispense Refill   alendronate (  FOSAMAX) 70 MG tablet Take 1 tablet (70 mg total) by mouth once a week. Take with a full glass of water on an empty stomach. 4 tablet 11   aspirin EC 81 MG tablet Take 81 mg by mouth daily.     B Complex-C (SUPER B COMPLEX/VITAMIN C PO) Take 1 tablet by mouth daily.     Calcium Carbonate-Vitamin D (CALCIUM 600/VITAMIN D PO) Take 1 tablet by mouth daily.     ciprofloxacin (CIPRO) 500 MG tablet Take 1 tablet (500 mg total) by mouth 2 (two) times daily. 14 tablet 0   diazepam (VALIUM) 5 MG tablet Take as directed prior to procedure. (Patient not taking: No sig reported) 3 tablet 0   docusate sodium (COLACE) 100 MG capsule Take 100 mg by mouth 2 (two) times daily.     pravastatin (PRAVACHOL) 40 MG tablet TAKE 1 TABLET BY MOUTH EVERY DAY IN THE EVENING 90 tablet 3   psyllium (REGULOID) 0.52 g capsule Take 0.52 g by mouth daily.     tamoxifen (NOLVADEX) 20 MG tablet TAKE 1 TABLET BY MOUTH EVERYDAY AT BEDTIME 90 tablet 4   No current facility-administered medications for this visit.    OBJECTIVE: white woman who appears younger than stated age 41:   01/19/21 1124  BP: (!) 164/67  Pulse: 64  Resp: 16  Temp: 98.1 F (36.7 C)  SpO2: 100%      Body mass index is 17.83 kg/m.     ECOG FS:1 - Symptomatic but completely ambulatory  Sclerae unicteric, EOMs intact Wearing a mask No cervical or supraclavicular adenopathy Lungs no rales or rhonchi Heart regular rate and rhythm Abd soft, nontender, positive bowel sounds MSK no focal spinal tenderness, no upper extremity lymphedema Neuro: nonfocal, well oriented, appropriate affect Breasts: Status post right lumpectomy and radiation.  The cosmetic result is excellent.  There is no evidence of local recurrence.  Both axillae are benign.   LAB RESULTS:  CMP     Component Value Date/Time   NA 136 05/30/2020 1108   NA 144 12/26/2016 1105   K 3.6 05/30/2020 1108   K 4.0 12/26/2016 1105   CL 100 05/30/2020 1108   CO2 28 05/30/2020 1108   CO2 29 12/26/2016 1105   GLUCOSE 100 (H) 05/30/2020 1108   GLUCOSE 92 12/26/2016 1105   GLUCOSE 106 (H) 05/24/2006 0950   BUN 12 05/30/2020 1108   BUN 12.2 12/26/2016 1105   CREATININE 0.56 05/30/2020 1108   CREATININE 0.72 02/03/2019 1157   CREATININE 0.7 12/26/2016 1105   CALCIUM 8.8 (L) 05/30/2020 1108   CALCIUM 9.7 12/26/2016 1105   PROT 6.8 05/30/2020 1108   PROT 6.7 12/26/2016 1105   ALBUMIN 4.0 05/30/2020 1108   ALBUMIN 3.7 12/26/2016 1105   AST 30 05/30/2020 1108   AST 26 02/03/2019 1157   AST 22 12/26/2016 1105   ALT 22 05/30/2020 1108   ALT 20 02/03/2019 1157   ALT 17 12/26/2016 1105   ALKPHOS 38 05/30/2020 1108   ALKPHOS 49 12/26/2016 1105   BILITOT 0.8 05/30/2020 1108   BILITOT 0.5 02/03/2019 1157   BILITOT 0.57 12/26/2016 1105   GFRNONAA >60 05/30/2020 1108   GFRNONAA >60 02/03/2019 1157   GFRAA >60 02/03/2019 1157    INo results found for: SPEP, UPEP  Lab Results  Component Value Date   WBC 5.4 01/19/2021   NEUTROABS 3.2 01/19/2021   HGB 11.5 (L) 01/19/2021   HCT 34.0 (L) 01/19/2021   MCV 88.8 01/19/2021  PLT 170 01/19/2021      Chemistry      Component Value Date/Time   NA 136 05/30/2020 1108   NA 144 12/26/2016 1105   K 3.6 05/30/2020  1108   K 4.0 12/26/2016 1105   CL 100 05/30/2020 1108   CO2 28 05/30/2020 1108   CO2 29 12/26/2016 1105   BUN 12 05/30/2020 1108   BUN 12.2 12/26/2016 1105   CREATININE 0.56 05/30/2020 1108   CREATININE 0.72 02/03/2019 1157   CREATININE 0.7 12/26/2016 1105      Component Value Date/Time   CALCIUM 8.8 (L) 05/30/2020 1108   CALCIUM 9.7 12/26/2016 1105   ALKPHOS 38 05/30/2020 1108   ALKPHOS 49 12/26/2016 1105   AST 30 05/30/2020 1108   AST 26 02/03/2019 1157   AST 22 12/26/2016 1105   ALT 22 05/30/2020 1108   ALT 20 02/03/2019 1157   ALT 17 12/26/2016 1105   BILITOT 0.8 05/30/2020 1108   BILITOT 0.5 02/03/2019 1157   BILITOT 0.57 12/26/2016 1105       No results found for: LABCA2  No components found for: LABCA125  No results for input(s): INR in the last 168 hours.  Urinalysis    Component Value Date/Time   COLORURINE STRAW (A) 01/01/2017 1630   APPEARANCEUR CLEAR 01/01/2017 1630   LABSPEC 1.013 01/01/2017 1630   PHURINE 6.0 01/01/2017 1630   GLUCOSEU NEGATIVE 01/01/2017 1630   GLUCOSEU NEGATIVE 04/21/2015 0932   HGBUR NEGATIVE 01/01/2017 1630   BILIRUBINUR negative 11/15/2020 1033   KETONESUR negative 11/15/2020 1033   KETONESUR NEGATIVE 01/01/2017 1630   PROTEINUR negative 11/15/2020 1033   PROTEINUR NEGATIVE 01/01/2017 1630   UROBILINOGEN 0.2 11/15/2020 1033   UROBILINOGEN 0.2 04/21/2015 0932   NITRITE Positive (A) 11/15/2020 1033   NITRITE NEGATIVE 01/01/2017 1630   LEUKOCYTESUR Large (3+) (A) 11/15/2020 1033    ELIGIBLE FOR AVAILABLE RESEARCH PROTOCOL: no  STUDIES: No results found.   ASSESSMENT: 70 y.o. [place] woman status post right breast upper outer quadrant biopsy 11/16/2015 for a clinical T1b N0, stage IA invasive ductal carcinoma, grade 1, estrogen receptor positive, progesterone receptor negative, HER-2 nonamplified, with an MIB-1 of 5%.  (1) status post right lumpectomy 12/22/2015 for a pT1c pN0, stage IA  invasive ductal carcinoma,  grade 1, repeat HER-2 again negative  (2) Oncotype DX score of 21 predicts a risk of recurrence outside the breast over the next 10 years of 14% if the patient's only systemic therapy is tamoxifen for 5 years. It predicts a benefit from chemotherapy in the 3-4% range.  (3) adjuvant radiation 02/06/2016 through 03/05/2016  Site/dose:   The patient initially received a dose of 42.5 Gy in 17 fractions to the breast using whole-breast tangent fields. This was delivered using a 3-D conformal technique. The patient then received a boost to the seroma. This delivered an additional 7.5 Gy in 3 fractions using an en face electron field due to the depth of the seroma. The total dose was 50 Gy.   (4) Started tamoxifen October 2017, to be continued total 10 years  (a) bone density at [facility] 04/16/2014 found osteopenia with a T score of -1.6  (5) genetics testing 02/03/2019 through the [facility] Common Hereditary Cancers panel found no deleterious mutations in. APC, ATM, AXIN2, BARD1, BMPR1A, BRCA1, BRCA2, BRIP1, CDH1, CDK4, CDKN2A (p14ARF), CDKN2A (p16INK4a), CHEK2, CTNNA1, DICER1, EPCAM (Deletion/duplication testing only), GREM1 (promoter region deletion/duplication testing only), KIT, MEN1, MLH1, MSH2, MSH3, MSH6, MUTYH, NBN, NF1, NHTL1,  PALB2, PDGFRA, PMS2, POLD1, POLE, PTEN, RAD50, RAD51C, RAD51D, RNF43, SDHB, SDHC, SDHD, SMAD4, SMARCA4. STK11, TP53, TSC1, TSC2, and VHL.  The following genes were evaluated for sequence changes only: SDHA and HOXB13 c.251G>A variant only.   (6) dense breasts: Opted against yearly MRI in addition to mammography   PLAN: Tephanie has completed 5 years of tamoxifen and is a candidate to "graduate" from breast cancer follow-up.  However she is very interested in continuing the tamoxifen.  Certainly we have data to continue tamoxifen up to 10years for breast cancer risk reduction, even though the majority of the benefit is accrued in the first 5 years.  Tamoxifen of course  also helps with bone density and she is currently off alendronate.  Finally tamoxifen is making it possible for the patient and her husband to have a more normal intimacy and in fact if she needed to use vaginal estrogens a couple of times a week for vaginal atrophy symptoms so long she is under tamoxifen and I would not have a problem with that.  The plan then is to continue tamoxifen for total of 10 years.  She will need yearly follow-up with [place] and at this point she would like to switch her care to my partner in [place] Dr. Delton Coombes.  I have requested an appointment with him a year from now.  She will have of course her mammogram as usual in June 2023  She knows to call for any other issue that may develop before the next visit.  Total encounter time 25 minutes.*   Jilliane Kazanjian, Virgie Dad, MD  01/19/21 11:28 AM Medical Oncology and Hematology [facility] [place], [place] [address] Tel. [phone]    Fax. [phone]   I, Wilburn Mylar, am acting as scribe for Dr. Virgie Dad. Jinny Sweetland.  I, Lurline Del MD, have reviewed the above documentation for accuracy and completeness, and I agree with the above.   *Total Encounter Time as defined by the Centers for Medicare and Medicaid Services includes, in addition to the face-to-face time of a patient visit (documented in the note above) non-face-to-face time: obtaining and reviewing outside history, ordering and reviewing medications, tests or procedures, care coordination (communications with other health care professionals or caregivers) and documentation in the medical record.

## 2021-01-26 ENCOUNTER — Encounter: Payer: Self-pay | Admitting: Surgery

## 2021-01-26 ENCOUNTER — Encounter (HOSPITAL_COMMUNITY)
Admission: RE | Admit: 2021-01-26 | Discharge: 2021-01-26 | Disposition: A | Discharge status: Home / Self Care | Payer: Medicare Other | Source: Ambulatory Visit | Attending: Orthopaedic Surgery | Admitting: Orthopaedic Surgery

## 2021-01-26 ENCOUNTER — Ambulatory Visit (INDEPENDENT_AMBULATORY_CARE_PROVIDER_SITE_OTHER): Payer: Medicare Other | Admitting: Surgery

## 2021-01-26 ENCOUNTER — Other Ambulatory Visit: Payer: Self-pay

## 2021-01-26 ENCOUNTER — Encounter (HOSPITAL_COMMUNITY): Payer: Self-pay

## 2021-01-26 VITALS — BP 138/72 | HR 59 | Ht 62.0 in | Wt 110.4 lb

## 2021-01-26 DIAGNOSIS — Z01812 Encounter for preprocedural laboratory examination: Secondary | ICD-10-CM | POA: Insufficient documentation

## 2021-01-26 DIAGNOSIS — M25551 Pain in right hip: Secondary | ICD-10-CM

## 2021-01-26 DIAGNOSIS — M1611 Unilateral primary osteoarthritis, right hip: Secondary | ICD-10-CM

## 2021-01-26 DIAGNOSIS — M1711 Unilateral primary osteoarthritis, right knee: Secondary | ICD-10-CM

## 2021-01-26 HISTORY — DX: Unspecified osteoarthritis, unspecified site: M19.90

## 2021-01-26 LAB — TYPE AND SCREEN
ABO/RH(D): A POS
Antibody Screen: NEGATIVE

## 2021-01-26 LAB — BASIC METABOLIC PANEL
Anion gap: 6 (ref 5–15)
BUN: 16 mg/dL (ref 8–23)
CO2: 30 mmol/L (ref 22–32)
Calcium: 8.7 mg/dL — ABNORMAL LOW (ref 8.9–10.3)
Chloride: 103 mmol/L (ref 98–111)
Creatinine, Ser: 0.86 mg/dL (ref 0.44–1.00)
GFR, Estimated: >60 mL/min (ref >60)
Glucose, Bld: 178 mg/dL — ABNORMAL HIGH (ref 70–99)
Potassium: 3.8 mmol/L (ref 3.5–5.1)
Sodium: 139 mmol/L (ref 135–145)

## 2021-01-26 LAB — SURGICAL PCR SCREEN
MRSA, PCR: NEGATIVE
Staphylococcus aureus: NEGATIVE

## 2021-01-26 NOTE — Progress Notes (Signed)
  70 year old white female history of end-stage DJD right hip and pain comes in for preop evaluation.  Hip symptoms unchanged from previous visit.  She is wanting to proceed with right total hip replacement as scheduled.  Today history physical performed.  Review of systems negative.  Patient is aware of what to expect from the surgeon procedure since she had left total hip replacement performed 2016.  States that hip did well.  Today I discussed the possibility of having home health PT come out to her home after surgery.  Patient and her husband state that they would like to have this done in hopes of speeding up her recovery.  I think this is very reasonable.  All questions answered.

## 2021-01-26 NOTE — Progress Notes (Signed)
Surgical Instructions    Your procedure is scheduled on Friday August 19th.  Report to [facility] Main Entrance "A" at 5:30 A.M., then check in with the Admitting office.  Call this number if you have problems the morning of surgery:  [phone]   If you have any questions prior to your surgery date call [phone]: Open Monday-Friday 8am-4pm    Remember:  Do not eat after midnight the night before your surgery  You may drink clear liquids until 4:30 am the morning of your surgery.   Clear liquids allowed are: Water, Non-Citrus Juices (without pulp), Carbonated Beverages, Clear Tea, Black Coffee Only, and Gatorade    Take these medicines the morning of surgery with A SIP OF WATER psyllium (REGULOID)    As of today, STOP taking any Aspirin (unless otherwise instructed by your surgeon) Aleve, Naproxen, Ibuprofen, Motrin, Advil, Goody's, BC's, all herbal medications, fish oil, and all vitamins.          Do not wear jewelry or makeup Do not wear lotions, powders, perfumes, or deodorant. Do not shave 48 hours prior to surgery.   Do not bring valuables to the hospital.  DO Not wear nail polish, gel polish, artificial nails, or any other type of covering on natural nails  including finger and toenails. If patients have artificial nails, gel coating, etc. that need to be removed by a nail salon please have this removed prior to surgery or surgery may need to be canceled/delayed if the surgeon/ anesthesia feels like the patient is unable to be adequately monitored.             [facility] is not responsible for any belongings or valuables.  Do NOT Smoke (Tobacco/Vaping) or drink Alcohol 24 hours prior to your procedure If you use a CPAP at night, you may bring all equipment for your overnight stay.   Contacts, glasses, dentures or bridgework may not be worn into surgery, please bring cases for these belongings   For patients admitted to the hospital, discharge time will be  determined by your treatment team.   Patients discharged the day of surgery will not be allowed to drive home, and someone needs to stay with them for 24 hours.  ONLY 1 SUPPORT PERSON MAY BE PRESENT WHILE YOU ARE IN SURGERY. IF YOU ARE TO BE ADMITTED ONCE YOU ARE IN YOUR ROOM YOU WILL BE ALLOWED TWO (2) VISITORS.  Minor children may have two parents present. Special consideration for safety and communication needs will be reviewed on a case by case basis.  Special instructions:    Oral Hygiene is also important to reduce your risk of infection.  Remember - BRUSH YOUR TEETH THE MORNING OF SURGERY WITH YOUR REGULAR TOOTHPASTE   [facility]- Preparing For Surgery  Before surgery, you can play an important role. Because skin is not sterile, your skin needs to be as free of germs as possible. You can reduce the number of germs on your skin by washing with CHG (chlorahexidine gluconate) Soap before surgery.  CHG is an antiseptic cleaner which kills germs and bonds with the skin to continue killing germs even after washing.     Please do not use if you have an allergy to CHG or antibacterial soaps. If your skin becomes reddened/irritated stop using the CHG.  Do not shave (including legs and underarms) for at least 48 hours prior to first CHG shower. It is OK to shave your face.  Please follow these instructions carefully.  Shower the NIGHT BEFORE SURGERY and the MORNING OF SURGERY with CHG Soap.   If you chose to wash your hair, wash your hair first as usual with your normal shampoo. After you shampoo, rinse your hair and body thoroughly to remove the shampoo.  Then [employer] and genitals (private parts) with your normal soap and rinse thoroughly to remove soap.  After that Use CHG Soap as you would any other liquid soap. You can apply CHG directly to the skin and wash gently with a scrungie or a clean washcloth.   Apply the CHG Soap to your body ONLY FROM THE NECK DOWN.  Do not use on open  wounds or open sores. Avoid contact with your eyes, ears, mouth and genitals (private parts). Wash Face and genitals (private parts)  with your normal soap.   Wash thoroughly, paying special attention to the area where your surgery will be performed.  Thoroughly rinse your body with warm water from the neck down.  DO NOT shower/wash with your normal soap after using and rinsing off the CHG Soap.  Pat yourself dry with a CLEAN TOWEL.  Wear CLEAN PAJAMAS to bed the night before surgery  Place CLEAN SHEETS on your bed the night before your surgery  DO NOT SLEEP WITH PETS.   Day of Surgery: Take a shower with CHG soap. Wear Clean/Comfortable clothing the morning of surgery Do not apply any deodorants/lotions.   Remember to brush your teeth WITH YOUR REGULAR TOOTHPASTE.   Please read over the following fact sheets that you were given.

## 2021-01-26 NOTE — Progress Notes (Signed)
PCP - michael wert Cardiologist - denies Oncologist: Dr. Jana Hakim  PPM/ICD - denies   Chest x-ray - n/a EKG - n/a Stress Test -07/06/05  ECHO - denies Cardiac Cath - denies  Sleep Study - denies   No diabetes  As of today, STOP taking any Aspirin (unless otherwise instructed by your surgeon) Aleve, Naproxen, Ibuprofen, Motrin, Advil, Goody's, BC's, all herbal medications, fish oil, and all vitamins.  ERAS Protcol -yes   COVID TEST- instructed patient to go to [facility] location 8/15,8/16, or 8/17   Anesthesia review: no  Patient denies shortness of breath, fever, cough and chest pain at PAT appointment   All instructions explained to the patient, with a verbal understanding of the material. Patient agrees to go over the instructions while at home for a better understanding. Patient also instructed to self quarantine after being tested for COVID-19. The opportunity to ask questions was provided.

## 2021-01-26 NOTE — Progress Notes (Signed)
Surgical Instructions    Your procedure is scheduled on Friday August 19th.  Report to [facility] Main Entrance "A" at 5:30 A.M., then check in with the Admitting office.  Call this number if you have problems the morning of surgery:  [phone]   If you have any questions prior to your surgery date call [phone]: Open Monday-Friday 8am-4pm    Remember:  Do not eat after midnight the night before your surgery  You may drink clear liquids until 4:30 am the morning of your surgery.   Clear liquids allowed are: Water, Non-Citrus Juices (without pulp), Carbonated Beverages, Clear Tea, Black Coffee Only, and Gatorade    Take these medicines the morning of surgery with A SIP OF WATER  NONE  Follow your surgeon's instructions on when to stop Aspirin.  If no instructions were given by your surgeon then you will need to call the office to get those instructions.     As of today, STOP taking any Aspirin (unless otherwise instructed by your surgeon) Aleve, Naproxen, Ibuprofen, Motrin, Advil, Goody's, BC's, all herbal medications, fish oil, and all vitamins.          Do not wear jewelry or makeup Do not wear lotions, powders, perfumes, or deodorant. Do not shave 48 hours prior to surgery.   Do not bring valuables to the hospital. DO Not wear nail polish, gel polish, artificial nails, or any other type of covering on  natural nails including finger and toenails. If patients have artificial nails, gel coating, etc. that need to be removed by a nail salon please have this removed prior to surgery or surgery may need to be canceled/delayed if the surgeon/ anesthesia feels like the patient is unable to be adequately monitored.             [facility] is not responsible for any belongings or valuables.  Do NOT Smoke (Tobacco/Vaping) or drink Alcohol 24 hours prior to your procedure If you use a CPAP at night, you may bring all equipment for your overnight stay.   Contacts, glasses, dentures  or bridgework may not be worn into surgery, please bring cases for these belongings   For patients admitted to the hospital, discharge time will be determined by your treatment team.   Patients discharged the day of surgery will not be allowed to drive home, and someone needs to stay with them for 24 hours.  ONLY 1 SUPPORT PERSON MAY BE PRESENT WHILE YOU ARE IN SURGERY. IF YOU ARE TO BE ADMITTED ONCE YOU ARE IN YOUR ROOM YOU WILL BE ALLOWED TWO (2) VISITORS.  Minor children may have two parents present. Special consideration for safety and communication needs will be reviewed on a case by case basis.  Special instructions:    Oral Hygiene is also important to reduce your risk of infection.  Remember - BRUSH YOUR TEETH THE MORNING OF SURGERY WITH YOUR REGULAR TOOTHPASTE   [facility]- Preparing For Surgery  Before surgery, you can play an important role. Because skin is not sterile, your skin needs to be as free of germs as possible. You can reduce the number of germs on your skin by washing with CHG (chlorahexidine gluconate) Soap before surgery.  CHG is an antiseptic cleaner which kills germs and bonds with the skin to continue killing germs even after washing.     Please do not use if you have an allergy to CHG or antibacterial soaps. If your skin becomes reddened/irritated stop using the CHG.  Do not shave (including legs and underarms) for at least 48 hours prior to first CHG shower. It is OK to shave your face.  Please follow these instructions carefully.     Shower the NIGHT BEFORE SURGERY and the MORNING OF SURGERY with CHG Soap.   If you chose to wash your hair, wash your hair first as usual with your normal shampoo. After you shampoo, rinse your hair and body thoroughly to remove the shampoo.  Then [employer] and genitals (private parts) with your normal soap and rinse thoroughly to remove soap.  After that Use CHG Soap as you would any other liquid soap. You can apply CHG directly  to the skin and wash gently with a scrungie or a clean washcloth.   Apply the CHG Soap to your body ONLY FROM THE NECK DOWN.  Do not use on open wounds or open sores. Avoid contact with your eyes, ears, mouth and genitals (private parts). Wash Face and genitals (private parts)  with your normal soap.   Wash thoroughly, paying special attention to the area where your surgery will be performed.  Thoroughly rinse your body with warm water from the neck down.  DO NOT shower/wash with your normal soap after using and rinsing off the CHG Soap.  Pat yourself dry with a CLEAN TOWEL.  Wear CLEAN PAJAMAS to bed the night before surgery  Place CLEAN SHEETS on your bed the night before your surgery  DO NOT SLEEP WITH PETS.   Day of Surgery:  Take a shower with CHG soap. Wear Clean/Comfortable clothing the morning of surgery Do not apply any deodorants/lotions.   Remember to brush your teeth WITH YOUR REGULAR TOOTHPASTE.   Please read over the following fact sheets that you were given.

## 2021-01-31 ENCOUNTER — Other Ambulatory Visit: Payer: Self-pay | Admitting: Orthopaedic Surgery

## 2021-01-31 NOTE — H&P (Signed)
TOTAL HIP ADMISSION H&P  Patient is admitted for right total hip arthroplasty.  Subjective:  Chief Complaint: right hip pain  HPI: Alexis [name], 70 y.o. female, has a history of pain and functional disability in the right hip(s) due to arthritis and patient has failed non-surgical conservative treatments for greater than 12 weeks to include NSAID's and/or analgesics, use of assistive devices, weight reduction as appropriate, and activity modification.  Onset of symptoms was gradual starting 10 years ago with gradually worsening course since that time.The patient noted no past surgery on the right hip(s).  Patient currently rates pain in the right hip at 10 out of 10 with activity. Patient has worsening of pain with activity and weight bearing, trendelenberg gait, pain that interfers with activities of daily living, and pain with passive range of motion. Patient has evidence of subchondral sclerosis, periarticular osteophytes, and joint space narrowing by imaging studies. This condition presents safety issues increasing the risk of falls. .  There is no current active infection.  Patient Active Problem List   Diagnosis Date Noted   Unilateral primary osteoarthritis, right hip 06/24/2020   Trochanteric bursitis, right hip 06/23/2020   Thrombocytopenia ([place]) 05/30/2020   Genetic testing 02/09/2019   Family history of breast cancer    Family history of lung cancer    Cerumen impaction 06/05/2017   Upper airway cough syndrome 04/30/2017   Palpitations 04/30/2017   Chronic bilateral low back pain 02/28/2017   History of total left hip arthroplasty 01/24/2017   Malignant neoplasm of upper-outer quadrant of right breast in female, estrogen receptor positive ([place]) 11/17/2015   Breast mass 11/15/2015   Radicular pain of left lower extremity 10/28/2015   Microcytic anemia 04/22/2015   Health care maintenance 03/25/2013   Arthralgia 07/27/2011   Premature atrial contractions 06/24/2009   DIZZINESS  OR VERTIGO 05/30/2007   HEMANGIOMA 05/02/2007   Hyperlipidemia LDL goal <160 05/02/2007   SAH  h/o 2003 05/02/2007   GERD 05/02/2007   IRRITABLE BOWEL SYNDROME 05/02/2007   Osteoporosis 05/02/2007   KYPHOSCOLIOSIS, 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   FATIGUE, CHRONIC 05/02/2007   COUGH 05/02/2007   Past Medical History:  Diagnosis Date   Allergy    Arthritis    bilateral hips   Asthma    environmental now resolved   Breast cancer ([place]) 11/16/2015   Right Breast   Chronic fatigue    Cough    Family history of breast cancer    Family history of lung cancer    GERD (gastroesophageal reflux disease)    Heart murmur    "years ago, when I was young"   Hemangioma    History of hiatal hernia    Hyperlipidemia    IBS (irritable bowel syndrome)    Iron deficiency anemia    Kyphosis of thoracic region    MVP (mitral valve prolapse)    Osteopenia    Personal history of radiation therapy    SAH (subarachnoid hemorrhage) ([place])    Vertigo     Past Surgical History:  Procedure Laterality Date   APPENDECTOMY     BREAST BIOPSY Right 11/16/2015   U/S Core- Malignant   BREAST BIOPSY Left 06/21/2005   Stereo- Benign   BREAST EXCISIONAL BIOPSY Left 2007   BREAST LUMPECTOMY Right 12/22/2015   BREAST LUMPECTOMY WITH RADIOACTIVE SEED AND SENTINEL LYMPH NODE BIOPSY Right 12/22/2015   Procedure: BREAST LUMPECTOMY WITH RADIOACTIVE SEED AND SENTINEL LYMPH NODE BIOPSY;  Surgeon: Alphonsa Overall, MD;  Location: [facility];  Service: General;  Laterality: Right;   COLONOSCOPY     FOOT SURGERY Right    bunionectomy   MOUTH SURGERY     [facility]  2003   s/p embolization per Dr. Estanislado Pandy   TONSILLECTOMY     TOTAL HIP ARTHROPLASTY Left 01/11/2017   Procedure: LEFT TOTAL HIP ARTHROPLASTY ANTERIOR APPROACH;  Surgeon: [name], MD;  Location: [facility];  Service: Orthopedics;  Laterality: Left;   TUBAL LIGATION     UPPER GASTROINTESTINAL ENDOSCOPY      No current facility-administered medications  for this encounter.   Current Outpatient Medications  Medication Sig Dispense Refill Last Dose   aspirin EC 81 MG tablet Take 81 mg by mouth in the morning.      B Complex-C (SUPER B COMPLEX/VITAMIN C PO) Take 1 tablet by mouth every evening.      chlorpheniramine (CHLOR-TRIMETON) 4 MG tablet Take 4 mg by mouth at bedtime.      Cholecalciferol (VITAMIN D3) 50 MCG (2000 UT) TABS Take 2,000 Units by mouth in the morning.      docusate sodium (COLACE) 100 MG capsule Take 100 mg by mouth in the morning and at bedtime.      pravastatin (PRAVACHOL) 40 MG tablet TAKE 1 TABLET BY MOUTH EVERY DAY IN THE EVENING 90 tablet 3    psyllium (REGULOID) 0.52 g capsule Take 0.52 g by mouth in the morning and at bedtime.      tamoxifen (NOLVADEX) 20 MG tablet TAKE 1 TABLET BY MOUTH EVERYDAY AT BEDTIME 90 tablet 4    Allergies  Allergen Reactions   Other Itching    Jell-o   Latex Rash    Contact rash    Social History   Tobacco Use   Smoking status: Never   Smokeless tobacco: Never  Substance Use Topics   Alcohol use: No    Family History  Problem Relation Age of Onset   Hyperlipidemia Mother    Breast cancer Mother 41   Heart attack Father    Diabetes Brother    Breast cancer Maternal Aunt        diagnosed 62s or 26s   Breast cancer Paternal Aunt 42       diagnosed in her 71s   Lung cancer Paternal Uncle    Colon cancer Neg Hx    Colon polyps Neg Hx    Esophageal cancer Neg Hx    Rectal cancer Neg Hx    Stomach cancer Neg Hx      Review of Systems  Constitutional:  Positive for activity change.  HENT: Negative.    Respiratory: Negative.    Cardiovascular: Negative.   Gastrointestinal: Negative.   Genitourinary: Negative.   Musculoskeletal:  Positive for gait problem.  Psychiatric/Behavioral: Negative.     Objective:  Physical Exam Constitutional:      Appearance: Normal appearance.  HENT:     Head: Normocephalic.  Eyes:     Extraocular Movements: Extraocular movements  intact.  Cardiovascular:     Rate and Rhythm: Regular rhythm.     Heart sounds: Normal heart sounds.  Pulmonary:     Effort: No respiratory distress.     Breath sounds: Normal breath sounds.  Musculoskeletal:        General: Tenderness present.  Neurological:     Mental Status: She is alert and oriented to person, place, and time.  Psychiatric:        Mood and Affect: Mood normal.    Vital signs in  last 24 hours:    Labs:   Estimated body mass index is 20.36 kg/m as calculated from the following:   Height as of 01/26/21: '5\' 2"'$  (1.575 m).   Weight as of 01/26/21: 50.5 kg.   Imaging Review Plain radiographs demonstrate moderate degenerative joint disease of the right hip(s). The bone quality appears to be good for age and reported activity level.      Assessment/Plan:  End stage arthritis, right hip(s)  The patient history, physical examination, clinical judgement of the provider and imaging studies are consistent with end stage degenerative joint disease of the right hip(s) and total hip arthroplasty is deemed medically necessary. The treatment options including medical management, injection therapy, arthroscopy and arthroplasty were discussed at length. The risks and benefits of total hip arthroplasty were presented and reviewed. The risks due to aseptic loosening, infection, stiffness, dislocation/subluxation,  thromboembolic complications and other imponderables were discussed.  The patient acknowledged the explanation, agreed to proceed with the plan and consent was signed. Patient is being admitted for inpatient treatment for surgery, pain control, PT, OT, prophylactic antibiotics, VTE prophylaxis, progressive ambulation and ADL's and discharge planning.The patient is planning to be discharged home with home health services

## 2021-02-01 LAB — SARS CORONAVIRUS 2 (TAT 6-24 HRS): SARS Coronavirus 2: NEGATIVE

## 2021-02-02 ENCOUNTER — Telehealth: Payer: Self-pay | Admitting: *Deleted

## 2021-02-02 NOTE — Anesthesia Preprocedure Evaluation (Signed)
Anesthesia Evaluation    Airway Mallampati: I  TM Distance: >3 FB Neck ROM: Full    Dental no notable dental hx. (+) Teeth Intact   Pulmonary asthma ,    Pulmonary exam normal breath sounds clear to auscultation       Cardiovascular Normal cardiovascular exam+ Valvular Problems/Murmurs MVP  Rhythm:Regular Rate:Normal     Neuro/Psych PSYCHIATRIC DISORDERS H/o SAH    GI/Hepatic Neg liver ROS, GERD  Controlled and Medicated,  Endo/Other  Right Breast Ca Hyperlipidemia  Renal/GU negative Renal ROS  negative genitourinary   Musculoskeletal  (+) Arthritis , Osteoarthritis,    Abdominal   Peds  Hematology  (+) anemia ,   Anesthesia Other Findings   Reproductive/Obstetrics                             Anesthesia Physical  Anesthesia Plan  ASA: 3  Anesthesia Plan: Spinal   Post-op Pain Management:    Induction:   PONV Risk Score and Plan: 2 and Ondansetron, Dexamethasone, Treatment may vary due to age or medical condition, Propofol infusion and Midazolam  Airway Management Planned: Natural Airway and Simple Face Mask  Additional Equipment:   Intra-op Plan:   Post-operative Plan:   Informed Consent:   Plan Discussed with: CRNA and Anesthesiologist  Anesthesia Plan Comments:         Anesthesia Quick Evaluation

## 2021-02-02 NOTE — Care Plan (Signed)
[facility] office RNCM call to patient to discuss her upcoming Right total hip arthroplasty with Dr. Lorin Mercy. She is an Ortho bundle through [facility] and is agreeable to case management. She lives with her husband, and her plan is to return home after a short hospital stay with him assisting her. She has all needed DME (FWW, 3in1/BSC). No further DME needed. Do not anticipate need for HHPT. No referral made. Reviewed all post-op care instructions. Will continue to follow for needs.

## 2021-02-02 NOTE — Telephone Encounter (Signed)
Ortho bundle pre-op call completed. 

## 2021-02-03 ENCOUNTER — Encounter (HOSPITAL_COMMUNITY): Payer: Self-pay | Admitting: Orthopaedic Surgery

## 2021-02-03 ENCOUNTER — Observation Stay (HOSPITAL_COMMUNITY)
Admission: RE | Admit: 2021-02-03 | Discharge: 2021-02-04 | Disposition: A | Discharge status: Home / Self Care | Payer: Medicare Other | Attending: Orthopaedic Surgery | Admitting: Orthopaedic Surgery

## 2021-02-03 ENCOUNTER — Other Ambulatory Visit: Payer: Self-pay

## 2021-02-03 ENCOUNTER — Encounter (HOSPITAL_COMMUNITY)
Admission: RE | Disposition: A | Discharge status: Home / Self Care | Payer: Self-pay | Source: Home / Self Care | Attending: Orthopaedic Surgery

## 2021-02-03 ENCOUNTER — Ambulatory Visit (HOSPITAL_COMMUNITY): Payer: Medicare Other | Admitting: Anesthesiology

## 2021-02-03 ENCOUNTER — Ambulatory Visit (HOSPITAL_COMMUNITY): Payer: Medicare Other

## 2021-02-03 DIAGNOSIS — Z96641 Presence of right artificial hip joint: Secondary | ICD-10-CM | POA: Diagnosis not present

## 2021-02-03 DIAGNOSIS — Z853 Personal history of malignant neoplasm of breast: Secondary | ICD-10-CM | POA: Diagnosis not present

## 2021-02-03 DIAGNOSIS — Z79899 Other long term (current) drug therapy: Secondary | ICD-10-CM | POA: Diagnosis not present

## 2021-02-03 DIAGNOSIS — D696 Thrombocytopenia, unspecified: Secondary | ICD-10-CM | POA: Diagnosis not present

## 2021-02-03 DIAGNOSIS — Z96642 Presence of left artificial hip joint: Secondary | ICD-10-CM | POA: Insufficient documentation

## 2021-02-03 DIAGNOSIS — Z91040 Latex allergy status: Secondary | ICD-10-CM | POA: Insufficient documentation

## 2021-02-03 DIAGNOSIS — Z7982 Long term (current) use of aspirin: Secondary | ICD-10-CM | POA: Diagnosis not present

## 2021-02-03 DIAGNOSIS — M1611 Unilateral primary osteoarthritis, right hip: Secondary | ICD-10-CM | POA: Diagnosis not present

## 2021-02-03 DIAGNOSIS — J45909 Unspecified asthma, uncomplicated: Secondary | ICD-10-CM | POA: Diagnosis not present

## 2021-02-03 DIAGNOSIS — Z419 Encounter for procedure for purposes other than remedying health state, unspecified: Secondary | ICD-10-CM

## 2021-02-03 DIAGNOSIS — E785 Hyperlipidemia, unspecified: Secondary | ICD-10-CM | POA: Diagnosis not present

## 2021-02-03 DIAGNOSIS — K219 Gastro-esophageal reflux disease without esophagitis: Secondary | ICD-10-CM | POA: Diagnosis not present

## 2021-02-03 DIAGNOSIS — Z09 Encounter for follow-up examination after completed treatment for conditions other than malignant neoplasm: Secondary | ICD-10-CM

## 2021-02-03 DIAGNOSIS — Z471 Aftercare following joint replacement surgery: Secondary | ICD-10-CM | POA: Diagnosis not present

## 2021-02-03 HISTORY — PX: TOTAL HIP ARTHROPLASTY: SHX124

## 2021-02-03 LAB — ABO/RH: ABO/RH(D): A POS

## 2021-02-03 SURGERY — ARTHROPLASTY, HIP, TOTAL, ANTERIOR APPROACH
Anesthesia: Spinal | Site: Hip | Laterality: Right

## 2021-02-03 MED ORDER — ACETAMINOPHEN 500 MG PO TABS: 1000.0000 mg | ORAL_TABLET | Freq: Once | ORAL | Status: AC

## 2021-02-03 MED ORDER — ASPIRIN EC 325 MG PO TBEC: 325.0000 mg | DELAYED_RELEASE_TABLET | Freq: Every day | ORAL | 0 refills | Status: DC

## 2021-02-03 MED ORDER — POLYETHYLENE GLYCOL 3350 17 G PO PACK
17.0000 g | PACK | Freq: Every day | ORAL | Status: DC | PRN
Start: 2021-02-03 — End: 2021-02-04

## 2021-02-03 MED ORDER — EPHEDRINE 5 MG/ML INJ
INTRAVENOUS | Status: AC
  Filled 2021-02-03: qty 5

## 2021-02-03 MED ORDER — ORAL CARE MOUTH RINSE: 15.0000 mL | Freq: Once | OROMUCOSAL | Status: AC

## 2021-02-03 MED ORDER — MENTHOL 3 MG MT LOZG: 1.0000 | LOZENGE | OROMUCOSAL | Status: DC | PRN

## 2021-02-03 MED ORDER — PSYLLIUM 95 % PO PACK
1.0000 | PACK | Freq: Every day | ORAL | Status: DC
  Filled 2021-02-03: qty 1

## 2021-02-03 MED ORDER — HYDROMORPHONE HCL 1 MG/ML IJ SOLN
0.5000 mg | INTRAMUSCULAR | Status: DC | PRN
  Administered 2021-02-03: 0.5 mg via INTRAVENOUS
  Filled 2021-02-03: qty 0.5

## 2021-02-03 MED ORDER — ACETAMINOPHEN 500 MG PO TABS
ORAL_TABLET | ORAL | Status: AC
  Administered 2021-02-03: 1000 mg via ORAL
  Filled 2021-02-03: qty 2

## 2021-02-03 MED ORDER — ONDANSETRON HCL 4 MG/2ML IJ SOLN
INTRAMUSCULAR | Status: AC
  Filled 2021-02-03: qty 2

## 2021-02-03 MED ORDER — MIDAZOLAM HCL 5 MG/5ML IJ SOLN
INTRAMUSCULAR | Status: DC | PRN
  Administered 2021-02-03 (×2): 1 mg via INTRAVENOUS

## 2021-02-03 MED ORDER — TAMOXIFEN CITRATE 10 MG PO TABS
20.0000 mg | ORAL_TABLET | Freq: Every day | ORAL | Status: DC
  Administered 2021-02-03: 20 mg via ORAL
  Filled 2021-02-03 (×2): qty 2

## 2021-02-03 MED ORDER — OXYCODONE-ACETAMINOPHEN 5-325 MG PO TABS: 1.0000 | ORAL_TABLET | ORAL | 0 refills | Status: DC | PRN

## 2021-02-03 MED ORDER — CHLORHEXIDINE GLUCONATE 0.12 % MT SOLN
OROMUCOSAL | Status: AC
  Administered 2021-02-03: 15 mL via OROMUCOSAL
  Filled 2021-02-03: qty 15

## 2021-02-03 MED ORDER — PROPOFOL 500 MG/50ML IV EMUL
INTRAVENOUS | Status: DC | PRN
  Administered 2021-02-03: 50 ug/kg/min via INTRAVENOUS

## 2021-02-03 MED ORDER — CEFAZOLIN SODIUM-DEXTROSE 2-4 GM/100ML-% IV SOLN
INTRAVENOUS | Status: AC
  Filled 2021-02-03: qty 100

## 2021-02-03 MED ORDER — METHOCARBAMOL 500 MG PO TABS: 500.0000 mg | ORAL_TABLET | Freq: Four times a day (QID) | ORAL | 0 refills | Status: DC | PRN

## 2021-02-03 MED ORDER — ASPIRIN EC 325 MG PO TBEC
325.0000 mg | DELAYED_RELEASE_TABLET | Freq: Every day | ORAL | Status: DC
  Administered 2021-02-04: 325 mg via ORAL
  Filled 2021-02-03: qty 1

## 2021-02-03 MED ORDER — KETOROLAC TROMETHAMINE 15 MG/ML IJ SOLN
15.0000 mg | Freq: Four times a day (QID) | INTRAMUSCULAR | Status: DC
  Administered 2021-02-03 – 2021-02-04 (×2): 15 mg via INTRAVENOUS
  Filled 2021-02-03 (×2): qty 1

## 2021-02-03 MED ORDER — EPHEDRINE SULFATE 50 MG/ML IJ SOLN
INTRAMUSCULAR | Status: DC | PRN
  Administered 2021-02-03 (×3): 10 mg via INTRAVENOUS

## 2021-02-03 MED ORDER — METOCLOPRAMIDE HCL 5 MG/ML IJ SOLN
5.0000 mg | Freq: Three times a day (TID) | INTRAMUSCULAR | Status: DC | PRN
Start: 2021-02-03 — End: 2021-02-04
  Filled 2021-02-03: qty 2

## 2021-02-03 MED ORDER — SODIUM CHLORIDE 0.9 % IV SOLN: INTRAVENOUS | Status: DC | PRN

## 2021-02-03 MED ORDER — MIDAZOLAM HCL 2 MG/2ML IJ SOLN
INTRAMUSCULAR | Status: AC
  Filled 2021-02-03: qty 2

## 2021-02-03 MED ORDER — VITAMIN D 25 MCG (1000 UNIT) PO TABS
2000.0000 [IU] | ORAL_TABLET | Freq: Every morning | ORAL | Status: DC
  Administered 2021-02-04: 2000 [IU] via ORAL
  Filled 2021-02-03: qty 2

## 2021-02-03 MED ORDER — METHOCARBAMOL 500 MG PO TABS
500.0000 mg | ORAL_TABLET | Freq: Four times a day (QID) | ORAL | Status: DC | PRN
  Administered 2021-02-03 (×2): 500 mg via ORAL
  Filled 2021-02-03 (×2): qty 1

## 2021-02-03 MED ORDER — DEXAMETHASONE SODIUM PHOSPHATE 10 MG/ML IJ SOLN
INTRAMUSCULAR | Status: AC
  Filled 2021-02-03: qty 1

## 2021-02-03 MED ORDER — TRANEXAMIC ACID-NACL 1000-0.7 MG/100ML-% IV SOLN
INTRAVENOUS | Status: DC | PRN
  Administered 2021-02-03: 1000 mg via INTRAVENOUS

## 2021-02-03 MED ORDER — DEXAMETHASONE SODIUM PHOSPHATE 10 MG/ML IJ SOLN
INTRAMUSCULAR | Status: DC | PRN
  Administered 2021-02-03: 5 mg via INTRAVENOUS

## 2021-02-03 MED ORDER — PHENOL 1.4 % MT LIQD: 1.0000 | OROMUCOSAL | Status: DC | PRN

## 2021-02-03 MED ORDER — HYDROMORPHONE HCL 1 MG/ML IJ SOLN
INTRAMUSCULAR | Status: AC
  Filled 2021-02-03: qty 0.5

## 2021-02-03 MED ORDER — OXYCODONE HCL 5 MG PO TABS
5.0000 mg | ORAL_TABLET | ORAL | Status: DC | PRN
  Administered 2021-02-03 (×2): 5 mg via ORAL
  Filled 2021-02-03: qty 1

## 2021-02-03 MED ORDER — DOCUSATE SODIUM 100 MG PO CAPS
100.0000 mg | ORAL_CAPSULE | Freq: Two times a day (BID) | ORAL | Status: DC
  Administered 2021-02-03 (×2): 100 mg via ORAL
  Filled 2021-02-03 (×3): qty 1

## 2021-02-03 MED ORDER — BUPIVACAINE HCL (PF) 0.5 % IJ SOLN
INTRAMUSCULAR | Status: AC
  Filled 2021-02-03: qty 10

## 2021-02-03 MED ORDER — METHOCARBAMOL 1000 MG/10ML IJ SOLN
500.0000 mg | Freq: Four times a day (QID) | INTRAVENOUS | Status: DC | PRN
  Filled 2021-02-03: qty 5

## 2021-02-03 MED ORDER — ACETAMINOPHEN 325 MG PO TABS
325.0000 mg | ORAL_TABLET | Freq: Four times a day (QID) | ORAL | Status: DC | PRN
  Administered 2021-02-03 – 2021-02-04 (×2): 650 mg via ORAL
  Filled 2021-02-03 (×3): qty 2

## 2021-02-03 MED ORDER — BUPIVACAINE-EPINEPHRINE 0.25% -1:200000 IJ SOLN
INTRAMUSCULAR | Status: DC | PRN
  Administered 2021-02-03: 10 mL

## 2021-02-03 MED ORDER — BUPIVACAINE HCL (PF) 0.5 % IJ SOLN
INTRAMUSCULAR | Status: DC | PRN
  Administered 2021-02-03: 2.5 mL via INTRATHECAL

## 2021-02-03 MED ORDER — LIDOCAINE HCL (CARDIAC) PF 100 MG/5ML IV SOSY
PREFILLED_SYRINGE | INTRAVENOUS | Status: DC | PRN
Start: 2021-02-03 — End: 2021-02-03
  Administered 2021-02-03: 50 mg via INTRAVENOUS

## 2021-02-03 MED ORDER — TRANEXAMIC ACID-NACL 1000-0.7 MG/100ML-% IV SOLN
INTRAVENOUS | Status: AC
  Filled 2021-02-03: qty 100

## 2021-02-03 MED ORDER — LIDOCAINE 2% (20 MG/ML) 5 ML SYRINGE
INTRAMUSCULAR | Status: AC
  Filled 2021-02-03: qty 5

## 2021-02-03 MED ORDER — BUPIVACAINE LIPOSOME 1.3 % IJ SUSP
INTRAMUSCULAR | Status: AC
  Filled 2021-02-03: qty 20

## 2021-02-03 MED ORDER — CHLORHEXIDINE GLUCONATE 0.12 % MT SOLN: 15.0000 mL | Freq: Once | OROMUCOSAL | Status: AC

## 2021-02-03 MED ORDER — CEFAZOLIN SODIUM-DEXTROSE 2-4 GM/100ML-% IV SOLN
2.0000 g | INTRAVENOUS | Status: AC
  Administered 2021-02-03: 2 g via INTRAVENOUS

## 2021-02-03 MED ORDER — OXYCODONE HCL 5 MG PO TABS
5.0000 mg | ORAL_TABLET | ORAL | Status: DC | PRN
  Administered 2021-02-03 – 2021-02-04 (×2): 10 mg via ORAL
  Filled 2021-02-03 (×2): qty 2

## 2021-02-03 MED ORDER — PRAVASTATIN SODIUM 40 MG PO TABS
40.0000 mg | ORAL_TABLET | Freq: Every day | ORAL | Status: DC
  Administered 2021-02-03: 40 mg via ORAL
  Filled 2021-02-03: qty 1

## 2021-02-03 MED ORDER — SODIUM CHLORIDE 0.9 % IV SOLN: INTRAVENOUS | Status: DC

## 2021-02-03 MED ORDER — FENTANYL CITRATE (PF) 250 MCG/5ML IJ SOLN
INTRAMUSCULAR | Status: AC
  Filled 2021-02-03: qty 5

## 2021-02-03 MED ORDER — METOCLOPRAMIDE HCL 5 MG PO TABS
5.0000 mg | ORAL_TABLET | Freq: Three times a day (TID) | ORAL | Status: DC | PRN
  Filled 2021-02-03: qty 2

## 2021-02-03 MED ORDER — BUPIVACAINE LIPOSOME 1.3 % IJ SUSP: INTRAMUSCULAR | Status: DC | PRN

## 2021-02-03 MED ORDER — ONDANSETRON HCL 4 MG/2ML IJ SOLN
4.0000 mg | Freq: Four times a day (QID) | INTRAMUSCULAR | Status: DC | PRN
  Filled 2021-02-03: qty 2

## 2021-02-03 MED ORDER — LACTATED RINGERS IV SOLN: INTRAVENOUS | Status: DC

## 2021-02-03 MED ORDER — ONDANSETRON HCL 4 MG PO TABS
4.0000 mg | ORAL_TABLET | Freq: Four times a day (QID) | ORAL | Status: DC | PRN
  Filled 2021-02-03: qty 1

## 2021-02-03 MED ORDER — FENTANYL CITRATE (PF) 100 MCG/2ML IJ SOLN
INTRAMUSCULAR | Status: DC | PRN
  Administered 2021-02-03 (×2): 50 ug via INTRAVENOUS

## 2021-02-03 SURGICAL SUPPLY — 54 items
APL SKNCLS STERI-STRIP NONHPOA (GAUZE/BANDAGES/DRESSINGS) ×1
BAG COUNTER SPONGE SURGICOUNT (BAG) ×2 IMPLANT
BAG SPNG CNTER NS LX DISP (BAG) ×1
BENZOIN TINCTURE PRP APPL 2/3 (GAUZE/BANDAGES/DRESSINGS) ×2 IMPLANT
BLADE CLIPPER SURG (BLADE) IMPLANT
BLADE SAW SGTL 18X1.27X75 (BLADE) ×2 IMPLANT
CELLS DAT CNTRL 66122 CELL SVR (MISCELLANEOUS) ×1 IMPLANT
COVER SURGICAL LIGHT HANDLE (MISCELLANEOUS) ×2 IMPLANT
DRAPE C-ARM 42X72 X-RAY (DRAPES) ×2 IMPLANT
DRAPE IMP U-DRAPE 54X76 (DRAPES) ×2 IMPLANT
DRAPE STERI IOBAN 125X83 (DRAPES) ×2 IMPLANT
DRAPE U-SHAPE 47X51 STRL (DRAPES) ×6 IMPLANT
DRSG MEPILEX BORDER 4X12 (GAUZE/BANDAGES/DRESSINGS) ×2 IMPLANT
DRSG MEPILEX BORDER 4X8 (GAUZE/BANDAGES/DRESSINGS) ×2 IMPLANT
DURAPREP 26ML APPLICATOR (WOUND CARE) ×2 IMPLANT
ELECT BLADE 4.0 EZ CLEAN MEGAD (MISCELLANEOUS)
ELECT CAUTERY BLADE 6.4 (BLADE) ×2 IMPLANT
ELECT REM PT RETURN 9FT ADLT (ELECTROSURGICAL) ×2
ELECTRODE BLDE 4.0 EZ CLN MEGD (MISCELLANEOUS) IMPLANT
ELECTRODE REM PT RTRN 9FT ADLT (ELECTROSURGICAL) ×1 IMPLANT
ELIMINATOR HOLE APEX DEPUY (Hips) ×2 IMPLANT
FACESHIELD WRAPAROUND (MASK) ×4 IMPLANT
GLOVE SRG 8 PF TXTR STRL LF DI (GLOVE) ×2 IMPLANT
GLOVE SURG ORTHO LTX SZ7.5 (GLOVE) ×4 IMPLANT
GLOVE SURG UNDER POLY LF SZ8 (GLOVE) ×4
GOWN STRL REUS W/ TWL LRG LVL3 (GOWN DISPOSABLE) ×1 IMPLANT
GOWN STRL REUS W/ TWL XL LVL3 (GOWN DISPOSABLE) ×1 IMPLANT
GOWN STRL REUS W/TWL 2XL LVL3 (GOWN DISPOSABLE) ×2 IMPLANT
GOWN STRL REUS W/TWL LRG LVL3 (GOWN DISPOSABLE) ×2
GOWN STRL REUS W/TWL XL LVL3 (GOWN DISPOSABLE) ×2
HEAD M SROM 36MM PLUS 1.5 (Hips) ×1 IMPLANT
KIT BASIN OR (CUSTOM PROCEDURE TRAY) ×2 IMPLANT
KIT TURNOVER KIT B (KITS) ×2 IMPLANT
LINER NEUTRAL 52X36MM PLUS 4 (Liner) ×2 IMPLANT
MANIFOLD NEPTUNE II (INSTRUMENTS) ×2 IMPLANT
NS IRRIG 1000ML POUR BTL (IV SOLUTION) ×2 IMPLANT
PACK TOTAL JOINT (CUSTOM PROCEDURE TRAY) ×2 IMPLANT
PAD ARMBOARD 7.5X6 YLW CONV (MISCELLANEOUS) ×4 IMPLANT
PIN SECTOR W/GRIP ACE CUP 52MM (Hips) ×2 IMPLANT
RTRCTR WOUND ALEXIS 18CM MED (MISCELLANEOUS) ×2
SROM M HEAD 36MM PLUS 1.5 (Hips) ×2 IMPLANT
STEM FEMORAL SZ 5MM STD ACTIS (Stem) ×2 IMPLANT
STRIP CLOSURE SKIN 1/2X4 (GAUZE/BANDAGES/DRESSINGS) ×2 IMPLANT
SUT VIC AB 0 CT1 27 (SUTURE) ×2
SUT VIC AB 0 CT1 27XBRD ANBCTR (SUTURE) ×1 IMPLANT
SUT VIC AB 2-0 CT1 27 (SUTURE) ×2
SUT VIC AB 2-0 CT1 TAPERPNT 27 (SUTURE) ×1 IMPLANT
SUT VICRYL 4-0 PS2 18IN ABS (SUTURE) ×2 IMPLANT
SUT VLOC 180 0 24IN GS25 (SUTURE) ×2 IMPLANT
TOWEL GREEN STERILE (TOWEL DISPOSABLE) ×4 IMPLANT
TOWEL GREEN STERILE FF (TOWEL DISPOSABLE) ×2 IMPLANT
TRAY CATH 16FR W/PLASTIC CATH (SET/KITS/TRAYS/PACK) IMPLANT
TRAY FOLEY MTR SLVR 16FR STAT (SET/KITS/TRAYS/PACK) IMPLANT
WATER STERILE IRR 1000ML POUR (IV SOLUTION) ×4 IMPLANT

## 2021-02-03 NOTE — Care Plan (Signed)
Ortho Bundle Case Management Note  Patient Details  Name: Alexis [name] MRN: [id number] Date of Birth: 1951/01/26  [facility] office RNCM call to patient to discuss her upcoming Right total hip arthroplasty with Dr. Lorin Mercy. She is an Ortho bundle through [facility] and is agreeable to case management. She lives with her husband, and her plan is to return home after a short hospital stay with him assisting her. She has all needed DME (FWW, 3in1/BSC). No further DME needed. Do not anticipate need for HHPT. No referral made. Reviewed all post-op care instructions. Will continue to follow for needs.                  DME Arranged:   (Patient already has FWW and 3in1/BSC she reports.) DME Agency:     [facility] Arranged:    [facility] Agency:     Additional Comments: Please contact me with any questions of if this plan should need to change.  Jamse Arn, RN, BSN, [employer]  [phone] 02/03/2021, 4:41 PM

## 2021-02-03 NOTE — Transfer of Care (Signed)
Immediate Anesthesia Transfer of Care Note  Patient: Alexis [name]  Procedure(s) Performed: RIGHT TOTAL HIP ARTHROPLASTY ANTERIOR APPROACH (Right: Hip)  Patient Location: PACU  Anesthesia Type:Spinal  Level of Consciousness: awake, alert , oriented and patient cooperative  Airway & Oxygen Therapy: Patient Spontanous Breathing  Post-op Assessment: Report given to RN and Post -op Vital signs reviewed and stable  Post vital signs: Reviewed and stable  Last Vitals:  Vitals Value Taken Time  BP 109/51 02/03/21 1026  Temp 36.1 C 02/03/21 0926  Pulse 46 02/03/21 1039  Resp 13 02/03/21 1039  SpO2 99 % 02/03/21 1039  Vitals shown include unvalidated device data.  Last Pain:  Vitals:   02/03/21 1026  TempSrc:   PainSc: 0-No pain      Patients Stated Pain Goal: 3 ([id number])  Complications: No notable events documented.

## 2021-02-03 NOTE — Anesthesia Postprocedure Evaluation (Signed)
Anesthesia Post Note  Patient: ANNAELLE VALLONE  Procedure(s) Performed: RIGHT TOTAL HIP ARTHROPLASTY ANTERIOR APPROACH (Right: Hip)     Patient location during evaluation: PACU Anesthesia Type: Spinal Level of consciousness: oriented and awake and alert Pain management: pain level controlled Vital Signs Assessment: post-procedure vital signs reviewed and stable Respiratory status: spontaneous breathing and respiratory function stable Cardiovascular status: blood pressure returned to baseline and stable Postop Assessment: no headache, no backache, no apparent nausea or vomiting and patient able to bend at knees Anesthetic complications: no   No notable events documented.  Last Vitals:  Vitals:   02/03/21 1257 02/03/21 1311  BP: 99/77 111/69  Pulse: (!) 56 63  Resp: 13 13  Temp:    SpO2: 100% 99%    Last Pain:  Vitals:   02/03/21 1211  TempSrc:   PainSc: 0-No pain                 Merlinda Frederick

## 2021-02-03 NOTE — Anesthesia Procedure Notes (Signed)
Spinal  Patient location during procedure: OR Start time: 02/03/2021 7:34 AM End time: 02/03/2021 7:44 AM Reason for block: surgical anesthesia Staffing Performed: anesthesiologist  Anesthesiologist: Merlinda Frederick, MD Preanesthetic Checklist Completed: patient identified, IV checked, risks and benefits discussed, surgical consent, monitors and equipment checked, pre-op evaluation and timeout performed Spinal Block Patient position: sitting Prep: DuraPrep Patient monitoring: cardiac monitor, continuous pulse ox and blood pressure Approach: midline Location: L3-4 Injection technique: single-shot Needle Needle type: Pencan  Needle gauge: 24 G Needle length: 9 cm Assessment Events: CSF return Additional Notes Functioning IV was confirmed and monitors were applied. Sterile prep and drape, including hand hygiene and sterile gloves were used. The patient was positioned and the spine was prepped. The skin was anesthetized with lidocaine.  Free flow of clear CSF was obtained prior to injecting local anesthetic into the CSF.  The spinal needle aspirated freely following injection.  The needle was carefully withdrawn.  The patient tolerated the procedure well.

## 2021-02-03 NOTE — Op Note (Signed)
Preop diagnosis: Right hip primary osteoarthritis  Postop diagnosis: Same  Procedure: Right total hip arthroplasty, direct anterior approach  Surgeon: Lorin Mercy MD  Assistant: Benjiman Core, PA-C medically necessary and present for the entire procedure  EBL less than [id number] cc  Complications: None  Anesthesia: Spinal plus Exparel and Marcaine 10+10 = 20 cc  ImplantsImplants  PIN SECTOR W/GRIP ACE CUP 52MM - [id number]  Inventory Item: PIN SECTOR W/GRIP ACE CUP 52MM Serial no.:  Model/Cat no.: [id number]  Implant name: PIN SECTOR W/GRIP ACE CUP 52MM - [id number] Laterality: Right Area: Hip  Manufacturer: [place] Date of Manufacture:    Action: Implanted Number Used: 1   Device Identifier:  Device Identifier TypeBrock Bad APEX DEPUY - [id number]  Inventory Item: ELIMINATOR HOLE APEX DEPUY Serial no.:  Model/Cat no.: [id number]  Implant nameBrock Bad APEX DEPUY - [id number] Laterality: Right Area: Hip  Manufacturer: [place] Date of Manufacture:    Action: Implanted Number Used: 1   Device Identifier:  Device Identifier Type:     LINER NEUTRAL 52X36MM PLUS 4 - [id number]  Inventory Item: LINER NEUTRAL 52X36MM PLUS 4 Serial no.:  Model/Cat no.: [id number]  Implant name: LINER NEUTRAL 52X36MM PLUS 4 - [id number] Laterality: Right Area: Hip  Manufacturer: [place] Date of Manufacture:    Action: Implanted Number Used: 1   Device Identifier:  Device Identifier Type:     STEM FEMORAL SZ 5MM STD ACTIS [id number]  Inventory Item: STEM FEMORAL SZ 5MM STD ACTIS Serial no.:  Model/Cat no.: [id number]  Implant name: STEM FEMORAL SZ 5MM STD ACTIS [id number] Laterality: Right Area: Hip  Manufacturer: [place] Date of Manufacture:    Action: Implanted Number Used: 1   Device Identifier:  Device Identifier Type:     SROM M HEAD 36MM PLUS 1.5 - [id number]  Inventory Item: SROM M HEAD 36MM PLUS 1.5 Serial no.:  Model/Cat no.: [id number]   Implant name: SROM M HEAD 36MM PLUS 1.5 - [id number] Laterality: Right Area: Hip  Manufacturer: [place] Date of Manufacture:    Action: Implanted Number Used: 1   Device Identifier:  Device Identifier Type:    After induction of spinal anesthesia Foley catheter placement placement on the Hana table with Hana boots C-arm was brought in good visualization of both hips.  1015 drapes were applied DuraPrep the usual total hip sheets drapes large shower curtain Betadine Steri-Drape after sterile skin marker and split sheets and drapes.  Timeout procedure was completed.  Hydraulic arm was prepared and sealed with a strip of Betadine Steri-Drape at the base.  TXA was given by anesthesia preoperative antibiotics were given.  Incision was made 1 fingerbreadth lateral 1 inferior to ASIS obliquely the trochanter fascia was nicked extended dull cobra placed over the top the anterior capsule capsule was opened there is gush of fluid and hypertrophic synovium.  No purulence.  Anterior capsule was resected neck was cut with C-arm visualization 11 mm above the lesser trochanter.  Cut completed with osteotome head was removed with a corkscrew.  Sequential reaming was performed up to 51 from 52.  Patient had a 54 cup on the opposite side.  There is good rim fit and no dome screw was needed.  The apex illuminator was placed tightened down securely +4 neutral liner impacted and then leg was externally rotated [id number] degrees hydraulic arm was placed leg was taken down and under and femur was prepared.  We progressed up to #5 checking  it #3 and it appeared that the larger stem would easily fit with a little bit more lateralization.  #5 stamped that exactly down the middle of the canal looked good on AP and 90 degree rotation lateral image.  +1.5 ball gave good stability and equal leg length.  Trials removed permanent stem inserted +1.5 metal ball was impacted and hip was reduced.  External rotation 90 taken halfway down to  the floor with no subluxation.  Leg was brought back up copious irrigation transverse bleeders were coagulated 1 final time the operative field was dry V-Loc closure for the deep fascia 2 on subtenons tissue skin staple closure postop dressing and transferred to the recovery room.  Patient tolerated procedure well.  Exparel and Marcaine Josie Dixon was 20 cc was injected for postoperative analgesia.  Expected overnight observation as with her opposite hip.  She will follow-up in the evening clinic on a Thursday in 1 to 2 weeks.

## 2021-02-03 NOTE — Evaluation (Signed)
Physical Therapy Evaluation Patient Details Name: Alexis [name] MRN: [id number] DOB: April 26, 1951 Today's Date: 02/03/2021   History of Present Illness  Pt is a 70 y/o female s/p R THA, direct anterior approach, on 8/19. PMH includes L THA, breast cancer, vertigo, and asthma.  Clinical Impression  Pt admitted secondary to problem above with deficits below. Pt requiring min A to stand and take side steps at EOB using RW. Block effects still present and pt with decreased coordination, so further mobility deferred. Anticipate pt will progress well. Will continue to follow acutely.     Follow Up Recommendations Follow surgeon's recommendation for DC plan and follow-up therapies    Equipment Recommendations  None recommended by PT    Recommendations for Other Services       Precautions / Restrictions Precautions Precautions: Fall Restrictions Weight Bearing Restrictions: Yes RLE Weight Bearing: Weight bearing as tolerated      Mobility  Bed Mobility Overal bed mobility: Needs Assistance Bed Mobility: Supine to Sit;Sit to Supine     Supine to sit: Min assist Sit to supine: Min assist   General bed mobility comments: Min A for RLE management.    Transfers Overall transfer level: Needs assistance Equipment used: Rolling walker (2 wheeled) Transfers: Sit to/from Stand Sit to Stand: Min assist         General transfer comment: Min A for steadying assist. Cues for hand placement.  Ambulation/Gait Ambulation/Gait assistance: Min assist   Assistive device: Rolling walker (2 wheeled)       General Gait Details: Only able to take side steps at EOB. Decreased coordination noted when taking steps with RLE secondary to block effects. Also noted some buckling so further mobility deferred.  Stairs            Wheelchair Mobility    Modified Rankin (Stroke Patients Only)       Balance Overall balance assessment: Needs assistance Sitting-balance support: No upper  extremity supported;Feet supported Sitting balance-Leahy Scale: Good     Standing balance support: Bilateral upper extremity supported;During functional activity Standing balance-Leahy Scale: Poor Standing balance comment: Reliant on BUE support                             Pertinent Vitals/Pain Pain Assessment: Faces Faces Pain Scale: Hurts little more Pain Location: R hip Pain Descriptors / Indicators: Aching;Operative site guarding Pain Intervention(s): Monitored during session;Limited activity within patient's tolerance;Repositioned    Home Living Family/patient expects to be discharged to:: Private residence Living Arrangements: Spouse/significant other Available Help at Discharge: Family Type of Home: House Home Access: Stairs to enter Entrance Stairs-Rails: None Entrance Stairs-Number of Steps: 2 Home Layout: Multi-level (tri level) Home Equipment: Environmental consultant - 2 wheels;Walker - standard;Bedside commode      Prior Function Level of Independence: Independent               Hand Dominance        Extremity/Trunk Assessment   Upper Extremity Assessment Upper Extremity Assessment: Overall WFL for tasks assessed    Lower Extremity Assessment Lower Extremity Assessment: RLE deficits/detail RLE Deficits / Details: Deficits consistent with post op pain and weakness.    Cervical / Trunk Assessment Cervical / Trunk Assessment: Normal  Communication   Communication: No difficulties  Cognition Arousal/Alertness: Awake/alert Behavior During Therapy: WFL for tasks assessed/performed Overall Cognitive Status: Within Functional Limits for tasks assessed  General Comments General comments (skin integrity, edema, etc.): Pt's husband present during session    Exercises Total Joint Exercises Ankle Circles/Pumps: AROM;Both;10 reps;Supine   Assessment/Plan    PT Assessment Patient needs continued PT  services  PT Problem List Decreased range of motion;Decreased strength;Decreased balance;Decreased activity tolerance;Decreased mobility;Decreased knowledge of use of DME;Decreased knowledge of precautions;Pain;Impaired sensation       PT Treatment Interventions DME instruction;Gait training;Stair training;Functional mobility training;Therapeutic activities;Therapeutic exercise;Balance training;Patient/family education    PT Goals (Current goals can be found in the Care Plan section)  Acute Rehab PT Goals Patient Stated Goal: to go home PT Goal Formulation: With patient/family Time For Goal Achievement: 02/17/21 Potential to Achieve Goals: Good    Frequency 7X/week   Barriers to discharge        Co-evaluation               AM-PAC PT "6 Clicks" Mobility  Outcome Measure Help needed turning from your back to your side while in a flat bed without using bedrails?: A Little Help needed moving from lying on your back to sitting on the side of a flat bed without using bedrails?: A Little Help needed moving to and from a bed to a chair (including a wheelchair)?: A Little Help needed standing up from a chair using your arms (e.g., wheelchair or bedside chair)?: A Little Help needed to walk in hospital room?: A Little Help needed climbing 3-5 steps with a railing? : A Lot 6 Click Score: 17    End of Session   Activity Tolerance: Patient tolerated treatment well Patient left: in bed;with call bell/phone within reach;with family/visitor present (in bed in PACU) Nurse Communication: Mobility status PT Visit Diagnosis: Other abnormalities of gait and mobility (R26.89);Pain Pain - Right/Left: Right Pain - part of body: Hip    Time: 1335-1355 PT Time Calculation (min) (ACUTE ONLY): 20 min   Charges:   PT Evaluation $PT Eval Low Complexity: 1 Low          Lou Miner, DPT  Acute Rehabilitation Services  Pager: [phone] Office: [phone]   Rudean Hitt 02/03/2021, 2:54 PM

## 2021-02-03 NOTE — Discharge Instructions (Addendum)
INSTRUCTIONS AFTER JOINT REPLACEMENT   Remove items at home which could result in a fall. This includes throw rugs or furniture in walking pathways ICE to the affected joint every three hours while awake for 30 minutes at a time, for at least the first 3-5 days, and then as needed for pain and swelling.  Continue to use ice for pain and swelling. You may notice swelling that will progress down to the foot and ankle.  This is normal after surgery.  Elevate your leg when you are not up walking on it.   Continue to use the breathing machine you got in the hospital (incentive spirometer) which will help keep your temperature down.  It is common for your temperature to cycle up and down following surgery, especially at night when you are not up moving around and exerting yourself.  The breathing machine keeps your lungs expanded and your temperature down.   DIET:  As you were doing prior to hospitalization, we recommend a well-balanced diet.  DRESSING / WOUND CARE / SHOWERING  You may change your dressing 3-5 days after surgery.  Then change the dressing every day with sterile gauze.  Please use good hand washing techniques before changing the dressing.  Do not use any lotions or creams on the incision until instructed by your surgeon.  ACTIVITY  Increase activity slowly as tolerated, but follow the weight bearing instructions below.   No driving for 6 weeks or until further direction given by your physician.  You cannot drive while taking narcotics.  No lifting or carrying greater than 10 lbs. until further directed by your surgeon. Avoid periods of inactivity such as sitting longer than an hour when not asleep. This helps prevent blood clots.  You may return to work once you are authorized by your doctor.     WEIGHT BEARING   Weight bearing as tolerated with assist device (walker, cane, etc) as directed, use it as long as suggested by your surgeon or therapist, typically at least 4-6  weeks.   EXERCISES  Results after joint replacement surgery are often greatly improved when you follow the exercise, range of motion and muscle strengthening exercises prescribed by your doctor. Safety measures are also important to protect the joint from further injury. Any time any of these exercises cause you to have increased pain or swelling, decrease what you are doing until you are comfortable again and then slowly increase them. If you have problems or questions, call your caregiver or physical therapist for advice.   Rehabilitation is important following a joint replacement. After just a few days of immobilization, the muscles of the leg can become weakened and shrink (atrophy).  These exercises are designed to build up the tone and strength of the thigh and leg muscles and to improve motion. Often times heat used for twenty to thirty minutes before working out will loosen up your tissues and help with improving the range of motion but do not use heat for the first two weeks following surgery (sometimes heat can increase post-operative swelling).   These exercises can be done on a training (exercise) mat, on the floor, on a table or on a bed. Use whatever works the best and is most comfortable for you.    Use music or television while you are exercising so that the exercises are a pleasant break in your day. This will make your life better with the exercises acting as a break in your routine that you can look forward   to.   Perform all exercises about fifteen times, three times per day or as directed.  You should exercise both the operative leg and the other leg as well.  CONSTIPATION  Constipation is defined medically as fewer than three stools per week and severe constipation as less than one stool per week.  Even if you have a regular bowel pattern at home, your normal regimen is likely to be disrupted due to multiple reasons following surgery.  Combination of anesthesia, postoperative  narcotics, change in appetite and fluid intake all can affect your bowels.   YOU MUST use at least one of the following options; they are listed in order of increasing strength to get the job done.  They are all available over the counter, and you may need to use some, POSSIBLY even all of these options:    Drink plenty of fluids (prune juice may be helpful) and high fiber foods Colace 100 mg by mouth twice a day  Senokot for constipation as directed and as needed Dulcolax (bisacodyl), take with full glass of water  Miralax (polyethylene glycol) once or twice a day as needed.  If you have tried all these things and are unable to have a bowel movement in the first 3-4 days after surgery call either your surgeon or your primary doctor.    If you experience loose stools or diarrhea, hold the medications until you stool forms back up.  If your symptoms do not get better within 1 week or if they get worse, check with your doctor.  If you experience "the worst abdominal pain ever" or develop nausea or vomiting, please contact the office immediately for further recommendations for treatment.   ITCHING:  If you experience itching with your medications, try taking only a single pain pill, or even half a pain pill at a time.  You can also use Benadryl over the counter for itching or also to help with sleep.   TED HOSE STOCKINGS:  Use stockings on both legs until for at least 2 weeks or as directed by physician office. They may be removed at night for sleeping.  MEDICATIONS:  See your medication summary on the "After Visit Summary" that nursing will review with you.  You may have some home medications which will be placed on hold until you complete the course of blood thinner medication.  It is important for you to complete the blood thinner medication as prescribed.  PRECAUTIONS:  If you experience chest pain or shortness of breath - call 911 immediately for transfer to the hospital emergency department.    If you develop a fever greater that 101 F, purulent drainage from wound, increased redness or drainage from wound, foul odor from the wound/dressing, or calf pain - CONTACT YOUR SURGEON.                                                   FOLLOW-UP APPOINTMENTS:  If you do not already have a post-op appointment, please call the office for an appointment to be seen by your surgeon.  Guidelines for how soon to be seen are listed in your "After Visit Summary", but are typically between 1-4 weeks after surgery.  OTHER INSTRUCTIONS:   Knee Replacement:  Do not place pillow under knee, focus on keeping the knee straight while resting. CPM instructions: 0-90 degrees, 2  hours in the morning, 2 hours in the afternoon, and 2 hours in the evening. Place foam block, curve side up under heel at all times except when in CPM or when walking.  DO NOT modify, tear, cut, or change the foam block in any way.  POST-OPERATIVE OPIOID TAPER INSTRUCTIONS: It is important to wean off of your opioid medication as soon as possible. If you do not need pain medication after your surgery it is ok to stop day one. Opioids include: Codeine, Hydrocodone(Norco, Vicodin), Oxycodone(Percocet, oxycontin) and hydromorphone amongst others.  Long term and even short term use of opiods can cause: Increased pain response Dependence Constipation Depression Respiratory depression And more.  Withdrawal symptoms can include Flu like symptoms Nausea, vomiting And more Techniques to manage these symptoms Hydrate well Eat regular healthy meals Stay active Use relaxation techniques(deep breathing, meditating, yoga) Do Not substitute Alcohol to help with tapering If you have been on opioids for less than two weeks and do not have pain than it is ok to stop all together.  Plan to wean off of opioids This plan should start within one week post op of your joint replacement. Maintain the same interval or time between taking each dose  and first decrease the dose.  Cut the total daily intake of opioids by one tablet each day Next start to increase the time between doses. The last dose that should be eliminated is the evening dose.   MAKE SURE YOU:  Understand these instructions.  Get help right away if you are not doing well or get worse.    Thank you for letting us be a part of your medical care team.  It is a privilege we respect greatly.  We hope these instructions will help you stay on track for a fast and full recovery!     Dental Antibiotics:  In most cases prophylactic antibiotics for Dental procdeures after total joint surgery are not necessary.  Exceptions are as follows:  1. History of prior total joint infection  2. Severely immunocompromised (Organ Transplant, cancer chemotherapy, Rheumatoid biologic meds such as Pepin)  3. Poorly controlled diabetes (A1C &gt; 8.0, blood glucose over 200)  If you have one of these conditions, contact your surgeon for an antibiotic prescription, prior to your dental procedure.

## 2021-02-03 NOTE — Interval H&P Note (Signed)
History and Physical Interval Note:  02/03/2021 7:23 AM  Alexis [name]  has presented today for surgery, with the diagnosis of right hip osteoarthritis.  The various methods of treatment have been discussed with the patient and family. After consideration of risks, benefits and other options for treatment, the patient has consented to  Procedure(s): RIGHT TOTAL HIP ARTHROPLASTY ANTERIOR APPROACH (Right) as a surgical intervention.  The patient's history has been reviewed, patient examined, no change in status, stable for surgery.  I have reviewed the patient's chart and labs.  Questions were answered to the patient's satisfaction.     [name]

## 2021-02-04 DIAGNOSIS — Z96642 Presence of left artificial hip joint: Secondary | ICD-10-CM | POA: Diagnosis not present

## 2021-02-04 DIAGNOSIS — J45909 Unspecified asthma, uncomplicated: Secondary | ICD-10-CM | POA: Diagnosis not present

## 2021-02-04 DIAGNOSIS — M1611 Unilateral primary osteoarthritis, right hip: Secondary | ICD-10-CM | POA: Diagnosis not present

## 2021-02-04 DIAGNOSIS — Z7982 Long term (current) use of aspirin: Secondary | ICD-10-CM | POA: Diagnosis not present

## 2021-02-04 DIAGNOSIS — Z79899 Other long term (current) drug therapy: Secondary | ICD-10-CM | POA: Diagnosis not present

## 2021-02-04 DIAGNOSIS — Z853 Personal history of malignant neoplasm of breast: Secondary | ICD-10-CM | POA: Diagnosis not present

## 2021-02-04 LAB — CBC
HCT: 29.5 % — ABNORMAL LOW (ref 36.0–46.0)
Hemoglobin: 9.7 g/dL — ABNORMAL LOW (ref 12.0–15.0)
MCH: 29.6 pg (ref 26.0–34.0)
MCHC: 32.9 g/dL (ref 30.0–36.0)
MCV: 89.9 fL (ref 80.0–100.0)
Platelets: 135 10*3/uL — ABNORMAL LOW (ref 150–400)
RBC: 3.28 MIL/uL — ABNORMAL LOW (ref 3.87–5.11)
RDW: 13.4 % (ref 11.5–15.5)
WBC: 8.5 10*3/uL (ref 4.0–10.5)
nRBC: 0 % (ref 0.0–0.2)

## 2021-02-04 LAB — BASIC METABOLIC PANEL
Anion gap: 8 (ref 5–15)
BUN: 8 mg/dL (ref 8–23)
CO2: 25 mmol/L (ref 22–32)
Calcium: 8.1 mg/dL — ABNORMAL LOW (ref 8.9–10.3)
Chloride: 99 mmol/L (ref 98–111)
Creatinine, Ser: 0.65 mg/dL (ref 0.44–1.00)
GFR, Estimated: >60 mL/min (ref >60)
Glucose, Bld: 149 mg/dL — ABNORMAL HIGH (ref 70–99)
Potassium: 3.8 mmol/L (ref 3.5–5.1)
Sodium: 132 mmol/L — ABNORMAL LOW (ref 135–145)

## 2021-02-04 NOTE — Progress Notes (Signed)
Patient was transported via wheelchair by NT for discharge home; in no acute distress nor complaints of pain nor discomfort; room was checked and accounted for all her belongings; discharge instructions given to patient by RN and she verbalized understanding on the instructions given.

## 2021-02-04 NOTE — Progress Notes (Signed)
Subjective: 1 Day Post-Op Procedure(s) (LRB): RIGHT TOTAL HIP ARTHROPLASTY ANTERIOR APPROACH (Right) Patient reports pain as mild and moderate.    Objective: Vital signs in last 24 hours: Temp:  [97 F (36.1 C)-98.1 F (36.7 C)] 98 F (36.7 C) (08/20 0350) Pulse Rate:  [46-91] 91 (08/20 0350) Resp:  [10-18] 18 (08/20 0350) BP: (97-160)/(45-80) 160/64 (08/20 0350) SpO2:  [93 %-100 %] 100 % (08/20 0350)  Intake/Output from previous day: 08/19 0701 - 08/20 0700 In: 1960 [P.O.:240; I.V.:1000; IV Piggyback:200] Out: 1450 [Urine:1350; Blood:100] Intake/Output this shift: No intake/output data recorded.  Recent Labs    02/04/21 0511  HGB 9.7*   Recent Labs    02/04/21 0511  WBC 8.5  RBC 3.28*  HCT 29.5*  PLT 135*   Recent Labs    02/04/21 0511  NA 132*  K 3.8  CL 99  CO2 25  BUN 8  CREATININE 0.65  GLUCOSE 149*  CALCIUM 8.1*   No results for input(s): LABPT, INR in the last 72 hours.  Neurologically intact DG C-Arm 1-60 Min  Result Date: 02/03/2021 CLINICAL DATA:  Surgery, elective Z41.9 (ICD-10-CM). Additional history provided by technologist: Right total hip arthroplasty anterior approach. Provided fluoroscopy time 15 seconds (1.21 mGy). EXAM: OPERATIVE right HIP (WITH PELVIS IF PERFORMED) 5 VIEWS TECHNIQUE: Fluoroscopic spot image(s) were submitted for interpretation post-operatively. COMPARISON:  Radiographs of the right hip 01/21/2020. FINDINGS: Five intraoperative fluoroscopic images of the right hip are submitted. On the provided images, there are findings of interval right total hip arthroplasty. The femoral and acetabular components appear well seated. No unexpected finding on the provided views. Redemonstrated sequela of prior left total hip arthroplasty. IMPRESSION: Five intraoperative fluoroscopic images of the right hip from right total hip arthroplasty. Electronically Signed   By: Kellie Simmering D.O.   On: 02/03/2021 10:05   DG Hip Port Unilat With  Pelvis 1V Right  Result Date: 02/03/2021 CLINICAL DATA:  Postop check Z09 (ICD-10-CM). EXAM: DG HIP (WITH OR WITHOUT PELVIS) 1V PORT RIGHT COMPARISON:  Intraoperative fluoroscopic images of the right hip performed 02/03/2021. FINDINGS: Right total hip arthroplasty performed earlier today with overlying skin staples. The femoral and acetabular components appear well seated. No unexpected finding. Redemonstrated sequela of prior left total hip arthroplasty. No acute bony abnormality identified. Degenerative changes of the visualized lumbar spine. IMPRESSION: Right total hip arthroplasty performed earlier today. The femoral and acetabular components appear well seated. No unexpected finding. Redemonstrated sequela of prior left total hip arthroplasty. No acute bony abnormality identified. Electronically Signed   By: Kellie Simmering D.O.   On: 02/03/2021 10:08   DG HIP OPERATIVE UNILAT WITH PELVIS RIGHT  Result Date: 02/03/2021 CLINICAL DATA:  Surgery, elective Z41.9 (ICD-10-CM). Additional history provided by technologist: Right total hip arthroplasty anterior approach. Provided fluoroscopy time 15 seconds (1.21 mGy). EXAM: OPERATIVE right HIP (WITH PELVIS IF PERFORMED) 5 VIEWS TECHNIQUE: Fluoroscopic spot image(s) were submitted for interpretation post-operatively. COMPARISON:  Radiographs of the right hip 01/21/2020. FINDINGS: Five intraoperative fluoroscopic images of the right hip are submitted. On the provided images, there are findings of interval right total hip arthroplasty. The femoral and acetabular components appear well seated. No unexpected finding on the provided views. Redemonstrated sequela of prior left total hip arthroplasty. IMPRESSION: Five intraoperative fluoroscopic images of the right hip from right total hip arthroplasty. Electronically Signed   By: Kellie Simmering D.O.   On: 02/03/2021 10:05    Assessment/Plan: 1 Day Post-Op Procedure(s) (LRB): RIGHT TOTAL HIP  ARTHROPLASTY ANTERIOR APPROACH  (Right) Plan: discharge home after PT. Office about one week  Alexis [name] 02/04/2021, 7:12 AM

## 2021-02-04 NOTE — TOC Transition Note (Signed)
Transition of Care [facility]) - CM/SW Discharge Note   Patient Details  Name: IVADELL SARRATT MRN: [id number] Date of Birth: 14-Sep-1950  Transition of Care [facility] CM/SW Contact:  Carles Collet, RN Phone Number: 02/04/2021, 11:35 AM   Clinical Narrative:    Notified by nurse that patient will need HH, and that she is agreeable to [facility] services.  Unable to meet with patient prior to DC. Patient is a bundle w/o HH assigned. Per RN office uses [facility]. Referral made and accepted by [facility]. DME has been delivered to the house.  No other CM needs identified.     Final next level of care: Home w Home Health Services Barriers to Discharge: No Barriers Identified   Patient Goals and CMS Choice        Discharge Placement                       Discharge Plan and Services                DME Arranged:  (Patient already has [facility] and 3in1/[facility] she reports.)         HH Arranged: PT HH Agency: [place] Date [place]: 02/04/21 Time [place]: 1134 Representative spoke with at [place]: [place] ([place]) Interventions     Readmission Risk Interventions No flowsheet data found.

## 2021-02-04 NOTE — Progress Notes (Signed)
Physical Therapy Treatment Patient Details Name: Alexis [name] MRN: [id number] DOB: 06-06-1951 Today's Date: 02/04/2021    History of Present Illness Pt is a 70 y/o female s/p R THA, direct anterior approach, on 8/19. PMH includes L THA, breast cancer, vertigo, and asthma.    PT Comments    Pt progressing towards physical therapy goals. Was able to perform transfers and ambulation with up to modified independence, however requires frequent cues for improved gait pattern and posture throughout OOB mobility. Pt would benefit from HHPT to continue optimizing gait pattern, progress mobility and decrease risk for falls. Will continue to follow.     Follow Up Recommendations  Follow surgeon's recommendation for DC plan and follow-up therapies     Equipment Recommendations  None recommended by PT    Recommendations for Other Services       Precautions / Restrictions Precautions Precautions: Fall Restrictions Weight Bearing Restrictions: Yes RLE Weight Bearing: Weight bearing as tolerated    Mobility  Bed Mobility Overal bed mobility: Modified Independent Bed Mobility: Supine to Sit;Sit to Supine           General bed mobility comments: Increased time, no assist required.    Transfers Overall transfer level: Needs assistance Equipment used: Rolling walker (2 wheeled) Transfers: Sit to/from Stand Sit to Stand: Supervision         General transfer comment: VC's for hand placement on seated surface for safety.  Ambulation/Gait Ambulation/Gait assistance: Supervision Gait Distance (Feet): 400 Feet Assistive device: Rolling walker (2 wheeled) Gait Pattern/deviations: Step-through pattern;Decreased stride length;Trunk flexed;Decreased dorsiflexion - right;Decreased dorsiflexion - left;Decreased stance time - right Gait velocity: Decreased Gait velocity interpretation: 1.31 - 2.62 ft/sec, indicative of limited community ambulator General Gait Details: Pt rushing through  gait cycle and requires cues for even step/stride length, increased heel strike, closer walker proximity, and improved posture.   Stairs Stairs: Yes Stairs assistance: Min guard Stair Management: One rail Left;Step to pattern;Forwards Number of Stairs: 10 General stair comments: VC's for sequencing and general safety.   Wheelchair Mobility    Modified Rankin (Stroke Patients Only)       Balance Overall balance assessment: Needs assistance Sitting-balance support: No upper extremity supported;Feet supported Sitting balance-Leahy Scale: Good     Standing balance support: Bilateral upper extremity supported;During functional activity Standing balance-Leahy Scale: Poor Standing balance comment: Reliant on BUE support                            Cognition Arousal/Alertness: Awake/alert Behavior During Therapy: WFL for tasks assessed/performed Overall Cognitive Status: Within Functional Limits for tasks assessed                                        Exercises Total Joint Exercises Ankle Circles/Pumps: 10 reps Quad Sets: 10 reps Heel Slides: 10 reps Hip ABduction/ADduction: 10 reps Long Arc Quad: 10 reps    General Comments        Pertinent Vitals/Pain Pain Assessment: Faces Faces Pain Scale: Hurts a little bit Pain Location: R quad Pain Descriptors / Indicators: Operative site guarding;Sore Pain Intervention(s): Limited activity within patient's tolerance;Monitored during session;Repositioned    Home Living                      Prior Function  PT Goals (current goals can now be found in the care plan section) Acute Rehab PT Goals Patient Stated Goal: to go home PT Goal Formulation: With patient/family Time For Goal Achievement: 02/17/21 Potential to Achieve Goals: Good Progress towards PT goals: Progressing toward goals    Frequency    7X/week      PT Plan Current plan remains appropriate     Co-evaluation              AM-PAC PT "6 Clicks" Mobility   Outcome Measure  Help needed turning from your back to your side while in a flat bed without using bedrails?: A Little Help needed moving from lying on your back to sitting on the side of a flat bed without using bedrails?: A Little Help needed moving to and from a bed to a chair (including a wheelchair)?: A Little Help needed standing up from a chair using your arms (e.g., wheelchair or bedside chair)?: A Little Help needed to walk in hospital room?: A Little Help needed climbing 3-5 steps with a railing? : A Lot 6 Click Score: 17    End of Session Equipment Utilized During Treatment: Gait belt Activity Tolerance: Patient tolerated treatment well Patient left: in bed;with call bell/phone within reach;with family/visitor present (in bed in PACU) Nurse Communication: Mobility status PT Visit Diagnosis: Other abnormalities of gait and mobility (R26.89);Pain Pain - Right/Left: Right Pain - part of body: Hip     Time: [id number] PT Time Calculation (min) (ACUTE ONLY): 33 min  Charges:  $Gait Training: 8-22 mins $Therapeutic Exercise: 8-22 mins                     Rolinda Roan, PT, DPT Acute Rehabilitation Services Pager: [phone] Office: [place] 02/04/2021, 10:14 AM

## 2021-02-06 ENCOUNTER — Telehealth: Payer: Self-pay | Admitting: *Deleted

## 2021-02-06 ENCOUNTER — Encounter (HOSPITAL_COMMUNITY): Payer: Self-pay | Admitting: Orthopaedic Surgery

## 2021-02-06 NOTE — Telephone Encounter (Signed)
Ortho bundle D/C call completed. 

## 2021-02-07 ENCOUNTER — Emergency Department (HOSPITAL_COMMUNITY)
Admission: EM | Admit: 2021-02-07 | Discharge: 2021-02-07 | Disposition: A | Discharge status: Home / Self Care | Payer: Medicare Other | Attending: Emergency Medicine | Admitting: Emergency Medicine

## 2021-02-07 ENCOUNTER — Encounter (HOSPITAL_COMMUNITY): Payer: Self-pay | Admitting: *Deleted

## 2021-02-07 ENCOUNTER — Other Ambulatory Visit: Payer: Self-pay

## 2021-02-07 DIAGNOSIS — Z96643 Presence of artificial hip joint, bilateral: Secondary | ICD-10-CM | POA: Insufficient documentation

## 2021-02-07 DIAGNOSIS — Z91040 Latex allergy status: Secondary | ICD-10-CM | POA: Insufficient documentation

## 2021-02-07 DIAGNOSIS — M79604 Pain in right leg: Secondary | ICD-10-CM | POA: Diagnosis not present

## 2021-02-07 DIAGNOSIS — Z7982 Long term (current) use of aspirin: Secondary | ICD-10-CM | POA: Insufficient documentation

## 2021-02-07 DIAGNOSIS — Z853 Personal history of malignant neoplasm of breast: Secondary | ICD-10-CM | POA: Insufficient documentation

## 2021-02-07 DIAGNOSIS — J45909 Unspecified asthma, uncomplicated: Secondary | ICD-10-CM | POA: Diagnosis not present

## 2021-02-07 LAB — BASIC METABOLIC PANEL
Anion gap: 8 (ref 5–15)
BUN: 13 mg/dL (ref 8–23)
CO2: 28 mmol/L (ref 22–32)
Calcium: 8.3 mg/dL — ABNORMAL LOW (ref 8.9–10.3)
Chloride: 102 mmol/L (ref 98–111)
Creatinine, Ser: 0.59 mg/dL (ref 0.44–1.00)
GFR, Estimated: >60 mL/min (ref >60)
Glucose, Bld: 105 mg/dL — ABNORMAL HIGH (ref 70–99)
Potassium: 3.1 mmol/L — ABNORMAL LOW (ref 3.5–5.1)
Sodium: 138 mmol/L (ref 135–145)

## 2021-02-07 LAB — CBC WITH DIFFERENTIAL/PLATELET
Abs Immature Granulocytes: 0.02 10*3/uL (ref 0.00–0.07)
Basophils Absolute: 0 10*3/uL (ref 0.0–0.1)
Basophils Relative: 1 %
Eosinophils Absolute: 0.2 10*3/uL (ref 0.0–0.5)
Eosinophils Relative: 3 %
HCT: 29.4 % — ABNORMAL LOW (ref 36.0–46.0)
Hemoglobin: 9.5 g/dL — ABNORMAL LOW (ref 12.0–15.0)
Immature Granulocytes: 0 %
Lymphocytes Relative: 18 %
Lymphs Abs: 1 10*3/uL (ref 0.7–4.0)
MCH: 29.5 pg (ref 26.0–34.0)
MCHC: 32.3 g/dL (ref 30.0–36.0)
MCV: 91.3 fL (ref 80.0–100.0)
Monocytes Absolute: 0.7 10*3/uL (ref 0.1–1.0)
Monocytes Relative: 12 %
Neutro Abs: 3.8 10*3/uL (ref 1.7–7.7)
Neutrophils Relative %: 66 %
Platelets: 181 10*3/uL (ref 150–400)
RBC: 3.22 MIL/uL — ABNORMAL LOW (ref 3.87–5.11)
RDW: 13.2 % (ref 11.5–15.5)
WBC: 5.6 10*3/uL (ref 4.0–10.5)
nRBC: 0 % (ref 0.0–0.2)

## 2021-02-07 MED ORDER — APIXABAN 5 MG PO TABS
10.0000 mg | ORAL_TABLET | Freq: Once | ORAL | Status: AC
  Administered 2021-02-07: 10 mg via ORAL
  Filled 2021-02-07: qty 2

## 2021-02-07 NOTE — Discharge Summary (Signed)
Patient ID: Alexis [name] MRN: [id number] DOB/AGE: 09-14-1950 70 y.o.  Admit date: 02/03/2021 Discharge date: 02/04/2021  Admission Diagnoses:  Active Problems:   Arthritis of right hip   Discharge Diagnoses:  Active Problems:   Arthritis of right hip  status post Procedure(s): RIGHT TOTAL HIP ARTHROPLASTY ANTERIOR APPROACH  Past Medical History:  Diagnosis Date   Allergy    Arthritis    bilateral hips   Asthma    environmental now resolved   Breast cancer ([place]) 11/16/2015   Right Breast   Chronic fatigue    Cough    Family history of breast cancer    Family history of lung cancer    GERD (gastroesophageal reflux disease)    Heart murmur    "years ago, when I was young"   Hemangioma    History of hiatal hernia    Hyperlipidemia    IBS (irritable bowel syndrome)    Iron deficiency anemia    Kyphosis of thoracic region    MVP (mitral valve prolapse)    Osteopenia    Personal history of radiation therapy    SAH (subarachnoid hemorrhage) ([place])    Vertigo     Surgeries: Procedure(s): RIGHT TOTAL HIP ARTHROPLASTY ANTERIOR APPROACH on 02/03/2021   Consultants:   Discharged Condition: Improved  Hospital Course: Alexis [name] is an 70 y.o. female who was admitted 02/03/2021 for operative treatment of right hip djd. Patient failed conservative treatments (please see the history and physical for the specifics) and had severe unremitting pain that affects sleep, daily activities and work/hobbies. After pre-op clearance, the patient was taken to the operating room on 02/03/2021 and underwent  Procedure(s): RIGHT TOTAL HIP ARTHROPLASTY ANTERIOR APPROACH.    Patient was given perioperative antibiotics:  Anti-infectives (From admission, onward)    Start     Dose/Rate Route Frequency Ordered Stop   02/03/21 0600  ceFAZolin (ANCEF) IVPB 2g/100 mL premix        2 g 200 mL/hr over 30 Minutes Intravenous On call to O.R. 02/03/21 0550 02/03/21 0802   02/03/21 0555  ceFAZolin  (ANCEF) 2-4 GM/100ML-% IVPB       Note to Pharmacy: Rocky Morel   : cabinet override      02/03/21 0555 02/03/21 0838        Patient was given sequential compression devices and early ambulation to prevent DVT.   Patient benefited maximally from hospital stay and there were no complications. At the time of discharge, the patient was urinating/moving their bowels without difficulty, tolerating a regular diet, pain is controlled with oral pain medications and they have been cleared by PT/OT.   Recent vital signs: No data found.   Recent laboratory studies: No results for input(s): WBC, HGB, HCT, PLT, NA, K, CL, CO2, BUN, CREATININE, GLUCOSE, INR, CALCIUM in the last 72 hours.  Invalid input(s): PT, 2   Discharge Medications:   Allergies as of 02/04/2021       Reactions   Other Itching   Jell-o   Latex Rash   Contact rash        Medication List     TAKE these medications    aspirin EC 325 MG tablet Take 1 tablet (325 mg total) by mouth daily. MUST TAKE AT LEAST 4 WEEKS POSTOP FOR DVT PROPHYLAXIS. What changed:  medication strength how much to take when to take this additional instructions   chlorpheniramine 4 MG tablet Commonly known as: CHLOR-TRIMETON Take 4 mg by mouth at bedtime.  docusate sodium 100 MG capsule Commonly known as: COLACE Take 100 mg by mouth in the morning and at bedtime.   methocarbamol 500 MG tablet Commonly known as: Robaxin Take 1 tablet (500 mg total) by mouth every 6 (six) hours as needed for muscle spasms.   oxyCODONE-acetaminophen 5-325 MG tablet Commonly known as: PERCOCET/ROXICET Take 1 tablet by mouth every 4 (four) hours as needed for severe pain.   pravastatin 40 MG tablet Commonly known as: PRAVACHOL TAKE 1 TABLET BY MOUTH EVERY DAY IN THE EVENING   psyllium 0.52 g capsule Commonly known as: REGULOID Take 0.52 g by mouth in the morning and at bedtime.   SUPER B COMPLEX/VITAMIN C PO Take 1 tablet by mouth every  evening.   tamoxifen 20 MG tablet Commonly known as: NOLVADEX TAKE 1 TABLET BY MOUTH EVERYDAY AT BEDTIME   Vitamin D3 50 MCG (2000 UT) Tabs Take 2,000 Units by mouth in the morning.        Diagnostic Studies: DG C-Arm 1-60 Min  Result Date: 02/03/2021 CLINICAL DATA:  Surgery, elective Z41.9 (ICD-10-CM). Additional history provided by technologist: Right total hip arthroplasty anterior approach. Provided fluoroscopy time 15 seconds (1.21 mGy). EXAM: OPERATIVE right HIP (WITH PELVIS IF PERFORMED) 5 VIEWS TECHNIQUE: Fluoroscopic spot image(s) were submitted for interpretation post-operatively. COMPARISON:  Radiographs of the right hip 01/21/2020. FINDINGS: Five intraoperative fluoroscopic images of the right hip are submitted. On the provided images, there are findings of interval right total hip arthroplasty. The femoral and acetabular components appear well seated. No unexpected finding on the provided views. Redemonstrated sequela of prior left total hip arthroplasty. IMPRESSION: Five intraoperative fluoroscopic images of the right hip from right total hip arthroplasty. Electronically Signed   By: Kellie Simmering D.O.   On: 02/03/2021 10:05   DG Hip Port Unilat With Pelvis 1V Right  Result Date: 02/03/2021 CLINICAL DATA:  Postop check Z09 (ICD-10-CM). EXAM: DG HIP (WITH OR WITHOUT PELVIS) 1V PORT RIGHT COMPARISON:  Intraoperative fluoroscopic images of the right hip performed 02/03/2021. FINDINGS: Right total hip arthroplasty performed earlier today with overlying skin staples. The femoral and acetabular components appear well seated. No unexpected finding. Redemonstrated sequela of prior left total hip arthroplasty. No acute bony abnormality identified. Degenerative changes of the visualized lumbar spine. IMPRESSION: Right total hip arthroplasty performed earlier today. The femoral and acetabular components appear well seated. No unexpected finding. Redemonstrated sequela of prior left total hip  arthroplasty. No acute bony abnormality identified. Electronically Signed   By: Kellie Simmering D.O.   On: 02/03/2021 10:08   DG HIP OPERATIVE UNILAT WITH PELVIS RIGHT  Result Date: 02/03/2021 CLINICAL DATA:  Surgery, elective Z41.9 (ICD-10-CM). Additional history provided by technologist: Right total hip arthroplasty anterior approach. Provided fluoroscopy time 15 seconds (1.21 mGy). EXAM: OPERATIVE right HIP (WITH PELVIS IF PERFORMED) 5 VIEWS TECHNIQUE: Fluoroscopic spot image(s) were submitted for interpretation post-operatively. COMPARISON:  Radiographs of the right hip 01/21/2020. FINDINGS: Five intraoperative fluoroscopic images of the right hip are submitted. On the provided images, there are findings of interval right total hip arthroplasty. The femoral and acetabular components appear well seated. No unexpected finding on the provided views. Redemonstrated sequela of prior left total hip arthroplasty. IMPRESSION: Five intraoperative fluoroscopic images of the right hip from right total hip arthroplasty. Electronically Signed   By: Kellie Simmering D.O.   On: 02/03/2021 10:05       Follow-up Information     [name], MD. Go on 02/09/2021.   Specialty:  Orthopedic Surgery Why: at 10:00 am for your post op in [place] office with Dr. Verlene Mayer information: [place] [address] [phone]                 Discharge Plan:  discharge to [facility]  Disposition:     Signed: Benjiman Core  02/07/2021, 4:17 PM

## 2021-02-07 NOTE — ED Provider Notes (Signed)
[facility] EMERGENCY DEPARTMENT Provider Note   CSN: [id number] Arrival date & time: 02/07/21  1740     History Chief Complaint  Patient presents with   Leg Pain    Alexis [name] is a 70 y.o. female.  HPI  Patient with significant medical history of asthma, breast cancer, GERD, vertigo presents  with chief complaint of right leg pain.  Patient states she recently had surgery on her right hip approximately a week and half ago,  started to have some pain in her right lower leg.  She noted yesterday that she had a bruise on the posterior aspect of her thigh and is concerned for possible blood clot.  She denies paresthesias or weakness in her leg,  denies history of PEs or DVTs, currently on hormone therapy.  She denies  chest pain or shortness of breath, fevers or chills.  She has no other complaints at this time.  Past Medical History:  Diagnosis Date   Allergy    Arthritis    bilateral hips   Asthma    environmental now resolved   Breast cancer ([place]) 11/16/2015   Right Breast   Chronic fatigue    Cough    Family history of breast cancer    Family history of lung cancer    GERD (gastroesophageal reflux disease)    Heart murmur    "years ago, when I was young"   Hemangioma    History of hiatal hernia    Hyperlipidemia    IBS (irritable bowel syndrome)    Iron deficiency anemia    Kyphosis of thoracic region    MVP (mitral valve prolapse)    Osteopenia    Personal history of radiation therapy    SAH (subarachnoid hemorrhage) ([place])    Vertigo     Patient Active Problem List   Diagnosis Date Noted   Arthritis of right hip 02/03/2021   Unilateral primary osteoarthritis, right hip 06/24/2020   Trochanteric bursitis, right hip 06/23/2020   Thrombocytopenia ([place]) 05/30/2020   Genetic testing 02/09/2019   Family history of breast cancer    Family history of lung cancer    Cerumen impaction 06/05/2017   Upper airway cough syndrome 04/30/2017   Palpitations 04/30/2017    Chronic bilateral low back pain 02/28/2017   History of total left hip arthroplasty 01/24/2017   Malignant neoplasm of upper-outer quadrant of right breast in female, estrogen receptor positive ([place]) 11/17/2015   Breast mass 11/15/2015   Radicular pain of left lower extremity 10/28/2015   Microcytic anemia 04/22/2015   Health care maintenance 03/25/2013   Arthralgia 07/27/2011   Premature atrial contractions 06/24/2009   DIZZINESS OR VERTIGO 05/30/2007   HEMANGIOMA 05/02/2007   Hyperlipidemia LDL goal <160 05/02/2007   SAH  h/o 2003 05/02/2007   GERD 05/02/2007   IRRITABLE BOWEL SYNDROME 05/02/2007   Osteoporosis 05/02/2007   KYPHOSCOLIOSIS, 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   FATIGUE, CHRONIC 05/02/2007   COUGH 05/02/2007    Past Surgical History:  Procedure Laterality Date   APPENDECTOMY     BREAST BIOPSY Right 11/16/2015   U/S Core- Malignant   BREAST BIOPSY Left 06/21/2005   Stereo- Benign   BREAST EXCISIONAL BIOPSY Left 2007   BREAST LUMPECTOMY Right 12/22/2015   BREAST LUMPECTOMY WITH RADIOACTIVE SEED AND SENTINEL LYMPH NODE BIOPSY Right 12/22/2015   Procedure: BREAST LUMPECTOMY WITH RADIOACTIVE SEED AND SENTINEL LYMPH NODE BIOPSY;  Surgeon: Alphonsa Overall, MD;  Location: [place];  Service: General;  Laterality: Right;  COLONOSCOPY     FOOT SURGERY Right    bunionectomy   MOUTH SURGERY     [facility]  2003   s/p embolization per Dr. Estanislado Pandy   TONSILLECTOMY     TOTAL HIP ARTHROPLASTY Left 01/11/2017   Procedure: LEFT TOTAL HIP ARTHROPLASTY ANTERIOR APPROACH;  Surgeon: [name], MD;  Location: [place];  Service: Orthopedics;  Laterality: Left;   TOTAL HIP ARTHROPLASTY Right 02/03/2021   Procedure: RIGHT TOTAL HIP ARTHROPLASTY ANTERIOR APPROACH;  Surgeon: [name], MD;  Location: [place];  Service: Orthopedics;  Laterality: Right;   TUBAL LIGATION     UPPER GASTROINTESTINAL ENDOSCOPY       OB History   No obstetric history on file.     Family History   Problem Relation Age of Onset   Hyperlipidemia Mother    Breast cancer Mother 66   Heart attack Father    Diabetes Brother    Breast cancer Maternal Aunt        diagnosed 53s or 28s   Breast cancer Paternal Aunt 18       diagnosed in her 52s   Lung cancer Paternal Uncle    Colon cancer Neg Hx    Colon polyps Neg Hx    Esophageal cancer Neg Hx    Rectal cancer Neg Hx    Stomach cancer Neg Hx     Social History   Tobacco Use   Smoking status: Never   Smokeless tobacco: Never  Vaping Use   Vaping Use: Never used  Substance Use Topics   Alcohol use: No   Drug use: No    Home Medications Prior to Admission medications   Medication Sig Start Date End Date Taking? Authorizing Provider  aspirin EC 325 MG tablet Take 1 tablet (325 mg total) by mouth daily. MUST TAKE AT LEAST 4 WEEKS POSTOP FOR DVT PROPHYLAXIS. 02/03/21  Yes Lanae Crumbly, PA-C  B Complex-C (SUPER B COMPLEX/VITAMIN C PO) Take 1 tablet by mouth every evening.   Yes [provider]  chlorpheniramine (CHLOR-TRIMETON) 4 MG tablet Take 4 mg by mouth at bedtime.   Yes [provider]  Cholecalciferol (VITAMIN D3) 50 MCG (2000 UT) TABS Take 2,000 Units by mouth in the morning.   Yes [provider]  docusate sodium (COLACE) 100 MG capsule Take 100 mg by mouth in the morning and at bedtime.   Yes [provider]  methocarbamol (ROBAXIN) 500 MG tablet Take 1 tablet (500 mg total) by mouth every 6 (six) hours as needed for muscle spasms. 02/03/21  Yes Lanae Crumbly, PA-C  oxyCODONE-acetaminophen (PERCOCET/ROXICET) 5-325 MG tablet Take 1 tablet by mouth every 4 (four) hours as needed for severe pain. 02/03/21  Yes Lanae Crumbly, PA-C  pravastatin (PRAVACHOL) 40 MG tablet TAKE 1 TABLET BY MOUTH EVERY DAY IN THE EVENING 08/12/20  Yes Tanda Rockers, MD  tamoxifen (NOLVADEX) 20 MG tablet TAKE 1 TABLET BY MOUTH EVERYDAY AT BEDTIME 01/19/21  Yes Magrinat, Virgie Dad, MD  psyllium (REGULOID) 0.52 g  capsule Take 0.52 g by mouth in the morning and at bedtime. Patient not taking: No sig reported    [provider]    Allergies    Other and Latex  Review of Systems   Review of Systems  Constitutional:  Negative for chills and fever.  HENT:  Negative for congestion.   Respiratory:  Negative for shortness of breath.   Cardiovascular:  Negative for chest pain.  Gastrointestinal:  Negative for abdominal pain.  Genitourinary:  Negative for enuresis.  Musculoskeletal:  Negative for back pain.       Right leg pain    Skin:  Positive for color change. Negative for rash.  Neurological:  Negative for dizziness.  Hematological:  Does not bruise/bleed easily.   Physical Exam Updated Vital Signs BP 138/62   Pulse 72   Temp 98.2 F (36.8 C) (Oral)   Resp 18   SpO2 97%   Physical Exam Vitals and nursing note reviewed.  Constitutional:      General: She is not in acute distress.    Appearance: She is not ill-appearing.  HENT:     Head: Normocephalic and atraumatic.     Nose: No congestion.  Eyes:     Conjunctiva/sclera: Conjunctivae normal.  Cardiovascular:     Rate and Rhythm: Normal rate and regular rhythm.     Pulses: Normal pulses.     Heart sounds: No murmur heard.   No friction rub. No gallop.  Pulmonary:     Effort: No respiratory distress.     Breath sounds: No wheezing, rhonchi or rales.  Musculoskeletal:     Comments: Lower extremities were visualized there is no edema or unilateral swelling present my exam, calfs are nontender to palpation, no palpable cords noted.  Patient does have ecchymosis on the right upper leg on the medial aspect, it was slightly warm to the touch, nontender to palpation, no fluctuant induration present.  Patient had full range of motion the lower extremities neurovascular fully intact.  Skin:    General: Skin is warm and dry.  Neurological:     Mental Status: She is alert.  Psychiatric:        Mood and Affect: Mood normal.     ED Results / Procedures / Treatments   Labs (all labs ordered are listed, but only abnormal results are displayed) Labs Reviewed  CBC WITH DIFFERENTIAL/PLATELET - Abnormal; Notable for the following components:      Result Value   RBC 3.22 (*)    Hemoglobin 9.5 (*)    HCT 29.4 (*)    All other components within normal limits  BASIC METABOLIC PANEL - Abnormal; Notable for the following components:   Potassium 3.1 (*)    Glucose, Bld 105 (*)    Calcium 8.3 (*)    All other components within normal limits    EKG None  Radiology No results found.  Procedures Procedures   Medications Ordered in ED Medications  apixaban (ELIQUIS) tablet 10 mg (10 mg Oral Given 02/07/21 2133)    ED Course  I have reviewed the triage vital signs and the nursing notes.  Pertinent labs & imaging results that were available during my care of the patient were reviewed by me and considered in my medical decision making (see chart for details).    MDM Rules/Calculators/A&P                          Initial impression-patient presents with chief complaint of right leg pain.  She is alert, does not appear acute stress, vital signs reassuring.  Concern for possible DVT, will obtain basic lab work-up, start patient on anticoagulant reassess.  Work-up-CBC shows normocytic anemia hemoglobin of 9.5, BMP shows slight hypokalemia 3.1, glucose 105  Rule out-I have low suspicion for septic arthritis as patient denies IV drug use, skin exam was performed no erythematous, edematous, warm joints noted on exam, no new  heart murmur heard on exam.  Low suspicion for fracture or dislocation is no gross deformities present on exam, she has no traumatic injury to the area and will defer imaging at this time.  Low suspicion for compartment syndrome as area was palpated it was soft to the touch, neurovascular fully intact.  I have low suspicion for PE as patient denies pleuritic chest pain, shortness of breath, she is  nontachycardic, nontachypneic and satting at high percent room air.  noted that patient had a decrease in her hemoglobin 11.5 down to 9.5 from 2 weeks, suspect this is likely due to blood loss from surgery.  She is hemodynamically stable does not endorse chest pain, shortness of breath, lightheaded or dizziness, I have low suspicion for acute bleed at this time.   Plan-  Leg pain- I am concerned for possible DVT she has noted ecchymosis and had recent surgery, recommends anticoagulants help decrease further clotting discussed risks and benefits patient was agreement this plan will  have patient come back tomorrow for a DVT study as we not have ultrasound at this time.  Gave her strict return precautions for any injury due to the increased bleed risk.  Vital signs have remained stable, no indication for hospital admission.  Patient discussed with attending and they agreed with assessment and plan.  Patient given at home care as well strict return precautions.  Patient verbalized that they understood agreed to said plan.  Final Clinical Impression(s) / ED Diagnoses Final diagnoses:  Right leg pain    Rx / DC Orders ED Discharge Orders          Ordered    US Venous Img Lower Unilateral Right        02/07/21 2111             Marcello Fennel, PA-C [phone] [id number]    Lianne Cure, DO [id number]

## 2021-02-07 NOTE — ED Triage Notes (Signed)
States she is concerned she may have a blood clot , states she recently had right hip surgery and is concerned she may have a clot in her leg

## 2021-02-07 NOTE — Discharge Instructions (Addendum)
I am concerned that you have a blood clot in your right leg.  I have given you a singular dose of a blood thinner.  Please be aware this can make you bleed more easily if you have any trauma i.e. fall, hit your head, get into a car accident you must come back here for reevaluation.  I want you to come back here tomorrow for an DVT study, you must call the number above to schedule your appointments.  Come back to the emergency department if you develop chest pain, shortness of breath, severe abdominal pain, uncontrolled nausea, vomiting, diarrhea.

## 2021-02-08 ENCOUNTER — Ambulatory Visit (HOSPITAL_COMMUNITY)
Admission: RE | Admit: 2021-02-08 | Discharge: 2021-02-08 | Disposition: A | Discharge status: Home / Self Care | Payer: Medicare Other | Source: Ambulatory Visit | Attending: Student | Admitting: Student

## 2021-02-08 ENCOUNTER — Telehealth: Payer: Self-pay | Admitting: Radiology

## 2021-02-08 DIAGNOSIS — M79604 Pain in right leg: Secondary | ICD-10-CM | POA: Insufficient documentation

## 2021-02-08 DIAGNOSIS — M79661 Pain in right lower leg: Secondary | ICD-10-CM | POA: Diagnosis not present

## 2021-02-08 NOTE — Telephone Encounter (Signed)
Tera Helper, patient's husband, called [place] office this morning stating patient went to ED yesterday due to swelling and bruising and concern of possible blood clot. Clare Gandy states that patient was told she needs [place] to R/O DVT, however, they will need Dr. Lorin Mercy to order. Per notes in chart, order has already been entered by ED physician. Baldo Ash [facility]) called Clare Gandy and gave number to call to schedule appointment for doppler. Ted advised to call Baldo Ash or myself if Alexis [name] is unable to get her seen today and I will see what availability I can find in [place] office.  Patient was given Eliquis in ED and states that her leg feels better today. She has follow up appointment in [place] office to see Dr. Lorin Mercy tomorrow.

## 2021-02-08 NOTE — ED Provider Notes (Signed)
Seen by provider yesterday afternoon for right leg pain after recent orthopedic surgery.  Was given dose of anticoagulant yesterday.  Patient returns today for ultrasound to rule out DVT.  Ultrasound does not show evidence of DVT.  NV intact. No CP, SOB.  Has follow-up with orthopedics tomorrow.  Encouraged follow-up, strict return precautions.   Nettie Elm, PA-C 02/08/21 [id number]    Milton Ferguson, MD 02/10/21 [phone]

## 2021-02-09 ENCOUNTER — Encounter: Payer: Self-pay | Admitting: Orthopaedic Surgery

## 2021-02-09 ENCOUNTER — Other Ambulatory Visit: Payer: Self-pay

## 2021-02-09 ENCOUNTER — Other Ambulatory Visit (INDEPENDENT_AMBULATORY_CARE_PROVIDER_SITE_OTHER): Payer: Medicare Other

## 2021-02-09 ENCOUNTER — Ambulatory Visit (INDEPENDENT_AMBULATORY_CARE_PROVIDER_SITE_OTHER): Payer: Medicare Other | Admitting: Orthopaedic Surgery

## 2021-02-09 VITALS — Ht 62.0 in | Wt 111.0 lb

## 2021-02-09 DIAGNOSIS — Z96641 Presence of right artificial hip joint: Secondary | ICD-10-CM | POA: Diagnosis not present

## 2021-02-09 DIAGNOSIS — Z96649 Presence of unspecified artificial hip joint: Secondary | ICD-10-CM | POA: Insufficient documentation

## 2021-02-09 NOTE — Progress Notes (Signed)
Post-Op Visit Note   Patient: Alexis [name]           Date of Birth: 02/22/1951           MRN: [id number] Visit Date: 02/09/2021 PCP: Tanda Rockers, MD   Assessment & Plan: Follow-up total of arthroplasty incision was good she noticed some swelling bruising was concerned she had a blood clot and went to get a Doppler done emergency room which was negative for DVT.  She was on 1 aspirin a day before surgery and is taking 1 aspirin a day postop as instructed.  She is walking without a cane or a walker and has a normal gait 1 week after surgery.  Chief Complaint:  Chief Complaint  Patient presents with   Right Hip - Routine Post Op    02/03/2021 Right THA   Visit Diagnoses:  1. Status post total replacement of right hip     Plan: New dressing applied.  Reviewed x-rays of the right hip show good position alignment good fit and fill and equal leg lengths.  Return 1 week for staple removal.  Follow-Up Instructions: No follow-ups on file.   Orders:  Orders Placed This Encounter  Procedures   XR HIP UNILAT W OR W/O PELVIS 2-3 VIEWS RIGHT   No orders of the defined types were placed in this encounter.   Imaging: US Venous Img Lower Unilateral Right (DVT)  Result Date: 02/08/2021 CLINICAL DATA:  Right lower extremity pain after hip arthroplasty EXAM: RIGHT LOWER EXTREMITY VENOUS DOPPLER ULTRASOUND TECHNIQUE: Gray-scale sonography with graded compression, as well as color Doppler and duplex ultrasound were performed to evaluate the lower extremity deep venous systems from the level of the common femoral vein and including the common femoral, femoral, profunda femoral, popliteal and calf veins including the posterior tibial, peroneal and gastrocnemius veins when visible. The superficial great saphenous vein was also interrogated. Spectral Doppler was utilized to evaluate flow at rest and with distal augmentation maneuvers in the common femoral, femoral and popliteal veins. COMPARISON:   None. FINDINGS: Contralateral Common Femoral Vein: Respiratory phasicity is normal and symmetric with the symptomatic side. No evidence of thrombus. Normal compressibility. Common Femoral Vein: No evidence of thrombus. Normal compressibility, respiratory phasicity and response to augmentation. Saphenofemoral Junction: No evidence of thrombus. Normal compressibility and flow on color Doppler imaging. Profunda Femoral Vein: No evidence of thrombus. Normal compressibility and flow on color Doppler imaging. Femoral Vein: No evidence of thrombus. Normal compressibility, respiratory phasicity and response to augmentation. Popliteal Vein: No evidence of thrombus. Normal compressibility, respiratory phasicity and response to augmentation. Calf Veins: No evidence of thrombus. Normal compressibility and flow on color Doppler imaging. Other Findings:  None. IMPRESSION: No evidence of deep venous thrombosis. Electronically Signed   By: Albin Felling M.D.   On: 02/08/2021 13:53    PMFS History: Patient Active Problem List   Diagnosis Date Noted   Arthritis of right hip 02/03/2021   Unilateral primary osteoarthritis, right hip 06/24/2020   Trochanteric bursitis, right hip 06/23/2020   Thrombocytopenia ([place]) 05/30/2020   Genetic testing 02/09/2019   Family history of breast cancer    Family history of lung cancer    Cerumen impaction 06/05/2017   Upper airway cough syndrome 04/30/2017   Palpitations 04/30/2017   Chronic bilateral low back pain 02/28/2017   History of total left hip arthroplasty 01/24/2017   Malignant neoplasm of upper-outer quadrant of right breast in female, estrogen receptor positive ([place]) 11/17/2015   Breast  mass 11/15/2015   Radicular pain of left lower extremity 10/28/2015   Microcytic anemia 04/22/2015   Health care maintenance 03/25/2013   Arthralgia 07/27/2011   Premature atrial contractions 06/24/2009   DIZZINESS OR VERTIGO 05/30/2007   HEMANGIOMA 05/02/2007   Hyperlipidemia LDL  goal <160 05/02/2007   SAH  h/o 2003 05/02/2007   GERD 05/02/2007   IRRITABLE BOWEL SYNDROME 05/02/2007   Osteoporosis 05/02/2007   KYPHOSCOLIOSIS, 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   FATIGUE, CHRONIC 05/02/2007   COUGH 05/02/2007   Past Medical History:  Diagnosis Date   Allergy    Arthritis    bilateral hips   Asthma    environmental now resolved   Breast cancer ([place]) 11/16/2015   Right Breast   Chronic fatigue    Cough    Family history of breast cancer    Family history of lung cancer    GERD (gastroesophageal reflux disease)    Heart murmur    "years ago, when I was young"   Hemangioma    History of hiatal hernia    Hyperlipidemia    IBS (irritable bowel syndrome)    Iron deficiency anemia    Kyphosis of thoracic region    MVP (mitral valve prolapse)    Osteopenia    Personal history of radiation therapy    SAH (subarachnoid hemorrhage) ([place])    Vertigo     Family History  Problem Relation Age of Onset   Hyperlipidemia Mother    Breast cancer Mother 32   Heart attack Father    Diabetes Brother    Breast cancer Maternal Aunt        diagnosed 34s or 11s   Breast cancer Paternal Aunt 70       diagnosed in her 106s   Lung cancer Paternal Uncle    Colon cancer Neg Hx    Colon polyps Neg Hx    Esophageal cancer Neg Hx    Rectal cancer Neg Hx    Stomach cancer Neg Hx     Past Surgical History:  Procedure Laterality Date   APPENDECTOMY     BREAST BIOPSY Right 11/16/2015   U/S Core- Malignant   BREAST BIOPSY Left 06/21/2005   Stereo- Benign   BREAST EXCISIONAL BIOPSY Left 2007   BREAST LUMPECTOMY Right 12/22/2015   BREAST LUMPECTOMY WITH RADIOACTIVE SEED AND SENTINEL LYMPH NODE BIOPSY Right 12/22/2015   Procedure: BREAST LUMPECTOMY WITH RADIOACTIVE SEED AND SENTINEL LYMPH NODE BIOPSY;  Surgeon: Alphonsa Overall, MD;  Location: [facility];  Service: General;  Laterality: Right;   COLONOSCOPY     FOOT SURGERY Right    bunionectomy   MOUTH SURGERY     [facility]   2003   s/p embolization per Dr. Estanislado Pandy   TONSILLECTOMY     TOTAL HIP ARTHROPLASTY Left 01/11/2017   Procedure: LEFT TOTAL HIP ARTHROPLASTY ANTERIOR APPROACH;  Surgeon: [name], MD;  Location: [facility];  Service: Orthopedics;  Laterality: Left;   TOTAL HIP ARTHROPLASTY Right 02/03/2021   Procedure: RIGHT TOTAL HIP ARTHROPLASTY ANTERIOR APPROACH;  Surgeon: [name], MD;  Location: [place];  Service: Orthopedics;  Laterality: Right;   TUBAL LIGATION     UPPER GASTROINTESTINAL ENDOSCOPY     Social History   Occupational History   Occupation: CSR  Tobacco Use   Smoking status: Never   Smokeless tobacco: Never  Vaping Use   Vaping Use: Never used  Substance and Sexual Activity   Alcohol use: No   Drug use:  No   Sexual activity: Not on file

## 2021-02-13 ENCOUNTER — Telehealth: Payer: Self-pay | Admitting: *Deleted

## 2021-02-13 NOTE — Telephone Encounter (Signed)
Ortho bundle 7 day call to patient.

## 2021-02-16 ENCOUNTER — Ambulatory Visit (INDEPENDENT_AMBULATORY_CARE_PROVIDER_SITE_OTHER): Payer: Medicare Other | Admitting: Orthopaedic Surgery

## 2021-02-16 ENCOUNTER — Encounter: Payer: Self-pay | Admitting: Orthopaedic Surgery

## 2021-02-16 ENCOUNTER — Other Ambulatory Visit: Payer: Self-pay

## 2021-02-16 VITALS — Ht 62.0 in | Wt 111.0 lb

## 2021-02-16 DIAGNOSIS — Z96641 Presence of right artificial hip joint: Secondary | ICD-10-CM

## 2021-02-16 NOTE — Progress Notes (Signed)
Post-Op Visit Note   Patient: Alexis [name]           Date of Birth: [birth year]           MRN: [id number] Visit Date: 02/16/2021 PCP: Tanda Rockers, MD   Assessment & Plan: Post right total of arthroplasty.  Incision looks good.  Staples harvested Steri-Strips applied.  She is walking without a cane.  She still has some pain distal femur which she states she feels around her knee not well localized which is typical.  When she initially gets up she has slight limp then improves after several steps.  Recheck 2 months.  She can gradually resume activities including golf as her pain decreases.  Chief Complaint:  Chief Complaint  Patient presents with   Right Hip - Follow-up    02/03/2021 Right THA   Visit Diagnoses:  1. Status post total replacement of right hip   2. S/P total right hip arthroplasty     Plan: Return 2 months no x-ray needed on return.    Follow-Up Instructions: No follow-ups on file.   Orders:  No orders of the defined types were placed in this encounter.  No orders of the defined types were placed in this encounter.   Imaging: No results found.  PMFS History: Patient Active Problem List   Diagnosis Date Noted   S/P total right hip arthroplasty 02/16/2021   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/09/2021   Arthritis of right hip 02/03/2021   Thrombocytopenia ([place]) 05/30/2020   Genetic testing 02/09/2019   Family history of breast cancer    Family history of lung cancer    Cerumen impaction 06/05/2017   Upper airway cough syndrome 04/30/2017   Palpitations 04/30/2017   Chronic bilateral low back pain 02/28/2017   History of total left hip arthroplasty 01/24/2017   Malignant neoplasm of upper-outer quadrant of right breast in female, estrogen receptor positive ([place]) 11/17/2015   Breast mass 11/15/2015   Radicular pain of left lower extremity 10/28/2015   Microcytic anemia 04/22/2015   Health care maintenance 03/25/2013   Arthralgia 07/27/2011   Premature  atrial contractions 06/24/2009   DIZZINESS OR VERTIGO 05/30/2007   HEMANGIOMA 05/02/2007   Hyperlipidemia LDL goal <160 05/02/2007   SAH  h/o 2003 05/02/2007   GERD 05/02/2007   IRRITABLE BOWEL SYNDROME 05/02/2007   Osteoporosis 05/02/2007   KYPHOSCOLIOSIS, 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   FATIGUE, CHRONIC 05/02/2007   COUGH 05/02/2007   Past Medical History:  Diagnosis Date   Allergy    Arthritis    bilateral hips   Asthma    environmental now resolved   Breast cancer ([place]) 11/16/2015   Right Breast   Chronic fatigue    Cough    Family history of breast cancer    Family history of lung cancer    GERD (gastroesophageal reflux disease)    Heart murmur    "years ago, when I was young"   Hemangioma    History of hiatal hernia    Hyperlipidemia    IBS (irritable bowel syndrome)    Iron deficiency anemia    Kyphosis of thoracic region    MVP (mitral valve prolapse)    Osteopenia    Personal history of radiation therapy    SAH (subarachnoid hemorrhage) ([place])    Vertigo     Family History  Problem Relation Age of Onset   Hyperlipidemia Mother    Breast cancer Mother 21   Heart attack Father    Diabetes  Brother    Breast cancer Maternal Aunt        diagnosed 47s or 64s   Breast cancer Paternal Aunt 37       diagnosed in her 18s   Lung cancer Paternal Uncle    Colon cancer Neg Hx    Colon polyps Neg Hx    Esophageal cancer Neg Hx    Rectal cancer Neg Hx    Stomach cancer Neg Hx     Past Surgical History:  Procedure Laterality Date   APPENDECTOMY     BREAST BIOPSY Right 11/16/2015   U/S Core- Malignant   BREAST BIOPSY Left 06/21/2005   Stereo- Benign   BREAST EXCISIONAL BIOPSY Left 2007   BREAST LUMPECTOMY Right 12/22/2015   BREAST LUMPECTOMY WITH RADIOACTIVE SEED AND SENTINEL LYMPH NODE BIOPSY Right 12/22/2015   Procedure: BREAST LUMPECTOMY WITH RADIOACTIVE SEED AND SENTINEL LYMPH NODE BIOPSY;  Surgeon: Alphonsa Overall, MD;  Location: [facility];   Service: General;  Laterality: Right;   COLONOSCOPY     FOOT SURGERY Right    bunionectomy   MOUTH SURGERY     [facility]  2003   s/p embolization per Dr. Estanislado Pandy   TONSILLECTOMY     TOTAL HIP ARTHROPLASTY Left 01/11/2017   Procedure: LEFT TOTAL HIP ARTHROPLASTY ANTERIOR APPROACH;  Surgeon: [name], MD;  Location: [facility];  Service: Orthopedics;  Laterality: Left;   TOTAL HIP ARTHROPLASTY Right 02/03/2021   Procedure: RIGHT TOTAL HIP ARTHROPLASTY ANTERIOR APPROACH;  Surgeon: [name], MD;  Location: [place];  Service: Orthopedics;  Laterality: Right;   TUBAL LIGATION     UPPER GASTROINTESTINAL ENDOSCOPY     Social History   Occupational History   Occupation: CSR  Tobacco Use   Smoking status: Never   Smokeless tobacco: Never  Vaping Use   Vaping Use: Never used  Substance and Sexual Activity   Alcohol use: No   Drug use: No   Sexual activity: Not on file

## 2021-02-22 ENCOUNTER — Telehealth: Payer: Self-pay | Admitting: *Deleted

## 2021-02-22 NOTE — Telephone Encounter (Signed)
14 day Ortho bundle in office appointment completed.

## 2021-02-26 ENCOUNTER — Other Ambulatory Visit: Payer: Self-pay | Admitting: Surgery

## 2021-02-27 NOTE — Telephone Encounter (Signed)
Please advise 

## 2021-03-20 DIAGNOSIS — Z01411 Encounter for gynecological examination (general) (routine) with abnormal findings: Secondary | ICD-10-CM | POA: Diagnosis not present

## 2021-03-20 DIAGNOSIS — R8290 Unspecified abnormal findings in urine: Secondary | ICD-10-CM | POA: Diagnosis not present

## 2021-03-20 DIAGNOSIS — N859 Noninflammatory disorder of uterus, unspecified: Secondary | ICD-10-CM | POA: Diagnosis not present

## 2021-03-20 DIAGNOSIS — Z124 Encounter for screening for malignant neoplasm of cervix: Secondary | ICD-10-CM | POA: Diagnosis not present

## 2021-03-27 DIAGNOSIS — N950 Postmenopausal bleeding: Secondary | ICD-10-CM | POA: Diagnosis not present

## 2021-03-27 DIAGNOSIS — D261 Other benign neoplasm of corpus uteri: Secondary | ICD-10-CM | POA: Diagnosis not present

## 2021-03-28 ENCOUNTER — Telehealth: Payer: Self-pay | Admitting: *Deleted

## 2021-03-28 NOTE — Telephone Encounter (Signed)
Ortho bundle 30 day call completed. °

## 2021-03-28 NOTE — Telephone Encounter (Signed)
Attempted 30 day call and left VM on home and cell numbers requesting call back.

## 2021-04-20 ENCOUNTER — Ambulatory Visit: Payer: Medicare Other | Admitting: Orthopaedic Surgery

## 2021-04-27 ENCOUNTER — Ambulatory Visit (INDEPENDENT_AMBULATORY_CARE_PROVIDER_SITE_OTHER): Payer: Medicare Other

## 2021-04-27 ENCOUNTER — Other Ambulatory Visit: Payer: Self-pay

## 2021-04-27 ENCOUNTER — Ambulatory Visit (INDEPENDENT_AMBULATORY_CARE_PROVIDER_SITE_OTHER): Payer: Medicare Other | Admitting: Orthopaedic Surgery

## 2021-04-27 ENCOUNTER — Encounter: Payer: Self-pay | Admitting: Orthopaedic Surgery

## 2021-04-27 VITALS — Ht 62.0 in | Wt 111.0 lb

## 2021-04-27 DIAGNOSIS — M545 Low back pain, unspecified: Secondary | ICD-10-CM

## 2021-04-27 NOTE — Progress Notes (Signed)
Post-Op Visit Note   Patient: Alexis [name]           Date of Birth: [birth year]           MRN: [id number] Visit Date: 04/27/2021 PCP: Tanda Rockers, MD   Assessment & Plan: She had a fall on Halloween when she was trying to back up a car in the dark and caught her foot falling forward.  X-rays of her pelvis shows satisfactory bilateral total hip arthroplasties.  Lumbar x-rays again show significant scoliosis with facet arthropathy and some calcification of the abdominal aorta.  She states she still has some soreness in her back that radiates to her side.  No neurogenic claudication symptoms.  Good relief of preop hip pain with a recent right total of arthroplasty from 02/03/2021.  She can follow-up if she has increasing problems such as claudication symptoms.  Chief Complaint:  Chief Complaint  Patient presents with   Right Hip - Follow-up    02/03/2021 Right THA   Lower Back - Pain    Fall 04/17/2021   Visit Diagnoses:  1. Acute bilateral low back pain, unspecified whether sciatica present     Plan: No acute injury noted from x-rays after a fall on 04/17/2021.  Satisfactory total hip arthroplasty good relief preop pain return as needed.  Follow-Up Instructions: No follow-ups on file.   Orders:  Orders Placed This Encounter  Procedures   XR Lumbar Spine 2-3 Views   XR Pelvis 1-2 Views   No orders of the defined types were placed in this encounter.   Imaging: No results found.  PMFS History: Patient Active Problem List   Diagnosis Date Noted   S/P total right hip arthroplasty 02/16/2021   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/09/2021   Arthritis of right hip 02/03/2021   Thrombocytopenia ([place]) 05/30/2020   Genetic testing 02/09/2019   Family history of breast cancer    Family history of lung cancer    Cerumen impaction 06/05/2017   Upper airway cough syndrome 04/30/2017   Palpitations 04/30/2017   Chronic bilateral low back pain 02/28/2017   History of total left hip  arthroplasty 01/24/2017   Malignant neoplasm of upper-outer quadrant of right breast in female, estrogen receptor positive ([place]) 11/17/2015   Breast mass 11/15/2015   Radicular pain of left lower extremity 10/28/2015   Microcytic anemia 04/22/2015   Health care maintenance 03/25/2013   Arthralgia 07/27/2011   Premature atrial contractions 06/24/2009   DIZZINESS OR VERTIGO 05/30/2007   HEMANGIOMA 05/02/2007   Hyperlipidemia LDL goal <160 05/02/2007   SAH  h/o 2003 05/02/2007   GERD 05/02/2007   IRRITABLE BOWEL SYNDROME 05/02/2007   Osteoporosis 05/02/2007   KYPHOSCOLIOSIS, 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   FATIGUE, CHRONIC 05/02/2007   COUGH 05/02/2007   Past Medical History:  Diagnosis Date   Allergy    Arthritis    bilateral hips   Asthma    environmental now resolved   Breast cancer ([place]) 11/16/2015   Right Breast   Chronic fatigue    Cough    Family history of breast cancer    Family history of lung cancer    GERD (gastroesophageal reflux disease)    Heart murmur    "years ago, when I was young"   Hemangioma    History of hiatal hernia    Hyperlipidemia    IBS (irritable bowel syndrome)    Iron deficiency anemia    Kyphosis of thoracic region    MVP (mitral valve prolapse)  Osteopenia    Personal history of radiation therapy    SAH (subarachnoid hemorrhage) ([facility])    Vertigo     Family History  Problem Relation Age of Onset   Hyperlipidemia Mother    Breast cancer Mother 66   Heart attack Father    Diabetes Brother    Breast cancer Maternal Aunt        diagnosed 65s or 26s   Breast cancer Paternal Aunt 48       diagnosed in her 35s   Lung cancer Paternal Uncle    Colon cancer Neg Hx    Colon polyps Neg Hx    Esophageal cancer Neg Hx    Rectal cancer Neg Hx    Stomach cancer Neg Hx     Past Surgical History:  Procedure Laterality Date   APPENDECTOMY     BREAST BIOPSY Right 11/16/2015   U/S Core- Malignant   BREAST BIOPSY Left 06/21/2005   Stereo-  Benign   BREAST EXCISIONAL BIOPSY Left 2007   BREAST LUMPECTOMY Right 12/22/2015   BREAST LUMPECTOMY WITH RADIOACTIVE SEED AND SENTINEL LYMPH NODE BIOPSY Right 12/22/2015   Procedure: BREAST LUMPECTOMY WITH RADIOACTIVE SEED AND SENTINEL LYMPH NODE BIOPSY;  Surgeon: Alphonsa Overall, MD;  Location: [place];  Service: General;  Laterality: Right;   COLONOSCOPY     FOOT SURGERY Right    bunionectomy   MOUTH SURGERY     [facility]  2003   s/p embolization per Dr. Estanislado Pandy   TONSILLECTOMY     TOTAL HIP ARTHROPLASTY Left 01/11/2017   Procedure: LEFT TOTAL HIP ARTHROPLASTY ANTERIOR APPROACH;  Surgeon: [name], MD;  Location: [place];  Service: Orthopedics;  Laterality: Left;   TOTAL HIP ARTHROPLASTY Right 02/03/2021   Procedure: RIGHT TOTAL HIP ARTHROPLASTY ANTERIOR APPROACH;  Surgeon: [name], MD;  Location: [place];  Service: Orthopedics;  Laterality: Right;   TUBAL LIGATION     UPPER GASTROINTESTINAL ENDOSCOPY     Social History   Occupational History   Occupation: CSR  Tobacco Use   Smoking status: Never   Smokeless tobacco: Never  Vaping Use   Vaping Use: Never used  Substance and Sexual Activity   Alcohol use: No   Drug use: No   Sexual activity: Not on file

## 2021-04-27 NOTE — Progress Notes (Deleted)
Office Visit Note   Patient: Alexis [name]           Date of Birth: 06/10/1951           MRN: [id number] Visit Date: 04/27/2021              Requested by: Tanda Rockers, MD [address] [place],  [place] [address] PCP: Tanda Rockers, MD   Assessment & Plan: Visit Diagnoses:  1. Acute bilateral low back pain, unspecified whether sciatica present     Plan: ***  Follow-Up Instructions: No follow-ups on file.   Orders:  Orders Placed This Encounter  Procedures   XR Lumbar Spine 2-3 Views   XR Pelvis 1-2 Views   No orders of the defined types were placed in this encounter.     Procedures: No procedures performed   Clinical Data: No additional findings.   Subjective: Chief Complaint  Patient presents with   Right Hip - Follow-up    02/03/2021 Right THA   Lower Back - Pain    Fall 04/17/2021    HPI  Review of Systems   Objective: Vital Signs: Ht 5\' 2"  (1.575 m)   Wt 111 lb (50.3 kg)   BMI 20.30 kg/m   Physical Exam  Ortho Exam  Specialty Comments:  No specialty comments available.  Imaging: No results found.   PMFS History: Patient Active Problem List   Diagnosis Date Noted   S/P total right hip arthroplasty 02/16/2021   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/09/2021   Arthritis of right hip 02/03/2021   Thrombocytopenia ([place]) 05/30/2020   Genetic testing 02/09/2019   Family history of breast cancer    Family history of lung cancer    Cerumen impaction 06/05/2017   Upper airway cough syndrome 04/30/2017   Palpitations 04/30/2017   Chronic bilateral low back pain 02/28/2017   History of total left hip arthroplasty 01/24/2017   Malignant neoplasm of upper-outer quadrant of right breast in female, estrogen receptor positive ([place]) 11/17/2015   Breast mass 11/15/2015   Radicular pain of left lower extremity 10/28/2015   Microcytic anemia 04/22/2015   Health care maintenance 03/25/2013   Arthralgia 07/27/2011   Premature atrial  contractions 06/24/2009   DIZZINESS OR VERTIGO 05/30/2007   HEMANGIOMA 05/02/2007   Hyperlipidemia LDL goal <160 05/02/2007   SAH  h/o 2003 05/02/2007   GERD 05/02/2007   IRRITABLE BOWEL SYNDROME 05/02/2007   Osteoporosis 05/02/2007   KYPHOSCOLIOSIS, 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   FATIGUE, CHRONIC 05/02/2007   COUGH 05/02/2007   Past Medical History:  Diagnosis Date   Allergy    Arthritis    bilateral hips   Asthma    environmental now resolved   Breast cancer ([place]) 11/16/2015   Right Breast   Chronic fatigue    Cough    Family history of breast cancer    Family history of lung cancer    GERD (gastroesophageal reflux disease)    Heart murmur    "years ago, when I was young"   Hemangioma    History of hiatal hernia    Hyperlipidemia    IBS (irritable bowel syndrome)    Iron deficiency anemia    Kyphosis of thoracic region    MVP (mitral valve prolapse)    Osteopenia    Personal history of radiation therapy    SAH (subarachnoid hemorrhage) ([facility])    Vertigo     Family History  Problem Relation Age of Onset   Hyperlipidemia  Mother    Breast cancer Mother 40   Heart attack Father    Diabetes Brother    Breast cancer Maternal Aunt        diagnosed 54s or 66s   Breast cancer Paternal Aunt 38       diagnosed in her 13s   Lung cancer Paternal Uncle    Colon cancer Neg Hx    Colon polyps Neg Hx    Esophageal cancer Neg Hx    Rectal cancer Neg Hx    Stomach cancer Neg Hx     Past Surgical History:  Procedure Laterality Date   APPENDECTOMY     BREAST BIOPSY Right 11/16/2015   U/S Core- Malignant   BREAST BIOPSY Left 06/21/2005   Stereo- Benign   BREAST EXCISIONAL BIOPSY Left 2007   BREAST LUMPECTOMY Right 12/22/2015   BREAST LUMPECTOMY WITH RADIOACTIVE SEED AND SENTINEL LYMPH NODE BIOPSY Right 12/22/2015   Procedure: BREAST LUMPECTOMY WITH RADIOACTIVE SEED AND SENTINEL LYMPH NODE BIOPSY;  Surgeon: Alphonsa Overall, MD;  Location: [place];  Service:  General;  Laterality: Right;   COLONOSCOPY     FOOT SURGERY Right    bunionectomy   MOUTH SURGERY     [facility]  2003   s/p embolization per Dr. Estanislado Pandy   TONSILLECTOMY     TOTAL HIP ARTHROPLASTY Left 01/11/2017   Procedure: LEFT TOTAL HIP ARTHROPLASTY ANTERIOR APPROACH;  Surgeon: [name], MD;  Location: [place];  Service: Orthopedics;  Laterality: Left;   TOTAL HIP ARTHROPLASTY Right 02/03/2021   Procedure: RIGHT TOTAL HIP ARTHROPLASTY ANTERIOR APPROACH;  Surgeon: [name], MD;  Location: [place];  Service: Orthopedics;  Laterality: Right;   TUBAL LIGATION     UPPER GASTROINTESTINAL ENDOSCOPY     Social History   Occupational History   Occupation: CSR  Tobacco Use   Smoking status: Never   Smokeless tobacco: Never  Vaping Use   Vaping Use: Never used  Substance and Sexual Activity   Alcohol use: No   Drug use: No   Sexual activity: Not on file

## 2021-05-31 ENCOUNTER — Telehealth: Payer: Self-pay | Admitting: *Deleted

## 2021-05-31 NOTE — Telephone Encounter (Signed)
Ortho bundle 90 day call completed. 

## 2021-06-01 ENCOUNTER — Ambulatory Visit: Payer: Medicare Other | Admitting: Internal Medicine

## 2021-07-17 ENCOUNTER — Ambulatory Visit (INDEPENDENT_AMBULATORY_CARE_PROVIDER_SITE_OTHER): Payer: Medicare Other | Admitting: Internal Medicine

## 2021-07-17 ENCOUNTER — Other Ambulatory Visit: Payer: Self-pay

## 2021-07-17 ENCOUNTER — Encounter: Payer: Self-pay | Admitting: Internal Medicine

## 2021-07-17 DIAGNOSIS — D509 Iron deficiency anemia, unspecified: Secondary | ICD-10-CM | POA: Diagnosis not present

## 2021-07-17 DIAGNOSIS — R058 Other specified cough: Secondary | ICD-10-CM

## 2021-07-17 DIAGNOSIS — E785 Hyperlipidemia, unspecified: Secondary | ICD-10-CM | POA: Diagnosis not present

## 2021-07-17 DIAGNOSIS — R002 Palpitations: Secondary | ICD-10-CM

## 2021-07-17 DIAGNOSIS — M818 Other osteoporosis without current pathological fracture: Secondary | ICD-10-CM

## 2021-07-17 NOTE — Patient Instructions (Signed)
We will be referring you to [facility] next door and ENT in [facility] re hoarseness.  Please remember to go to the lab department   for your tests - we will call you with the results when they are available.  Follow up in pulmonary clinic is as needed

## 2021-07-17 NOTE — Progress Notes (Signed)
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 Pt states she has noticed some palpitations recently- last episode was approx 2 wks ago.   palpitations s presyncope  last x 2-3 min once a month  then feels drained ffor the rest of the day "same as in past" Excess mucus x one week with sense of excess pnds but no sneezing or obvious rhinorrhea and this doesn't disturb sleep rec Stop clariton and reduce caffeine use  For drainage / throat tickle try take CHLORPHENIRAMINE  4 mg -prn  Use the ear wax remover on your Left ear (over the counter)      06/06/2018  f/u ov/Alexis [name] re:   Hyperlipidemia / chronic rhinitis with pnds/cough /mvp with h/o  palpitations Chief Complaint  Patient presents with   Annual Exam    She is doing well and denies any co's.   Dyspnea: Not limited by breathing from desired activities   Cough: pnds/burning first thing in am  x years no better on clariton - did not try h1 as rec  Sleeping: flat bed  rec Pepcid 20 mg one hour before bedtime For drainage / throat tickle try take CHLORPHENIRAMINE  4 mg (chlortabs at [facility]) - take one every 4 hours as needed - available over the counter- may cause drowsiness so start with just a dose or two  One hour before bedtime and see how  you tolerate it before trying in daytime   GERD  Diet    11/13/2018  f/u ov/Alexis [name] re: hyperlipidemia / chronic rhinitis with pnds vs grerd  Chief Complaint  Patient presents with   Follow-up    Doing well, has occ burning in her throat but this has been better after cutting out some caffeine.    Dyspnea:  Not limited by breathing from desired activities   Cough: am burning still present, not as often while  on h1 one H1 (never tried two as rec) , no h2  Sleeping: fine flat SABA use: none 02: none  rec Pepcid 20 mg - take x one tablet x one hour before bedtime For drainage / throat tickle try take CHLORPHENIRAMINE  4 mg (chlortabs at [facility]) - take one every 4 hours as needed  GERD diet   06/15/2019  f/u ov/Alexis [name] re:   Annual eval multiple problems and new R hip/back pain Chief Complaint  Patient presents with   Annual Exam    Pt is fasting. Pt c/o right reg pain and back pain x 2-3 months.   Dyspnea:  Not limited by breathing from desired activities  / no power walking but on her feet all day Cough: gone Sleeping: flat/one pillow SABA use: none 02: none  Pain radiates back to  lateral thigh and just below the knee / no worse with coughing/ no weakness or numbness  rec Call Dr Lorin Mercy about your R back and leg pain We will call to set you up for bone density   DEXA req 06/18/2019   R femur -1.7 , fx risk moderate > rec fosfamax 70 mg q week  / vit d 800 / calcium 1500 mg daily     05/30/2020  f/u ov/Alexis [name] office/Alexis [name] re: osteo/ back and leg pain f/b Alexis [name]  Chief Complaint  Patient presents with   Follow-up    No complaints currently  Dyspnea:  No aerobics / steps are ok  Cough: ? Worse on fosfamax / assoc with hoarseness  Sleeping: flat bed/ one pillow  Got ? Memory loss p 2nd covid so won't take more shots regardless of the data  Rec We will call you to arrange for shots for your osteoporosis > did not happen as of 07/17/2021      07/17/2021  f/u ov/Alexis [name]  office/Alexis [name] re: hyperlipidemia / osteo maint on Pravachol 40 mg daily   Chief Complaint  Patient presents with   Follow-up    No concerns at this time.   Dyspnea:  no regular aerobics/ able to do house cleaning  Cough: still some cough/ hoarseness  Sleeping: on a flat bed SABA use: none 02: none  Covid status: vax x 2 - thinks 2nd caused memory loss      No obvious day to day or daytime variability or assoc excess/ purulent sputum or mucus plugs or hemoptysis or cp or chest tightness, subjective wheeze or overt sinus or hb symptoms.   Sleeping  without nocturnal  or early am exacerbation  of respiratory  c/o's or need for noct saba. Also denies any obvious fluctuation of symptoms with weather or environmental changes or other aggravating or alleviating factors except as outlined above   No unusual exposure hx or h/o childhood pna/ asthma or knowledge of premature birth.  Current Allergies, Complete Past Medical History, Past Surgical History, Family History, and Social History were reviewed in [employer] record.  ROS  The following are not active complaints unless bolded Hoarseness, sore throat, dysphagia, dental problems, itching, sneezing,  nasal congestion or discharge of excess mucus or purulent secretions, ear ache,   fever, chills, sweats, unintended wt loss or wt gain, classically pleuritic or exertional cp,  orthopnea pnd or arm/hand swelling  or leg swelling, presyncope, palpitations, abdominal pain, anorexia, nausea, vomiting, diarrhea  or change in bowel habits or change in bladder habits, change in stools or change in urine, dysuria, hematuria,  rash, arthralgias, visual complaints, headache, numbness, weakness or ataxia or problems with walking or coordination,  change in mood or  memory.        Current Meds  Medication Sig   aspirin EC 325 MG tablet Take 1 tablet (325 mg total) by mouth daily. MUST TAKE AT LEAST 4 WEEKS POSTOP FOR DVT PROPHYLAXIS.    B Complex-C (SUPER B COMPLEX/VITAMIN C PO) Take 1 tablet by mouth every evening.   chlorpheniramine (CHLOR-TRIMETON) 4 MG tablet Take 4 mg by mouth at bedtime.   Cholecalciferol (VITAMIN D3) 50 MCG (2000 UT) TABS Take 2,000 Units by mouth in the morning.   docusate sodium (COLACE) 100 MG capsule Take 100 mg by mouth in the morning and at bedtime.   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 Alexis [name] - R hip pain rad to thigh onset 02/2019  HEALTH MAINTENANCE.......................................................Marland Kitchen  [facility] - DT 04/21/2015 - Prevnar  04/23/16  - CPX  05/30/2020  - GYN care [name] > says told no longer needed any gyn health maint as per pt at  [facility] 04/30/2017        Family History:  Diabetes- Brothers  Hyperlipidemia- Mother  MI/Heart Attack- Father  Breast cancer  mother and father's sister     Social History:  Patient never smoked.  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    NECK :  without JVD/Nodes/TM/ nl carotid upstrokes bilaterally   LUNGS: no acc muscle use,  Nl contour chest which is clear to A and P bilaterally without cough on insp or exp maneuvers   CV:  RRR  no s3 or murmur or increase in P2, and no edema   ABD:  soft and nontender with nl inspiratory excursion in the supine position. No bruits or organomegaly appreciated, bowel sounds nl  MS:  Nl gait/ ext warm without deformities, calf tenderness, cyanosis or clubbing No obvious joint restrictions   SKIN: warm and dry without lesions    NEURO:  alert, approp, nl sensorium with  no motor or cerebellar deficits apparent.           Labs ordered/ reviewed:      Chemistry      Component Value Date/Time   NA 140 07/17/2021 1127   NA 144 12/26/2016 1105   K 4.1 07/17/2021 1127   K 4.0 12/26/2016 1105   CL 102 07/17/2021 1127   CO2 27 07/17/2021 1127   CO2 29 12/26/2016 1105   BUN 13 07/17/2021 1127   BUN 12.2 12/26/2016 1105   CREATININE 0.73 07/17/2021 1127   CREATININE 0.72 02/03/2019  1157   CREATININE 0.7 12/26/2016 1105      Component Value Date/Time   CALCIUM 9.1 07/17/2021 1127   CALCIUM 9.7 12/26/2016 1105   ALKPHOS 44 07/17/2021 1127   ALKPHOS 49 12/26/2016 1105   AST 34 07/17/2021 1127   AST 26 02/03/2019 1157   AST 22 12/26/2016 1105   ALT 24 07/17/2021 1127   ALT 20 02/03/2019 1157   ALT 17 12/26/2016 1105   BILITOT 0.7  07/17/2021 1127   BILITOT 0.5 02/03/2019 1157   BILITOT 0.57 12/26/2016 1105        Lab Results  Component Value Date   WBC 4.8 07/17/2021   HGB 11.6 07/17/2021   HCT 35.0 07/17/2021   MCV 89 07/17/2021   PLT 152 07/17/2021       EOS                                                               0.3                                   07/17/2021   No results found for: DDIMER    Lab Results  Component Value Date   TSH 1.930 07/17/2021            Assessment & Plan:

## 2021-07-18 ENCOUNTER — Encounter: Payer: Self-pay | Admitting: Internal Medicine

## 2021-07-18 LAB — CBC WITH DIFFERENTIAL/PLATELET
Basophils Absolute: 0 10*3/uL (ref 0.0–0.2)
Basos: 1 %
EOS (ABSOLUTE): 0.3 10*3/uL (ref 0.0–0.4)
Eos: 7 %
Hematocrit: 35 % (ref 34.0–46.6)
Hemoglobin: 11.6 g/dL (ref 11.1–15.9)
Immature Grans (Abs): 0 10*3/uL (ref 0.0–0.1)
Immature Granulocytes: 0 %
Lymphocytes Absolute: 1.4 10*3/uL (ref 0.7–3.1)
Lymphs: 29 %
MCH: 29.5 pg (ref 26.6–33.0)
MCHC: 33.1 g/dL (ref 31.5–35.7)
MCV: 89 fL (ref 79–97)
Monocytes Absolute: 0.5 10*3/uL (ref 0.1–0.9)
Monocytes: 11 %
Neutrophils Absolute: 2.6 10*3/uL (ref 1.4–7.0)
Neutrophils: 52 %
Platelets: 152 10*3/uL (ref 150–450)
RBC: 3.93 x10E6/uL (ref 3.77–5.28)
RDW: 13.1 % (ref 11.7–15.4)
WBC: 4.8 10*3/uL (ref 3.4–10.8)

## 2021-07-18 LAB — LIPID PANEL
Chol/HDL Ratio: 3.5 ratio (ref 0.0–4.4)
Cholesterol, Total: 164 mg/dL (ref 100–199)
HDL: 47 mg/dL (ref >39)
LDL Chol Calc (NIH): 93 mg/dL (ref 0–99)
Triglycerides: 135 mg/dL (ref 0–149)
VLDL Cholesterol Cal: 24 mg/dL (ref 5–40)

## 2021-07-18 LAB — BASIC METABOLIC PANEL
BUN/Creatinine Ratio: 18 (ref 12–28)
BUN: 13 mg/dL (ref 8–27)
CO2: 27 mmol/L (ref 20–29)
Calcium: 9.1 mg/dL (ref 8.7–10.3)
Chloride: 102 mmol/L (ref 96–106)
Creatinine, Ser: 0.73 mg/dL (ref 0.57–1.00)
Glucose: 94 mg/dL (ref 70–99)
Potassium: 4.1 mmol/L (ref 3.5–5.2)
Sodium: 140 mmol/L (ref 134–144)
eGFR: 88 mL/min/{1.73_m2} (ref >59)

## 2021-07-18 LAB — HEPATIC FUNCTION PANEL
ALT: 24 IU/L (ref 0–32)
AST: 34 IU/L (ref 0–40)
Albumin: 4.3 g/dL (ref 3.7–4.7)
Alkaline Phosphatase: 44 IU/L (ref 44–121)
Bilirubin Total: 0.7 mg/dL (ref 0.0–1.2)
Bilirubin, Direct: 0.17 mg/dL (ref 0.00–0.40)
Total Protein: 6.2 g/dL (ref 6.0–8.5)

## 2021-07-18 LAB — TSH: TSH: 1.93 u[IU]/mL (ref 0.450–4.500)

## 2021-07-18 NOTE — Assessment & Plan Note (Signed)
Improved s specific rx.

## 2021-07-18 NOTE — Assessment & Plan Note (Signed)
Onset in her early 53s Allergy profile 04/30/2017 >  Eos 0.1 /  IgE 5 with RAST pos dog only  - 06/06/2018 rec 1st gen H1 blockers per guidelines     Eos 0.3 noted > refer to ENT first due to hoarseness and then to allergy prn          Each maintenance medication was reviewed in detail including emphasizing most importantly the difference between maintenance and prns and under what circumstances the prns are to be triggered using an action plan format where appropriate.  Total time for H and P, chart review, counseling,  and generating customized AVS unique to this summary final office visit / same day charting = 33 min

## 2021-07-18 NOTE — Assessment & Plan Note (Signed)
-   DEXA 07/04/06 Tspine .03, Left hip -.94, L fem Neck -1.4  - DEXA 07/12/08 Tspine -.2 Left F -.7 Right Fem -.4  - DEXA 08/15/10  T spine - 0.3, LF -0.8  RF -0.8  - DEXA 04/16/14 T score is -0.9 at the femoral neck- in the normal range Dual hip measurement is -1.6 - in the osteopenia range Spine score may be falsely elevated due to scoliosis  - DEXA req 06/18/2019   R femur -1.7 , fx risk moderate > rec fosfamax 70 mg q week  / vit d 800 / calcium 1500 mg daily  - 05/30/2020 reported worse hoarseness/ dry cough on fosamax > rec prolia shots instead but did not start them as of 07/17/2021    Advised again to avoid oral biphosphonates due to suspected gerd

## 2021-07-18 NOTE — Assessment & Plan Note (Addendum)
Target LDL < 160 in absence of risk factors  Lab Results  Component Value Date   CHOL 164 07/17/2021   HDL 47 07/17/2021   LDLCALC 93 07/17/2021   LDLDIRECT 151.4 07/04/2010   TRIG 135 07/17/2021   CHOLHDL 3.5 07/17/2021     lfts ok so Adequate control on present rx, reviewed in detail with pt > no change in rx needed  = pravachol 40 mg daily   [facility] [place]

## 2021-07-18 NOTE — Assessment & Plan Note (Signed)
H/o fe def  1999, recurred early 2017 while donating blood - rx NuIron  07/29/15 >>>  Resolved  - recurrent microcytic anemia 04/30/2017  > resolved     Lab Results  Component Value Date   HGB 11.6 07/17/2021   HGB 9.5 (L) 02/07/2021   HGB 9.7 (L) 02/04/2021   HGB 11.5 (L) 01/19/2021   HGB 12.2 01/21/2019   HGB 11.7 12/26/2016   HGB 11.7 02/22/2016   HGB 11.6 11/23/2015     Adequate control on present rx, MCV nl /  reviewed in detail with pt > no change in rx needed

## 2021-08-06 ENCOUNTER — Other Ambulatory Visit: Payer: Self-pay | Admitting: Internal Medicine

## 2021-08-16 ENCOUNTER — Other Ambulatory Visit: Payer: Self-pay

## 2021-08-16 ENCOUNTER — Ambulatory Visit (INDEPENDENT_AMBULATORY_CARE_PROVIDER_SITE_OTHER): Payer: Medicare Other | Admitting: Internal Medicine

## 2021-08-16 ENCOUNTER — Encounter: Payer: Self-pay | Admitting: Internal Medicine

## 2021-08-16 VITALS — BP 136/88 | HR 65 | Resp 18 | Ht 64.0 in | Wt 111.6 lb

## 2021-08-16 DIAGNOSIS — K219 Gastro-esophageal reflux disease without esophagitis: Secondary | ICD-10-CM | POA: Diagnosis not present

## 2021-08-16 DIAGNOSIS — Z2821 Immunization not carried out because of patient refusal: Secondary | ICD-10-CM

## 2021-08-16 DIAGNOSIS — E782 Mixed hyperlipidemia: Secondary | ICD-10-CM

## 2021-08-16 DIAGNOSIS — C50411 Malignant neoplasm of upper-outer quadrant of right female breast: Secondary | ICD-10-CM | POA: Diagnosis not present

## 2021-08-16 DIAGNOSIS — Z96643 Presence of artificial hip joint, bilateral: Secondary | ICD-10-CM | POA: Diagnosis not present

## 2021-08-16 DIAGNOSIS — E785 Hyperlipidemia, unspecified: Secondary | ICD-10-CM | POA: Insufficient documentation

## 2021-08-16 DIAGNOSIS — Z170 Estrogen receptor positive status [ER+]: Secondary | ICD-10-CM | POA: Diagnosis not present

## 2021-08-16 MED ORDER — OMEPRAZOLE 40 MG PO CPDR: 40.0000 mg | DELAYED_RELEASE_CAPSULE | Freq: Every day | ORAL | 5 refills | Status: DC

## 2021-08-16 NOTE — Patient Instructions (Addendum)
Please start taking Omeprazole as prescribed. ? ?Please continue taking medications as prescribed. ? ?Please continue to follow low cholesterol diet and ambulate as tolerated. ? ?Your thyroid function test was normal in January. ?

## 2021-08-16 NOTE — Assessment & Plan Note (Signed)
On pravastatin ?Lipid profile reviewed ?

## 2021-08-16 NOTE — Assessment & Plan Note (Signed)
Her hoarse voice concern could be related to GERD ?Trial of PPI ?
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
## 2021-08-16 NOTE — Assessment & Plan Note (Signed)
S/p right lumpectomy and radiation ?On tamoxifen ?Followed by oncology ?
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
## 2021-10-18 DIAGNOSIS — J3489 Other specified disorders of nose and nasal sinuses: Secondary | ICD-10-CM | POA: Diagnosis not present

## 2021-10-18 DIAGNOSIS — R490 Dysphonia: Secondary | ICD-10-CM | POA: Diagnosis not present

## 2021-10-18 DIAGNOSIS — J342 Deviated nasal septum: Secondary | ICD-10-CM | POA: Diagnosis not present

## 2021-11-03 DIAGNOSIS — N950 Postmenopausal bleeding: Secondary | ICD-10-CM | POA: Diagnosis not present

## 2021-11-03 DIAGNOSIS — N840 Polyp of corpus uteri: Secondary | ICD-10-CM | POA: Diagnosis not present

## 2021-11-21 ENCOUNTER — Ambulatory Visit: Payer: Medicare Other

## 2021-11-27 ENCOUNTER — Ambulatory Visit: Payer: Medicare Other

## 2021-12-04 ENCOUNTER — Ambulatory Visit
Admission: EM | Admit: 2021-12-04 | Discharge: 2021-12-04 | Disposition: A | Discharge status: Home / Self Care | Payer: Medicare Other | Attending: Nurse Practitioner | Admitting: Nurse Practitioner

## 2021-12-04 DIAGNOSIS — R21 Rash and other nonspecific skin eruption: Secondary | ICD-10-CM | POA: Diagnosis not present

## 2021-12-04 MED ORDER — ACYCLOVIR 800 MG PO TABS: 800.0000 mg | ORAL_TABLET | Freq: Every day | ORAL | 0 refills | Status: AC

## 2021-12-04 NOTE — Discharge Instructions (Addendum)
Take medication as prescribed. As discussed, when the rash is oozing, you are most contagious.  Keep the area covered until it begins to dry. Do not scratch, rub, or irritate the affected area. May take Benadryl at bedtime to help with itching. May take Tylenol as needed for pain or discomfort. Cool compresses to the area to help with discomfort. Follow-up with your primary care physician if symptoms do not improve.

## 2021-12-04 NOTE — ED Provider Notes (Signed)
[facility]    CSN: [id number] Arrival date & time: 12/04/21  3267      History   Chief Complaint Chief Complaint  Patient presents with   Rash    HPI Alexis [name] is a 71 y.o. female.   The history is provided by the patient.    Patient presents with a rash to her left flank and torso area that has been present for the past 2 days.  She states the area started around her bellybutton.  She states that the next day she woke up with a rash on her back that extends to her left abdomen.  She states that her sister called her and told her that she had shingles.  Patient states over the past 24 hours, rash has had to her left back, and is now blistering.  She has not received the shingles vaccine.  She denies fever, chills, abdominal pain, nausea, vomiting, exposure to new soaps, new detergents, lotions, foods, or medications.  She has not been taking any medication for symptoms.  Patient declines the use of prednisone for her symptoms.  Past Medical History:  Diagnosis Date   Allergy    Arthritis    bilateral hips   Asthma    environmental now resolved   Breast cancer ([place]) 11/16/2015   Right Breast   Chronic fatigue    Cough    Family history of breast cancer    Family history of lung cancer    GERD (gastroesophageal reflux disease)    Heart murmur    "years ago, when I was young"   Hemangioma    History of hiatal hernia    Hyperlipidemia    IBS (irritable bowel syndrome)    Iron deficiency anemia    Kyphosis of thoracic region    MVP (mitral valve prolapse)    Osteopenia    Personal history of radiation therapy    SAH (subarachnoid hemorrhage) ([place])    Vertigo     Patient Active Problem List   Diagnosis Date Noted   HLD (hyperlipidemia) 08/16/2021   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/09/2021   Genetic testing 02/09/2019   Family history of breast cancer    Family history of lung cancer    Upper airway cough syndrome 04/30/2017   Palpitations  04/30/2017   Chronic bilateral low back pain 02/28/2017   Malignant neoplasm of upper-outer quadrant of right breast in female, estrogen receptor positive ([place]) 11/17/2015   Microcytic anemia 04/22/2015   Premature atrial contractions 06/24/2009   HEMANGIOMA 05/02/2007   SAH  h/o 2003 05/02/2007   GERD 05/02/2007   IRRITABLE BOWEL SYNDROME 05/02/2007   KYPHOSCOLIOSIS, 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    Past Surgical History:  Procedure Laterality Date   APPENDECTOMY     BREAST BIOPSY Right 11/16/2015   U/S Core- Malignant   BREAST BIOPSY Left 06/21/2005   Stereo- Benign   BREAST EXCISIONAL BIOPSY Left 2007   BREAST LUMPECTOMY Right 12/22/2015   BREAST LUMPECTOMY WITH RADIOACTIVE SEED AND SENTINEL LYMPH NODE BIOPSY Right 12/22/2015   Procedure: BREAST LUMPECTOMY WITH RADIOACTIVE SEED AND SENTINEL LYMPH NODE BIOPSY;  Surgeon: Alphonsa Overall, MD;  Location: [facility];  Service: General;  Laterality: Right;   COLONOSCOPY     FOOT SURGERY Right    bunionectomy   MOUTH SURGERY     [facility]  2003   s/p embolization per Dr. Estanislado Pandy   TONSILLECTOMY     TOTAL HIP ARTHROPLASTY Left 01/11/2017   Procedure: LEFT  TOTAL HIP ARTHROPLASTY ANTERIOR APPROACH;  Surgeon: [name], MD;  Location: [facility];  Service: Orthopedics;  Laterality: Left;   TOTAL HIP ARTHROPLASTY Right 02/03/2021   Procedure: RIGHT TOTAL HIP ARTHROPLASTY ANTERIOR APPROACH;  Surgeon: [name], MD;  Location: [facility];  Service: Orthopedics;  Laterality: Right;   TUBAL LIGATION     UPPER GASTROINTESTINAL ENDOSCOPY      OB History   No obstetric history on file.      Home Medications    Prior to Admission medications   Medication Sig Start Date End Date Taking? Authorizing Provider  acyclovir (ZOVIRAX) 800 MG tablet Take 1 tablet (800 mg total) by mouth 5 (five) times daily for 7 days. 12/04/21 12/11/21 Yes Drucilla Cumber-Warren, Alda Lea, NP  aspirin EC 325 MG tablet Take 1 tablet (325 mg total) by mouth daily. MUST  TAKE AT LEAST 4 WEEKS POSTOP FOR DVT PROPHYLAXIS. 02/03/21   Lanae Crumbly, PA-C  B Complex-C (SUPER B COMPLEX/VITAMIN C PO) Take 1 tablet by mouth every evening.    [provider]  chlorpheniramine (CHLOR-TRIMETON) 4 MG tablet Take 4 mg by mouth at bedtime.    [provider]  Cholecalciferol (VITAMIN D3) 50 MCG (2000 UT) TABS Take 2,000 Units by mouth in the morning.    [provider]  docusate sodium (COLACE) 100 MG capsule Take 100 mg by mouth in the morning and at bedtime.    [provider]  omeprazole (PRILOSEC) 40 MG capsule Take 1 capsule (40 mg total) by mouth daily. 08/16/21   Lindell Spar, MD  pravastatin (PRAVACHOL) 40 MG tablet TAKE 1 TABLET BY MOUTH EVERY DAY IN THE EVENING 08/07/21   Tanda Rockers, MD  tamoxifen (NOLVADEX) 20 MG tablet TAKE 1 TABLET BY MOUTH EVERYDAY AT BEDTIME 01/19/21   Magrinat, Virgie Dad, MD    Family History Family History  Problem Relation Age of Onset   Hyperlipidemia Mother    Breast cancer Mother 13   Heart attack Father    Diabetes Brother    Breast cancer Maternal Aunt        diagnosed 12s or 78s   Breast cancer Paternal Aunt 80       diagnosed in her 63s   Lung cancer Paternal Uncle    Colon cancer Neg Hx    Colon polyps Neg Hx    Esophageal cancer Neg Hx    Rectal cancer Neg Hx    Stomach cancer Neg Hx     Social History Social History   Tobacco Use   Smoking status: Never   Smokeless tobacco: Never  Vaping Use   Vaping Use: Never used  Substance Use Topics   Alcohol use: No   Drug use: No     Allergies   Other and Latex   Review of Systems Review of Systems Per HPI  Physical Exam Triage Vital Signs ED Triage Vitals  Enc Vitals Group     BP 12/04/21 0852 (!) 146/77     Pulse Rate 12/04/21 0852 60     Resp 12/04/21 0852 20     Temp 12/04/21 0852 97.7 F (36.5 C)     Temp src --      SpO2 12/04/21 0852 95 %     Weight --      Height --      Head Circumference --      Peak  Flow --      Pain Score 12/04/21 0851 1  Pain Loc --      Pain Edu? --      Excl. in [place]? --    No data found.  Updated Vital Signs BP (!) 146/77   Pulse 60   Temp 97.7 F (36.5 C)   Resp 20   SpO2 95%   Visual Acuity Right Eye Distance:   Left Eye Distance:   Bilateral Distance:    Right Eye Near:   Left Eye Near:    Bilateral Near:     Physical Exam Vitals and nursing note reviewed.  Constitutional:      Appearance: Normal appearance.  Cardiovascular:     Rate and Rhythm: Regular rhythm.  Pulmonary:     Effort: Pulmonary effort is normal.     Breath sounds: Normal breath sounds.  Abdominal:     General: Bowel sounds are normal.     Palpations: Abdomen is soft.  Skin:    General: Skin is warm and dry.     Findings: Rash present. Rash is pustular, urticarial and vesicular.     Comments: Rash is located on left torso extending to left flank and abdomen  Neurological:     General: No focal deficit present.     Mental Status: She is alert and oriented to person, place, and time.  Psychiatric:        Mood and Affect: Mood normal.        Behavior: Behavior normal.      UC Treatments / Results  Labs (all labs ordered are listed, but only abnormal results are displayed) Labs Reviewed - No data to display  EKG   Radiology No results found.  Procedures Procedures (including critical care time)  Medications Ordered in UC Medications - No data to display  Initial Impression / Assessment and Plan / UC Course  I have reviewed the triage vital signs and the nursing notes.  Pertinent labs & imaging results that were available during my care of the patient were reviewed by me and considered in my medical decision making (see chart for details).  Patient presents for rash to the left torso, flank, and abdomen that started 2 days ago.  On exam, patient's rash is localized to the dermatomes of her left torso, flank, and abdomen.  Area is vesicular and  blistering.  Symptoms are consistent with shingles.  We will start patient on acyclovir for her symptoms.  Patient declines use of prednisone at this time.  Supportive care recommendations were provided to the patient.  Patient advised to follow-up with her PCP if her symptoms do not improve or if they worsen. Final Clinical Impressions(s) / UC Diagnoses   Final diagnoses:  Rash and nonspecific skin eruption     Discharge Instructions      Take medication as prescribed. As discussed, when the rash is oozing, you are most contagious.  Keep the area covered until it begins to dry. Do not scratch, rub, or irritate the affected area. May take Benadryl at bedtime to help with itching. May take Tylenol as needed for pain or discomfort. Cool compresses to the area to help with discomfort. Follow-up with your primary care physician if symptoms do not improve.     ED Prescriptions     Medication Sig Dispense Auth. Provider   acyclovir (ZOVIRAX) 800 MG tablet Take 1 tablet (800 mg total) by mouth 5 (five) times daily for 7 days. 35 tablet Joshuah Minella-Warren, Alda Lea, NP      PDMP not reviewed this encounter.  Tish Men, NP 12/04/21 [phone]

## 2021-12-04 NOTE — ED Triage Notes (Signed)
Pt presents with rash on flank and torso area for past few days, believes to be shingles

## 2021-12-20 ENCOUNTER — Ambulatory Visit
Admission: EM | Admit: 2021-12-20 | Discharge: 2021-12-20 | Disposition: A | Discharge status: Home / Self Care | Payer: Medicare Other | Attending: Family Medicine | Admitting: Family Medicine

## 2021-12-20 DIAGNOSIS — J342 Deviated nasal septum: Secondary | ICD-10-CM | POA: Diagnosis not present

## 2021-12-20 DIAGNOSIS — K219 Gastro-esophageal reflux disease without esophagitis: Secondary | ICD-10-CM | POA: Diagnosis not present

## 2021-12-20 DIAGNOSIS — B029 Zoster without complications: Secondary | ICD-10-CM | POA: Diagnosis not present

## 2021-12-20 DIAGNOSIS — R490 Dysphonia: Secondary | ICD-10-CM | POA: Diagnosis not present

## 2021-12-20 MED ORDER — TRIAMCINOLONE ACETONIDE 0.1 % EX CREA: 1.0000 | TOPICAL_CREAM | Freq: Two times a day (BID) | CUTANEOUS | 0 refills | Status: DC

## 2021-12-20 MED ORDER — VALACYCLOVIR HCL 1 G PO TABS: 1000.0000 mg | ORAL_TABLET | Freq: Three times a day (TID) | ORAL | 0 refills | Status: AC

## 2021-12-20 NOTE — ED Triage Notes (Signed)
Pt reports itching rash in the back due to shingles x 5 weeks. Pt finished shingles treatment 2 weeks ago.

## 2021-12-20 NOTE — ED Provider Notes (Signed)
[facility]    CSN: [id number] Arrival date & time: 12/20/21  1547      History   Chief Complaint Chief Complaint  Patient presents with   Herpes Zoster    HPI Alexis [name] is a 71 y.o. female.   Presenting today with an itchy rash to the left flank that has been present for about 5 weeks now.  Finished a course of acyclovir early on in course for shingles, feels like the rash started drying up on this but never went away and she is still having itching and stinging pain.  Has not been trying anything over-the-counter since completion of the acyclovir.    Past Medical History:  Diagnosis Date   Allergy    Arthritis    bilateral hips   Asthma    environmental now resolved   Breast cancer ([place]) 11/16/2015   Right Breast   Chronic fatigue    Cough    Family history of breast cancer    Family history of lung cancer    GERD (gastroesophageal reflux disease)    Heart murmur    "years ago, when I was young"   Hemangioma    History of hiatal hernia    Hyperlipidemia    IBS (irritable bowel syndrome)    Iron deficiency anemia    Kyphosis of thoracic region    MVP (mitral valve prolapse)    Osteopenia    Personal history of radiation therapy    SAH (subarachnoid hemorrhage) ([place])    Vertigo     Patient Active Problem List   Diagnosis Date Noted   HLD (hyperlipidemia) 08/16/2021   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/09/2021   Genetic testing 02/09/2019   Family history of breast cancer    Family history of lung cancer    Upper airway cough syndrome 04/30/2017   Palpitations 04/30/2017   Chronic bilateral low back pain 02/28/2017   Malignant neoplasm of upper-outer quadrant of right breast in female, estrogen receptor positive ([place]) 11/17/2015   Microcytic anemia 04/22/2015   Premature atrial contractions 06/24/2009   HEMANGIOMA 05/02/2007   SAH  h/o 2003 05/02/2007   GERD 05/02/2007   IRRITABLE BOWEL SYNDROME 05/02/2007   KYPHOSCOLIOSIS, THORACIC SPINE  05/02/2007    Past Surgical History:  Procedure Laterality Date   APPENDECTOMY     BREAST BIOPSY Right 11/16/2015   U/S Core- Malignant   BREAST BIOPSY Left 06/21/2005   Stereo- Benign   BREAST EXCISIONAL BIOPSY Left 2007   BREAST LUMPECTOMY Right 12/22/2015   BREAST LUMPECTOMY WITH RADIOACTIVE SEED AND SENTINEL LYMPH NODE BIOPSY Right 12/22/2015   Procedure: BREAST LUMPECTOMY WITH RADIOACTIVE SEED AND SENTINEL LYMPH NODE BIOPSY;  Surgeon: Alphonsa Overall, MD;  Location: [facility];  Service: General;  Laterality: Right;   COLONOSCOPY     FOOT SURGERY Right    bunionectomy   MOUTH SURGERY     [facility]  2003   s/p embolization per Dr. Estanislado Pandy   TONSILLECTOMY     TOTAL HIP ARTHROPLASTY Left 01/11/2017   Procedure: LEFT TOTAL HIP ARTHROPLASTY ANTERIOR APPROACH;  Surgeon: [name], MD;  Location: [facility];  Service: Orthopedics;  Laterality: Left;   TOTAL HIP ARTHROPLASTY Right 02/03/2021   Procedure: RIGHT TOTAL HIP ARTHROPLASTY ANTERIOR APPROACH;  Surgeon: [name], MD;  Location: [facility];  Service: Orthopedics;  Laterality: Right;   TUBAL LIGATION     UPPER GASTROINTESTINAL ENDOSCOPY      OB History   No obstetric  history on file.      Home Medications    Prior to Admission medications   Medication Sig Start Date End Date Taking? Authorizing Provider  triamcinolone cream (KENALOG) 0.1 % Apply 1 Application topically 2 (two) times daily. 12/20/21  Yes Volney American, PA-C  valACYclovir (VALTREX) 1000 MG tablet Take 1 tablet (1,000 mg total) by mouth 3 (three) times daily for 7 days. 12/20/21 12/27/21 Yes Volney American, PA-C  aspirin EC 325 MG tablet Take 1 tablet (325 mg total) by mouth daily. MUST TAKE AT LEAST 4 WEEKS POSTOP FOR DVT PROPHYLAXIS. 02/03/21   Lanae Crumbly, PA-C  B Complex-C (SUPER B COMPLEX/VITAMIN C PO) Take 1 tablet by mouth every evening.    [provider]  chlorpheniramine (CHLOR-TRIMETON) 4 MG tablet Take 4 mg by mouth at  bedtime.    [provider]  Cholecalciferol (VITAMIN D3) 50 MCG (2000 UT) TABS Take 2,000 Units by mouth in the morning.    [provider]  docusate sodium (COLACE) 100 MG capsule Take 100 mg by mouth in the morning and at bedtime.    [provider]  omeprazole (PRILOSEC) 40 MG capsule Take 1 capsule (40 mg total) by mouth daily. 08/16/21   Lindell Spar, MD  pravastatin (PRAVACHOL) 40 MG tablet TAKE 1 TABLET BY MOUTH EVERY DAY IN THE EVENING 08/07/21   Tanda Rockers, MD  tamoxifen (NOLVADEX) 20 MG tablet TAKE 1 TABLET BY MOUTH EVERYDAY AT BEDTIME 01/19/21   Magrinat, Virgie Dad, MD    Family History Family History  Problem Relation Age of Onset   Hyperlipidemia Mother    Breast cancer Mother 29   Heart attack Father    Diabetes Brother    Breast cancer Maternal Aunt        diagnosed 53s or 64s   Breast cancer Paternal Aunt 4       diagnosed in her 38s   Lung cancer Paternal Uncle    Colon cancer Neg Hx    Colon polyps Neg Hx    Esophageal cancer Neg Hx    Rectal cancer Neg Hx    Stomach cancer Neg Hx     Social History Social History   Tobacco Use   Smoking status: Never   Smokeless tobacco: Never  Vaping Use   Vaping Use: Never used  Substance Use Topics   Alcohol use: No   Drug use: No     Allergies   Other and Latex   Review of Systems Review of Systems Per HPI  Physical Exam Triage Vital Signs ED Triage Vitals  Enc Vitals Group     BP 12/20/21 1552 (!) 146/73     Pulse Rate 12/20/21 1552 67     Resp 12/20/21 1552 16     Temp 12/20/21 1552 98.3 F (36.8 C)     Temp Source 12/20/21 1552 Oral     SpO2 12/20/21 1552 95 %     Weight --      Height --      Head Circumference --      Peak Flow --      Pain Score 12/20/21 1555 0     Pain Loc --      Pain Edu? --      Excl. in [place]? --    No data found.  Updated Vital Signs BP (!) 146/73 (BP Location: Right Arm)   Pulse 67   Temp 98.3 F (36.8 C) (Oral)  Resp 16    SpO2 95%   Visual Acuity Right Eye Distance:   Left Eye Distance:   Bilateral Distance:    Right Eye Near:   Left Eye Near:    Bilateral Near:     Physical Exam Vitals and nursing note reviewed.  Constitutional:      Appearance: Normal appearance. She is not ill-appearing.  HENT:     Head: Atraumatic.  Eyes:     Extraocular Movements: Extraocular movements intact.     Conjunctiva/sclera: Conjunctivae normal.  Cardiovascular:     Rate and Rhythm: Normal rate and regular rhythm.     Heart sounds: Normal heart sounds.  Pulmonary:     Effort: Pulmonary effort is normal.     Breath sounds: Normal breath sounds.  Musculoskeletal:        General: Normal range of motion.     Cervical back: Normal range of motion and neck supple.  Skin:    General: Skin is warm.     Findings: Rash present.     Comments: Erythematous scabbed rash present left flank from midline of the spine extending to lateral left side.  Neurological:     Mental Status: She is alert and oriented to person, place, and time.  Psychiatric:        Mood and Affect: Mood normal.        Thought Content: Thought content normal.        Judgment: Judgment normal.      UC Treatments / Results  Labs (all labs ordered are listed, but only abnormal results are displayed) Labs Reviewed - No data to display  EKG   Radiology No results found.  Procedures Procedures (including critical care time)  Medications Ordered in UC Medications - No data to display  Initial Impression / Assessment and Plan / UC Course  I have reviewed the triage vital signs and the nursing notes.  Pertinent labs & imaging results that were available during my care of the patient were reviewed by me and considered in my medical decision making (see chart for details).     Rash appears to be drying up and scabbing, however still very symptomatic.  She states she is not tolerant to oral steroids or injectable steroids, trial triamcinolone  cream, another round of antivirals.  Return for worsening symptoms.  Final Clinical Impressions(s) / UC Diagnoses   Final diagnoses:  Herpes zoster without complication   Discharge Instructions   None    ED Prescriptions     Medication Sig Dispense Auth. Provider   valACYclovir (VALTREX) 1000 MG tablet Take 1 tablet (1,000 mg total) by mouth 3 (three) times daily for 7 days. 21 tablet Volney American, [place]   triamcinolone cream (KENALOG) 0.1 % Apply 1 Application topically 2 (two) times daily. 60 g Volney American, [place]      PDMP not reviewed this encounter.   Volney American, [place] 12/20/21 1635

## 2021-12-27 DIAGNOSIS — B0229 Other postherpetic nervous system involvement: Secondary | ICD-10-CM | POA: Diagnosis not present

## 2021-12-27 DIAGNOSIS — Z853 Personal history of malignant neoplasm of breast: Secondary | ICD-10-CM | POA: Diagnosis not present

## 2021-12-27 DIAGNOSIS — B028 Zoster with other complications: Secondary | ICD-10-CM | POA: Diagnosis not present

## 2021-12-27 DIAGNOSIS — K219 Gastro-esophageal reflux disease without esophagitis: Secondary | ICD-10-CM | POA: Diagnosis not present

## 2021-12-27 DIAGNOSIS — Z1231 Encounter for screening mammogram for malignant neoplasm of breast: Secondary | ICD-10-CM | POA: Diagnosis not present

## 2021-12-27 DIAGNOSIS — I499 Cardiac arrhythmia, unspecified: Secondary | ICD-10-CM | POA: Diagnosis not present

## 2021-12-28 ENCOUNTER — Other Ambulatory Visit (HOSPITAL_COMMUNITY): Payer: Self-pay | Admitting: Adult Health Nurse Practitioner

## 2021-12-28 DIAGNOSIS — Z1231 Encounter for screening mammogram for malignant neoplasm of breast: Secondary | ICD-10-CM

## 2022-01-03 ENCOUNTER — Ambulatory Visit (HOSPITAL_COMMUNITY)
Admission: RE | Admit: 2022-01-03 | Discharge: 2022-01-03 | Disposition: A | Discharge status: Home / Self Care | Payer: Medicare Other | Source: Ambulatory Visit | Attending: Adult Health Nurse Practitioner | Admitting: Adult Health Nurse Practitioner

## 2022-01-03 DIAGNOSIS — Z1231 Encounter for screening mammogram for malignant neoplasm of breast: Secondary | ICD-10-CM | POA: Diagnosis not present

## 2022-01-10 ENCOUNTER — Other Ambulatory Visit (HOSPITAL_COMMUNITY): Payer: Self-pay | Admitting: *Deleted

## 2022-01-10 MED ORDER — TAMOXIFEN CITRATE 20 MG PO TABS: ORAL_TABLET | ORAL | 0 refills | Status: DC

## 2022-01-10 NOTE — Telephone Encounter (Signed)
Patient will be new to our clinic as of 01/19/22.  Will send full script for Tamoxifen that day if she is to continue on therapy.  Refill sent for 10 tablets today.

## 2022-01-25 ENCOUNTER — Other Ambulatory Visit (HOSPITAL_COMMUNITY)
Admission: RE | Admit: 2022-01-25 | Discharge: 2022-01-25 | Disposition: A | Discharge status: Home / Self Care | Payer: Medicare Other | Source: Ambulatory Visit | Attending: Hematology | Admitting: Hematology

## 2022-01-25 ENCOUNTER — Inpatient Hospital Stay: Payer: Medicare Other | Attending: Hematology | Admitting: Hematology

## 2022-01-25 ENCOUNTER — Other Ambulatory Visit: Payer: Self-pay

## 2022-01-25 ENCOUNTER — Inpatient Hospital Stay: Payer: Medicare Other | Attending: Adult Health Nurse Practitioner

## 2022-01-25 VITALS — BP 176/91 | HR 62 | Temp 97.6°F | Resp 16 | Ht 62.25 in | Wt 115.8 lb

## 2022-01-25 DIAGNOSIS — Z170 Estrogen receptor positive status [ER+]: Secondary | ICD-10-CM | POA: Diagnosis not present

## 2022-01-25 DIAGNOSIS — Z801 Family history of malignant neoplasm of trachea, bronchus and lung: Secondary | ICD-10-CM | POA: Diagnosis not present

## 2022-01-25 DIAGNOSIS — Z923 Personal history of irradiation: Secondary | ICD-10-CM | POA: Insufficient documentation

## 2022-01-25 DIAGNOSIS — M8580 Other specified disorders of bone density and structure, unspecified site: Secondary | ICD-10-CM | POA: Insufficient documentation

## 2022-01-25 DIAGNOSIS — Z79899 Other long term (current) drug therapy: Secondary | ICD-10-CM | POA: Diagnosis not present

## 2022-01-25 DIAGNOSIS — Z1231 Encounter for screening mammogram for malignant neoplasm of breast: Secondary | ICD-10-CM

## 2022-01-25 DIAGNOSIS — C50411 Malignant neoplasm of upper-outer quadrant of right female breast: Secondary | ICD-10-CM | POA: Insufficient documentation

## 2022-01-25 DIAGNOSIS — Z79810 Long term (current) use of selective estrogen receptor modulators (SERMs): Secondary | ICD-10-CM | POA: Diagnosis not present

## 2022-01-25 DIAGNOSIS — E559 Vitamin D deficiency, unspecified: Secondary | ICD-10-CM

## 2022-01-25 DIAGNOSIS — Z803 Family history of malignant neoplasm of breast: Secondary | ICD-10-CM | POA: Insufficient documentation

## 2022-01-25 LAB — VITAMIN D 25 HYDROXY (VIT D DEFICIENCY, FRACTURES): Vit D, 25-Hydroxy: 62.16 ng/mL (ref 30–100)

## 2022-01-25 MED ORDER — TAMOXIFEN CITRATE 20 MG PO TABS: ORAL_TABLET | ORAL | 4 refills | Status: DC

## 2022-01-25 NOTE — Patient Instructions (Addendum)
[place] at [facility] Discharge Instructions  You were seen and examined today by Dr. Delton Coombes. Dr. Delton Coombes will continue the care that you received previously from Dr. Jana Hakim.  Dr. Delton Coombes would like to check your Vitamin D level today.  Your last mammogram was normal, your next mammogram is due in July 2024. Continue taking Tamoxifen as prescribed.  Follow-up after your 2024 mammogram.   Thank you for choosing [place] at [facility] to provide your oncology and hematology care.  To afford each patient quality time with our provider, please arrive at least 15 minutes before your scheduled appointment time.   If you have a lab appointment with the [facility] please come in thru the Main Entrance and check in at the main information desk.  You need to re-schedule your appointment should you arrive 10 or more minutes late.  We strive to give you quality time with our providers, and arriving late affects you and other patients whose appointments are after yours.  Also, if you no show three or more times for appointments you may be dismissed from the clinic at the providers discretion.     Again, thank you for choosing [facility].  Our hope is that these requests will decrease the amount of time that you wait before being seen by our physicians.       _____________________________________________________________  Should you have questions after your visit to [facility], please contact our office at [phone] and follow the prompts.  Our office hours are 8:00 a.m. and 4:30 p.m. Monday - Friday.  Please note that voicemails left after 4:00 p.m. may not be returned until the following business day.  We are closed weekends and major holidays.  You do have access to a nurse 24-7, just call the main number to the clinic [phone] and do not press any options, hold on the line and a nurse will answer the  phone.    For prescription refill requests, have your pharmacy contact our office and allow 72 hours.

## 2022-01-25 NOTE — Progress Notes (Signed)
[facility] NOTE  Patient Care Team: Pablo Lawrence, NP as PCP - General (Adult Health Nurse Practitioner) Alphonsa Overall, MD as Consulting Physician (General Surgery) Kyung Rudd, MD as Consulting Physician (Radiation Oncology) Allyn Kenner, MD (Dermatology) Regal, Tamala Fothergill, DPM as Consulting Physician (Podiatry) Derek Jack, MD as Medical Oncologist (Medical Oncology)  CHIEF COMPLAINTS/PURPOSE OF CONSULTATION:  Stage I right breast cancer  HISTORY OF PRESENTING ILLNESS:  Alexis [name] 71 y.o. female is seen in consultation today at the request of Dr. Jana Hakim for continued management of stage I right breast cancer.  She was initially diagnosed with right breast cancer in May 2017, underwent right lumpectomy in July 2017.  She underwent radiation and completed in September 2017 followed by tamoxifen which was started in October 2017.  She reports that she is tolerating tamoxifen very well.  No vaginal discharge or spotting noted.  No major hot flashes.  No new onset pains.  She is taking vitamin D 2000 units daily.  No prior history of DVT/MI/CVA.  I reviewed her records extensively and collaborated the history with the patient.  SUMMARY OF ONCOLOGIC HISTORY: Oncology History  Malignant neoplasm of upper-outer quadrant of right breast in female, estrogen receptor positive ([place])  11/16/2015 Breast US   Targeted ultrasound is performed, showing an irregular, hypoechoic mass at 12 o'clock, 1 cm from the nipple measuring 1.2 x 0.8 x 0.9 cm, corresponding to the area of concern within the superior right breast.    11/16/2015 Initial Biopsy   (R) breast needle biopsy (12:00): IDC with calcs, DCIS with calcs, grade 1. ER+ (100%), PR-, Ki67 5%, HER2 neg (ratio 1.00).    11/17/2015 Initial Diagnosis   Breast cancer of upper-outer quadrant of right female breast ([place])   12/22/2015 Surgery   (R) lumpectomy with SLNB Lucia Gaskins): IDC, spanning 1.1 cm, DCIS with calcs,  tumor focally <0.1 cm from anterior margin. 0/1 SLN.   pT1c, pN0: Stage IA    12/22/2015 Oncotype testing   Recurrence score: 21; ROR 14% (intermediate risk)    02/06/2016 - 03/05/2016 Radiation Therapy   Adjuvant breast radiation Lisbeth Renshaw). Right breast: 42.5 Gy in 17 fx. Right breast boost: 7.5 Gy in 3 fx   03/2016 -  Anti-estrogen oral therapy   Tamoxifen 20 mg daily. Planned duration of therapy: 5 years   02/09/2019 Genetic Testing   Negative genetic testing on the [facility] Common Hereditary Cancers panel.  The Common Hereditary Cancers Panel offered by [facility] includes sequencing and/or deletion duplication testing of the following 48 genes: APC, ATM, AXIN2, BARD1, BMPR1A, BRCA1, BRCA2, BRIP1, CDH1, CDK4, CDKN2A (p14ARF), CDKN2A (p16INK4a), CHEK2, CTNNA1, DICER1, EPCAM (Deletion/duplication testing only), GREM1 (promoter region deletion/duplication testing only), KIT, MEN1, MLH1, MSH2, MSH3, MSH6, MUTYH, NBN, NF1, NHTL1, PALB2, PDGFRA, PMS2, POLD1, POLE, PTEN, RAD50, RAD51C, RAD51D, RNF43, SDHB, SDHC, SDHD, SMAD4, SMARCA4. STK11, TP53, TSC1, TSC2, and VHL.  The following genes were evaluated for sequence changes only: SDHA and HOXB13 c.251G>A variant only.       MEDICAL HISTORY:  Past Medical History:  Diagnosis Date   Allergy    Arthritis    bilateral hips   Asthma    environmental now resolved   Breast cancer ([place]) 11/16/2015   Right Breast   Chronic fatigue    Cough    Family history of breast cancer    Family history of lung cancer    GERD (gastroesophageal reflux disease)    Heart murmur    "years ago, when I  was young"   Hemangioma    History of hiatal hernia    Hyperlipidemia    IBS (irritable bowel syndrome)    Iron deficiency anemia    Kyphosis of thoracic region    MVP (mitral valve prolapse)    Osteopenia    Personal history of radiation therapy    SAH (subarachnoid hemorrhage) ([place])    Vertigo     SURGICAL HISTORY: Past Surgical History:  Procedure  Laterality Date   APPENDECTOMY     BREAST BIOPSY Right 11/16/2015   U/S Core- Malignant   BREAST BIOPSY Left 06/21/2005   Stereo- Benign   BREAST EXCISIONAL BIOPSY Left 2007   BREAST LUMPECTOMY Right 12/22/2015   BREAST LUMPECTOMY WITH RADIOACTIVE SEED AND SENTINEL LYMPH NODE BIOPSY Right 12/22/2015   Procedure: BREAST LUMPECTOMY WITH RADIOACTIVE SEED AND SENTINEL LYMPH NODE BIOPSY;  Surgeon: Alphonsa Overall, MD;  Location: [place];  Service: General;  Laterality: Right;   COLONOSCOPY     FOOT SURGERY Right    bunionectomy   MOUTH SURGERY     [facility]  2003   s/p embolization per Dr. Estanislado Pandy   TONSILLECTOMY     TOTAL HIP ARTHROPLASTY Left 01/11/2017   Procedure: LEFT TOTAL HIP ARTHROPLASTY ANTERIOR APPROACH;  Surgeon: [name], MD;  Location: [place];  Service: Orthopedics;  Laterality: Left;   TOTAL HIP ARTHROPLASTY Right 02/03/2021   Procedure: RIGHT TOTAL HIP ARTHROPLASTY ANTERIOR APPROACH;  Surgeon: [name], MD;  Location: [place];  Service: Orthopedics;  Laterality: Right;   TUBAL LIGATION     UPPER GASTROINTESTINAL ENDOSCOPY      SOCIAL HISTORY: Social History   Socioeconomic History   Marital status: Married    Spouse name: Not on file   Number of children: Not on file   Years of education: Not on file   Highest education level: Not on file  Occupational History   Occupation: CSR  Tobacco Use   Smoking status: Never   Smokeless tobacco: Never  Vaping Use   Vaping Use: Never used  Substance and Sexual Activity   Alcohol use: No   Drug use: No   Sexual activity: Not on file  Other Topics Concern   Not on file  Social History Narrative   Not on file   Social Determinants of Health   Financial Resource Strain: Not on file  Food Insecurity: Not on file  Transportation Needs: Not on file  Physical Activity: Not on file  Stress: Not on file  Social Connections: Not on file  Intimate Partner Violence: Not on file    FAMILY HISTORY: Family  History  Problem Relation Age of Onset   Hyperlipidemia Mother    Breast cancer Mother 28   Heart attack Father    Diabetes Brother    Breast cancer Maternal Aunt        diagnosed 8s or 42s   Breast cancer Paternal Aunt 53       diagnosed in her 23s   Lung cancer Paternal Uncle    Colon cancer Neg Hx    Colon polyps Neg Hx    Esophageal cancer Neg Hx    Rectal cancer Neg Hx    Stomach cancer Neg Hx     ALLERGIES:  is allergic to other and latex.  MEDICATIONS:  Current Outpatient Medications  Medication Sig Dispense Refill   aspirin EC 325 MG tablet Take 1 tablet (325 mg total) by mouth daily. MUST TAKE AT LEAST 4 WEEKS  POSTOP FOR DVT PROPHYLAXIS. 30 tablet 0   B Complex-C (SUPER B COMPLEX/VITAMIN C PO) Take 1 tablet by mouth every evening.     chlorpheniramine (CHLOR-TRIMETON) 4 MG tablet Take 4 mg by mouth at bedtime.     Cholecalciferol (VITAMIN D3) 50 MCG (2000 UT) TABS Take 2,000 Units by mouth in the morning.     docusate sodium (COLACE) 100 MG capsule Take 100 mg by mouth in the morning and at bedtime.     omeprazole (PRILOSEC) 20 MG capsule Take 20 mg by mouth daily.     pravastatin (PRAVACHOL) 40 MG tablet TAKE 1 TABLET BY MOUTH EVERY DAY IN THE EVENING 90 tablet 3   tamoxifen (NOLVADEX) 20 MG tablet TAKE 1 TABLET BY MOUTH EVERYDAY AT BEDTIME 10 tablet 0   triamcinolone cream (KENALOG) 0.1 % Apply 1 Application topically 2 (two) times daily. 60 g 0   No current facility-administered medications for this visit.    REVIEW OF SYSTEMS:   Constitutional: Denies fevers, chills or abnormal night sweats Eyes: Denies blurriness of vision, double vision or watery eyes Ears, nose, mouth, throat, and face: Denies mucositis or sore throat Respiratory: Denies cough, dyspnea or wheezes Cardiovascular: Denies palpitation, chest discomfort or lower extremity swelling Gastrointestinal:  Denies nausea, heartburn or change in bowel habits Skin: Denies abnormal skin rashes Lymphatics:  Denies new lymphadenopathy or easy bruising Neurological:Denies numbness, tingling or new weaknesses Behavioral/Psych: Mood is stable, no new changes  Breast:  Denies any palpable lumps or discharge All other systems were reviewed with the patient and are negative.  PHYSICAL EXAMINATION: ECOG PERFORMANCE STATUS: 1 - Symptomatic but completely ambulatory  Vitals:   01/25/22 1324  BP: (!) 176/91  Pulse: 62  Resp: 16  Temp: 97.6 F (36.4 C)  SpO2: 96%   Filed Weights   01/25/22 1324  Weight: 115 lb 12.8 oz (52.5 kg)    GENERAL:alert, no distress and comfortable SKIN: skin color, texture, turgor are normal, no rashes or significant lesions EYES: normal, conjunctiva are pink and non-injected, sclera clear OROPHARYNX:no exudate, no erythema and lips, buccal mucosa, and tongue normal  NECK: supple, thyroid normal size, non-tender, without nodularity LYMPH:  no palpable lymphadenopathy in the cervical, axillary or inguinal LUNGS: clear to auscultation and percussion with normal breathing effort HEART: regular rate & rhythm and no murmurs and no lower extremity edema ABDOMEN:abdomen soft, non-tender and normal bowel sounds Musculoskeletal:no cyanosis of digits and no clubbing  PSYCH: alert & oriented x 3 with fluent speech NEURO: no focal motor/sensory deficits BREAST: No palpable nodules in breast. No palpable axillary or supraclavicular lymphadenopathy (exam performed in the presence of a chaperone)   LABORATORY DATA:  I have reviewed the data as listed Lab Results  Component Value Date   WBC 4.8 07/17/2021   HGB 11.6 07/17/2021   HCT 35.0 07/17/2021   MCV 89 07/17/2021   PLT 152 07/17/2021   Lab Results  Component Value Date   NA 140 07/17/2021   K 4.1 07/17/2021   CL 102 07/17/2021   CO2 27 07/17/2021    RADIOGRAPHIC STUDIES: I have personally reviewed the radiological reports and agreed with the findings in the report.  ASSESSMENT:  1.  Stage I (T1 cN0 G1)  right breast cancer, ER positive, PR/HER2 negative: - Right breast 12:00 biopsy (11/16/2015): IDC with calcifications, ER 100%, PR 0%, Ki-67 5%, HER2 negative by FISH - Right lumpectomy and SLNB (12/22/2015): 1.1 cm invasive ductal carcinoma, tumor focally less than 0.1 cm  from anterior margin, 0/1 lymph node involved, grade 1, Ki-67 5% - Oncotype DX: Recurrence score 21 with 10-year risk of distant recurrence 14% with tamoxifen alone - [facility] testing negative - Adjuvant XRT from 02/06/2016 through 03/05/2016 - Tamoxifen started in October 2017, to be continued total 10 years  2.  Social/family history: - Lives at home with her husband.  Non-smoker. - She worked at [employer] and had chlorine exposure.  She worked for [employer] for 28 years. - Mother had breast cancer in her 57s.    PLAN:  1.  Stage I right breast cancer, ER positive: - We reviewed her mammogram dated 01/03/2022, BI-RADS Category 1. - Physical examination today did not reveal any suspicious masses or lymphadenopathy. - She will continue tamoxifen for total of 10 years.  We have given refills. - RTC 1 year for follow-up with repeat mammogram and labs.  2.  Bone health: - DEXA scan on 06/18/2019 with T-score -0.6 - Continue vitamin D 2000 units daily. - Recommend checking vitamin D today and at 1 year visit.    All questions were answered. The patient knows to call the clinic with any problems, questions or concerns.    Derek Jack, MD 01/25/22

## 2022-02-20 ENCOUNTER — Ambulatory Visit: Payer: Medicare Other | Admitting: Internal Medicine

## 2022-02-21 ENCOUNTER — Ambulatory Visit: Payer: Medicare Other | Attending: Cardiovascular Disease | Admitting: Cardiovascular Disease

## 2022-02-21 ENCOUNTER — Encounter: Payer: Self-pay | Admitting: Cardiovascular Disease

## 2022-02-21 VITALS — BP 170/84 | HR 69 | Ht 63.0 in | Wt 111.4 lb

## 2022-02-21 DIAGNOSIS — I341 Nonrheumatic mitral (valve) prolapse: Secondary | ICD-10-CM | POA: Diagnosis not present

## 2022-02-21 NOTE — Patient Instructions (Signed)
Medication Instructions:  No changes *If you need a refill on your cardiac medications before your next appointment, please call your pharmacy*   Lab Work: None ordered If you have labs (blood work) drawn today and your tests are completely normal, you will receive your results only by: [place] (if you have MyChart) OR A paper copy in the mail If you have any lab test that is abnormal or we need to change your treatment, we will call you to review the results.   Testing/Procedures: Your physician has requested that you have an echocardiogram. Echocardiography is a painless test that uses sound waves to create images of your heart. It provides your doctor with information about the size and shape of your heart and how well your heart's chambers and valves are working. You may receive an ultrasound enhancing agent through an IV if needed to better visualize your heart during the echo.This procedure takes approximately one hour. There are no restrictions for this procedure. This will take place at the [address], [address].     Follow-Up: At [facility], you and your health needs are our priority.  As part of our continuing mission to provide you with exceptional heart care, we have created designated Provider Care Teams.  These Care Teams include your primary Cardiologist (physician) and Advanced Practice Providers (APPs -  Physician Assistants and Nurse Practitioners) who all work together to provide you with the care you need, when you need it.  We recommend signing up for the patient portal called "MyChart".  Sign up information is provided on this After Visit Summary.  MyChart is used to connect with patients for Virtual Visits (Telemedicine).  Patients are able to view lab/test results, encounter notes, upcoming appointments, etc.  Non-urgent messages can be sent to your provider as well.   To learn more about what you can do with MyChart, go to  [email or web address].    Your next appointment:   12 month(s)  The format for your next appointment:   In Person  Provider:   Sanda Klein, MD     Other Instructions Dr. Sallyanne Kuster would like you to check your blood pressure daily for the next 2 weeks.  Keep a journal of these daily blood pressure and heart rate readings and call our office or send a message through [facility] with the results. Thank you!  It is best to check your BP 1-2 hours after taking your medications to see the medications effectiveness on your BP.    Here are some tips that our clinical pharmacists share for home BP monitoring:          Rest 10 minutes before taking your blood pressure.          Don't smoke or drink caffeinated beverages for at least 30 minutes before.          Take your blood pressure before (not after) you eat.          Sit comfortably with your back supported and both feet on the floor (don't cross your legs).          Elevate your arm to heart level on a table or a desk.          Use the proper sized cuff. It should fit smoothly and snugly around your bare upper arm. There should be enough room to slip a fingertip under the cuff. The bottom edge of the cuff should be 1 inch above the crease of the  elbow.

## 2022-02-24 ENCOUNTER — Encounter: Payer: Self-pay | Admitting: Cardiovascular Disease

## 2022-02-24 NOTE — Progress Notes (Signed)
Cardiology Office Note:    Date:  02/24/2022   ID:  Alexis [name], DOB [birth year], MRN [id number]  PCP:  Alexis Lawrence, NP   [place] Cardiologist:  Alexis Klein, MD     Referring MD: Alexis Lawrence, NP   No chief complaint on file. Alexis [name] is a 71 y.o. female who is being seen today for the evaluation of arrhythmia at the request of Alexis Lawrence, NP.   History of Present Illness:    Alexis [name] is a 71 y.o. female with a hx of mitral valve disease (she has been told in the past that she has had rheumatic fever, but also there is a diagnosis of mitral valve prolapse), noted to have a "very irregular" rhythm during a recent follow-up with her primary care provider for shingles.  She has been told before that her rhythm is frequently irregular.  Reportedly her ECG showed "long pauses with PVCs", but the tracing is not currently available for review.  Palpitations do not bother her frequently.  Years ago she had very rapid palpitations, but currently she has infrequent fluttering in her chest.  She denies syncope, shortness of breath at rest and with activity, chest pain at rest or with activity, lower extremity edema, orthopnea, PND, focal neurological complaints or intermittent claudication.  She has some problems with hoarseness and is being treated for laryngopharyngeal reflux.  She has a remote history of breast cancer, more than 15 years ago.  She reports previous work-up with echocardiography, but this was more than 20 years ago and those records are not available.  A nuclear stress test was performed in 2007, showed normal perfusion and LVEF.  The diagnosis of "MVP" is listed on that report.  In 2011 she wore an arrhythmia monitor for 24 hours.  She had rare PVCs and rare PACs and atrial couplets but also 33 brief episodes of SVT with a maximum of 12 beats.  There was no evidence of atrial fibrillation or ventricular tachycardia.  Airlie's husband  Alexis [name]) is also my patient.  Past Medical History:  Diagnosis Date   Allergy    Arthritis    bilateral hips   Asthma    environmental now resolved   Breast cancer ([place]) 11/16/2015   Right Breast   Chronic fatigue    Cough    Family history of breast cancer    Family history of lung cancer    GERD (gastroesophageal reflux disease)    Heart murmur    "years ago, when I was young"   Hemangioma    History of hiatal hernia    Hyperlipidemia    IBS (irritable bowel syndrome)    Iron deficiency anemia    Kyphosis of thoracic region    MVP (mitral valve prolapse)    Osteopenia    Personal history of radiation therapy    SAH (subarachnoid hemorrhage) (HCC)    Vertigo     Past Surgical History:  Procedure Laterality Date   APPENDECTOMY     BREAST BIOPSY Right 11/16/2015   U/S Core- Malignant   BREAST BIOPSY Left 06/21/2005   Stereo- Benign   BREAST EXCISIONAL BIOPSY Left 2007   BREAST LUMPECTOMY Right 12/22/2015   BREAST LUMPECTOMY WITH RADIOACTIVE SEED AND SENTINEL LYMPH NODE BIOPSY Right 12/22/2015   Procedure: BREAST LUMPECTOMY WITH RADIOACTIVE SEED AND SENTINEL LYMPH NODE BIOPSY;  Surgeon: Alexis Overall, MD;  Location: [facility];  Service: General;  Laterality: Right;   COLONOSCOPY  FOOT SURGERY Right    bunionectomy   MOUTH SURGERY     [facility]  2003   s/p embolization per Dr. Estanislado [name]   TONSILLECTOMY     TOTAL HIP ARTHROPLASTY Left 01/11/2017   Procedure: LEFT TOTAL HIP ARTHROPLASTY ANTERIOR APPROACH;  Surgeon: Alexis Killings, MD;  Location: [facility];  Service: Orthopedics;  Laterality: Left;   TOTAL HIP ARTHROPLASTY Right 02/03/2021   Procedure: RIGHT TOTAL HIP ARTHROPLASTY ANTERIOR APPROACH;  Surgeon: Alexis Killings, MD;  Location: [facility];  Service: Orthopedics;  Laterality: Right;   TUBAL LIGATION     UPPER GASTROINTESTINAL ENDOSCOPY      Current Medications: Current Meds  Medication Sig   aspirin EC 325 MG tablet Take 1 tablet (325 mg total) by mouth  daily. MUST TAKE AT LEAST 4 WEEKS POSTOP FOR DVT PROPHYLAXIS.   B Complex-C (SUPER B COMPLEX/VITAMIN C PO) Take 1 tablet by mouth every evening.   chlorpheniramine (CHLOR-TRIMETON) 4 MG tablet Take 4 mg by mouth at bedtime.   Cholecalciferol (VITAMIN D3) 50 MCG (2000 UT) TABS Take 2,000 Units by mouth in the morning.   docusate sodium (COLACE) 100 MG capsule Take 100 mg by mouth in the morning and at bedtime.   omeprazole (PRILOSEC) 20 MG capsule Take 20 mg by mouth daily.   pravastatin (PRAVACHOL) 40 MG tablet TAKE 1 TABLET BY MOUTH EVERY DAY IN THE EVENING   tamoxifen (NOLVADEX) 20 MG tablet TAKE 1 TABLET BY MOUTH EVERYDAY AT BEDTIME     Allergies:   Other and Latex   Social History   Socioeconomic History   Marital status: Married    Spouse name: Not on file   Number of children: Not on file   Years of education: Not on file   Highest education level: Not on file  Occupational History   Occupation: CSR  Tobacco Use   Smoking status: Never   Smokeless tobacco: Never  Vaping Use   Vaping Use: Never used  Substance and Sexual Activity   Alcohol use: No   Drug use: No   Sexual activity: Not on file  Other Topics Concern   Not on file  Social History Narrative   Not on file   Social Determinants of Health   Financial Resource Strain: Not on file  Food Insecurity: Not on file  Transportation Needs: Not on file  Physical Activity: Not on file  Stress: Not on file  Social Connections: Not on file     Family History: The patient's family history includes Breast cancer in her maternal aunt; Breast cancer (age of onset: 60) in her paternal aunt; Breast cancer (age of onset: 29) in her mother; Diabetes in her brother; Heart attack in her father; Hyperlipidemia in her mother; Lung cancer in her paternal uncle. There is no history of Colon cancer, Colon polyps, Esophageal cancer, Rectal cancer, or Stomach cancer.  ROS:   Please see the history of present illness.     All other  systems reviewed and are negative.  EKGs/Labs/Other Studies Reviewed:    The following studies were reviewed today: Holter monitor report 2011, nuclear stress test report 2007  EKG:  EKG is ordered today.  The ekg ordered today demonstrates sinus rhythm with a single atrial couplet.  On physical exam there were frequent ectopic beats.  Recent Labs: 07/17/2021: ALT 24; BUN 13; Creatinine, Ser 0.73; Hemoglobin 11.6; Platelets 152; Potassium 4.1; Sodium 140; TSH 1.930  Recent Lipid Panel    Component Value Date/Time  CHOL 164 07/17/2021 1127   TRIG 135 07/17/2021 1127   TRIG 113 05/24/2006 0950   HDL 47 07/17/2021 1127   CHOLHDL 3.5 07/17/2021 1127   CHOLHDL 3.3 05/30/2020 1108   VLDL 25 05/30/2020 1108   LDLCALC 93 07/17/2021 1127   LDLDIRECT 151.4 07/04/2010 0853     Risk Assessment/Calculations:     Initial BP was high.  Recheck BP was similar.  However in her primary care provider's office just a few days ago the blood pressure was 131/71.  Suspect situational hypertension.  No changes were made to her medications at this time.  Physical Exam:    VS:  BP (!) 170/84 (BP Location: Left Arm, Patient Position: Sitting, Cuff Size: Small)   Pulse 69   Ht '5\' 3"'$  (1.6 m)   Wt 111 lb 6.4 oz (50.5 kg)   SpO2 96%   BMI 19.73 kg/m    Recheck BP 160/80 mmHg Wt Readings from Last 3 Encounters:  02/21/22 111 lb 6.4 oz (50.5 kg)  01/25/22 115 lb 12.8 oz (52.5 kg)  08/16/21 111 lb 9.6 oz (50.6 kg)     GEN: Very lean, appears well.  Well nourished, well developed in no acute distress HEENT: Normal NECK: No JVD; No carotid bruits LYMPHATICS: No lymphadenopathy CARDIAC: RRR with frequent ectopy, normal S1 and S2 without systolic clicks, there is a faint 1/6 holosystolic murmur at the apex, no diastolic murmurs, rubs, gallops RESPIRATORY:  Clear to auscultation without rales, wheezing or rhonchi.  Rash of healing herpes zoster is seen on her lower posterior left thorax ABDOMEN: Soft,  non-tender, non-distended MUSCULOSKELETAL:  No edema; No deformity  SKIN: Warm and dry NEUROLOGIC:  Alert and oriented x 3 PSYCHIATRIC:  Normal affect   ASSESSMENT:    1. Mitral valve prolapse    PLAN:    In order of problems listed above:  MVP: Her ECG was performed more than 20 years ago and we discussed the fact that with loose criteria for diagnosis and MVP, this entity was over diagnosed frequently.  We will obtain repeat confirmation with an echocardiogram which also let us evaluate atrial sizes and estimate propensity for future atrial arrhythmia. Palpitations: Over 10 years ago the only meaningful arrhythmia on her Holter monitor was occasional nonsustained atrial tachycardia with a maximum of 12 beats.  Recheck this.  At risk for atrial fibrillation due to her age and possibly mitral valve disease.  Identifying atrial fibrillation would lead to a recommendation for anticoagulants.  Note that her records describe a history of subarachnoid hemorrhage in 2003.  We did not discuss that today.  We will have to review the details before recommending anticoagulants. Elevated BP: This appears to be situational.  At PCP appointment last month her blood pressure was 131/71. HLP: On pravastatin.  No known history of significant PAD or CAD.  Normal remote nuclear stress test in 2017.  Target LDL less than 100 appears acceptable.           Medication Adjustments/Labs and Tests Ordered: Current medicines are reviewed at length with the patient today.  Concerns regarding medicines are outlined above.  Orders Placed This Encounter  Procedures   EKG 12-Lead   ECHOCARDIOGRAM COMPLETE   No orders of the defined types were placed in this encounter.   Patient Instructions  Medication Instructions:  No changes *If you need a refill on your cardiac medications before your next appointment, please call your pharmacy*   Lab Work: None ordered If you have  labs (blood work) drawn today and  your tests are completely normal, you will receive your results only by: [facility] (if you have MyChart) OR A paper copy in the mail If you have any lab test that is abnormal or we need to change your treatment, we will call you to review the results.   Testing/Procedures: Your physician has requested that you have an echocardiogram. Echocardiography is a painless test that uses sound waves to create images of your heart. It provides your doctor with information about the size and shape of your heart and how well your heart's chambers and valves are working. You may receive an ultrasound enhancing agent through an IV if needed to better visualize your heart during the echo.This procedure takes approximately one hour. There are no restrictions for this procedure. This will take place at the [address].     Follow-Up: At [facility], you and your health needs are our priority.  As part of our continuing mission to provide you with exceptional heart care, we have created designated Provider Care Teams.  These Care Teams include your primary Cardiologist (physician) and Advanced Practice Providers (APPs -  Physician Assistants and Nurse Practitioners) who all work together to provide you with the care you need, when you need it.  We recommend signing up for the patient portal called "MyChart".  Sign up information is provided on this After Visit Summary.  MyChart is used to connect with patients for Virtual Visits (Telemedicine).  Patients are able to view lab/test results, encounter notes, upcoming appointments, etc.  Non-urgent messages can be sent to your provider as well.   To learn more about what you can do with MyChart, go to [email or web address].    Your next appointment:   12 month(s)  The format for your next appointment:   In Person  Provider:   Sanda Klein, MD     Other Instructions Dr. Sallyanne Kuster would like you to check your blood pressure  daily for the next 2 weeks.  Keep a journal of these daily blood pressure and heart rate readings and call our office or send a message through [facility] with the results. Thank you!  It is best to check your BP 1-2 hours after taking your medications to see the medications effectiveness on your BP.    Here are some tips that our clinical pharmacists share for home BP monitoring:          Rest 10 minutes before taking your blood pressure.          Don't smoke or drink caffeinated beverages for at least 30 minutes before.          Take your blood pressure before (not after) you eat.          Sit comfortably with your back supported and both feet on the floor (don't cross your legs).          Elevate your arm to heart level on a table or a desk.          Use the proper sized cuff. It should fit smoothly and snugly around your bare upper arm. There should be enough room to slip a fingertip under the cuff. The bottom edge of the cuff should be 1 inch above the crease of the elbow.      Signed, Alexis Klein, MD  02/24/2022 2:32 PM    [place]

## 2022-03-01 ENCOUNTER — Ambulatory Visit (HOSPITAL_COMMUNITY): Payer: Medicare Other | Attending: Cardiology

## 2022-03-01 DIAGNOSIS — I341 Nonrheumatic mitral (valve) prolapse: Secondary | ICD-10-CM | POA: Insufficient documentation

## 2022-03-01 LAB — ECHOCARDIOGRAM COMPLETE
Area-P 1/2: 3.48 cm2
S' Lateral: 2.8 cm

## 2022-03-07 ENCOUNTER — Encounter: Payer: Self-pay | Admitting: Cardiovascular Disease

## 2022-04-04 DIAGNOSIS — Z01419 Encounter for gynecological examination (general) (routine) without abnormal findings: Secondary | ICD-10-CM | POA: Diagnosis not present

## 2022-04-04 DIAGNOSIS — N840 Polyp of corpus uteri: Secondary | ICD-10-CM | POA: Diagnosis not present

## 2022-06-05 DIAGNOSIS — E559 Vitamin D deficiency, unspecified: Secondary | ICD-10-CM | POA: Diagnosis not present

## 2022-06-05 DIAGNOSIS — E782 Mixed hyperlipidemia: Secondary | ICD-10-CM | POA: Diagnosis not present

## 2022-06-05 DIAGNOSIS — R7309 Other abnormal glucose: Secondary | ICD-10-CM | POA: Diagnosis not present

## 2022-06-05 DIAGNOSIS — D509 Iron deficiency anemia, unspecified: Secondary | ICD-10-CM | POA: Diagnosis not present

## 2022-06-05 DIAGNOSIS — Z0000 Encounter for general adult medical examination without abnormal findings: Secondary | ICD-10-CM | POA: Diagnosis not present

## 2022-06-05 DIAGNOSIS — L658 Other specified nonscarring hair loss: Secondary | ICD-10-CM | POA: Diagnosis not present

## 2022-07-28 ENCOUNTER — Other Ambulatory Visit: Payer: Self-pay | Admitting: Internal Medicine

## 2022-10-26 ENCOUNTER — Other Ambulatory Visit: Payer: Self-pay | Admitting: Internal Medicine

## 2022-12-10 DIAGNOSIS — B9689 Other specified bacterial agents as the cause of diseases classified elsewhere: Secondary | ICD-10-CM | POA: Diagnosis not present

## 2022-12-10 DIAGNOSIS — L0202 Furuncle of face: Secondary | ICD-10-CM | POA: Diagnosis not present

## 2022-12-10 DIAGNOSIS — D225 Melanocytic nevi of trunk: Secondary | ICD-10-CM | POA: Diagnosis not present

## 2022-12-10 DIAGNOSIS — Z1283 Encounter for screening for malignant neoplasm of skin: Secondary | ICD-10-CM | POA: Diagnosis not present

## 2022-12-10 DIAGNOSIS — L820 Inflamed seborrheic keratosis: Secondary | ICD-10-CM | POA: Diagnosis not present

## 2023-01-02 DIAGNOSIS — H2513 Age-related nuclear cataract, bilateral: Secondary | ICD-10-CM | POA: Diagnosis not present

## 2023-01-07 ENCOUNTER — Ambulatory Visit (HOSPITAL_COMMUNITY)
Admission: RE | Admit: 2023-01-07 | Discharge: 2023-01-07 | Disposition: A | Discharge status: Home / Self Care | Payer: Medicare Other | Source: Ambulatory Visit | Attending: Hematology | Admitting: Hematology

## 2023-01-07 ENCOUNTER — Inpatient Hospital Stay: Payer: Medicare Other | Attending: Adult Health Nurse Practitioner

## 2023-01-07 DIAGNOSIS — Z1231 Encounter for screening mammogram for malignant neoplasm of breast: Secondary | ICD-10-CM | POA: Diagnosis not present

## 2023-01-07 DIAGNOSIS — C50411 Malignant neoplasm of upper-outer quadrant of right female breast: Secondary | ICD-10-CM

## 2023-01-07 DIAGNOSIS — Z853 Personal history of malignant neoplasm of breast: Secondary | ICD-10-CM | POA: Diagnosis not present

## 2023-01-07 DIAGNOSIS — Z79810 Long term (current) use of selective estrogen receptor modulators (SERMs): Secondary | ICD-10-CM | POA: Insufficient documentation

## 2023-01-07 DIAGNOSIS — Z923 Personal history of irradiation: Secondary | ICD-10-CM | POA: Insufficient documentation

## 2023-01-07 DIAGNOSIS — E559 Vitamin D deficiency, unspecified: Secondary | ICD-10-CM | POA: Insufficient documentation

## 2023-01-07 DIAGNOSIS — Z170 Estrogen receptor positive status [ER+]: Secondary | ICD-10-CM | POA: Insufficient documentation

## 2023-01-11 ENCOUNTER — Inpatient Hospital Stay: Payer: Medicare Other

## 2023-01-11 DIAGNOSIS — Z923 Personal history of irradiation: Secondary | ICD-10-CM | POA: Diagnosis not present

## 2023-01-11 DIAGNOSIS — E559 Vitamin D deficiency, unspecified: Secondary | ICD-10-CM

## 2023-01-11 DIAGNOSIS — Z170 Estrogen receptor positive status [ER+]: Secondary | ICD-10-CM

## 2023-01-11 DIAGNOSIS — Z79810 Long term (current) use of selective estrogen receptor modulators (SERMs): Secondary | ICD-10-CM | POA: Diagnosis not present

## 2023-01-11 DIAGNOSIS — C50411 Malignant neoplasm of upper-outer quadrant of right female breast: Secondary | ICD-10-CM | POA: Diagnosis not present

## 2023-01-11 LAB — CBC WITH DIFFERENTIAL/PLATELET
Abs Immature Granulocytes: 0.02 10*3/uL (ref 0.00–0.07)
Basophils Absolute: 0 10*3/uL (ref 0.0–0.1)
Basophils Relative: 0 %
Eosinophils Absolute: 0.1 10*3/uL (ref 0.0–0.5)
Eosinophils Relative: 1 %
HCT: 36.9 % (ref 36.0–46.0)
Hemoglobin: 12.1 g/dL (ref 12.0–15.0)
Immature Granulocytes: 0 %
Lymphocytes Relative: 19 %
Lymphs Abs: 1.4 10*3/uL (ref 0.7–4.0)
MCH: 29.5 pg (ref 26.0–34.0)
MCHC: 32.8 g/dL (ref 30.0–36.0)
MCV: 90 fL (ref 80.0–100.0)
Monocytes Absolute: 0.6 10*3/uL (ref 0.1–1.0)
Monocytes Relative: 8 %
Neutro Abs: 5.3 10*3/uL (ref 1.7–7.7)
Neutrophils Relative %: 72 %
Platelets: 204 10*3/uL (ref 150–400)
RBC: 4.1 MIL/uL (ref 3.87–5.11)
RDW: 13 % (ref 11.5–15.5)
WBC: 7.5 10*3/uL (ref 4.0–10.5)
nRBC: 0 % (ref 0.0–0.2)

## 2023-01-11 LAB — COMPREHENSIVE METABOLIC PANEL
ALT: 25 U/L (ref 0–44)
AST: 29 U/L (ref 15–41)
Albumin: 4 g/dL (ref 3.5–5.0)
Alkaline Phosphatase: 35 U/L — ABNORMAL LOW (ref 38–126)
Anion gap: 9 (ref 5–15)
BUN: 18 mg/dL (ref 8–23)
CO2: 26 mmol/L (ref 22–32)
Calcium: 8.8 mg/dL — ABNORMAL LOW (ref 8.9–10.3)
Chloride: 102 mmol/L (ref 98–111)
Creatinine, Ser: 0.78 mg/dL (ref 0.44–1.00)
GFR, Estimated: >60 mL/min (ref >60)
Glucose, Bld: 113 mg/dL — ABNORMAL HIGH (ref 70–99)
Potassium: 3.5 mmol/L (ref 3.5–5.1)
Sodium: 137 mmol/L (ref 135–145)
Total Bilirubin: 1.2 mg/dL (ref 0.3–1.2)
Total Protein: 7 g/dL (ref 6.5–8.1)

## 2023-01-14 ENCOUNTER — Inpatient Hospital Stay: Payer: Medicare Other | Admitting: Hematology

## 2023-01-17 ENCOUNTER — Inpatient Hospital Stay: Payer: Medicare Other | Attending: Hematology | Admitting: Oncology

## 2023-01-17 VITALS — BP 153/82 | HR 64 | Temp 99.0°F | Resp 18 | Wt 111.1 lb

## 2023-01-17 DIAGNOSIS — Z923 Personal history of irradiation: Secondary | ICD-10-CM | POA: Insufficient documentation

## 2023-01-17 DIAGNOSIS — Z79810 Long term (current) use of selective estrogen receptor modulators (SERMs): Secondary | ICD-10-CM | POA: Insufficient documentation

## 2023-01-17 DIAGNOSIS — Z170 Estrogen receptor positive status [ER+]: Secondary | ICD-10-CM | POA: Diagnosis not present

## 2023-01-17 DIAGNOSIS — C50411 Malignant neoplasm of upper-outer quadrant of right female breast: Secondary | ICD-10-CM | POA: Insufficient documentation

## 2023-01-17 NOTE — Progress Notes (Signed)
[facility] CONSULT NOTE  Patient Care Team: Roe Rutherford, NP as PCP - General (Adult Health Nurse Practitioner) Thurmon Fair, MD as PCP - Cardiology (Cardiology) Ovidio Kin, MD as Consulting Physician (General Surgery) Dorothy Puffer, MD as Consulting Physician (Radiation Oncology) Nita Sells, MD (Dermatology) Regal, Kirstie Peri, DPM as Consulting Physician (Podiatry) Doreatha Massed, MD as Medical Oncologist (Medical Oncology)  CHIEF COMPLAINTS/PURPOSE OF CONSULTATION:  Stage I right breast cancer  HISTORY OF PRESENTING ILLNESS:  Alexis [name] 72 y.o. female is seen for follow-up for stage I right breast cancer.    She was initially diagnosed with right breast cancer in May 2017, underwent right lumpectomy in July 2017.  She underwent radiation and completed in September 2017 followed by tamoxifen which was started in October 2017.    She reports that she is tolerating tamoxifen very well.  No vaginal discharge or spotting noted.  No major hot flashes.  No new onset pains.  She is taking vitamin D 2000 units daily.  No prior history of DVT/MI/CVA. No recent hospitalizations.   SUMMARY OF ONCOLOGIC HISTORY: Oncology History  Malignant neoplasm of upper-outer quadrant of right breast in female, estrogen receptor positive (HCC)  11/16/2015 Breast US   Targeted ultrasound is performed, showing an irregular, hypoechoic mass at 12 o'clock, 1 cm from the nipple measuring 1.2 x 0.8 x 0.9 cm, corresponding to the area of concern within the superior right breast.    11/16/2015 Initial Biopsy   (R) breast needle biopsy (12:00): IDC with calcs, DCIS with calcs, grade 1. ER+ (100%), PR-, Ki67 5%, HER2 neg (ratio 1.00).    11/17/2015 Initial Diagnosis   Breast cancer of upper-outer quadrant of right female breast (HCC)   12/22/2015 Surgery   (R) lumpectomy with SLNB Ezzard Standing): IDC, spanning 1.1 cm, DCIS with calcs, tumor focally <0.1 cm from anterior margin. 0/1 SLN.    pT1c, pN0: Stage IA    12/22/2015 Oncotype testing   Recurrence score: 21; ROR 14% (intermediate risk)    02/06/2016 - 03/05/2016 Radiation Therapy   Adjuvant breast radiation Mitzi Hansen). Right breast: 42.5 Gy in 17 fx. Right breast boost: 7.5 Gy in 3 fx   03/2016 -  Anti-estrogen oral therapy   Tamoxifen 20 mg daily. Planned duration of therapy: 5 years   02/09/2019 Genetic Testing   Negative genetic testing on the [facility] Common Hereditary Cancers panel.  The Common Hereditary Cancers Panel offered by [facility] includes sequencing and/or deletion duplication testing of the following 48 genes: APC, ATM, AXIN2, BARD1, BMPR1A, BRCA1, BRCA2, BRIP1, CDH1, CDK4, CDKN2A (p14ARF), CDKN2A (p16INK4a), CHEK2, CTNNA1, DICER1, EPCAM (Deletion/duplication testing only), GREM1 (promoter region deletion/duplication testing only), KIT, MEN1, MLH1, MSH2, MSH3, MSH6, MUTYH, NBN, NF1, NHTL1, PALB2, PDGFRA, PMS2, POLD1, POLE, PTEN, RAD50, RAD51C, RAD51D, RNF43, SDHB, SDHC, SDHD, SMAD4, SMARCA4. STK11, TP53, TSC1, TSC2, and VHL.  The following genes were evaluated for sequence changes only: SDHA and HOXB13 c.251G>A variant only.       MEDICAL HISTORY:  Past Medical History:  Diagnosis Date   Allergy    Arthritis    bilateral hips   Asthma    environmental now resolved   Breast cancer (HCC) 11/16/2015   Right Breast   Chronic fatigue    Cough    Family history of breast cancer    Family history of lung cancer    GERD (gastroesophageal reflux disease)    Heart murmur    "years ago, when I was young"   Hemangioma  History of hiatal hernia    Hyperlipidemia    IBS (irritable bowel syndrome)    Iron deficiency anemia    Kyphosis of thoracic region    MVP (mitral valve prolapse)    Osteopenia    Personal history of radiation therapy    SAH (subarachnoid hemorrhage) (HCC)    Vertigo     SURGICAL HISTORY: Past Surgical History:  Procedure Laterality Date   APPENDECTOMY     BREAST BIOPSY Right  11/16/2015   U/S Core- Malignant   BREAST BIOPSY Left 06/21/2005   Stereo- Benign   BREAST EXCISIONAL BIOPSY Left 2007   BREAST LUMPECTOMY Right 12/22/2015   BREAST LUMPECTOMY WITH RADIOACTIVE SEED AND SENTINEL LYMPH NODE BIOPSY Right 12/22/2015   Procedure: BREAST LUMPECTOMY WITH RADIOACTIVE SEED AND SENTINEL LYMPH NODE BIOPSY;  Surgeon: Ovidio Kin, MD;  Location: [facility];  Service: General;  Laterality: Right;   COLONOSCOPY     FOOT SURGERY Right    bunionectomy   MOUTH SURGERY     [facility]  2003   s/p embolization per Dr. Corliss Skains   TONSILLECTOMY     TOTAL HIP ARTHROPLASTY Left 01/11/2017   Procedure: LEFT TOTAL HIP ARTHROPLASTY ANTERIOR APPROACH;  Surgeon: Eldred Manges, MD;  Location: [facility] OR;  Service: Orthopedics;  Laterality: Left;   TOTAL HIP ARTHROPLASTY Right 02/03/2021   Procedure: RIGHT TOTAL HIP ARTHROPLASTY ANTERIOR APPROACH;  Surgeon: Eldred Manges, MD;  Location: [facility] OR;  Service: Orthopedics;  Laterality: Right;   TUBAL LIGATION     UPPER GASTROINTESTINAL ENDOSCOPY      SOCIAL HISTORY: Social History   Socioeconomic History   Marital status: Married    Spouse name: Not on file   Number of children: Not on file   Years of education: Not on file   Highest education level: Not on file  Occupational History   Occupation: CSR  Tobacco Use   Smoking status: Never   Smokeless tobacco: Never  Vaping Use   Vaping status: Never Used  Substance and Sexual Activity   Alcohol use: No   Drug use: No   Sexual activity: Not on file  Other Topics Concern   Not on file  Social History Narrative   Not on file   Social Determinants of Health   Financial Resource Strain: Low Risk  (06/05/2022)   Received from [facility], [facility]   Overall Financial Resource Strain (CARDIA)    Difficulty of Paying Living Expenses: Not hard at all  Food Insecurity: No Food Insecurity (06/05/2022)   Received from [facility], [facility]   Hunger Vital Sign     Worried About Running Out of Food in the Last Year: Never true    Ran Out of Food in the Last Year: Never true  Transportation Needs: Not on file  Physical Activity: Sufficiently Active (06/05/2022)   Received from [facility], [facility]   Exercise Vital Sign    Days of Exercise per Week: 7 days    Minutes of Exercise per Session: 30 min  Stress: No Stress Concern Present (06/05/2022)   Received from [facility], [facility] of Occupational Health - Occupational Stress Questionnaire    Feeling of Stress : Only a little  Social Connections: Moderately Integrated (06/05/2022)   [facility] from [facility], [facility]   Social Network    How would you rate your social network (family, work, friends)?: Adequate participation with social networks  Intimate Partner Violence: Not At  Risk (06/05/2022)   Received from [facility], [facility]   HITS    Over the last 12 months how often did your partner physically hurt you?: 1    Over the last 12 months how often did your partner insult you or talk down to you?: 1    Over the last 12 months how often did your partner threaten you with physical harm?: 1    Over the last 12 months how often did your partner scream or curse at you?: 1    FAMILY HISTORY: Family History  Problem Relation Age of Onset   Hyperlipidemia Mother    Breast cancer Mother 72   Heart attack Father    Diabetes Brother    Breast cancer Maternal Aunt        diagnosed 59s or 62s   Breast cancer Paternal Aunt 34       diagnosed in her 63s   Lung cancer Paternal Uncle    Colon cancer Neg Hx    Colon polyps Neg Hx    Esophageal cancer Neg Hx    Rectal cancer Neg Hx    Stomach cancer Neg Hx     ALLERGIES:  is allergic to other and latex.  MEDICATIONS:  Current Outpatient Medications  Medication Sig Dispense Refill   aspirin EC 325 MG tablet Take 1 tablet (325 mg total) by mouth daily. MUST TAKE AT LEAST 4 WEEKS POSTOP FOR DVT  PROPHYLAXIS. 30 tablet 0   B Complex-C (SUPER B COMPLEX/VITAMIN C PO) Take 1 tablet by mouth every evening.     chlorpheniramine (CHLOR-TRIMETON) 4 MG tablet Take 4 mg by mouth at bedtime.     Cholecalciferol (VITAMIN D3) 50 MCG (2000 UT) TABS Take 2,000 Units by mouth in the morning.     docusate sodium (COLACE) 100 MG capsule Take 100 mg by mouth in the morning and at bedtime.     omeprazole (PRILOSEC) 20 MG capsule Take 20 mg by mouth daily.     pravastatin (PRAVACHOL) 40 MG tablet TAKE 1 TABLET BY MOUTH EVERY DAY IN THE EVENING 90 tablet 0   tamoxifen (NOLVADEX) 20 MG tablet TAKE 1 TABLET BY MOUTH EVERYDAY AT BEDTIME 90 tablet 4   No current facility-administered medications for this visit.    REVIEW OF SYSTEMS:   Review of Systems  All other systems reviewed and are negative.   PHYSICAL EXAMINATION: ECOG PERFORMANCE STATUS: 1 - Symptomatic but completely ambulatory  There were no vitals filed for this visit.  There were no vitals filed for this visit.  Physical Exam Constitutional:      Appearance: Normal appearance.  Cardiovascular:     Rate and Rhythm: Normal rate and regular rhythm.  Pulmonary:     Effort: Pulmonary effort is normal.     Breath sounds: Normal breath sounds.  Chest:  Breasts:    Right: Normal.     Left: Normal.     Comments: Left breast lumpectomy scar WNL.  Abdominal:     General: Bowel sounds are normal.     Palpations: Abdomen is soft.  Musculoskeletal:        General: No swelling. Normal range of motion.  Lymphadenopathy:     Cervical: No cervical adenopathy.     Upper Body:     Right upper body: No axillary adenopathy.     Left upper body: No axillary adenopathy.  Neurological:     Mental Status: She is alert and oriented to person, place, and time. Mental  status is at baseline.     LABORATORY DATA:  I have reviewed the data as listed Lab Results  Component Value Date   WBC 7.5 01/11/2023   HGB 12.1 01/11/2023   HCT 36.9  01/11/2023   MCV 90.0 01/11/2023   PLT 204 01/11/2023   Lab Results  Component Value Date   NA 137 01/11/2023   K 3.5 01/11/2023   CL 102 01/11/2023   CO2 26 01/11/2023    RADIOGRAPHIC STUDIES: I have personally reviewed the radiological reports and agreed with the findings in the report.  ASSESSMENT:  1.  Stage I (T1 cN0 G1) right breast cancer, ER positive, PR/HER2 negative: - Right breast 12:00 biopsy (11/16/2015): IDC with calcifications, ER 100%, PR 0%, Ki-67 5%, HER2 negative by FISH - Right lumpectomy and SLNB (12/22/2015): 1.1 cm invasive ductal carcinoma, tumor focally less than 0.1 cm from anterior margin, 0/1 lymph node involved, grade 1, Ki-67 5% - Oncotype DX: Recurrence score 21 with 10-year risk of distant recurrence 14% with tamoxifen alone - [facility] testing negative - Adjuvant XRT from 02/06/2016 through 03/05/2016 - Tamoxifen started in October 2017, to be continued total 10 years  2.  Social/family history: - Lives at home with her husband.  Non-smoker. - She worked at [employer] and had chlorine exposure.  She worked for [employer] for 28 years. - Mother had breast cancer in her 13s.  PLAN:  1.  Stage I right breast cancer, ER positive: - We reviewed her mammogram dated 01/08/23, BI-RADS Category 1. - Physical examination today did not reveal any suspicious masses or lymphadenopathy. - She will continue tamoxifen for total of 10 years.  We have given refills. - RTC 1 year for follow-up with repeat mammogram and labs.  2.  Bone health: - DEXA scan on 06/18/2019 with T-score -0.6 - Continue vitamin D 2000 units daily. - Recommend checking vitamin D today and at 1 year visit.  PLAN SUMMARY: >> RTC in 1 year for f/u, mammogram and labs. Mammogram ordered. >> Continue Tamoxifen until 2027.    I spent 20 minutes dedicated to the care of this patient (face-to-face and non-face-to-face) on the date of the encounter to include what is described in the  assessment and plan.   All questions were answered. The patient knows to call the clinic with any problems, questions or concerns.    Mauro Kaufmann, NP 01/17/23

## 2023-01-17 NOTE — Progress Notes (Signed)
Patient is taking tamoxifen as prescribed.  She has not missed any doses and reports no side effects at this time.  ? ?

## 2023-03-19 ENCOUNTER — Other Ambulatory Visit: Payer: Self-pay | Admitting: Hematology

## 2023-03-19 ENCOUNTER — Other Ambulatory Visit: Payer: Self-pay | Admitting: *Deleted

## 2023-03-19 NOTE — Telephone Encounter (Signed)
Tamoxifen refill approved.  Patient is tolerating and is to continue therapy. 

## 2023-08-13 ENCOUNTER — Ambulatory Visit
Admission: EM | Admit: 2023-08-13 | Discharge: 2023-08-13 | Disposition: A | Discharge status: Home / Self Care | Payer: Medicare Other | Attending: Nurse Practitioner | Admitting: Nurse Practitioner

## 2023-08-13 DIAGNOSIS — M25551 Pain in right hip: Secondary | ICD-10-CM

## 2023-08-13 DIAGNOSIS — J101 Influenza due to other identified influenza virus with other respiratory manifestations: Secondary | ICD-10-CM

## 2023-08-13 DIAGNOSIS — G8929 Other chronic pain: Secondary | ICD-10-CM

## 2023-08-13 LAB — POC COVID19/FLU A&B COMBO
Covid Antigen, POC: NEGATIVE
Influenza A Antigen, POC: POSITIVE — AB
Influenza B Antigen, POC: NEGATIVE

## 2023-08-13 MED ORDER — OSELTAMIVIR PHOSPHATE 75 MG PO CAPS: 75.0000 mg | ORAL_CAPSULE | Freq: Two times a day (BID) | ORAL | 0 refills | Status: DC

## 2023-08-13 NOTE — Discharge Instructions (Addendum)
 You have tested positive for influenza A. Recommend over-the-counter Tylenol for pain, fever or  Recommend the use of Pedialyte or Gatorade to prevent dehydration. Recommend using a humidifier in the bedroom at nighttime during sleep and having her sleep elevated on pillows while cough symptoms persist. If your fever returns, you should remain home until you have been fever free for 24 hours with no medication. Please be advised that symptoms should improve over the next 5 to 7 days.  If symptoms appear to be worsening, or if there are other concerns, you may follow-up in this clinic or with his pediatrician for further evaluation.  Hip Pain: Recommend over-the-counter Tylenol for pain or discomfort. May use ice or heat as needed to help with pain or discomfort. If symptoms continue to worsen, please follow-up with orthopedics for further evaluation. I provided information for [facility] care [place] for follow-up.  Please call to make an appointment. Follow-up as needed.

## 2023-08-13 NOTE — ED Provider Notes (Signed)
 [facility]    CSN: [id number] Arrival date & time: 08/13/23  4098      History   Chief Complaint No chief complaint on file.   HPI Alexis [name] is a 73 y.o. female.   The history is provided by the patient and the spouse.   The patient presents with her spouse for complaints of fever, fatigue, headache, body aches, and a mild cough for the past 2 days.  Per spouse, fever and cough have since improved.  Denies ear pain, ear drainage, wheezing, chest pain, abdominal pain, nausea, vomiting, diarrhea, or rash.  Patient has been taking Aleve for her symptoms.  Patient and spouse deny any obvious known sick contacts.  Patient also complains of right hip pain.  Patient with history of right hip surgery x 2.  She states over the past several days, pain has worsened.  She denies any new injury or trauma, radiation of pain, or inability to bear weight.  Patient has not seen orthopedics for quite some time, states hip surgery was approximately 7 to 8 years ago.  Past Medical History:  Diagnosis Date   Allergy    Arthritis    bilateral hips   Asthma    environmental now resolved   Breast cancer (HCC) 11/16/2015   Right Breast   Chronic fatigue    Cough    Family history of breast cancer    Family history of lung cancer    GERD (gastroesophageal reflux disease)    Heart murmur    "years ago, when I was young"   Hemangioma    History of hiatal hernia    Hyperlipidemia    IBS (irritable bowel syndrome)    Iron deficiency anemia    Kyphosis of thoracic region    MVP (mitral valve prolapse)    Osteopenia    Personal history of radiation therapy    SAH (subarachnoid hemorrhage) (HCC)    Vertigo     Patient Active Problem List   Diagnosis Date Noted   HLD (hyperlipidemia) 08/16/2021   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/09/2021   Genetic testing 02/09/2019   Family history of breast cancer    Family history of lung cancer    Upper airway cough syndrome 04/30/2017    Palpitations 04/30/2017   Chronic bilateral low back pain 02/28/2017   Malignant neoplasm of upper-outer quadrant of right breast in female, 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   Microcytic anemia 04/22/2015   Premature atrial contractions 06/24/2009   HEMANGIOMA 05/02/2007   SAH  h/o 2003 05/02/2007   GERD 05/02/2007   IRRITABLE BOWEL SYNDROME 05/02/2007   KYPHOSCOLIOSIS, 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    Past Surgical History:  Procedure Laterality Date   APPENDECTOMY     BREAST BIOPSY Right 11/16/2015   U/S Core- Malignant   BREAST BIOPSY Left 06/21/2005   Stereo- Benign   BREAST EXCISIONAL BIOPSY Left 2007   BREAST LUMPECTOMY Right 12/22/2015   BREAST LUMPECTOMY WITH RADIOACTIVE SEED AND SENTINEL LYMPH NODE BIOPSY Right 12/22/2015   Procedure: BREAST LUMPECTOMY WITH RADIOACTIVE SEED AND SENTINEL LYMPH NODE BIOPSY;  Surgeon: Ovidio Kin, MD;  Location: [facility];  Service: General;  Laterality: Right;   COLONOSCOPY     FOOT SURGERY Right    bunionectomy   MOUTH SURGERY     [facility]  2003   s/p embolization per Dr. Corliss Skains   TONSILLECTOMY     TOTAL HIP ARTHROPLASTY Left 01/11/2017   Procedure: LEFT TOTAL HIP ARTHROPLASTY ANTERIOR  APPROACH;  Surgeon: Eldred Manges, MD;  Location: [facility] OR;  Service: Orthopedics;  Laterality: Left;   TOTAL HIP ARTHROPLASTY Right 02/03/2021   Procedure: RIGHT TOTAL HIP ARTHROPLASTY ANTERIOR APPROACH;  Surgeon: Eldred Manges, MD;  Location: [facility] OR;  Service: Orthopedics;  Laterality: Right;   TUBAL LIGATION     UPPER GASTROINTESTINAL ENDOSCOPY      OB History   No obstetric history on file.      Home Medications    Prior to Admission medications   Medication Sig Start Date End Date Taking? Authorizing Provider  aspirin EC 325 MG tablet Take 1 tablet (325 mg total) by mouth daily. MUST TAKE AT LEAST 4 WEEKS POSTOP FOR DVT PROPHYLAXIS. 02/03/21   Naida Sleight, PA-C  B Complex-C (SUPER B COMPLEX/VITAMIN C PO) Take 1 tablet by  mouth every evening.    [provider]  chlorpheniramine (CHLOR-TRIMETON) 4 MG tablet Take 4 mg by mouth at bedtime.    [provider]  Cholecalciferol (VITAMIN D3) 50 MCG (2000 UT) TABS Take 2,000 Units by mouth in the morning.    [provider]  docusate sodium (COLACE) 100 MG capsule Take 100 mg by mouth in the morning and at bedtime.    [provider]  omeprazole (PRILOSEC) 20 MG capsule Take 20 mg by mouth daily.    [provider]  oseltamivir (TAMIFLU) 75 MG capsule Take 1 capsule (75 mg total) by mouth every 12 (twelve) hours. 08/13/23  Yes Leath-Warren, Sadie Haber, NP  pravastatin (PRAVACHOL) 40 MG tablet TAKE 1 TABLET BY MOUTH EVERY DAY IN THE EVENING 07/31/22   Nyoka Cowden, MD  tamoxifen (NOLVADEX) 20 MG tablet TAKE 1 TABLET BY MOUTH EVERYDAY AT BEDTIME 03/19/23   Doreatha Massed, MD    Family History Family History  Problem Relation Age of Onset   Hyperlipidemia Mother    Breast cancer Mother 22   Heart attack Father    Diabetes Brother    Breast cancer Maternal Aunt        diagnosed 52s or 5s   Breast cancer Paternal Aunt 58       diagnosed in her 61s   Lung cancer Paternal Uncle    Colon cancer Neg Hx    Colon polyps Neg Hx    Esophageal cancer Neg Hx    Rectal cancer Neg Hx    Stomach cancer Neg Hx     Social History Social History   Tobacco Use   Smoking status: Never   Smokeless tobacco: Never  Vaping Use   Vaping status: Never Used  Substance Use Topics   Alcohol use: No   Drug use: No     Allergies   Other and Latex   Review of Systems Review of Systems Per HPI  Physical Exam Triage Vital Signs ED Triage Vitals  Encounter Vitals Group     BP 08/13/23 1034 139/75     Systolic BP Percentile --      Diastolic BP Percentile --      Pulse Rate 08/13/23 1034 92     Resp 08/13/23 1034 18     Temp 08/13/23 1034 (!) 97.3 F (36.3 C)     Temp Source 08/13/23 1034 Oral     SpO2 08/13/23 1034  95 %     Weight --      Height --      Head Circumference --      Peak Flow --  Pain Score 08/13/23 1037 5     Pain Loc --      Pain Education --      Exclude from Growth Chart --    No data found.  Updated Vital Signs BP 139/75 (BP Location: Left Arm)   Pulse 92   Temp (!) 97.3 F (36.3 C) (Oral)   Resp 18   SpO2 95%   Visual Acuity Right Eye Distance:   Left Eye Distance:   Bilateral Distance:    Right Eye Near:   Left Eye Near:    Bilateral Near:     Physical Exam Vitals and nursing note reviewed.  Constitutional:      General: She is not in acute distress.    Appearance: Normal appearance.  HENT:     Head: Normocephalic.     Right Ear: Tympanic membrane, ear canal and external ear normal.     Left Ear: Tympanic membrane, ear canal and external ear normal.     Nose: Nose normal.     Mouth/Throat:     Lips: Pink.     Mouth: Mucous membranes are moist.     Pharynx: Oropharynx is clear. Uvula midline.  Eyes:     Extraocular Movements: Extraocular movements intact.     Pupils: Pupils are equal, round, and reactive to light.  Cardiovascular:     Rate and Rhythm: Normal rate and regular rhythm.     Pulses: Normal pulses.     Heart sounds: Normal heart sounds.  Pulmonary:     Effort: Pulmonary effort is normal. No respiratory distress.     Breath sounds: Normal breath sounds. No stridor. No wheezing, rhonchi or rales.  Abdominal:     General: Bowel sounds are normal.     Palpations: Abdomen is soft.     Tenderness: There is no abdominal tenderness.  Musculoskeletal:     Cervical back: Normal range of motion.     Right hip: No deformity.  Skin:    General: Skin is warm and dry.  Neurological:     General: No focal deficit present.     Mental Status: She is alert and oriented to person, place, and time.  Psychiatric:        Mood and Affect: Mood normal.        Behavior: Behavior normal.      UC Treatments / Results  Labs (all labs ordered are  listed, but only abnormal results are displayed) Labs Reviewed  POC COVID19/FLU A&B COMBO - Abnormal; Notable for the following components:      Result Value   Influenza A Antigen, POC Positive (*)    All other components within normal limits    EKG   Radiology No results found.  Procedures Procedures (including critical care time)  Medications Ordered in UC Medications - No data to display  Initial Impression / Assessment and Plan / UC Course  I have reviewed the triage vital signs and the nursing notes.  Pertinent labs & imaging results that were available during my care of the patient were reviewed by me and considered in my medical decision making (see chart for details).  COVID/flu test was positive for influenza A.  Will start Tamiflu 75 mg twice daily for the next 5 days.  For hip pain, patient declined imaging.  Advised patient to continue over-the-counter analgesics, recommended following up with orthopedics if symptoms continue to persist.  Supportive care recommendations were provided and discussed with patient and spouse to include fluids, rest,  and staying home if she has fever.  With regard to hip pain, recommend the use of ice or heat, and continuing over-the-counter analgesics.  Information was provided for [facility] [place] if necessary for follow-up.  Patient and spouse were in agreement with this plan of care and verbalized understanding.  All questions were answered.  Patient stable for discharge.  Final Clinical Impressions(s) / UC Diagnoses   Final diagnoses:  Influenza A  Chronic pain of right hip     Discharge Instructions      You have tested positive for influenza A. Recommend over-the-counter Tylenol for pain, fever or  Recommend the use of Pedialyte or Gatorade to prevent dehydration. Recommend using a humidifier in the bedroom at nighttime during sleep and having her sleep elevated on pillows while cough symptoms persist. If your fever  returns, you should remain home until you have been fever free for 24 hours with no medication. Please be advised that symptoms should improve over the next 5 to 7 days.  If symptoms appear to be worsening, or if there are other concerns, you may follow-up in this clinic or with his pediatrician for further evaluation.  Hip Pain: Recommend over-the-counter Tylenol for pain or discomfort. May use ice or heat as needed to help with pain or discomfort. If symptoms continue to worsen, please follow-up with orthopedics for further evaluation. I provided information for Orth[facility] care [place] for follow-up.  Please call to make an appointment. Follow-up as needed.             ED Prescriptions     Medication Sig Dispense Auth. Provider   oseltamivir (TAMIFLU) 75 MG capsule Take 1 capsule (75 mg total) by mouth every 12 (twelve) hours. 10 capsule Leath-Warren, Sadie Haber, NP      PDMP not reviewed this encounter.   Abran Cantor, NP 08/13/23 1220

## 2023-08-13 NOTE — ED Triage Notes (Signed)
 Pt reports she has had fever, headache, body aches, and chills x 2 days

## 2024-01-08 ENCOUNTER — Other Ambulatory Visit: Payer: Self-pay

## 2024-01-08 DIAGNOSIS — E559 Vitamin D deficiency, unspecified: Secondary | ICD-10-CM

## 2024-01-08 DIAGNOSIS — Z170 Estrogen receptor positive status [ER+]: Secondary | ICD-10-CM

## 2024-01-09 ENCOUNTER — Inpatient Hospital Stay: Payer: Medicare Other | Attending: Hematology

## 2024-01-09 ENCOUNTER — Encounter (HOSPITAL_COMMUNITY): Payer: Self-pay

## 2024-01-09 ENCOUNTER — Ambulatory Visit (HOSPITAL_COMMUNITY)
Admission: RE | Admit: 2024-01-09 | Discharge: 2024-01-09 | Disposition: A | Discharge status: Home / Self Care | Payer: Medicare Other | Source: Ambulatory Visit | Attending: Oncology | Admitting: Oncology

## 2024-01-09 DIAGNOSIS — E559 Vitamin D deficiency, unspecified: Secondary | ICD-10-CM

## 2024-01-09 DIAGNOSIS — Z923 Personal history of irradiation: Secondary | ICD-10-CM | POA: Insufficient documentation

## 2024-01-09 DIAGNOSIS — C50411 Malignant neoplasm of upper-outer quadrant of right female breast: Secondary | ICD-10-CM | POA: Insufficient documentation

## 2024-01-09 DIAGNOSIS — Z1722 Progesterone receptor negative status: Secondary | ICD-10-CM | POA: Diagnosis not present

## 2024-01-09 DIAGNOSIS — M8580 Other specified disorders of bone density and structure, unspecified site: Secondary | ICD-10-CM | POA: Diagnosis not present

## 2024-01-09 DIAGNOSIS — Z1231 Encounter for screening mammogram for malignant neoplasm of breast: Secondary | ICD-10-CM | POA: Insufficient documentation

## 2024-01-09 DIAGNOSIS — Z170 Estrogen receptor positive status [ER+]: Secondary | ICD-10-CM | POA: Diagnosis not present

## 2024-01-09 DIAGNOSIS — C50811 Malignant neoplasm of overlapping sites of right female breast: Secondary | ICD-10-CM | POA: Insufficient documentation

## 2024-01-09 DIAGNOSIS — Z79810 Long term (current) use of selective estrogen receptor modulators (SERMs): Secondary | ICD-10-CM | POA: Insufficient documentation

## 2024-01-09 DIAGNOSIS — Z1732 Human epidermal growth factor receptor 2 negative status: Secondary | ICD-10-CM | POA: Diagnosis not present

## 2024-01-09 DIAGNOSIS — Z853 Personal history of malignant neoplasm of breast: Secondary | ICD-10-CM | POA: Insufficient documentation

## 2024-01-09 DIAGNOSIS — Z79899 Other long term (current) drug therapy: Secondary | ICD-10-CM | POA: Insufficient documentation

## 2024-01-09 LAB — COMPREHENSIVE METABOLIC PANEL WITH GFR
ALT: 22 U/L (ref 0–44)
AST: 29 U/L (ref 15–41)
Albumin: 3.7 g/dL (ref 3.5–5.0)
Alkaline Phosphatase: 37 U/L — ABNORMAL LOW (ref 38–126)
Anion gap: 6 (ref 5–15)
BUN: 14 mg/dL (ref 8–23)
CO2: 27 mmol/L (ref 22–32)
Calcium: 8.7 mg/dL — ABNORMAL LOW (ref 8.9–10.3)
Chloride: 106 mmol/L (ref 98–111)
Creatinine, Ser: 0.66 mg/dL (ref 0.44–1.00)
GFR, Estimated: >60 mL/min (ref >60)
Glucose, Bld: 91 mg/dL (ref 70–99)
Potassium: 3.5 mmol/L (ref 3.5–5.1)
Sodium: 139 mmol/L (ref 135–145)
Total Bilirubin: 0.8 mg/dL (ref 0.0–1.2)
Total Protein: 6.9 g/dL (ref 6.5–8.1)

## 2024-01-09 LAB — CBC WITH DIFFERENTIAL/PLATELET
Abs Immature Granulocytes: 0.01 K/uL (ref 0.00–0.07)
Basophils Absolute: 0 K/uL (ref 0.0–0.1)
Basophils Relative: 1 %
Eosinophils Absolute: 0.1 K/uL (ref 0.0–0.5)
Eosinophils Relative: 2 %
HCT: 36.4 % (ref 36.0–46.0)
Hemoglobin: 11.6 g/dL — ABNORMAL LOW (ref 12.0–15.0)
Immature Granulocytes: 0 %
Lymphocytes Relative: 21 %
Lymphs Abs: 1.2 K/uL (ref 0.7–4.0)
MCH: 29.6 pg (ref 26.0–34.0)
MCHC: 31.9 g/dL (ref 30.0–36.0)
MCV: 92.9 fL (ref 80.0–100.0)
Monocytes Absolute: 0.6 K/uL (ref 0.1–1.0)
Monocytes Relative: 11 %
Neutro Abs: 3.8 K/uL (ref 1.7–7.7)
Neutrophils Relative %: 65 %
Platelets: 168 K/uL (ref 150–400)
RBC: 3.92 MIL/uL (ref 3.87–5.11)
RDW: 13 % (ref 11.5–15.5)
WBC: 5.7 K/uL (ref 4.0–10.5)
nRBC: 0 % (ref 0.0–0.2)

## 2024-01-09 LAB — VITAMIN D 25 HYDROXY (VIT D DEFICIENCY, FRACTURES): Vit D, 25-Hydroxy: 35.84 ng/mL (ref 30–100)

## 2024-01-16 ENCOUNTER — Inpatient Hospital Stay (HOSPITAL_BASED_OUTPATIENT_CLINIC_OR_DEPARTMENT_OTHER): Payer: Medicare Other | Admitting: Oncology

## 2024-01-16 VITALS — BP 135/71 | HR 68 | Temp 98.0°F | Resp 18 | Wt 110.5 lb

## 2024-01-16 DIAGNOSIS — D509 Iron deficiency anemia, unspecified: Secondary | ICD-10-CM

## 2024-01-16 DIAGNOSIS — D649 Anemia, unspecified: Secondary | ICD-10-CM | POA: Diagnosis not present

## 2024-01-16 DIAGNOSIS — E559 Vitamin D deficiency, unspecified: Secondary | ICD-10-CM | POA: Diagnosis not present

## 2024-01-16 DIAGNOSIS — Z170 Estrogen receptor positive status [ER+]: Secondary | ICD-10-CM

## 2024-01-16 DIAGNOSIS — C50411 Malignant neoplasm of upper-outer quadrant of right female breast: Secondary | ICD-10-CM

## 2024-01-16 NOTE — Progress Notes (Signed)
 [facility] CONSULT NOTE  Patient Care Team: Suanne Pfeiffer, NP as PCP - General (Adult Health Nurse Practitioner) Francyne Headland, MD as PCP - Cardiology (Cardiology) Ethyl Lenis, MD as Consulting Physician (General Surgery) Dewey Rush, MD as Consulting Physician (Radiation Oncology) Shona Rush, MD (Dermatology) Regal, Pas[place] RAMAN, DPM as Consulting Physician (Podiatry) Rogers Hai, MD as Medical Oncologist (Medical Oncology)  CHIEF COMPLAINTS/PURPOSE OF CONSULTATION:  Stage I right breast cancer  HISTORY OF PRESENTING ILLNESS:  Alexis [name] 73 y.o. female is seen for follow-up for stage I right breast cancer.    She was initially diagnosed with right breast cancer in May 2017, underwent right lumpectomy in July 2017.  She underwent radiation and completed in September 2017 followed by tamoxifen  which was started in October 2017.    She reports that she is tolerating tamoxifen  very well.  No vaginal discharge or spotting noted.  No major hot flashes.  No new onset pains.  She is taking vitamin D  2000 units daily.  No prior history of DVT/MI/CVA. No recent hospitalizations.  She will stay on tamoxifen  for 10 years completing in September 2027.   SUMMARY OF ONCOLOGIC HISTORY: Oncology History  Malignant neoplasm of upper-outer quadrant of right breast in female, estrogen receptor positive (HCC)  11/16/2015 Breast US    Targeted ultrasound is performed, showing an irregular, hypoechoic mass at 12 o'clock, 1 cm from the nipple measuring 1.2 x 0.8 x 0.9 cm, corresponding to the area of concern within the superior right breast.    11/16/2015 Initial Biopsy   (R) breast needle biopsy (12:00): IDC with calcs, DCIS with calcs, grade 1. ER+ (100%), PR-, Ki67 5%, HER2 neg (ratio 1.00).    11/17/2015 Initial Diagnosis   Breast cancer of upper-outer quadrant of right female breast (HCC)   12/22/2015 Surgery   (R) lumpectomy with SLNB Jeoffrey): IDC, spanning 1.1 cm,  DCIS with calcs, tumor focally <0.1 cm from anterior margin. 0/1 SLN.   pT1c, pN0: Stage IA    12/22/2015 Oncotype testing   Recurrence score: 21; ROR 14% (intermediate risk)    02/06/2016 - 03/05/2016 Radiation Therapy   Adjuvant breast radiation Valene). Right breast: 42.5 Gy in 17 fx. Right breast boost: 7.5 Gy in 3 fx   03/2016 -  Anti-estrogen oral therapy   Tamoxifen  20 mg daily. Planned duration of therapy: 5 years   02/09/2019 Genetic Testing   Negative genetic testing on the [facility] Common Hereditary Cancers panel.  The Common Hereditary Cancers Panel offered by [facility] includes sequencing and/or deletion duplication testing of the following 48 genes: APC, ATM, AXIN2, BARD1, BMPR1A, BRCA1, BRCA2, BRIP1, CDH1, CDK4, CDKN2A (p14ARF), CDKN2A (p16INK4a), CHEK2, CTNNA1, DICER1, EPCAM (Deletion/duplication testing only), GREM1 (promoter region deletion/duplication testing only), KIT, MEN1, MLH1, MSH2, MSH3, MSH6, MUTYH, NBN, NF1, NHTL1, PALB2, PDGFRA, PMS2, POLD1, POLE, PTEN, RAD50, RAD51C, RAD51D, RNF43, SDHB, SDHC, SDHD, SMAD4, SMARCA4. STK11, TP53, TSC1, TSC2, and VHL.  The following genes were evaluated for sequence changes only: SDHA and HOXB13 c.251G>A variant only.       MEDICAL HISTORY:  Past Medical History:  Diagnosis Date   Allergy     Arthritis    bilateral hips   Asthma    environmental now resolved   Breast cancer (HCC) 11/16/2015   Right Breast   Chronic fatigue    Cough    Family history of breast cancer    Family history of lung cancer    GERD (gastroesophageal reflux disease)    Heart murmur  years ago, when I was young   Hemangioma    History of hiatal hernia    Hyperlipidemia    IBS (irritable bowel syndrome)    Iron  deficiency anemia    Kyphosis of thoracic region    MVP (mitral valve prolapse)    Osteopenia    Personal history of radiation therapy    SAH (subarachnoid hemorrhage) (HCC)    Vertigo     SURGICAL HISTORY: Past Surgical History:   Procedure Laterality Date   APPENDECTOMY     BREAST BIOPSY Right 11/16/2015   U/S Core- Malignant   BREAST BIOPSY Left 06/21/2005   Stereo- Benign   BREAST EXCISIONAL BIOPSY Left 2007   BREAST LUMPECTOMY Right 12/22/2015   BREAST LUMPECTOMY WITH RADIOACTIVE SEED AND SENTINEL LYMPH NODE BIOPSY Right 12/22/2015   Procedure: BREAST LUMPECTOMY WITH RADIOACTIVE SEED AND SENTINEL LYMPH NODE BIOPSY;  Surgeon: Alm Angle, MD;  Location: [facility];  Service: General;  Laterality: Right;   COLONOSCOPY     FOOT SURGERY Right    bunionectomy   MOUTH SURGERY     [facility]  2003   s/p embolization per Dr. Dolphus   TONSILLECTOMY     TOTAL HIP ARTHROPLASTY Left 01/11/2017   Procedure: LEFT TOTAL HIP ARTHROPLASTY ANTERIOR APPROACH;  Surgeon: Barbarann Oneil BROCKS, MD;  Location: [facility] OR;  Service: Orthopedics;  Laterality: Left;   TOTAL HIP ARTHROPLASTY Right 02/03/2021   Procedure: RIGHT TOTAL HIP ARTHROPLASTY ANTERIOR APPROACH;  Surgeon: Barbarann Oneil BROCKS, MD;  Location: [facility] OR;  Service: Orthopedics;  Laterality: Right;   TUBAL LIGATION     UPPER GASTROINTESTINAL ENDOSCOPY      SOCIAL HISTORY: Social History   Socioeconomic History   Marital status: Married    Spouse name: Not on file   Number of children: Not on file   Years of education: Not on file   Highest education level: Not on file  Occupational History   Occupation: CSR  Tobacco Use   Smoking status: Never   Smokeless tobacco: Never  Vaping Use   Vaping status: Never Used  Substance and Sexual Activity   Alcohol use: No   Drug use: No   Sexual activity: Not on file  Other Topics Concern   Not on file  Social History Narrative   Not on file   Social Drivers of Health   Financial Resource Strain: Low Risk  (08/25/2023)   Received from [facility]   Overall Financial Resource Strain (CARDIA)    Difficulty of Paying Living Expenses: Not hard at all  Food Insecurity: No Food Insecurity (08/25/2023)   Received from [facility]   Hunger Vital Sign    Within the past 12 months, you worried that your food would run out before you got the money to buy more.: Never true    Within the past 12 months, the food you bought just didn't last and you didn't have money to get more.: Never true  Transportation Needs: No Transportation Needs (08/25/2023)   Received from [facility] - Transportation    Lack of Transportation (Medical): No    Lack of Transportation (Non-Medical): No  Physical Activity: Unknown (08/25/2023)   Received from [facility]   Exercise Vital Sign    On average, how many days per week do you engage in moderate to strenuous exercise (like a brisk walk)?: 0 days    Minutes of Exercise per Session: Not on file  Stress: No Stress Concern Present (08/25/2023)  Received from [facility] - Occupational Stress Questionnaire    Feeling of Stress : Only a little  Social Connections: Socially Integrated (08/25/2023)   Received from [facility]   Social Network    How would you rate your social network (family, work, friends)?: Good participation with social networks  Intimate Partner Violence: Not At Risk (08/25/2023)   Received from [facility]   HITS    Over the last 12 months how often did your partner physically hurt you?: Never    Over the last 12 months how often did your partner insult you or talk down to you?: Rarely    Over the last 12 months how often did your partner threaten you with physical harm?: Never    Over the last 12 months how often did your partner scream or curse at you?: Never    FAMILY HISTORY: Family History  Problem Relation Age of Onset   Hyperlipidemia Mother    Breast cancer Mother 60   Heart attack Father    Diabetes Brother    Breast cancer Maternal Aunt        diagnosed 46s or 64s   Breast cancer Paternal Aunt 69       diagnosed in her 50s   Lung cancer Paternal Uncle    Colon cancer Neg Hx    Colon polyps  Neg Hx    Esophageal cancer Neg Hx    Rectal cancer Neg Hx    Stomach cancer Neg Hx     ALLERGIES:  is allergic to other and latex.  MEDICATIONS:  Current Outpatient Medications  Medication Sig Dispense Refill   aspirin  EC 325 MG tablet Take 1 tablet (325 mg total) by mouth daily. MUST TAKE AT LEAST 4 WEEKS POSTOP FOR DVT PROPHYLAXIS. 30 tablet 0   B Complex-C (SUPER B COMPLEX/VITAMIN C PO) Take 1 tablet by mouth every evening.     chlorpheniramine (CHLOR-TRIMETON) 4 MG tablet Take 4 mg by mouth at bedtime.     Cholecalciferol  (VITAMIN D3) 50 MCG (2000 UT) TABS Take 2,000 Units by mouth in the morning.     docusate sodium  (COLACE) 100 MG capsule Take 100 mg by mouth in the morning and at bedtime.     omeprazole  (PRILOSEC) 20 MG capsule Take 20 mg by mouth daily.     oseltamivir  (TAMIFLU ) 75 MG capsule Take 1 capsule (75 mg total) by mouth every 12 (twelve) hours. 10 capsule 0   pravastatin  (PRAVACHOL ) 40 MG tablet TAKE 1 TABLET BY MOUTH EVERY DAY IN THE EVENING 90 tablet 0   tamoxifen  (NOLVADEX ) 20 MG tablet TAKE 1 TABLET BY MOUTH EVERYDAY AT BEDTIME 90 tablet 4   No current facility-administered medications for this visit.    REVIEW OF SYSTEMS:   Review of Systems  All other systems reviewed and are negative.   PHYSICAL EXAMINATION: ECOG PERFORMANCE STATUS: 1 - Symptomatic but completely ambulatory  There were no vitals filed for this visit.  There were no vitals filed for this visit.  Physical Exam Constitutional:      Appearance: Normal appearance.  Cardiovascular:     Rate and Rhythm: Normal rate and regular rhythm.  Pulmonary:     Effort: Pulmonary effort is normal.     Breath sounds: Normal breath sounds.  Chest:  Breasts:    Right: Normal.     Left: Normal.     Comments: Left breast lumpectomy scar WNL.  Abdominal:  General: Bowel sounds are normal.     Palpations: Abdomen is soft.  Musculoskeletal:        General: No swelling. Normal range of motion.   Lymphadenopathy:     Cervical: No cervical adenopathy.     Upper Body:     Right upper body: No axillary adenopathy.     Left upper body: No axillary adenopathy.  Neurological:     Mental Status: She is alert and oriented to person, place, and time. Mental status is at baseline.     LABORATORY DATA:  I have reviewed the data as listed Lab Results  Component Value Date   WBC 5.7 01/09/2024   HGB 11.6 (L) 01/09/2024   HCT 36.4 01/09/2024   MCV 92.9 01/09/2024   PLT 168 01/09/2024   Lab Results  Component Value Date   NA 139 01/09/2024   K 3.5 01/09/2024   CL 106 01/09/2024   CO2 27 01/09/2024    RADIOGRAPHIC STUDIES: I have personally reviewed the radiological reports and agreed with the findings in the report.  ASSESSMENT:  1.  Stage I (T1 cN0 G1) right breast cancer, ER positive, PR/HER2 negative: - Right breast 12:00 biopsy (11/16/2015): IDC with calcifications, ER 100%, PR 0%, Ki-67 5%, HER2 negative by FISH - Right lumpectomy and SLNB (12/22/2015): 1.1 cm invasive ductal carcinoma, tumor focally less than 0.1 cm from anterior margin, 0/1 lymph node involved, grade 1, Ki-67 5% - Oncotype DX: Recurrence score 21 with 10-year risk of distant recurrence 14% with tamoxifen  alone - [facility] testing negative - Adjuvant XRT from 02/06/2016 through 03/05/2016 - Tamoxifen  started in October 2017, to be continued total 10 years  2.  Social/family history: - Lives at home with her husband.  Non-smoker. - She worked at [employer] and had chlorine exposure.  She worked for [employer] for 28 years. - Mother had breast cancer in her 68s.  PLAN:  1.  Stage I right breast cancer, ER positive: - Reviewed mammogram from 01/09/2024 which was read as BI-RADS Category 1-negative. - Physical examination today did not reveal any suspicious masses or lymphadenopathy. - She will continue tamoxifen  for total of 10 years.  We have given refills. - RTC 1 year for follow-up with repeat  mammogram and labs.  2.  Bone health: - DEXA scan on 06/18/2019 with T-score -0.6 - Continue vitamin D  2000 units daily. - Recommend checking vitamin D  today and at 1 year visit.  3.  Anemia: -Discovered incidentally during lab work from 01/09/2024. -Denies any abnormal bleeding. -CBC from 01/09/2024 shows hemoglobin 11.6 but otherwise unremarkable CBC. -Most recent colonoscopy from 03/05/2019 which showed a normal colon.  Repeat in 10 years. -Discussed adding on nutritional labs in 6 months.  She will go ahead and start B12 and iron  supplements a few times per week.  I will call with results from her lab work. -Reports she has been anemic in the past and required iron  supplements.  PLAN SUMMARY: >> Nutritional labs in 6 months and telephone visit >>RTC in 1 year for f/u, mammogram and labs. Mammogram ordered. >> Continue Tamoxifen  until 2027.    I spent 20 minutes dedicated to the care of this patient (face-to-face and non-face-to-face) on the date of the encounter to include what is described in the assessment and plan.   All questions were answered. The patient knows to call the clinic with any problems, questions or concerns.    Alexis FORBES Hope, NP 01/16/24

## 2024-04-17 ENCOUNTER — Inpatient Hospital Stay: Attending: Hematology

## 2024-04-17 DIAGNOSIS — Z170 Estrogen receptor positive status [ER+]: Secondary | ICD-10-CM | POA: Insufficient documentation

## 2024-04-17 DIAGNOSIS — Z1722 Progesterone receptor negative status: Secondary | ICD-10-CM | POA: Diagnosis not present

## 2024-04-17 DIAGNOSIS — Z1732 Human epidermal growth factor receptor 2 negative status: Secondary | ICD-10-CM | POA: Diagnosis not present

## 2024-04-17 DIAGNOSIS — D649 Anemia, unspecified: Secondary | ICD-10-CM

## 2024-04-17 DIAGNOSIS — D509 Iron deficiency anemia, unspecified: Secondary | ICD-10-CM | POA: Insufficient documentation

## 2024-04-17 DIAGNOSIS — Z79810 Long term (current) use of selective estrogen receptor modulators (SERMs): Secondary | ICD-10-CM | POA: Diagnosis not present

## 2024-04-17 DIAGNOSIS — E559 Vitamin D deficiency, unspecified: Secondary | ICD-10-CM

## 2024-04-17 DIAGNOSIS — C50811 Malignant neoplasm of overlapping sites of right female breast: Secondary | ICD-10-CM | POA: Diagnosis present

## 2024-04-17 LAB — COMPREHENSIVE METABOLIC PANEL WITH GFR
ALT: 18 U/L (ref 0–44)
AST: 34 U/L (ref 15–41)
Albumin: 4.2 g/dL (ref 3.5–5.0)
Alkaline Phosphatase: 38 U/L (ref 38–126)
Anion gap: 9 (ref 5–15)
BUN: 10 mg/dL (ref 8–23)
CO2: 29 mmol/L (ref 22–32)
Calcium: 8.9 mg/dL (ref 8.9–10.3)
Chloride: 103 mmol/L (ref 98–111)
Creatinine, Ser: 0.78 mg/dL (ref 0.44–1.00)
GFR, Estimated: >60 mL/min (ref >60)
Glucose, Bld: 124 mg/dL — ABNORMAL HIGH (ref 70–99)
Potassium: 3.8 mmol/L (ref 3.5–5.1)
Sodium: 141 mmol/L (ref 135–145)
Total Bilirubin: 0.7 mg/dL (ref 0.0–1.2)
Total Protein: 6.7 g/dL (ref 6.5–8.1)

## 2024-04-17 LAB — IRON AND TIBC
Iron: 85 ug/dL (ref 28–170)
Saturation Ratios: 22 % (ref 10.4–31.8)
TIBC: 391 ug/dL (ref 250–450)
UIBC: 306 ug/dL

## 2024-04-17 LAB — CBC WITH DIFFERENTIAL/PLATELET
Abs Immature Granulocytes: 0.01 K/uL (ref 0.00–0.07)
Basophils Absolute: 0 K/uL (ref 0.0–0.1)
Basophils Relative: 1 %
Eosinophils Absolute: 0.1 K/uL (ref 0.0–0.5)
Eosinophils Relative: 1 %
HCT: 37.1 % (ref 36.0–46.0)
Hemoglobin: 12 g/dL (ref 12.0–15.0)
Immature Granulocytes: 0 %
Lymphocytes Relative: 17 %
Lymphs Abs: 1 K/uL (ref 0.7–4.0)
MCH: 29.1 pg (ref 26.0–34.0)
MCHC: 32.3 g/dL (ref 30.0–36.0)
MCV: 90 fL (ref 80.0–100.0)
Monocytes Absolute: 0.6 K/uL (ref 0.1–1.0)
Monocytes Relative: 11 %
Neutro Abs: 4.1 K/uL (ref 1.7–7.7)
Neutrophils Relative %: 70 %
Platelets: 186 K/uL (ref 150–400)
RBC: 4.12 MIL/uL (ref 3.87–5.11)
RDW: 13.4 % (ref 11.5–15.5)
WBC: 5.8 K/uL (ref 4.0–10.5)
nRBC: 0 % (ref 0.0–0.2)

## 2024-04-17 LAB — FOLATE: Folate: >20 ng/mL (ref >5.9)

## 2024-04-17 LAB — FERRITIN: Ferritin: 21 ng/mL (ref 11–307)

## 2024-04-17 LAB — VITAMIN B12: Vitamin B-12: 705 pg/mL (ref 180–914)

## 2024-04-22 LAB — COPPER, SERUM: Copper: 142 ug/dL (ref 80–158)

## 2024-04-24 ENCOUNTER — Inpatient Hospital Stay: Attending: Hematology | Admitting: Oncology

## 2024-04-24 DIAGNOSIS — R790 Abnormal level of blood mineral: Secondary | ICD-10-CM

## 2024-04-24 DIAGNOSIS — D649 Anemia, unspecified: Secondary | ICD-10-CM | POA: Diagnosis not present

## 2024-04-24 LAB — METHYLMALONIC ACID, SERUM: Methylmalonic Acid, Quantitative: 173 nmol/L (ref 0–378)

## 2024-04-24 MED ORDER — FERROUS GLUCONATE 324 (38 FE) MG PO TABS: 324.0000 mg | ORAL_TABLET | ORAL | 3 refills | Status: AC

## 2024-04-24 MED ORDER — VITAMIN C 250 MG PO TABS: 250.0000 mg | ORAL_TABLET | ORAL | 2 refills | Status: DC

## 2024-04-24 NOTE — Progress Notes (Signed)
 [facility] CONSULT NOTE  Patient Care Team: Suanne Pfeiffer, NP as PCP - General (Adult Health Nurse Practitioner) Francyne Headland, MD as PCP - Cardiology (Cardiology) Ethyl Lenis, MD as Consulting Physician (General Surgery) Dewey Rush, MD as Consulting Physician (Radiation Oncology) Shona Rush, MD (Dermatology) Regal, Pas[place] RAMAN, DPM as Consulting Physician (Podiatry)  CHIEF COMPLAINTS/PURPOSE OF CONSULTATION:  Stage I right breast cancer  I connected with Rock FORBES Ada on 04/24/24 at  1:30 PM EST by telephone visit and verified that I am speaking with the correct person using two identifiers.   I discussed the limitations, risks, security and privacy concerns of performing an evaluation and management service by telemedicine and the availability of in-person appointments. I also discussed with the patient that there may be a patient responsible charge related to this service. The patient expressed understanding and agreed to proceed.   Other persons participating in the visit and their role in the encounter: NP, Patient    Patient's location: Home  Provider's location: Clinic    HISTORY OF PRESENTING ILLNESS:  Alexis [name] 73 y.o. female is seen for follow-up for stage I right breast cancer.    She was initially diagnosed with right breast cancer in May 2017, underwent right lumpectomy in July 2017.  She underwent radiation and completed in September 2017 followed by tamoxifen  which was started in October 2017.    She reports that she is tolerating tamoxifen  very well.  No vaginal discharge or spotting noted.  No major hot flashes.  No new onset pains.  She is taking vitamin D  2000 units daily.  No prior history of DVT/MI/CVA. No recent hospitalizations.  She will stay on tamoxifen  for 10 years completing in September 2027.  She is here today to review most recent nutritional labs.  She is found to be slightly anemic at her last visit.  Reports she has been  iron  deficient before but she is currently not taking iron  tablets.  Reports an appetite and energy level 100%.  Has knee pain intermittently.  Denies any bright red blood per rectum, melena or hematochezia.  Denies ice pica.   SUMMARY OF ONCOLOGIC HISTORY: Oncology History  Malignant neoplasm of upper-outer quadrant of right breast in female, estrogen receptor positive (HCC)  11/16/2015 Breast US    Targeted ultrasound is performed, showing an irregular, hypoechoic mass at 12 o'clock, 1 cm from the nipple measuring 1.2 x 0.8 x 0.9 cm, corresponding to the area of concern within the superior right breast.    11/16/2015 Initial Biopsy   (R) breast needle biopsy (12:00): IDC with calcs, DCIS with calcs, grade 1. ER+ (100%), PR-, Ki67 5%, HER2 neg (ratio 1.00).    11/17/2015 Initial Diagnosis   Breast cancer of upper-outer quadrant of right female breast (HCC)   12/22/2015 Surgery   (R) lumpectomy with SLNB Jeoffrey): IDC, spanning 1.1 cm, DCIS with calcs, tumor focally <0.1 cm from anterior margin. 0/1 SLN.   pT1c, pN0: Stage IA    12/22/2015 Oncotype testing   Recurrence score: 21; ROR 14% (intermediate risk)    02/06/2016 - 03/05/2016 Radiation Therapy   Adjuvant breast radiation Valene). Right breast: 42.5 Gy in 17 fx. Right breast boost: 7.5 Gy in 3 fx   03/2016 -  Anti-estrogen oral therapy   Tamoxifen  20 mg daily. Planned duration of therapy: 5 years   02/09/2019 Genetic Testing   Negative genetic testing on the [facility] Common Hereditary Cancers panel.  The Common Hereditary Cancers Panel offered by  [facility] includes sequencing and/or deletion duplication testing of the following 48 genes: APC, ATM, AXIN2, BARD1, BMPR1A, BRCA1, BRCA2, BRIP1, CDH1, CDK4, CDKN2A (p14ARF), CDKN2A (p16INK4a), CHEK2, CTNNA1, DICER1, EPCAM (Deletion/duplication testing only), GREM1 (promoter region deletion/duplication testing only), KIT, MEN1, MLH1, MSH2, MSH3, MSH6, MUTYH, NBN, NF1, NHTL1, PALB2, PDGFRA, PMS2, POLD1,  POLE, PTEN, RAD50, RAD51C, RAD51D, RNF43, SDHB, SDHC, SDHD, SMAD4, SMARCA4. STK11, TP53, TSC1, TSC2, and VHL.  The following genes were evaluated for sequence changes only: SDHA and HOXB13 c.251G>A variant only.       MEDICAL HISTORY:  Past Medical History:  Diagnosis Date   Allergy     Arthritis    bilateral hips   Asthma    environmental now resolved   Breast cancer (HCC) 11/16/2015   Right Breast   Chronic fatigue    Cough    Family history of breast cancer    Family history of lung cancer    GERD (gastroesophageal reflux disease)    Heart murmur    years ago, when I was young   Hemangioma    History of hiatal hernia    Hyperlipidemia    IBS (irritable bowel syndrome)    Iron  deficiency anemia    Kyphosis of thoracic region    MVP (mitral valve prolapse)    Osteopenia    Personal history of radiation therapy    SAH (subarachnoid hemorrhage) (HCC)    Vertigo     SURGICAL HISTORY: Past Surgical History:  Procedure Laterality Date   APPENDECTOMY     BREAST BIOPSY Right 11/16/2015   U/S Core- Malignant   BREAST BIOPSY Left 06/21/2005   Stereo- Benign   BREAST EXCISIONAL BIOPSY Left 2007   BREAST LUMPECTOMY Right 12/22/2015   BREAST LUMPECTOMY WITH RADIOACTIVE SEED AND SENTINEL LYMPH NODE BIOPSY Right 12/22/2015   Procedure: BREAST LUMPECTOMY WITH RADIOACTIVE SEED AND SENTINEL LYMPH NODE BIOPSY;  Surgeon: Alm Angle, MD;  Location: [facility];  Service: General;  Laterality: Right;   COLONOSCOPY     FOOT SURGERY Right    bunionectomy   MOUTH SURGERY     [facility]  2003   s/p embolization per Dr. Dolphus   TONSILLECTOMY     TOTAL HIP ARTHROPLASTY Left 01/11/2017   Procedure: LEFT TOTAL HIP ARTHROPLASTY ANTERIOR APPROACH;  Surgeon: Barbarann Oneil BROCKS, MD;  Location: [facility];  Service: Orthopedics;  Laterality: Left;   TOTAL HIP ARTHROPLASTY Right 02/03/2021   Procedure: RIGHT TOTAL HIP ARTHROPLASTY ANTERIOR APPROACH;  Surgeon: Barbarann Oneil BROCKS, MD;  Location: [facility]  OR;  Service: Orthopedics;  Laterality: Right;   TUBAL LIGATION     UPPER GASTROINTESTINAL ENDOSCOPY      SOCIAL HISTORY: Social History   Socioeconomic History   Marital status: Married    Spouse name: Not on file   Number of children: Not on file   Years of education: Not on file   Highest education level: Not on file  Occupational History   Occupation: CSR  Tobacco Use   Smoking status: Never   Smokeless tobacco: Never  Vaping Use   Vaping status: Never Used  Substance and Sexual Activity   Alcohol use: No   Drug use: No   Sexual activity: Not on file  Other Topics Concern   Not on file  Social History Narrative   Not on file   Social Drivers of Health   Financial Resource Strain: Low Risk  (02/22/2024)   Received from [facility]   Overall Financial Resource Strain (CARDIA)  How hard is it for you to pay for the very basics like food, housing, medical care, and heating?: Not hard at all  Food Insecurity: Unknown (02/22/2024)   Received from [facility]   Hunger Vital Sign    Within the past 12 months, you worried that your food would run out before you got the money to buy more.: Never true    Ran Out of Food in the Last Year: Not on file  Transportation Needs: No Transportation Needs (02/22/2024)   Received from [facility] - Transportation    In the past 12 months, has lack of transportation kept you from medical appointments or from getting medications?: No    In the past 12 months, has lack of transportation kept you from meetings, work, or from getting things needed for daily living?: No  Physical Activity: Insufficiently Active (02/22/2024)   Received from [facility]   Exercise Vital Sign    On average, how many days per week do you engage in moderate to strenuous exercise (like a brisk walk)?: 1 day    On average, how many minutes do you engage in exercise at this level?: 30 min  Stress: No Stress Concern Present (08/25/2023)   Received from  [facility] - Occupational Stress Questionnaire    Feeling of Stress : Only a little  Social Connections: Socially Integrated (02/22/2024)   Received from [facility]   Social Network    How would you rate your social network (family, work, friends)?: Good participation with social networks  Intimate Partner Violence: Not At Risk (02/22/2024)   Received from [facility]   HITS    Over the last 12 months how often did your partner physically hurt you?: Never    Over the last 12 months how often did your partner insult you or talk down to you?: Never    Over the last 12 months how often did your partner threaten you with physical harm?: Never    Over the last 12 months how often did your partner scream or curse at you?: Never    FAMILY HISTORY: Family History  Problem Relation Age of Onset   Hyperlipidemia Mother    Breast cancer Mother 8   Heart attack Father    Diabetes Brother    Breast cancer Maternal Aunt        diagnosed 44s or 77s   Breast cancer Paternal Aunt 71       diagnosed in her 64s   Lung cancer Paternal Uncle    Colon cancer Neg Hx    Colon polyps Neg Hx    Esophageal cancer Neg Hx    Rectal cancer Neg Hx    Stomach cancer Neg Hx     ALLERGIES:  is allergic to latex.  MEDICATIONS:  Current Outpatient Medications  Medication Sig Dispense Refill   aspirin  EC 325 MG tablet Take 1 tablet (325 mg total) by mouth daily. MUST TAKE AT LEAST 4 WEEKS POSTOP FOR DVT PROPHYLAXIS. 30 tablet 0   B Complex-C (SUPER B COMPLEX/VITAMIN C PO) Take 1 tablet by mouth every evening.     chlorpheniramine (CHLOR-TRIMETON) 4 MG tablet Take 4 mg by mouth at bedtime.     Cholecalciferol  (VITAMIN D3) 50 MCG (2000 UT) TABS Take 2,000 Units by mouth in the morning.     docusate sodium  (COLACE) 100 MG capsule Take 100 mg by mouth in the morning and at bedtime.  famotidine  (PEPCID ) 20 MG tablet Take 20 mg by mouth.     ferrous gluconate  (FERGON) 324 MG tablet Take 1 tablet (324 mg total) by mouth every other day. 60 tablet 3   omeprazole  (PRILOSEC) 20 MG capsule Take 20 mg by mouth daily.     pravastatin  (PRAVACHOL ) 40 MG tablet TAKE 1 TABLET BY MOUTH EVERY DAY IN THE EVENING 90 tablet 0   tamoxifen  (NOLVADEX ) 20 MG tablet TAKE 1 TABLET BY MOUTH EVERYDAY AT BEDTIME 90 tablet 4   vitamin C (ASCORBIC ACID) 250 MG tablet Take 1 tablet (250 mg total) by mouth every other day. 60 tablet 2   No current facility-administered medications for this visit.    REVIEW OF SYSTEMS:   Review of Systems  Constitutional:  Negative for malaise/fatigue and weight loss.  Musculoskeletal:  Positive for joint pain.  All other systems reviewed and are negative.   PHYSICAL EXAMINATION: ECOG PERFORMANCE STATUS: 1 - Symptomatic but completely ambulatory  There were no vitals filed for this visit.  There were no vitals filed for this visit.  Physical Exam Neurological:     Mental Status: She is alert and oriented to person, place, and time.     LABORATORY DATA:  I have reviewed the data as listed Lab Results  Component Value Date   WBC 5.8 04/17/2024   HGB 12.0 04/17/2024   HCT 37.1 04/17/2024   MCV 90.0 04/17/2024   PLT 186 04/17/2024   Lab Results  Component Value Date   NA 141 04/17/2024   K 3.8 04/17/2024   CL 103 04/17/2024   CO2 29 04/17/2024    RADIOGRAPHIC STUDIES: I have personally reviewed the radiological reports and agreed with the findings in the report.  ASSESSMENT:  1.  Stage I (T1 cN0 G1) right breast cancer, ER positive, PR/HER2 negative: - Right breast 12:00 biopsy (11/16/2015): IDC with calcifications, ER 100%, PR 0%, Ki-67 5%, HER2 negative by FISH - Right lumpectomy and SLNB (12/22/2015): 1.1 cm invasive ductal carcinoma, tumor focally less than 0.1 cm from anterior margin, 0/1 lymph node involved, grade 1, Ki-67 5% - Oncotype DX: Recurrence score 21 with 10-year risk of distant recurrence 14% with  tamoxifen  alone - [facility] testing negative - Adjuvant XRT from 02/06/2016 through 03/05/2016 - Tamoxifen  started in October 2017, to be continued total 10 years  2.  Social/family history: - Lives at home with her husband.  Non-smoker. - She worked at the [employer] and had chlorine exposure.  She worked for [employer] for 28 years. - Mother had breast cancer in her 57s.  PLAN:  Anemia: -Discovered incidentally during lab work from 01/09/2024. -Denies any abnormal bleeding. -CBC from 01/09/2024 shows hemoglobin 11.6 but otherwise unremarkable CBC. -Most recent colonoscopy from 03/05/2019 which showed a normal colon.  Repeat in 10 years. - Labs from 04/17/2024 show improvement of her hemoglobin but her iron  levels are low. -We discussed IV iron  versus oral iron .  She would like to try oral iron  for the next few months to see if this brings her iron  levels up.  Reports she tolerated well in the past.  I have asked that she take it with vitamin C and at least 6 hours from her Protonix . -Recheck labs in 3 to 4 months. -If no improvement, would recommend IV iron .  PLAN SUMMARY: >> Return to clinic in 3 to 4 months to repeat labs   I provided 20 minutes of non face-to-face telephone visit time during this encounter,  and > 50% was spent counseling as documented under my assessment & plan.  All questions were answered. The patient knows to call the clinic with any problems, questions or concerns.    Delon FORBES Hope, NP 04/24/24

## 2024-05-07 ENCOUNTER — Encounter (HOSPITAL_COMMUNITY): Payer: Self-pay | Admitting: Obstetrics and Gynecology

## 2024-05-07 ENCOUNTER — Other Ambulatory Visit: Payer: Self-pay

## 2024-05-07 NOTE — Progress Notes (Signed)
 SDW CALL  Patient was given pre-op instructions over the phone. The opportunity was given for the patient to ask questions. No further questions asked. Patient verbalized understanding of instructions given.   PCP - Suanne Pfeiffer, NP Cardiologist - Mihai Croitoru,MD  PPM/ICD - denies Device Orders -  Rep Notified -   Chest x-ray - na EKG - 02/21/22. Will obtain the DOS.  Stress Test - NM 07/06/05 ECHO - 03/01/22 Cardiac Cath - denies  Sleep Study - denies CPAP - no  Fasting Blood Sugar - na Checks Blood Sugar _____ times a day  Blood Thinner Instructions:na Aspirin  Instructions:pt states she will call Dr. Vinnie office for instructions regarding whether or not he wants her to hold aspirin . She stated that she may have the instructions at home,but she is out and can't remember.   ERAS Protcol -clear liquids until 0430 PRE-SURGERY Ensure or G2-   COVID TEST- na   Anesthesia review: yes- hx murmur,MVP,SAH. Seen by Dr. Francyne 02/21/22;was to follow up in one year but patient has had no follow up.  Patient denies shortness of breath, fever, cough and chest pain over the phone call  Special instructions:    Oral Hygiene is also important to reduce your risk of infection.  Remember - BRUSH YOUR TEETH THE MORNING OF SURGERY WITH YOUR REGULAR TOOTHPASTE

## 2024-05-08 ENCOUNTER — Other Ambulatory Visit: Payer: Self-pay

## 2024-05-08 MED ORDER — TAMOXIFEN CITRATE 20 MG PO TABS: ORAL_TABLET | ORAL | 4 refills | Status: AC

## 2024-05-08 NOTE — Anesthesia Preprocedure Evaluation (Signed)
 Anesthesia Evaluation  Patient identified by MRN, date of birth, ID band Patient awake    Reviewed: Allergy  & Precautions, NPO status , Patient's Chart, lab work & pertinent test results  Airway Mallampati: I  TM Distance: >3 FB Neck ROM: Full    Dental  (+) Teeth Intact, Dental Advisory Given   Pulmonary asthma    breath sounds clear to auscultation       Cardiovascular + Valvular Problems/Murmurs MVP  Rhythm:Regular Rate:Normal     Neuro/Psych negative neurological ROS  negative psych ROS   GI/Hepatic Neg liver ROS, hiatal hernia,GERD  ,,  Endo/Other  negative endocrine ROS    Renal/GU negative Renal ROS     Musculoskeletal  (+) Arthritis ,    Abdominal   Peds  Hematology  (+) Blood dyscrasia, anemia   Anesthesia Other Findings   Reproductive/Obstetrics                              Anesthesia Physical Anesthesia Plan  ASA: 3  Anesthesia Plan: General   Post-op Pain Management: Tylenol  PO (pre-op)*   Induction: Intravenous  PONV Risk Score and Plan: 4 or greater and Ondansetron , Dexamethasone , Midazolam  and Treatment may vary due to age or medical condition  Airway Management Planned: LMA  Additional Equipment: None  Intra-op Plan:   Post-operative Plan: Extubation in OR  Informed Consent: I have reviewed the patients History and Physical, chart, labs and discussed the procedure including the risks, benefits and alternatives for the proposed anesthesia with the patient or authorized representative who has indicated his/her understanding and acceptance.     Dental advisory given  Plan Discussed with: CRNA  Anesthesia Plan Comments: (PAT note written 05/08/2024 by Staisha Winiarski, PA-C.  )         Anesthesia Quick Evaluation

## 2024-05-08 NOTE — Telephone Encounter (Signed)
 She will stay on tamoxifen  for 10 years completing in September 2027 per Delon Hope, NP last note.

## 2024-05-08 NOTE — Progress Notes (Signed)
 Anesthesia Chart Review: Alexis [name]  Case: [id number] Date/Time: 05/11/24 0715   Procedures:      DILATATION & CURETTAGE/HYSTEROSCOPY WITH RESECTOCOPE     MYOSURE RESECTION   Anesthesia type: Choice   Diagnosis: Endometrial polyp [N84.0]   Pre-op diagnosis: endometrial polyp   [facility] RO[facility] / [facility]   Surgeons: Horacio Boas, MD       DISCUSSION: Patient is a 73 year old female scheduled for the above procedure.  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  She had cardiology evaluation by Dr. Jerel Balding on 02/21/2022 for very irregular rhythm noted by PCP at shingles follow-up. PVCs with pause reportedly noted on EKG that was not available for his review. He repeated her EKG which showed NSR with atrial couplet.  He noted previous testing which included, A nuclear stress test was performed in 2007, showed normal perfusion and LVEF.  The diagnosis of MVP is listed on that report.  In 2011 she wore an arrhythmia monitor for 24 hours.  She had rare PVCs and rare PACs and atrial couplets but also 33 brief episodes of SVT with a maximum of 12 beats.  There was no evidence of atrial fibrillation or ventricular tachycardia. She reported only infrequent palpitations/fluttering without symptoms of syncope, SOB, chest pain. He ordered a TTE done on 03/01/2022 that showed LVEF 60-65%, no regional wall motion abnormalities, normal diastolic parameters, normal RV systolic function, normal mitral valve structure, trivial MR.  Overall unremarkable CMP and CBC from 04/17/2024. Her last EKG was in 2023. She is  for updated labs and EKG on arrival as indicated.  Anesthesia team to evaluate on the day of surgery.   VS:  Wt Readings from Last 3 Encounters:  01/16/24 50.1 kg  01/17/23 50.4 kg  02/21/22 50.5 kg   BP Readings from Last 3 Encounters:  01/16/24 135/71  08/13/23 139/75  01/17/23 (!) 153/82   Pulse Readings from Last 3 Encounters:  01/16/24 68  08/13/23 92  01/17/23 64     PROVIDERS: Suanne Pfeiffer, NP is PCP  Geofm Nest, NP is oncology provider  LABS: From day of surgery as indicated.  Most recent labs in [facility] include: Lab Results  Component Value Date   WBC 5.8 04/17/2024   HGB 12.0 04/17/2024   HCT 37.1 04/17/2024   PLT 186 04/17/2024   GLUCOSE 124 (H) 04/17/2024   ALT 18 04/17/2024   AST 34 04/17/2024   NA 141 04/17/2024   K 3.8 04/17/2024   CL 103 04/17/2024   CREATININE 0.78 04/17/2024   BUN 10 04/17/2024   CO2 29 04/17/2024    EKG: For day of surgery as indicated. EKG on 02/21/2022: NSR with atrial couplet   CV: Echo 03/01/2022: IMPRESSIONS   1. Left ventricular ejection fraction, by estimation, is 60 to 65%. The  left ventricle has normal function. The left ventricle has no regional  wall motion abnormalities. Left ventricular diastolic parameters were  normal. The average left ventricular  global longitudinal strain is -24.9 %. The global longitudinal strain is  normal.   2. Right ventricular systolic function is normal. The right ventricular  size is normal.   3. The mitral  valve is normal in structure. Trivial mitral valve  regurgitation. No evidence of mitral stenosis.   4. The aortic valve is tricuspid. Aortic valve regurgitation is not  visualized. No aortic stenosis is present.   5. The inferior vena cava is normal in size with greater than 50%  respiratory variability, suggesting right atrial pressure of 3 mmHg.  - Comparison(s): No prior Echocardiogram.   Nuclear stress test 07/06/2005: Impression: Normal stress nuclear study.   LVEF 72%.  24 Hour Holter monitor 07/07/2009: Impression: Sinus bradycardia, sinus rhythm.  PACs.  Short runs of SVT and occasional PACs with block.  Rare PVCs.  No high-grade heart block or ventricular arrhythmias.   Past Medical History:  Diagnosis Date   Allergy     Arthritis    bilateral hips   Asthma    environmental now resolved   Breast cancer (HCC) 11/16/2015   Right Breast   Chronic fatigue    Cough    Family history of breast cancer    Family history of lung cancer    GERD (gastroesophageal reflux disease)    Heart murmur    years ago, when I was young   Hemangioma    History of hiatal hernia    Hyperlipidemia    IBS (irritable bowel syndrome)    Iron  deficiency anemia    Kyphosis of thoracic region    MVP (mitral valve prolapse)    Osteopenia    Personal history of radiation therapy    SAH (subarachnoid hemorrhage) (HCC)    Vertigo     Past Surgical History:  Procedure Laterality Date   APPENDECTOMY     BREAST BIOPSY Right 11/16/2015   U/S Core- Malignant   BREAST BIOPSY Left 06/21/2005   Stereo- Benign   BREAST EXCISIONAL BIOPSY Left 2007   BREAST LUMPECTOMY Right 12/22/2015   BREAST LUMPECTOMY WITH RADIOACTIVE SEED AND SENTINEL LYMPH NODE BIOPSY Right 12/22/2015   Procedure: BREAST LUMPECTOMY WITH RADIOACTIVE SEED AND SENTINEL LYMPH NODE BIOPSY;  Surgeon: Alm Angle, MD;  Location: [facility];  Service: General;  Laterality: Right;   COLONOSCOPY     FOOT SURGERY Right    bunionectomy   MOUTH SURGERY     [facility]  2003   s/p embolization per Dr. Dolphus   TONSILLECTOMY     TOTAL HIP ARTHROPLASTY Left 01/11/2017   Procedure: LEFT TOTAL HIP ARTHROPLASTY ANTERIOR APPROACH;  Surgeon: Barbarann Oneil BROCKS, MD;  Location: [facility];  Service: Orthopedics;  Laterality: Left;   TOTAL HIP ARTHROPLASTY Right 02/03/2021   Procedure: RIGHT TOTAL HIP ARTHROPLASTY ANTERIOR APPROACH;  Surgeon: Barbarann Oneil BROCKS, MD;  Location: [facility];  Service: Orthopedics;   Laterality: Right;   TUBAL LIGATION     UPPER GASTROINTESTINAL ENDOSCOPY      MEDICATIONS: No current facility-administered medications for this encounter.    aspirin  EC 81 MG tablet   B Complex-C (SUPER B COMPLEX/VITAMIN C  PO)   Cholecalciferol  (VITAMIN D3) 50 MCG (2000 UT) TABS   docusate sodium  (COLACE) 100 MG capsule   esomeprazole  (NEXIUM ) 20 MG capsule   ferrous gluconate  (FERGON) 324 MG tablet   pravastatin  (PRAVACHOL ) 40 MG tablet   chlorpheniramine (CHLOR-TRIMETON) 4 MG tablet   tamoxifen  (NOLVADEX ) 20 MG tablet   vitamin C  (ASCORBIC ACID) 250 MG tablet   She will verify ASA instructions with surgeon if it is not on her paperwork from his office.   Isaiah Ruder, PA-C Surgical Short Stay/Anesthesiology [facility] Phone [phone] [facility] Phone [phone] 05/08/2024 10:53  AM

## 2024-05-10 NOTE — H&P (Signed)
 Alexis [name] is an 73 y.o. female. She has a probable endometrial polyp by pelvic ultrasound, is on tamoxifen  for breast cancer diagnosed in 2017. Benign polyp by endometrial biopsy in 2023. No bleeding or pain  Pertinent Gynecological History: Last mammogram: normal Date: 12/2023 Last pap: normal Date: 2022 OB History: G2, P2002 SVD x 2   Menstrual History: No LMP recorded. Patient is postmenopausal.    Past Medical History:  Diagnosis Date   Allergy     Arthritis    bilateral hips   Asthma    environmental now resolved   Breast cancer (HCC) 11/16/2015   Right Breast   Chronic fatigue    Cough    Family history of breast cancer    Family history of lung cancer    GERD (gastroesophageal reflux disease)    Heart murmur    years ago, when I was young   Hemangioma    History of hiatal hernia    Hyperlipidemia    IBS (irritable bowel syndrome)    Iron  deficiency anemia    Kyphosis of thoracic region    MVP (mitral valve prolapse)    Osteopenia    Personal history of radiation therapy    SAH (subarachnoid hemorrhage) (HCC)    Vertigo     Past Surgical History:  Procedure Laterality Date   APPENDECTOMY     BREAST BIOPSY Right 11/16/2015   U/S Core- Malignant   BREAST BIOPSY Left 06/21/2005   Stereo- Benign   BREAST EXCISIONAL BIOPSY Left 2007   BREAST LUMPECTOMY Right 12/22/2015   BREAST LUMPECTOMY WITH RADIOACTIVE SEED AND SENTINEL LYMPH NODE BIOPSY Right 12/22/2015   Procedure: BREAST LUMPECTOMY WITH RADIOACTIVE SEED AND SENTINEL LYMPH NODE BIOPSY;  Surgeon: Alm Angle, MD;  Location: [facility];  Service: General;  Laterality: Right;   COLONOSCOPY     FOOT SURGERY Right    bunionectomy   MOUTH SURGERY     [facility]  2003   s/p embolization per Dr. Dolphus   TONSILLECTOMY     TOTAL HIP ARTHROPLASTY Left 01/11/2017   Procedure: LEFT TOTAL HIP ARTHROPLASTY ANTERIOR APPROACH;  Surgeon: Barbarann Oneil BROCKS, MD;  Location: [facility] OR;  Service: Orthopedics;   Laterality: Left;   TOTAL HIP ARTHROPLASTY Right 02/03/2021   Procedure: RIGHT TOTAL HIP ARTHROPLASTY ANTERIOR APPROACH;  Surgeon: Barbarann Oneil BROCKS, MD;  Location: [facility] OR;  Service: Orthopedics;  Laterality: Right;   TUBAL LIGATION     UPPER GASTROINTESTINAL ENDOSCOPY      Family History  Problem Relation Age of Onset   Hyperlipidemia Mother    Breast cancer Mother 81   Heart attack Father    Diabetes Brother    Breast cancer Maternal Aunt        diagnosed 93s or 46s   Breast cancer Paternal Aunt 25       diagnosed in her 11s   Lung cancer Paternal Uncle    Colon cancer Neg Hx    Colon polyps Neg Hx    Esophageal cancer Neg Hx    Rectal cancer Neg Hx    Stomach cancer Neg Hx     Social History:  reports that she has never smoked. She has never used smokeless tobacco. She reports that she does not drink alcohol and does not use drugs.  Allergies:  Allergies  Allergen Reactions   Latex Rash    Contact rash    No medications prior to admission.    Review of Systems  Respiratory: Negative.  Cardiovascular: Negative.     There were no vitals taken for this visit. Physical Exam Constitutional:      Appearance: Normal appearance.  Cardiovascular:     Rate and Rhythm: Normal rate and regular rhythm.     Heart sounds: Normal heart sounds. No murmur heard. Pulmonary:     Effort: Pulmonary effort is normal. No respiratory distress.     Breath sounds: Normal breath sounds. No wheezing.  Abdominal:     General: There is no distension.     Palpations: Abdomen is soft. There is no mass.     Tenderness: There is no abdominal tenderness.  Genitourinary:    General: Normal vulva.     Comments: Uterus normal size No adnexal mass Musculoskeletal:     Cervical back: Normal range of motion and neck supple.  Neurological:     Mental Status: She is alert.     No results found for this or any previous visit (from the past 24 hours).  No results  found.  Assessment/Plan: Endometrial polyp, on tamoxifen  for h/o breast cancer, no bleeding. I have recommended removal of polyp, she has declined in the past but now agrees. Surgical procedure, risks, alternatives all discussed, questions answered. Will admit for hysteroscopy, D&C, probable Myosure resection of polyp  Krystal BIRCH Frenchie Dangerfield 05/10/2024, 4:50 PM

## 2024-05-11 ENCOUNTER — Ambulatory Visit (HOSPITAL_COMMUNITY): Payer: Self-pay | Admitting: Vascular Surgery

## 2024-05-11 ENCOUNTER — Encounter (HOSPITAL_COMMUNITY)
Admission: RE | Disposition: A | Discharge status: Home / Self Care | Payer: Self-pay | Source: Home / Self Care | Attending: Obstetrics and Gynecology

## 2024-05-11 ENCOUNTER — Encounter (HOSPITAL_COMMUNITY): Payer: Self-pay | Admitting: Obstetrics and Gynecology

## 2024-05-11 ENCOUNTER — Ambulatory Visit (HOSPITAL_COMMUNITY)
Admission: RE | Admit: 2024-05-11 | Discharge: 2024-05-11 | Disposition: A | Discharge status: Home / Self Care | Attending: Obstetrics and Gynecology | Admitting: Obstetrics and Gynecology

## 2024-05-11 ENCOUNTER — Other Ambulatory Visit: Payer: Self-pay

## 2024-05-11 DIAGNOSIS — Z79810 Long term (current) use of selective estrogen receptor modulators (SERMs): Secondary | ICD-10-CM | POA: Diagnosis not present

## 2024-05-11 DIAGNOSIS — K219 Gastro-esophageal reflux disease without esophagitis: Secondary | ICD-10-CM | POA: Insufficient documentation

## 2024-05-11 DIAGNOSIS — Z853 Personal history of malignant neoplasm of breast: Secondary | ICD-10-CM | POA: Diagnosis not present

## 2024-05-11 DIAGNOSIS — I341 Nonrheumatic mitral (valve) prolapse: Secondary | ICD-10-CM | POA: Diagnosis not present

## 2024-05-11 DIAGNOSIS — E785 Hyperlipidemia, unspecified: Secondary | ICD-10-CM

## 2024-05-11 DIAGNOSIS — N840 Polyp of corpus uteri: Secondary | ICD-10-CM | POA: Insufficient documentation

## 2024-05-11 DIAGNOSIS — C541 Malignant neoplasm of endometrium: Secondary | ICD-10-CM | POA: Diagnosis not present

## 2024-05-11 DIAGNOSIS — Z780 Asymptomatic menopausal state: Secondary | ICD-10-CM | POA: Insufficient documentation

## 2024-05-11 DIAGNOSIS — J45909 Unspecified asthma, uncomplicated: Secondary | ICD-10-CM | POA: Diagnosis not present

## 2024-05-11 HISTORY — PX: MYOSURE RESECTION: SHX7611

## 2024-05-11 HISTORY — PX: DILATATION & CURRETTAGE/HYSTEROSCOPY WITH RESECTOCOPE: SHX5572

## 2024-05-11 LAB — CBC
HCT: 32.9 % — ABNORMAL LOW (ref 36.0–46.0)
Hemoglobin: 10.8 g/dL — ABNORMAL LOW (ref 12.0–15.0)
MCH: 29.3 pg (ref 26.0–34.0)
MCHC: 32.8 g/dL (ref 30.0–36.0)
MCV: 89.2 fL (ref 80.0–100.0)
Platelets: 146 K/uL — ABNORMAL LOW (ref 150–400)
RBC: 3.69 MIL/uL — ABNORMAL LOW (ref 3.87–5.11)
RDW: 13.2 % (ref 11.5–15.5)
WBC: 4.8 K/uL (ref 4.0–10.5)
nRBC: 0 % (ref 0.0–0.2)

## 2024-05-11 SURGERY — DILATATION & CURETTAGE/HYSTEROSCOPY WITH RESECTOCOPE
Anesthesia: General

## 2024-05-11 MED ORDER — LIDOCAINE HCL 2 % IJ SOLN
INTRAMUSCULAR | Status: DC | PRN
  Administered 2024-05-11: 10 mL

## 2024-05-11 MED ORDER — ONDANSETRON HCL 4 MG/2ML IJ SOLN
INTRAMUSCULAR | Status: DC | PRN
  Administered 2024-05-11: 4 mg via INTRAVENOUS

## 2024-05-11 MED ORDER — LACTATED RINGERS IV SOLN: INTRAVENOUS | Status: DC

## 2024-05-11 MED ORDER — LIDOCAINE 2% (20 MG/ML) 5 ML SYRINGE
INTRAMUSCULAR | Status: DC | PRN
  Administered 2024-05-11: 40 mg via INTRAVENOUS

## 2024-05-11 MED ORDER — SODIUM CHLORIDE 0.9 % IR SOLN
Status: DC | PRN
  Administered 2024-05-11: 3000 mL

## 2024-05-11 MED ORDER — OXYCODONE HCL 5 MG/5ML PO SOLN: 5.0000 mg | Freq: Once | ORAL | Status: DC | PRN

## 2024-05-11 MED ORDER — ACETAMINOPHEN 10 MG/ML IV SOLN
1000.0000 mg | Freq: Once | INTRAVENOUS | Status: DC | PRN
Start: 2024-05-11 — End: 2024-05-11

## 2024-05-11 MED ORDER — PROPOFOL 10 MG/ML IV BOLUS
INTRAVENOUS | Status: AC
  Filled 2024-05-11: qty 20

## 2024-05-11 MED ORDER — ORAL CARE MOUTH RINSE: 15.0000 mL | Freq: Once | OROMUCOSAL | Status: AC

## 2024-05-11 MED ORDER — LIDOCAINE 2% (20 MG/ML) 5 ML SYRINGE
INTRAMUSCULAR | Status: AC
Start: 2024-05-11 — End: 2024-05-11
  Filled 2024-05-11: qty 5

## 2024-05-11 MED ORDER — ONDANSETRON HCL 4 MG/2ML IJ SOLN
INTRAMUSCULAR | Status: AC
  Filled 2024-05-11: qty 2

## 2024-05-11 MED ORDER — OXYCODONE HCL 5 MG PO TABS: 5.0000 mg | ORAL_TABLET | Freq: Once | ORAL | Status: DC | PRN

## 2024-05-11 MED ORDER — DROPERIDOL 2.5 MG/ML IJ SOLN: 0.6250 mg | Freq: Once | INTRAMUSCULAR | Status: DC | PRN

## 2024-05-11 MED ORDER — PROPOFOL 10 MG/ML IV BOLUS
INTRAVENOUS | Status: DC | PRN
  Administered 2024-05-11: 120 mg via INTRAVENOUS

## 2024-05-11 MED ORDER — FENTANYL CITRATE (PF) 100 MCG/2ML IJ SOLN: 25.0000 ug | INTRAMUSCULAR | Status: DC | PRN

## 2024-05-11 MED ORDER — ACETAMINOPHEN 500 MG PO TABS: 1000.0000 mg | ORAL_TABLET | Freq: Once | ORAL | Status: DC

## 2024-05-11 MED ORDER — DEXAMETHASONE SOD PHOSPHATE PF 10 MG/ML IJ SOLN
INTRAMUSCULAR | Status: DC | PRN
  Administered 2024-05-11: 5 mg via INTRAVENOUS

## 2024-05-11 MED ORDER — FENTANYL CITRATE (PF) 100 MCG/2ML IJ SOLN
INTRAMUSCULAR | Status: AC
  Filled 2024-05-11: qty 2

## 2024-05-11 MED ORDER — FENTANYL CITRATE (PF) 250 MCG/5ML IJ SOLN
INTRAMUSCULAR | Status: DC | PRN
  Administered 2024-05-11: 50 ug via INTRAVENOUS

## 2024-05-11 MED ORDER — CHLORHEXIDINE GLUCONATE 0.12 % MT SOLN
15.0000 mL | Freq: Once | OROMUCOSAL | Status: AC
Start: 2024-05-11 — End: 2024-05-11
  Administered 2024-05-11: 15 mL via OROMUCOSAL
  Filled 2024-05-11: qty 15

## 2024-05-11 MED ORDER — LIDOCAINE HCL 2 % IJ SOLN
INTRAMUSCULAR | Status: AC
  Filled 2024-05-11: qty 20

## 2024-05-11 SURGICAL SUPPLY — 15 items
CATH ROBINSON RED A/P 16FR (CATHETERS) IMPLANT
COVER MAYO STAND STRL (DRAPES) ×1 IMPLANT
DEVICE MYOSURE LITE (MISCELLANEOUS) IMPLANT
DEVICE MYOSURE REACH (MISCELLANEOUS) IMPLANT
DILATOR CANAL MILEX (MISCELLANEOUS) IMPLANT
GLOVE PI ORTHO PRO STRL 7.5 (GLOVE) ×1 IMPLANT
GLOVE SURG UNDER POLY LF SZ7 (GLOVE) ×1 IMPLANT
GOWN STRL REUS W/ TWL XL LVL3 (GOWN DISPOSABLE) ×1 IMPLANT
KIT PROCED FLUENT PRO FLT212S (KITS) ×1 IMPLANT
KIT TURNOVER KIT B (KITS) ×1 IMPLANT
PACK VAGINAL MINOR WOMEN LF (CUSTOM PROCEDURE TRAY) ×1 IMPLANT
PAD OB MATERNITY 11 LF (PERSONAL CARE ITEMS) ×1 IMPLANT
SEAL ROD LENS SCOPE MYOSURE (ABLATOR) ×1 IMPLANT
TOWEL GREEN STERILE FF (TOWEL DISPOSABLE) ×1 IMPLANT
UNDERPAD 30X36 HEAVY ABSORB (UNDERPADS AND DIAPERS) ×1 IMPLANT

## 2024-05-11 NOTE — Anesthesia Procedure Notes (Signed)
 Procedure Name: LMA Insertion Date/Time: 05/11/2024 7:40 AM  Performed by: Mohit Zirbes J, CRNAPre-anesthesia Checklist: Patient identified, Emergency Drugs available, Suction available and Patient being monitored Patient Re-evaluated:Patient Re-evaluated prior to induction Oxygen Delivery Method: Circle System Utilized Preoxygenation: Pre-oxygenation with 100% oxygen Induction Type: IV induction Ventilation: Mask ventilation without difficulty LMA: LMA inserted LMA Size: 4.0 Number of attempts: 1 Airway Equipment and Method: Bite block Placement Confirmation: positive ETCO2 Tube secured with: Tape Dental Injury: Teeth and Oropharynx as per pre-operative assessment

## 2024-05-11 NOTE — Transfer of Care (Signed)
 Immediate Anesthesia Transfer of Care Note  Patient: Alexis [name]  Procedure(s) Performed: DILATATION & CURETTAGE/HYSTEROSCOPY WITH RESECTOCOPE MYOSURE RESECTION  Patient Location: PACU  Anesthesia Type:General  Level of Consciousness: drowsy  Airway & Oxygen Therapy: Patient Spontanous Breathing and Patient connected to face mask oxygen  Post-op Assessment: Report given to RN and Post -op Vital signs reviewed and stable  Post vital signs: Reviewed and stable  Last Vitals:  Vitals Value Taken Time  BP 147/72 05/11/24 08:30  Temp    Pulse 45 05/11/24 08:31  Resp 12 05/11/24 08:31  SpO2 100 % 05/11/24 08:31  Vitals shown include unfiled device data.  Last Pain:  Vitals:   05/11/24 0622  TempSrc:   PainSc: 0-No pain         Complications: No notable events documented.

## 2024-05-11 NOTE — Interval H&P Note (Signed)
 History and Physical Interval Note:  05/11/2024 7:19 AM  Alexis [name]  has presented today for surgery, with the diagnosis of endometrial polyp.  The various methods of treatment have been discussed with the patient and family. After consideration of risks, benefits and other options for treatment, the patient has consented to  Procedure(s): DILATATION & CURETTAGE/HYSTEROSCOPY WITH RESECTOCOPE (N/A) MYOSURE RESECTION (N/A) as a surgical intervention.  The patient's history has been reviewed, patient examined, no change in status, stable for surgery.  I have reviewed the patient's chart and labs.  Questions were answered to the patient's satisfaction.     Alexis [name]

## 2024-05-11 NOTE — Anesthesia Postprocedure Evaluation (Signed)
 Anesthesia Post Note  Patient: Alexis [name]  Procedure(s) Performed: DILATATION & CURETTAGE/HYSTEROSCOPY WITH RESECTOCOPE MYOSURE RESECTION     Patient location during evaluation: PACU Anesthesia Type: General Level of consciousness: awake and alert Pain management: pain level controlled Vital Signs Assessment: post-procedure vital signs reviewed and stable Respiratory status: spontaneous breathing, nonlabored ventilation, respiratory function stable and patient connected to nasal cannula oxygen Cardiovascular status: blood pressure returned to baseline and stable Postop Assessment: no apparent nausea or vomiting Anesthetic complications: no   No notable events documented.  Last Vitals:  Vitals:   05/11/24 0845 05/11/24 0902  BP: (!) 154/88 (!) 164/82  Pulse: (!) 49   Resp: 16 12  Temp:  36.5 C  SpO2: 97%     Last Pain:  Vitals:   05/11/24 0902  TempSrc:   PainSc: 0-No pain                 Franky JONETTA Bald

## 2024-05-11 NOTE — Discharge Instructions (Signed)
Routine instructions for hysteroscopy 

## 2024-05-11 NOTE — Op Note (Signed)
 Preoperative diagnosis: Endometrial polyp Postoperative diagnosis: Same Procedure: Hysteroscopy with Myosure resection of polyp Surgeon: Krystal Deaner M.D. Anesthesia: Gen. With an LMA, paracervical block Findings: She had a normal endometrial cavity with a large poly originating from 9-10:00 Estimated blood loss: Minimal Fluid deficit: Through the hysteroscope fluid deficit was about 305 cc Specimens: Endometrial resection sent for routine pathology Complications: None  Procedure in detail: The patient was taken to the operating room and placed in the dorsosupine position. General anesthesia was induced. She was placed in mobile stirrups and legs were elevated. Perineum and vagina were prepped and draped in the usual sterile fashion. A Graves speculum was inserted in the vagina and the anterior lip of the cervix was grasped with a single-tooth tenaculum. Paracervical block was performed with a total of 10 cc of 2% plain lidocaine . The uterus then sounded to 8 cm. The cervix was gradually dilated to a size 23 dilator without difficulty. The Myosure hysteroscope was inserted and good visualization was achieved.  The polyp was filling most of the cavity.  The Myosure Reach was inserted and the polyp was completely resected, as well as some small pieces of endometrial tissue.  The endometrial cavity was now normal, no other visible lesions.  The hysteroscope was removed. Sharp curettage performed with return of no tissue and small blood. The single-tooth tenaculum was removed from the cervix and bleeding was controlled with pressure. All instruments were then removed from the vagina. The patient tolerated the procedure well and was taken to the recovery in stable condition. Counts were correct and she had PAS hose on throughout the procedure.

## 2024-05-12 ENCOUNTER — Encounter (HOSPITAL_COMMUNITY): Payer: Self-pay | Admitting: Obstetrics and Gynecology

## 2024-05-15 LAB — SURGICAL PATHOLOGY

## 2024-05-19 ENCOUNTER — Telehealth: Payer: Self-pay

## 2024-05-19 ENCOUNTER — Encounter: Payer: Self-pay | Admitting: Gynecologic Oncology

## 2024-05-19 NOTE — Telephone Encounter (Signed)
 Spoke with Alexis [name] regarding her referral to GYN oncology. She has an appointment scheduled with Dr. Viktoria on 12/4 at 1:00. Patient agrees to date and time. She has been provided with office address and location. She is also aware of our mask and visitor policy. Patient verbalized understanding and will call with any questions.

## 2024-05-20 ENCOUNTER — Encounter: Payer: Self-pay | Admitting: Gynecologic Oncology

## 2024-05-20 NOTE — H&P (View-Only) (Signed)
 GYNECOLOGIC ONCOLOGY NEW PATIENT CONSULTATION   Patient Name: Alexis [name]  Patient Age: 73 y.o. 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  We reviewed the nature of endometrial cancer and its recommended surgical staging, including total hysterectomy, bilateral salpingo-oophorectomy, and lymph node assessment. The patient is a suitable candidate for staging via a minimally invasive approach to surgery.  We reviewed that robotic assistance would be used to complete the surgery.   We discussed that most endometrial cancer is detected early and that decisions regarding adjuvant therapy will be made based on her final pathology.    We reviewed the sentinel lymph node technique. Risks and benefits of sentinel lymph node biopsy was reviewed. We reviewed the technique and ICG dye. The patient DOES NOT have an iodine allergy  or known liver dysfunction. We reviewed the false negative rate (0.4%), and that 3% of patients with metastatic disease will not have it detected by SLN biopsy in endometrial cancer. A low risk of allergic reaction to the dye, <0.2% for ICG, has been reported. We also discussed that in the case of failed mapping, which occurs 40% of the time, a bilateral or unilateral lymphadenectomy will be performed at the surgeons discretion.   Potential benefits of sentinel nodes including a higher detection rate for metastasis due to ultrastaging and potential reduction in operative morbidity. However, there remains uncertainty as to the role for treatment of micrometastatic disease. Further, the benefit of operative morbidity associated with the SLN technique in endometrial cancer is not yet completely known. In other patient populations (e.g. the cervical cancer population) there has  been observed reductions in morbidity with SLN biopsy compared to pelvic lymphadenectomy. Lymphedema, nerve dysfunction and lymphocysts are all potential risks with the SLN technique as with complete lymphadenectomy. Additional risks to the patient include the risk of damage to an internal organ while operating in an altered view (e.g. the black and white image of the robotic fluorescence imaging mode).   We discussed the plan for a robotic assisted hysterectomy, bilateral salpingo-oophorectomy, sentinel lymph node evaluation, possible lymph node dissection, possible laparotomy. The risks of surgery were discussed in detail and she understands these to include infection; wound separation; hernia; vaginal cuff separation, injury to adjacent organs such as bowel, bladder, blood vessels, ureters and nerves; bleeding which may require blood transfusion; anesthesia risk; thromboembolic events; possible death; unforeseen complications; possible need for re-exploration; medical complications such as heart attack, stroke, pleural effusion and pneumonia; and, if full lymphadenectomy is performed the risk of lymphedema and lymphocyst. The patient will receive DVT and antibiotic prophylaxis as indicated. She voiced a clear understanding. She had the opportunity to ask questions. Perioperative instructions were reviewed with her. Prescriptions for post-op medications were sent to her pharmacy of choice.  My office will reach out to her primary care provider for clearance.  We will touch base with anesthesia regarding her history of an aneurysm (> 20 years ago, asymptomatic).  A copy of this note was sent to the patient's referring provider.   65 minutes of total time was spent for this patient encounter, including preparation, face-to-face counseling with the patient and coordination of care, and documentation of the encounter.  Comer Dollar, MD  Division of Gynecologic Oncology  Department of Obstetrics and  Gynecology  [facility]  ___________________________________________  Chief Complaint: No  chief complaint on file.   History of Present Illness:  Alexis [name] is a 73 y.o. y.o. female who is seen in consultation at the request of Dr. Horacio for an evaluation of endometrial cancer.  The patient has been followed for some time for intermittent vaginal bleeding.  She cannot remember when this first started.  She occasionally sees blood when she wipes after voiding.  Denies any blood on pad or in underwear.  Her biopsy history with her OB/GYN as noted below.  EMB 02/27/2019: benign endocervical glands, no endometrial tissue. EMB 03/11/19: minute fragments of benign endometrium. EMB 07/29/19: endometrial type polyp, scant strips of inactive endometrium. EMB 08/15/20: Uterine polyp, no malignancy. EMB 03/27/21: Scant strips of inactive endometrium, no overt malignancy. EMB 11/03/2021 showed endometrial polyp, fragments of inactive endometrium, no hyperplasaa or malignancy. 05/11/24: Hysteroscopy with polypectomy using Myosure.  Pathology revealed scattered foci of FIGO grade 1 endometrioid carcinoma arising in a polyp. MMRp.  She has been on tamoxifen  for approximately 7 years.  Her breast cancer was treated with surgery followed by radiation and then antiestrogen treatment.  Today, she presents with her husband.  She endorses a good appetite without nausea or emesis.  Has some occasional constipation at baseline, denies any recent change to her bowel function.  Denies any urinary symptoms.  History is notable for ruptured aneurysm over 20 years ago.  PAST MEDICAL HISTORY:  Past Medical History:  Diagnosis Date   Allergy     Arthritis    bilateral hips   Asthma    environmental now resolved   Brain aneurysm    rupture, coiled, in 2003   Breast cancer (HCC) 11/16/2015   Right Breast   Chronic fatigue    Cough    Family history of breast cancer    Family  history of lung cancer    GERD (gastroesophageal reflux disease)    Heart murmur    years ago, when I was young   Hemangioma    History of hiatal hernia    Hyperlipidemia    IBS (irritable bowel syndrome)    Iron  deficiency anemia    Kyphosis of thoracic region    MVP (mitral valve prolapse)    Osteopenia    Personal history of radiation therapy    SAH (subarachnoid hemorrhage) (HCC)    Vertigo      PAST SURGICAL HISTORY:  Past Surgical History:  Procedure Laterality Date   APPENDECTOMY     BREAST BIOPSY Right 11/16/2015   U/S Core- Malignant   BREAST BIOPSY Left 06/21/2005   Stereo- Benign   BREAST EXCISIONAL BIOPSY Left 2007   BREAST LUMPECTOMY Right 12/22/2015   BREAST LUMPECTOMY WITH RADIOACTIVE SEED AND SENTINEL LYMPH NODE BIOPSY Right 12/22/2015   Procedure: BREAST LUMPECTOMY WITH RADIOACTIVE SEED AND SENTINEL LYMPH NODE BIOPSY;  Surgeon: Alm Angle, MD;  Location: [facility];  Service: General;  Laterality: Right;   COLONOSCOPY     DILATATION & CURRETTAGE/HYSTEROSCOPY WITH RESECTOCOPE N/A 05/11/2024   Procedure: DILATATION & CURETTAGE/HYSTEROSCOPY WITH RESECTOCOPE;  Surgeon: Horacio Boas, MD;  Location: [facility] OR;  Service: Gynecology;  Laterality: N/A;   FOOT SURGERY Right    bunionectomy   MOUTH SURGERY     MYOSURE RESECTION N/A 05/11/2024   Procedure: MELINDA RESECTION;  Surgeon: Horacio Boas, MD;  Location: [facility] OR;  Service: Gynecology;  Laterality: N/A;   SAH  2003   s/p embolization per Dr. Dolphus   TONSILLECTOMY     TOTAL HIP ARTHROPLASTY Left  01/11/2017   Procedure: LEFT TOTAL HIP ARTHROPLASTY ANTERIOR APPROACH;  Surgeon: Barbarann Oneil BROCKS, MD;  Location: [facility] OR;  Service: Orthopedics;  Laterality: Left;   TOTAL HIP ARTHROPLASTY Right 02/03/2021   Procedure: RIGHT TOTAL HIP ARTHROPLASTY ANTERIOR APPROACH;  Surgeon: Barbarann Oneil BROCKS, MD;  Location: [facility] OR;  Service: Orthopedics;  Laterality: Right;   TUBAL LIGATION     UPPER GASTROINTESTINAL  ENDOSCOPY      OB/GYN HISTORY:  OB History  Gravida Para Term Preterm AB Living  2 2      SAB IAB Ectopic Multiple Live Births          # Outcome Date GA Lbr Len/2nd Weight Sex Type Anes PTL Lv  2 Para           1 Para             No LMP recorded. Patient is postmenopausal.  Age at menarche: 15  Age at menopause: 68 Hx of HRT: denies Hx of STDs: denies Last pap: 2019  SCREENING STUDIES:  Last mammogram: 2025  Last colonoscopy: 2020 in our records (2024 per patient report)  MEDICATIONS: Outpatient Encounter Medications as of 05/21/2024  Medication Sig   aspirin  EC 81 MG tablet Take 81 mg by mouth daily. Swallow whole.   B Complex-C (SUPER B COMPLEX/VITAMIN C  PO) Take 1 tablet by mouth every evening.   Cholecalciferol  (VITAMIN D3) 50 MCG (2000 UT) TABS Take 2,000 Units by mouth in the morning.   docusate sodium  (COLACE) 100 MG capsule Take 100 mg by mouth daily at 12 noon.   esomeprazole  (NEXIUM ) 20 MG capsule Take 20 mg by mouth 2 (two) times daily.   ferrous gluconate  (FERGON) 324 MG tablet Take 1 tablet (324 mg total) by mouth every other day.   pravastatin  (PRAVACHOL ) 40 MG tablet TAKE 1 TABLET BY MOUTH EVERY DAY IN THE EVENING   tamoxifen  (NOLVADEX ) 20 MG tablet TAKE 1 TABLET BY MOUTH EVERYDAY AT BEDTIME   [DISCONTINUED] chlorpheniramine (CHLOR-TRIMETON) 4 MG tablet Take 4 mg by mouth at bedtime. (Patient not taking: Reported on 05/05/2024)   [DISCONTINUED] vitamin C  (ASCORBIC ACID) 250 MG tablet Take 1 tablet (250 mg total) by mouth every other day. (Patient not taking: Reported on 05/05/2024)   No facility-administered encounter medications on file as of 05/21/2024.    ALLERGIES:  Allergies  Allergen Reactions   Latex Rash    Contact rash     FAMILY HISTORY:  Family History  Problem Relation Age of Onset   Hyperlipidemia Mother    Breast cancer Mother 40   Heart attack Father    Diabetes Brother    Breast cancer Maternal Aunt        diagnosed 44s or 68s    Breast cancer Paternal Aunt 63       diagnosed in her 41s   Lung cancer Paternal Uncle    Colon cancer Neg Hx    Colon polyps Neg Hx    Esophageal cancer Neg Hx    Rectal cancer Neg Hx    Stomach cancer Neg Hx      SOCIAL HISTORY:  Social Connections: Socially Integrated (02/22/2024)   Received from [employer]   Social Network    How would you rate your social network (family, work, friends)?: Good participation with social networks    REVIEW OF SYSTEMS:  + slight cough, constipation Denies appetite changes, fevers, chills, fatigue, unexplained weight changes. Denies hearing loss, neck lumps or masses, mouth sores, ringing in  ears or voice changes. Denies wheezing.  Denies shortness of breath. Denies chest pain or palpitations. Denies leg swelling. Denies abdominal distention, pain, blood in stools, diarrhea, nausea, vomiting, or early satiety. Denies pain with intercourse, dysuria, frequency, hematuria or incontinence. Denies hot flashes, pelvic pain, vaginal bleeding or vaginal discharge.   Denies joint pain, back pain or muscle pain/cramps. Denies itching, rash, or wounds. Denies dizziness, headaches, numbness or seizures. Denies swollen lymph nodes or glands, denies easy bruising or bleeding. Denies anxiety, depression, confusion, or decreased concentration.  Physical Exam:  Vital Signs for this encounter:  Blood pressure (!) 148/76, pulse 71, temperature 98.3 F (36.8 C), temperature source Oral, resp. rate 14, height 5' 2 (1.575 m), weight 107 lb (48.5 kg), SpO2 98%. Body mass index is 19.57 kg/m. General: Alert, oriented, no acute distress.  HEENT: Normocephalic, atraumatic. Sclera anicteric.  Chest: Clear to auscultation bilaterally. No wheezes, rhonchi, or rales. Cardiovascular: Regular rate and rhythm, no murmurs, rubs, or gallops.  Abdomen: Normoactive bowel sounds. Soft, nondistended, nontender to palpation. No masses or hepatosplenomegaly appreciated. No  palpable fluid wave.  Extremities: Grossly normal range of motion. Warm, well perfused. No edema bilaterally.  Skin: No rashes or lesions.  Lymphatics: No cervical, supraclavicular, or inguinal adenopathy.  GU:  Normal external female genitalia. No lesions. No discharge or bleeding.             Bladder/urethra:  No lesions or masses, well supported bladder             Vagina: Mildly atrophic, no lesions.             Cervix: Normal appearing, no lesions.             Uterus: Small, mobile, no parametrial involvement or nodularity.             Adnexa: No masses appreciated.  Rectal: Deferred.  LABORATORY AND RADIOLOGIC DATA:  Outside medical records were reviewed to synthesize the above history, along with the history and physical obtained during the visit.   Lab Results  Component Value Date   WBC 4.8 05/11/2024   HGB 10.8 (L) 05/11/2024   HCT 32.9 (L) 05/11/2024   PLT 146 (L) 05/11/2024   GLUCOSE 124 (H) 04/17/2024   CHOL 164 07/17/2021   TRIG 135 07/17/2021   HDL 47 07/17/2021   LDLDIRECT 151.4 07/04/2010   LDLCALC 93 07/17/2021   ALT 18 04/17/2024   AST 34 04/17/2024   NA 141 04/17/2024   K 3.8 04/17/2024   CL 103 04/17/2024   CREATININE 0.78 04/17/2024   BUN 10 04/17/2024   CO2 29 04/17/2024   TSH 1.930 07/17/2021

## 2024-05-20 NOTE — Progress Notes (Unsigned)
 GYNECOLOGIC ONCOLOGY NEW PATIENT CONSULTATION   Patient Name: Alexis [name]  Patient Age: 73 y.o. 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  We reviewed the nature of endometrial cancer and its recommended surgical staging, including total hysterectomy, bilateral salpingo-oophorectomy, and lymph node assessment. The patient is a suitable candidate for staging via a minimally invasive approach to surgery.  We reviewed that robotic assistance would be used to complete the surgery.   We discussed that most endometrial cancer is detected early and that decisions regarding adjuvant therapy will be made based on her final pathology.    We reviewed the sentinel lymph node technique. Risks and benefits of sentinel lymph node biopsy was reviewed. We reviewed the technique and ICG dye. The patient DOES NOT have an iodine allergy  or known liver dysfunction. We reviewed the false negative rate (0.4%), and that 3% of patients with metastatic disease will not have it detected by SLN biopsy in endometrial cancer. A low risk of allergic reaction to the dye, <0.2% for ICG, has been reported. We also discussed that in the case of failed mapping, which occurs 40% of the time, a bilateral or unilateral lymphadenectomy will be performed at the surgeon's discretion.   Potential benefits of sentinel nodes including a higher detection rate for metastasis due to ultrastaging and potential reduction in operative morbidity. However, there remains uncertainty as to the role for treatment of micrometastatic disease. Further, the benefit of operative morbidity associated with the SLN technique in endometrial cancer is not yet completely known. In other patient populations (e.g. the cervical cancer population) there has  been observed reductions in morbidity with SLN biopsy compared to pelvic lymphadenectomy. Lymphedema, nerve dysfunction and lymphocysts are all potential risks with the SLN technique as with complete lymphadenectomy. Additional risks to the patient include the risk of damage to an internal organ while operating in an altered view (e.g. the black and white image of the robotic fluorescence imaging mode).   We discussed the plan for a robotic assisted hysterectomy, bilateral salpingo-oophorectomy, sentinel lymph node evaluation, possible lymph node dissection, possible laparotomy. The risks of surgery were discussed in detail and she understands these to include infection; wound separation; hernia; vaginal cuff separation, injury to adjacent organs such as bowel, bladder, blood vessels, ureters and nerves; bleeding which may require blood transfusion; anesthesia risk; thromboembolic events; possible death; unforeseen complications; possible need for re-exploration; medical complications such as heart attack, stroke, pleural effusion and pneumonia; and, if full lymphadenectomy is performed the risk of lymphedema and lymphocyst. The patient will receive DVT and antibiotic prophylaxis as indicated. She voiced a clear understanding. She had the opportunity to ask questions. Perioperative instructions were reviewed with her. Prescriptions for post-op medications were sent to her pharmacy of choice.  My office will reach out to her primary care provider for clearance.  We will touch base with anesthesia regarding her history of an aneurysm (> 20 years ago, asymptomatic).  A copy of this note was sent to the patient's referring provider.   65 minutes of total time was spent for this patient encounter, including preparation, face-to-face counseling with the patient and coordination of care, and documentation of the encounter.  Comer Dollar, MD  Division of Gynecologic Oncology  Department of Obstetrics and  Gynecology  [facility]  ___________________________________________  Chief Complaint: No  chief complaint on file.   History of Present Illness:  Alexis [name] is a 73 y.o. y.o. female who is seen in consultation at the request of Dr. Horacio for an evaluation of endometrial cancer.  The patient has been followed for some time for intermittent vaginal bleeding.  She cannot remember when this first started.  She occasionally sees blood when she wipes after voiding.  Denies any blood on pad or in underwear.  Her biopsy history with her OB/GYN as noted below.  EMB 02/27/2019: benign endocervical glands, no endometrial tissue. EMB 03/11/19: minute fragments of benign endometrium. EMB 07/29/19: endometrial type polyp, scant strips of inactive endometrium. EMB 08/15/20: Uterine polyp, no malignancy. EMB 03/27/21: Scant strips of inactive endometrium, no overt malignancy. EMB 11/03/2021 showed endometrial polyp, fragments of inactive endometrium, no hyperplasaa or malignancy. 05/11/24: Hysteroscopy with polypectomy using Myosure.  Pathology revealed scattered foci of FIGO grade 1 endometrioid carcinoma arising in a polyp. MMRp.  She has been on tamoxifen  for approximately 7 years.  Her breast cancer was treated with surgery followed by radiation and then antiestrogen treatment.  Today, she presents with her husband.  She endorses a good appetite without nausea or emesis.  Has some occasional constipation at baseline, denies any recent change to her bowel function.  Denies any urinary symptoms.  History is notable for ruptured aneurysm over 20 years ago.  PAST MEDICAL HISTORY:  Past Medical History:  Diagnosis Date   Allergy     Arthritis    bilateral hips   Asthma    environmental now resolved   Brain aneurysm    rupture, coiled, in 2003   Breast cancer (HCC) 11/16/2015   Right Breast   Chronic fatigue    Cough    Family history of breast cancer    Family  history of lung cancer    GERD (gastroesophageal reflux disease)    Heart murmur    years ago, when I was young   Hemangioma    History of hiatal hernia    Hyperlipidemia    IBS (irritable bowel syndrome)    Iron  deficiency anemia    Kyphosis of thoracic region    MVP (mitral valve prolapse)    Osteopenia    Personal history of radiation therapy    SAH (subarachnoid hemorrhage) (HCC)    Vertigo      PAST SURGICAL HISTORY:  Past Surgical History:  Procedure Laterality Date   APPENDECTOMY     BREAST BIOPSY Right 11/16/2015   U/S Core- Malignant   BREAST BIOPSY Left 06/21/2005   Stereo- Benign   BREAST EXCISIONAL BIOPSY Left 2007   BREAST LUMPECTOMY Right 12/22/2015   BREAST LUMPECTOMY WITH RADIOACTIVE SEED AND SENTINEL LYMPH NODE BIOPSY Right 12/22/2015   Procedure: BREAST LUMPECTOMY WITH RADIOACTIVE SEED AND SENTINEL LYMPH NODE BIOPSY;  Surgeon: Alm Angle, MD;  Location: [facility];  Service: General;  Laterality: Right;   COLONOSCOPY     DILATATION & CURRETTAGE/HYSTEROSCOPY WITH RESECTOCOPE N/A 05/11/2024   Procedure: DILATATION & CURETTAGE/HYSTEROSCOPY WITH RESECTOCOPE;  Surgeon: Horacio Boas, MD;  Location: [facility] OR;  Service: Gynecology;  Laterality: N/A;   FOOT SURGERY Right    bunionectomy   MOUTH SURGERY     MYOSURE RESECTION N/A 05/11/2024   Procedure: MELINDA RESECTION;  Surgeon: Horacio Boas, MD;  Location: [facility] OR;  Service: Gynecology;  Laterality: N/A;   SAH  2003   s/p embolization per Dr. Dolphus   TONSILLECTOMY     TOTAL HIP ARTHROPLASTY Left  01/11/2017   Procedure: LEFT TOTAL HIP ARTHROPLASTY ANTERIOR APPROACH;  Surgeon: Barbarann Oneil BROCKS, MD;  Location: [facility] OR;  Service: Orthopedics;  Laterality: Left;   TOTAL HIP ARTHROPLASTY Right 02/03/2021   Procedure: RIGHT TOTAL HIP ARTHROPLASTY ANTERIOR APPROACH;  Surgeon: Barbarann Oneil BROCKS, MD;  Location: [facility] OR;  Service: Orthopedics;  Laterality: Right;   TUBAL LIGATION     UPPER GASTROINTESTINAL  ENDOSCOPY      OB/GYN HISTORY:  OB History  Gravida Para Term Preterm AB Living  2 2      SAB IAB Ectopic Multiple Live Births          # Outcome Date GA Lbr Len/2nd Weight Sex Type Anes PTL Lv  2 Para           1 Para             No LMP recorded. Patient is postmenopausal.  Age at menarche: 70  Age at menopause: 71 Hx of HRT: denies Hx of STDs: denies Last pap: 2019  SCREENING STUDIES:  Last mammogram: 2025  Last colonoscopy: 2020 in our records (2024 per patient report)  MEDICATIONS: Outpatient Encounter Medications as of 05/21/2024  Medication Sig   aspirin  EC 81 MG tablet Take 81 mg by mouth daily. Swallow whole.   B Complex-C (SUPER B COMPLEX/VITAMIN C  PO) Take 1 tablet by mouth every evening.   Cholecalciferol  (VITAMIN D3) 50 MCG (2000 UT) TABS Take 2,000 Units by mouth in the morning.   docusate sodium  (COLACE) 100 MG capsule Take 100 mg by mouth daily at 12 noon.   esomeprazole  (NEXIUM ) 20 MG capsule Take 20 mg by mouth 2 (two) times daily.   ferrous gluconate  (FERGON) 324 MG tablet Take 1 tablet (324 mg total) by mouth every other day.   pravastatin  (PRAVACHOL ) 40 MG tablet TAKE 1 TABLET BY MOUTH EVERY DAY IN THE EVENING   tamoxifen  (NOLVADEX ) 20 MG tablet TAKE 1 TABLET BY MOUTH EVERYDAY AT BEDTIME   [DISCONTINUED] chlorpheniramine (CHLOR-TRIMETON) 4 MG tablet Take 4 mg by mouth at bedtime. (Patient not taking: Reported on 05/05/2024)   [DISCONTINUED] vitamin C  (ASCORBIC ACID) 250 MG tablet Take 1 tablet (250 mg total) by mouth every other day. (Patient not taking: Reported on 05/05/2024)   No facility-administered encounter medications on file as of 05/21/2024.    ALLERGIES:  Allergies  Allergen Reactions   Latex Rash    Contact rash     FAMILY HISTORY:  Family History  Problem Relation Age of Onset   Hyperlipidemia Mother    Breast cancer Mother 29   Heart attack Father    Diabetes Brother    Breast cancer Maternal Aunt        diagnosed 53s or 34s    Breast cancer Paternal Aunt 52       diagnosed in her 8s   Lung cancer Paternal Uncle    Colon cancer Neg Hx    Colon polyps Neg Hx    Esophageal cancer Neg Hx    Rectal cancer Neg Hx    Stomach cancer Neg Hx      SOCIAL HISTORY:  Social Connections: Socially Integrated (02/22/2024)   Received from [employer]   Social Network    How would you rate your social network (family, work, friends)?: Good participation with social networks    REVIEW OF SYSTEMS:  + slight cough, constipation Denies appetite changes, fevers, chills, fatigue, unexplained weight changes. Denies hearing loss, neck lumps or masses, mouth sores, ringing in  ears or voice changes. Denies wheezing.  Denies shortness of breath. Denies chest pain or palpitations. Denies leg swelling. Denies abdominal distention, pain, blood in stools, diarrhea, nausea, vomiting, or early satiety. Denies pain with intercourse, dysuria, frequency, hematuria or incontinence. Denies hot flashes, pelvic pain, vaginal bleeding or vaginal discharge.   Denies joint pain, back pain or muscle pain/cramps. Denies itching, rash, or wounds. Denies dizziness, headaches, numbness or seizures. Denies swollen lymph nodes or glands, denies easy bruising or bleeding. Denies anxiety, depression, confusion, or decreased concentration.  Physical Exam:  Vital Signs for this encounter:  Blood pressure (!) 148/76, pulse 71, temperature 98.3 F (36.8 C), temperature source Oral, resp. rate 14, height 5' 2 (1.575 m), weight 107 lb (48.5 kg), SpO2 98%. Body mass index is 19.57 kg/m. General: Alert, oriented, no acute distress.  HEENT: Normocephalic, atraumatic. Sclera anicteric.  Chest: Clear to auscultation bilaterally. No wheezes, rhonchi, or rales. Cardiovascular: Regular rate and rhythm, no murmurs, rubs, or gallops.  Abdomen: Normoactive bowel sounds. Soft, nondistended, nontender to palpation. No masses or hepatosplenomegaly appreciated. No  palpable fluid wave.  Extremities: Grossly normal range of motion. Warm, well perfused. No edema bilaterally.  Skin: No rashes or lesions.  Lymphatics: No cervical, supraclavicular, or inguinal adenopathy.  GU:  Normal external female genitalia. No lesions. No discharge or bleeding.             Bladder/urethra:  No lesions or masses, well supported bladder             Vagina: Mildly atrophic, no lesions.             Cervix: Normal appearing, no lesions.             Uterus: Small, mobile, no parametrial involvement or nodularity.             Adnexa: No masses appreciated.  Rectal: Deferred.  LABORATORY AND RADIOLOGIC DATA:  Outside medical records were reviewed to synthesize the above history, along with the history and physical obtained during the visit.   Lab Results  Component Value Date   WBC 4.8 05/11/2024   HGB 10.8 (L) 05/11/2024   HCT 32.9 (L) 05/11/2024   PLT 146 (L) 05/11/2024   GLUCOSE 124 (H) 04/17/2024   CHOL 164 07/17/2021   TRIG 135 07/17/2021   HDL 47 07/17/2021   LDLDIRECT 151.4 07/04/2010   LDLCALC 93 07/17/2021   ALT 18 04/17/2024   AST 34 04/17/2024   NA 141 04/17/2024   K 3.8 04/17/2024   CL 103 04/17/2024   CREATININE 0.78 04/17/2024   BUN 10 04/17/2024   CO2 29 04/17/2024   TSH 1.930 07/17/2021

## 2024-05-21 ENCOUNTER — Inpatient Hospital Stay: Admitting: Gynecologic Oncology

## 2024-05-21 ENCOUNTER — Encounter: Payer: Self-pay | Admitting: Gynecologic Oncology

## 2024-05-21 ENCOUNTER — Inpatient Hospital Stay: Attending: Hematology | Admitting: Gynecologic Oncology

## 2024-05-21 VITALS — BP 148/76 | HR 71 | Temp 98.3°F | Resp 14 | Ht 62.0 in | Wt 107.0 lb

## 2024-05-21 DIAGNOSIS — Z79810 Long term (current) use of selective estrogen receptor modulators (SERMs): Secondary | ICD-10-CM | POA: Insufficient documentation

## 2024-05-21 DIAGNOSIS — Z79899 Other long term (current) drug therapy: Secondary | ICD-10-CM | POA: Diagnosis not present

## 2024-05-21 DIAGNOSIS — R5382 Chronic fatigue, unspecified: Secondary | ICD-10-CM | POA: Insufficient documentation

## 2024-05-21 DIAGNOSIS — C541 Malignant neoplasm of endometrium: Secondary | ICD-10-CM | POA: Diagnosis present

## 2024-05-21 DIAGNOSIS — E785 Hyperlipidemia, unspecified: Secondary | ICD-10-CM | POA: Insufficient documentation

## 2024-05-21 DIAGNOSIS — M40204 Unspecified kyphosis, thoracic region: Secondary | ICD-10-CM | POA: Diagnosis not present

## 2024-05-21 DIAGNOSIS — N950 Postmenopausal bleeding: Secondary | ICD-10-CM | POA: Diagnosis not present

## 2024-05-21 DIAGNOSIS — K589 Irritable bowel syndrome without diarrhea: Secondary | ICD-10-CM | POA: Diagnosis not present

## 2024-05-21 DIAGNOSIS — Z170 Estrogen receptor positive status [ER+]: Secondary | ICD-10-CM | POA: Insufficient documentation

## 2024-05-21 DIAGNOSIS — K219 Gastro-esophageal reflux disease without esophagitis: Secondary | ICD-10-CM | POA: Diagnosis not present

## 2024-05-21 DIAGNOSIS — Z923 Personal history of irradiation: Secondary | ICD-10-CM | POA: Diagnosis not present

## 2024-05-21 DIAGNOSIS — I341 Nonrheumatic mitral (valve) prolapse: Secondary | ICD-10-CM | POA: Insufficient documentation

## 2024-05-21 DIAGNOSIS — C50411 Malignant neoplasm of upper-outer quadrant of right female breast: Secondary | ICD-10-CM | POA: Diagnosis not present

## 2024-05-21 DIAGNOSIS — M8580 Other specified disorders of bone density and structure, unspecified site: Secondary | ICD-10-CM | POA: Diagnosis not present

## 2024-05-21 MED ORDER — SENNOSIDES-DOCUSATE SODIUM 8.6-50 MG PO TABS: 2.0000 | ORAL_TABLET | Freq: Every day | ORAL | 0 refills | Status: AC

## 2024-05-21 MED ORDER — TRAMADOL HCL 50 MG PO TABS: 50.0000 mg | ORAL_TABLET | Freq: Four times a day (QID) | ORAL | 0 refills | Status: DC | PRN

## 2024-05-21 NOTE — Patient Instructions (Addendum)
 Preparing for your Surgery  Plan for surgery on May 28, 2024 with Dr. Comer Dollar at [facility]. You will be scheduled for robotic assisted total laparoscopic hysterectomy (removal of the uterus and cervix), bilateral salpingo-oophorectomy (removal of both ovaries and fallopian tubes), sentinel lymph node biopsy, possible lymph node dissection, possible laparotomy (larger incision on your abdomen if needed).   Pre-operative Testing -You will receive a phone call from presurgical testing at [facility] to arrange for a pre-operative appointment and lab work.  -Bring your insurance card, copy of an advanced directive if applicable, medication list  -At that visit, you will be asked to sign a consent for a possible blood transfusion in case a transfusion becomes necessary during surgery.  The need for a blood transfusion is rare but having consent is a necessary part of your care.     -You can continue taking baby aspirin  with your last dose being the day before surgery in the am. Do not take the morning of surgery.  -Do not take supplements such as fish oil (omega 3), red yeast rice, turmeric before your surgery. STOP TAKING AT LEAST 10 DAYS BEFORE SURGERY. You want to avoid medications with aspirin  in them including headache powders such as BC or Goody's), Excedrin migraine.  -If you are taking a GLP-1 medication/injection such as Ozempic, Mounjaro, [id number], this needs to be held before surgery for at least 7 days before.  Day Before Surgery at Home -You will be asked to take in a light diet the day before surgery. You will be advised you can have clear liquids up until 3 hours before your surgery.    Eat a light diet the day before surgery.  Examples including soups, broths, toast, yogurt, mashed potatoes.  AVOID GAS PRODUCING FOODS AND BEVERAGES. Things to avoid include carbonated beverages (fizzy beverages, sodas), raw fruits and raw vegetables (uncooked), or beans.    If your bowels are filled with gas, your surgeon will have difficulty visualizing your pelvic organs which increases your surgical risks.  Your role in recovery Your role is to become active as soon as directed by your doctor, while still giving yourself time to heal.  Rest when you feel tired. You will be asked to do the following in order to speed your recovery:  - Cough and breathe deeply. This helps to clear and expand your lungs and can prevent pneumonia after surgery.  - STAY ACTIVE WHEN YOU GET HOME. Do mild physical activity. Walking or moving your legs help your circulation and body functions return to normal. Do not try to get up or walk alone the first time after surgery.   -If you develop swelling on one leg or the other, pain in the back of your leg, redness/warmth in one of your legs, please call the office or go to the Emergency Room to have a doppler to rule out a blood clot. For shortness of breath, chest pain-seek care in the Emergency Room as soon as possible. - Actively manage your pain. Managing your pain lets you move in comfort. We will ask you to rate your pain on a scale of zero to 10. It is your responsibility to tell your doctor or nurse where and how much you hurt so your pain can be treated.  Special Considerations -If you are diabetic, you may be placed on insulin after surgery to have closer control over your blood sugars to promote healing and recovery.  This does not mean that you  will be discharged on insulin.  If applicable, your oral antidiabetics will be resumed when you are tolerating a solid diet.  -Your final pathology results from surgery should be available around one week after surgery and the results will be relayed to you when available.  -FMLA forms can be faxed to [phone] and please allow 5-7 business days for completion.  Pain Management After Surgery -You will be prescribed your pain medication and bowel regimen medications before surgery so  that you can have these available when you are discharged from the hospital. The pain medication is for use ONLY AFTER surgery and a new prescription will not be given.   -Make sure that you have Tylenol  and Ibuprofen IF YOU ARE ABLE TO TAKE THESE MEDICATIONS at home to use on a regular basis after surgery for pain control. We recommend alternating the medications every hour to six hours since they work differently and are processed in the body differently for pain relief.  -Review the attached handout on narcotic use and their risks and side effects.   Bowel Regimen -You will be prescribed Sennakot-S to take nightly to prevent constipation especially if you are taking the narcotic pain medication intermittently.  It is important to prevent constipation and drink adequate amounts of liquids. You can stop taking this medication when you are not taking pain medication and you are back on your normal bowel routine.  Risks of Surgery Risks of surgery are low but include bleeding, infection, damage to surrounding structures, re-operation, blood clots, and very rarely death.   Blood Transfusion Information (For the consent to be signed before surgery)  We will be checking your blood type before surgery so in case of emergencies, we will know what type of blood you would need.                                            WHAT IS A BLOOD TRANSFUSION?  A transfusion is the replacement of blood or some of its parts. Blood is made up of multiple cells which provide different functions. Red blood cells carry oxygen and are used for blood loss replacement. White blood cells fight against infection. Platelets control bleeding. Plasma helps clot blood. Other blood products are available for specialized needs, such as hemophilia or other clotting disorders. BEFORE THE TRANSFUSION  Who gives blood for transfusions?  You may be able to donate blood to be used at a later date on yourself (autologous  donation). Relatives can be asked to donate blood. This is generally not any safer than if you have received blood from a stranger. The same precautions are taken to ensure safety when a relative's blood is donated. Healthy volunteers who are fully evaluated to make sure their blood is safe. This is blood bank blood. Transfusion therapy is the safest it has ever been in the practice of medicine. Before blood is taken from a donor, a complete history is taken to make sure that person has no history of diseases nor engages in risky social behavior (examples are intravenous drug use or sexual activity with multiple partners). The donor's travel history is screened to minimize risk of transmitting infections, such as malaria. The donated blood is tested for signs of infectious diseases, such as HIV and hepatitis. The blood is then tested to be sure it is compatible with you in order to minimize the chance  of a transfusion reaction. If you or a relative donates blood, this is often done in anticipation of surgery and is not appropriate for emergency situations. It takes many days to process the donated blood. RISKS AND COMPLICATIONS Although transfusion therapy is very safe and saves many lives, the main dangers of transfusion include:  Getting an infectious disease. Developing a transfusion reaction. This is an allergic reaction to something in the blood you were given. Every precaution is taken to prevent this. The decision to have a blood transfusion has been considered carefully by your caregiver before blood is given. Blood is not given unless the benefits outweigh the risks.  AFTER SURGERY INSTRUCTIONS  Return to work: 4-6 weeks if applicable  Activity: 1. Be up and out of the bed during the day.  Take a nap if needed.  You may walk up steps but be careful and use the hand rail.  Stair climbing will tire you more than you think, you may need to stop part way and rest.   2. No lifting or straining  for 6 weeks over 10 pounds. No pushing, pulling, straining for 6 weeks.  3. No driving for 4-89 days when the following criteria have been met: Do not drive if you are taking narcotic pain medicine and make sure that your reaction time has returned.   4. You can shower as soon as the next day after surgery. Shower daily.  Use your regular soap and water (not directly on the incision) and pat your incision(s) dry afterwards; don't rub.  No tub baths or submerging your body in water until cleared by your surgeon. If you have the soap that was given to you by pre-surgical testing that was used before surgery, you do not need to use it afterwards because this can irritate your incisions.   5. No sexual activity and nothing in the vagina for 12 weeks.  6. You may experience a small amount of clear drainage from your incisions, which is normal.  If the drainage persists, increases, or changes color please call the office.  7. Do not use creams, lotions, or ointments such as neosporin on your incisions after surgery until advised by your surgeon because they can cause removal of the dermabond glue on your incisions.    8. You may experience vaginal spotting after surgery or when the stitches at the top of the vagina begin to dissolve.  The spotting is normal but if you experience heavy bleeding, call our office.  9. Take Tylenol  or ibuprofen first for pain if you are able to take these medications and only use narcotic pain medication for severe pain not relieved by the Tylenol  or Ibuprofen.  Monitor your Tylenol  intake to a max of 4,000 mg in a 24 hour period. You can alternate these medications after surgery.  Diet: 1. Low sodium Heart Healthy Diet is recommended but you are cleared to resume your normal (before surgery) diet after your procedure.  2. It is safe to use a laxative, such as Miralax  or Colace, if you have difficulty moving your bowels before surgery. You have been prescribed Sennakot-S to  take at bedtime every evening after surgery to keep bowel movements regular and to prevent constipation.    Wound Care: 1. Keep clean and dry.  Shower daily.  Reasons to call the Doctor: Fever - Oral temperature greater than 100.4 degrees Fahrenheit Foul-smelling vaginal discharge Difficulty urinating Nausea and vomiting Increased pain at the site of the incision that is  unrelieved with pain medicine. Difficulty breathing with or without chest pain New calf pain especially if only on one side Sudden, continuing increased vaginal bleeding with or without clots.   Contacts: For questions or concerns you should contact:  Dr. Comer Dollar at [phone]  Eleanor Epps, NP at [phone]  After Hours: call [phone] and have the GYN Oncologist paged/contacted (after 5 pm or on the weekends). You will speak with an after hours RN and let he or she know you have had surgery.  Messages sent via mychart are for non-urgent matters and are not responded to after hours so for urgent needs, please call the after hours number.

## 2024-05-21 NOTE — Progress Notes (Signed)
 Patient here for a consult with Dr. Viktoria and for a pre-operative appointment prior to her scheduled surgery on 05/28/2024. She is scheduled for a robotic assisted total laparoscopic hysterectomy, bilateral salpingo-oophorectomy, sentinel lymph node biopsy, possible lymph node dissection, possible laparotomy. The surgery was discussed in detail.  See after visit summary for additional details.      Discussed post-op pain management in detail including the aspects of the enhanced recovery pathway.  Advised her that a new prescription would be sent in for Tramadol and it is only to be used for after her upcoming surgery.  We discussed the use of tylenol  post-op and to monitor for a maximum of 4,000 mg in a 24 hour period.  Also let her know that sennakot will be prescribed to be used after surgery and to hold if having loose stools.  Discussed bowel regimen in detail.     Discussed the use of SCDs and measures to take at home to prevent DVT including frequent mobility.  Reportable signs and symptoms of DVT discussed. Post-operative instructions discussed and expectations for after surgery. Incisional care discussed as well including reportable signs and symptoms including erythema, drainage, wound separation.     30 minutes spent with the patient.  Verbalizing understanding of material discussed. No needs or concerns voiced at the end of the visit.   Advised patient to call for any needs.  Advised that her post-operative medications had been prescribed and could be picked up at any time.    This appointment is included in the global surgical bundle as pre-operative teaching and has no charge.

## 2024-05-22 ENCOUNTER — Encounter: Payer: Self-pay | Admitting: Obstetrics and Gynecology

## 2024-05-22 ENCOUNTER — Other Ambulatory Visit: Payer: Self-pay | Admitting: Gynecologic Oncology

## 2024-05-22 DIAGNOSIS — C541 Malignant neoplasm of endometrium: Secondary | ICD-10-CM

## 2024-05-25 ENCOUNTER — Telehealth: Payer: Self-pay | Admitting: *Deleted

## 2024-05-25 NOTE — Telephone Encounter (Signed)
 Per Dr Viktoria fax surgical optimization form to the patient's PCP office [place], [place] [phone])

## 2024-05-25 NOTE — Patient Instructions (Addendum)
 SURGICAL WAITING ROOM VISITATION Patients having surgery or a procedure may have no more than 2 support people in the waiting area - these visitors may rotate.    Children under the age of 3 must have an adult with them who is not the patient.  If the patient needs to stay at the hospital during part of their recovery, the visitor guidelines for inpatient rooms apply. Pre-op nurse will coordinate an appropriate time for 1 support person to accompany patient in pre-op.  This support person may not rotate.    Please refer to the [facility] website for the visitor guidelines for Inpatients (after your surgery is over and you are in a regular room).       Your procedure is scheduled on: 05-28-24   Report to [facility] Main Entrance    Report to admitting at 5:15 AM   Call this number if you have problems the morning of surgery [phone]   Follow a light diet the day before surgery (avoid gas producing foods)   Do not eat food :After Midnight.   After Midnight you may have the following liquids until 4:30 AM DAY OF SURGERY  Water Non-Citrus Juices (without pulp, NO RED-Apple, White grape, White cranberry) Black Coffee (NO MILK/CREAM OR CREAMERS, sugar ok)  Clear Tea (NO MILK/CREAM OR CREAMERS, sugar ok) regular and decaf                             Plain Jell-O (NO RED)                                           Fruit ices (not with fruit pulp, NO RED)                                     Popsicles (NO RED)                                                               Sports drinks like Gatorade (NO RED)                       If you have questions, please contact your surgeon's office.   FOLLOW ANY ADDITIONAL PRE OP INSTRUCTIONS YOU RECEIVED FROM YOUR SURGEON'S OFFICE!!!     Oral Hygiene is also important to reduce your risk of infection.                                    Remember - BRUSH YOUR TEETH THE MORNING OF SURGERY WITH YOUR REGULAR TOOTHPASTE   Do NOT  smoke after Midnight   Take these medicines the morning of surgery with A SIP OF WATER:    Esomeprazole    Pepcid   Stop all vitamins and herbal supplements 7 days before surgery  Bring CPAP mask and tubing day of surgery.  You may not have any metal on your body including hair pins, jewelry, and body piercing             Do not wear make-up, lotions, powders, perfumes or deodorant  Do not wear nail polish including gel and S&S, artificial/acrylic nails, or any other type of covering on natural nails including finger and toenails. If you have artificial nails, gel coating, etc. that needs to be removed by a nail salon please have this removed prior to surgery or surgery may need to be canceled/ delayed if the surgeon/ anesthesia feels like they are unable to be safely monitored.   Do not shave  48 hours prior to surgery.        Do not bring valuables to the hospital. [facility] IS NOT RESPONSIBLE   FOR VALUABLES.   Contacts, dentures or bridgework may not be worn into surgery.  DO NOT BRING YOUR HOME MEDICATIONS TO THE HOSPITAL. PHARMACY WILL DISPENSE MEDICATIONS LISTED ON YOUR MEDICATION LIST TO YOU DURING YOUR ADMISSION IN THE HOSPITAL!    Patients discharged on the day of surgery will not be allowed to drive home.  Someone NEEDS to stay with you for the first 24 hours after anesthesia.   Special Instructions: Bring a copy of your healthcare power of attorney and living will documents the day of surgery if you haven't scanned them before.              Please read over the following fact sheets you were given: IF YOU HAVE QUESTIONS ABOUT YOUR PRE-OP INSTRUCTIONS PLEASE CALL [phone] G[facility]  If you received a COVID test during your pre-op visit  it is requested that you wear a mask when out in public, stay away from anyone that may not be feeling well and notify your surgeon if you develop symptoms. If you test positive for Covid or have been in contact  with anyone that has tested positive in the last 10 days please notify you surgeon.  [facility] - Preparing for Surgery Before surgery, you can play an important role.  Because skin is not sterile, your skin needs to be as free of germs as possible.  You can reduce the number of germs on your skin by washing with CHG (chlorahexidine gluconate) soap before surgery.  CHG is an antiseptic cleaner which kills germs and bonds with the skin to continue killing germs even after washing. Please DO NOT use if you have an allergy  to CHG or antibacterial soaps.  If your skin becomes reddened/irritated stop using the CHG and inform your nurse when you arrive at Short Stay. Do not shave (including legs and underarms) for at least 48 hours prior to the first CHG shower.  You may shave your face/neck.  Please follow these instructions carefully:  1.  Shower with CHG Soap the night before surgery ONLY (DO NOT USE THE SOAP THE MORNING OF SURGERY).  2.  If you choose to wash your hair, wash your hair first as usual with your normal  shampoo.  3.  After you shampoo, rinse your hair and body thoroughly to remove the shampoo.                             4.  Use CHG as you would any other liquid soap.  You can apply chg directly to the skin and wash.  Gently with a scrungie or clean washcloth.  5.  Apply the  CHG Soap to your body ONLY FROM THE NECK DOWN.   Do   not use on face/ open                           Wound or open sores. Avoid contact with eyes, ears mouth and   genitals (private parts).                       Wash face,  Genitals (private parts) with your normal soap.             6.  Wash thoroughly, paying special attention to the area where your    surgery  will be performed.  7.  Thoroughly rinse your body with warm water from the neck down.  8.  DO NOT shower/wash with your normal soap after using and rinsing off the CHG Soap.                9.  Pat yourself dry with a clean towel.            10.  Wear  clean pajamas.            11.  Place clean sheets on your bed the night of your first shower and do not  sleep with pets. Day of Surgery : Do not apply any CHG, lotions/deodorants the morning of surgery.  Please wear clean clothes to the hospital/surgery center.  FAILURE TO FOLLOW THESE INSTRUCTIONS MAY RESULT IN THE CANCELLATION OF YOUR SURGERY  PATIENT SIGNATURE_________________________________  NURSE SIGNATURE__________________________________  ________________________________________________________________________ WHAT IS A BLOOD TRANSFUSION? Blood Transfusion Information  A transfusion is the replacement of blood or some of its parts. Blood is made up of multiple cells which provide different functions. Red blood cells carry oxygen and are used for blood loss replacement. White blood cells fight against infection. Platelets control bleeding. Plasma helps clot blood. Other blood products are available for specialized needs, such as hemophilia or other clotting disorders. BEFORE THE TRANSFUSION  Who gives blood for transfusions?  Healthy volunteers who are fully evaluated to make sure their blood is safe. This is blood bank blood. Transfusion therapy is the safest it has ever been in the practice of medicine. Before blood is taken from a donor, a complete history is taken to make sure that person has no history of diseases nor engages in risky social behavior (examples are intravenous drug use or sexual activity with multiple partners). The donor's travel history is screened to minimize risk of transmitting infections, such as malaria. The donated blood is tested for signs of infectious diseases, such as HIV and hepatitis. The blood is then tested to be sure it is compatible with you in order to minimize the chance of a transfusion reaction. If you or a relative donates blood, this is often done in anticipation of surgery and is not appropriate for emergency situations. It takes many days  to process the donated blood. RISKS AND COMPLICATIONS Although transfusion therapy is very safe and saves many lives, the main dangers of transfusion include:  Getting an infectious disease. Developing a transfusion reaction. This is an allergic reaction to something in the blood you were given. Every precaution is taken to prevent this. The decision to have a blood transfusion has been considered carefully by your caregiver before blood is given. Blood is not given unless the benefits outweigh the risks. AFTER THE TRANSFUSION Right after receiving a blood transfusion, you  will usually feel much better and more energetic. This is especially true if your red blood cells have gotten low (anemic). The transfusion raises the level of the red blood cells which carry oxygen, and this usually causes an energy increase. The nurse administering the transfusion will monitor you carefully for complications. HOME CARE INSTRUCTIONS  No special instructions are needed after a transfusion. You may find your energy is better. Speak with your caregiver about any limitations on activity for underlying diseases you may have. SEEK MEDICAL CARE IF:  Your condition is not improving after your transfusion. You develop redness or irritation at the intravenous (IV) site. SEEK IMMEDIATE MEDICAL CARE IF:  Any of the following symptoms occur over the next 12 hours: Shaking chills. You have a temperature by mouth above 102 F (38.9 C), not controlled by medicine. Chest, back, or muscle pain. People around you feel you are not acting correctly or are confused. Shortness of breath or difficulty breathing. Dizziness and fainting. You get a rash or develop hives. You have a decrease in urine output. Your urine turns a dark color or changes to pink, red, or brown. Any of the following symptoms occur over the next 10 days: You have a temperature by mouth above 102 F (38.9 C), not controlled by medicine. Shortness of  breath. Weakness after normal activity. The white part of the eye turns yellow (jaundice). You have a decrease in the amount of urine or are urinating less often. Your urine turns a dark color or changes to pink, red, or brown. Document Released: 06/01/2000 Document Revised: 08/27/2011 Document Reviewed: 01/19/2008 [facility] Patient Information 2014 [place], [place].  _______________________________________________________________________

## 2024-05-25 NOTE — Progress Notes (Signed)
 Date of COVID positive in last 90 days:  PCP - Charmaine Heller, NP Cardiologist - Jerel Balding, MD (last OV 2023)  Chest x-ray - N/A EKG - 05-11-24 Epic Stress Test - 07-06-05 Epic ECHO - 03-01-22 Epic Cardiac Cath - N/A Pacemaker/ICD device last checked:N/A Spinal Cord Stimulator:N/A  Bowel Prep - N/A  Sleep Study - N/A CPAP -   Fasting Blood Sugar - N/A Checks Blood Sugar _____ times a day  Last dose of GLP1 agonist-  N/A GLP1 instructions:  Do not take after     Last dose of SGLT-2 inhibitors-  N/A SGLT-2 instructions:  Do not take after     Blood Thinner Instructions: N/A Last dose:   Time: Aspirin  Instructions:N/A Last Dose:  Activity level:  Can go up a flight of stairs and perform activities of daily living without stopping and without symptoms of chest pain or shortness of breath.  Able to exercise without symptoms  Unable to go up a flight of stairs without symptoms of     Anesthesia review:  hx of cardiac eval for MVP, palpitations  Patient denies shortness of breath, fever, cough and chest pain at PAT appointment  Patient verbalized understanding of instructions that were given to them at the PAT appointment. Patient was also instructed that they will need to review over the PAT instructions again at home before surgery.

## 2024-05-26 ENCOUNTER — Telehealth: Payer: Self-pay | Admitting: *Deleted

## 2024-05-26 ENCOUNTER — Encounter (HOSPITAL_COMMUNITY)
Admission: RE | Admit: 2024-05-26 | Discharge: 2024-05-26 | Disposition: A | Source: Ambulatory Visit | Attending: Gynecologic Oncology

## 2024-05-26 ENCOUNTER — Other Ambulatory Visit: Payer: Self-pay

## 2024-05-26 ENCOUNTER — Encounter (HOSPITAL_COMMUNITY): Payer: Self-pay

## 2024-05-26 DIAGNOSIS — C541 Malignant neoplasm of endometrium: Secondary | ICD-10-CM

## 2024-05-26 HISTORY — DX: Malignant neoplasm of endometrium: C54.1

## 2024-05-26 HISTORY — DX: Other complications of anesthesia, initial encounter: T88.59XA

## 2024-05-26 LAB — CBC
HCT: 34.2 % — ABNORMAL LOW (ref 36.0–46.0)
Hemoglobin: 11.1 g/dL — ABNORMAL LOW (ref 12.0–15.0)
MCH: 29.2 pg (ref 26.0–34.0)
MCHC: 32.5 g/dL (ref 30.0–36.0)
MCV: 90 fL (ref 80.0–100.0)
Platelets: 170 K/uL (ref 150–400)
RBC: 3.8 MIL/uL — ABNORMAL LOW (ref 3.87–5.11)
RDW: 13.3 % (ref 11.5–15.5)
WBC: 4.5 K/uL (ref 4.0–10.5)
nRBC: 0 % (ref 0.0–0.2)

## 2024-05-26 LAB — COMPREHENSIVE METABOLIC PANEL WITH GFR
ALT: 21 U/L (ref 0–44)
AST: 32 U/L (ref 15–41)
Albumin: 3.9 g/dL (ref 3.5–5.0)
Alkaline Phosphatase: 44 U/L (ref 38–126)
Anion gap: 10 (ref 5–15)
BUN: 10 mg/dL (ref 8–23)
CO2: 26 mmol/L (ref 22–32)
Calcium: 8.9 mg/dL (ref 8.9–10.3)
Chloride: 105 mmol/L (ref 98–111)
Creatinine, Ser: 0.79 mg/dL (ref 0.44–1.00)
GFR, Estimated: >60 mL/min (ref >60)
Glucose, Bld: 99 mg/dL (ref 70–99)
Potassium: 3.4 mmol/L — ABNORMAL LOW (ref 3.5–5.1)
Sodium: 141 mmol/L (ref 135–145)
Total Bilirubin: 0.4 mg/dL (ref 0.0–1.2)
Total Protein: 6.5 g/dL (ref 6.5–8.1)

## 2024-05-26 NOTE — Telephone Encounter (Signed)
 Spoke with patient who had her pre-admit appt. This morning at 1000. Advised patient to stop taking her daily aspirin  81mg  today, tomorrow and the day of surgery on Thursday, 12/11. Pt states she took her dose this morning. Reiterated to not take aspirin  tomorrow or Thursday am and Dr. Viktoria will advise when to resume after her surgery. Pt verbalized understanding. Pt also reminded that the office will call her tomorrow for pre-op call. Pt thanked the office.

## 2024-05-26 NOTE — Progress Notes (Signed)
 Anesthesia Chart Review   Case: [id number] Date/Time: 05/28/24 0715   Procedures:      HYSTERECTOMY, TOTAL, ROBOT-ASSISTED, LAPAROSCOPIC, WITH BILATERAL SALPINGO-OOPHORECTOMY (Bilateral) - POSSIBLE LAPAROTOMY     INJECTION, FOR SENTINEL LYMPH NODE IDENTIFICATION     LYMPH NODE BIOPSY     LYMPHADENECTOMY, PELVIS, ROBOT-ASSISTED   Anesthesia type: General   Pre-op diagnosis: ENDOMETRIAL CANCER   Location: [facility] ROOM 05 / [facility]   Surgeons: Viktoria Comer SAUNDERS, MD       DISCUSSION:73 y.o. never smoker with h/o GERD, brain aneurysm coiled in 2003, MVP, endometrial cancer scheduled for above procedure 05/28/24 with Dr. Comer Viktoria.   S/p D&C with myosure resection 05/11/2024, no anesthesia complications noted.   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   She had cardiology evaluation by Dr. Jerel Balding on 02/21/2022 for very irregular rhythm noted by PCP at shingles follow-up. PVCs with pause reportedly noted on EKG that was not available for his review. He repeated her EKG which showed NSR with atrial couplet.  He noted previous testing which included, A nuclear stress test was performed in 2007, showed normal perfusion and LVEF.  The diagnosis of MVP is listed on that report.  In 2011 she wore an arrhythmia monitor for 24 hours.  She had rare PVCs and rare PACs and atrial couplets but also 33 brief episodes of SVT with a maximum of 12 beats.  There was no evidence of atrial fibrillation or ventricular tachycardia. She reported only infrequent palpitations/fluttering  without symptoms of syncope, SOB, chest pain. He ordered a TTE done on 03/01/2022 that showed LVEF 60-65%, no regional wall motion abnormalities, normal diastolic parameters, normal RV systolic function, normal mitral valve structure, trivial MR. VS: BP (!) 154/77   Pulse 67   Temp 36.9 C (Oral)   Resp 16   Ht 5' 5 (1.651 m)   Wt 48.5 kg   SpO2 99%   BMI 17.81 kg/m   PROVIDERS: Suanne Pfeiffer, NP is PCP    LABS: Labs reviewed: Acceptable for surgery. (all labs ordered are listed, but only abnormal results are displayed)  Labs Reviewed  CBC - Abnormal; Notable for the following components:      Result Value   RBC 3.80 (*)    Hemoglobin 11.1 (*)    HCT 34.2 (*)    All other components within normal limits  COMPREHENSIVE METABOLIC PANEL WITH GFR - Abnormal; Notable for the following components:   Potassium 3.4 (*)    All other components within normal limits  TYPE AND SCREEN     IMAGES:   EKG:   CV: Echo 03/01/2022  1. Left ventricular ejection fraction, by estimation, is 60 to 65%. The  left ventricle has normal function. The left ventricle has no regional  wall motion abnormalities. Left ventricular diastolic parameters were  normal. The average left ventricular  global longitudinal strain is -24.9 %. The global longitudinal strain is  normal.   2. Right ventricular systolic function is normal. The right ventricular  size is normal.   3. The mitral valve is normal in structure. Trivial mitral valve  regurgitation. No evidence of mitral stenosis.  4. The aortic valve is tricuspid. Aortic valve regurgitation is not  visualized. No aortic stenosis is present.   5. The inferior vena cava is normal in size with greater than 50%  respiratory variability, suggesting right atrial pressure of 3 mmHg.   Past Medical History:  Diagnosis Date   Allergy     Arthritis    bilateral hips   Asthma    environmental now resolved   Brain aneurysm    rupture, coiled, in  2003   Breast cancer (HCC) 11/16/2015   Right Breast   Chronic fatigue    Complication of anesthesia    Hard to wake up   Cough    Endometrial cancer (HCC)    Family history of breast cancer    Family history of lung cancer    GERD (gastroesophageal reflux disease)    Heart murmur    years ago, when I was young   Hemangioma    History of hiatal hernia    Hyperlipidemia    IBS (irritable bowel syndrome)    Iron  deficiency anemia    Kyphosis of thoracic region    MVP (mitral valve prolapse)    Osteopenia    Personal history of radiation therapy    SAH (subarachnoid hemorrhage) (HCC)    Vertigo     Past Surgical History:  Procedure Laterality Date   APPENDECTOMY     BREAST BIOPSY Right 11/16/2015   U/S Core- Malignant   BREAST BIOPSY Left 06/21/2005   Stereo- Benign   BREAST EXCISIONAL BIOPSY Left 2007   BREAST LUMPECTOMY Right 12/22/2015   BREAST LUMPECTOMY WITH RADIOACTIVE SEED AND SENTINEL LYMPH NODE BIOPSY Right 12/22/2015   Procedure: BREAST LUMPECTOMY WITH RADIOACTIVE SEED AND SENTINEL LYMPH NODE BIOPSY;  Surgeon: Alm Angle, MD;  Location: [facility];  Service: General;  Laterality: Right;   COLONOSCOPY     DILATATION & CURRETTAGE/HYSTEROSCOPY WITH RESECTOCOPE N/A 05/11/2024   Procedure: DILATATION & CURETTAGE/HYSTEROSCOPY WITH RESECTOCOPE;  Surgeon: Horacio Boas, MD;  Location: [facility] OR;  Service: Gynecology;  Laterality: N/A;   FOOT SURGERY Right    bunionectomy   MOUTH SURGERY     MYOSURE RESECTION N/A 05/11/2024   Procedure: MELINDA RESECTION;  Surgeon: Horacio Boas, MD;  Location: [facility] OR;  Service: Gynecology;  Laterality: N/A;   SAH  2003   s/p embolization per Dr. Dolphus   TONSILLECTOMY     TOTAL HIP ARTHROPLASTY Left 01/11/2017   Procedure: LEFT TOTAL HIP ARTHROPLASTY ANTERIOR APPROACH;  Surgeon: Barbarann Oneil BROCKS, MD;  Location: [facility] OR;  Service: Orthopedics;  Laterality: Left;   TOTAL HIP ARTHROPLASTY Right 02/03/2021   Procedure: RIGHT  TOTAL HIP ARTHROPLASTY ANTERIOR APPROACH;  Surgeon: Barbarann Oneil BROCKS, MD;  Location: [facility] OR;  Service: Orthopedics;  Laterality: Right;   TUBAL LIGATION     UPPER GASTROINTESTINAL ENDOSCOPY      MEDICATIONS:  aspirin  EC 81 MG tablet   B Complex-C (SUPER B COMPLEX/VITAMIN C  PO)   Cholecalciferol  (VITAMIN D3) 50 MCG (2000 UT) TABS   docusate sodium  (COLACE) 100 MG capsule   esomeprazole  (NEXIUM ) 20 MG capsule   famotidine  (PEPCID ) 20 MG tablet   ferrous gluconate  (FERGON) 324 MG tablet   pravastatin  (PRAVACHOL ) 40 MG tablet   senna-docusate (SENOKOT-S) 8.6-50 MG tablet   tamoxifen  (NOLVADEX ) 20 MG tablet   traMADol  (ULTRAM ) 50 MG tablet   No current facility-administered medications for this encounter.       Harlene Hoots Ward, PA-C [facility] Pre-Surgical Testing [phone]

## 2024-05-26 NOTE — Telephone Encounter (Signed)
 Received PCP clearance.

## 2024-05-27 ENCOUNTER — Telehealth: Payer: Self-pay | Admitting: *Deleted

## 2024-05-27 NOTE — Telephone Encounter (Addendum)
 Telephone call to check on pre-operative status.  Patient compliant with pre-operative instructions.  Reinforced nothing to eat after midnight. Clear liquids until 0415. Patient to arrive at 0515.    Patient's husband  called pre-admit and stated his wife woke up with a scratchy throat and mild cough. He spoke with the RN who sent the office a message. Gwendolyn states she will let anesthesia know. After speaking with patient, she no longer has a scratchy throat and doesn't have a cough. Pt also denies fever, chills or pain. Advised patient her message will be relayed to providers.

## 2024-05-27 NOTE — Progress Notes (Addendum)
 Patient's husband phoned stating that patient woke up with a scratchy throat this morning and mild cough.  Patient is scheduled for surgery on 05-28-24.  I advised patient to notify surgeon if symptoms progressed or worsened.  Jaime at Dr. Lewie office made aware

## 2024-05-28 ENCOUNTER — Encounter (HOSPITAL_COMMUNITY): Payer: Self-pay | Admitting: Gynecologic Oncology

## 2024-05-28 ENCOUNTER — Ambulatory Visit (HOSPITAL_COMMUNITY): Admitting: Anesthesiology

## 2024-05-28 ENCOUNTER — Encounter (HOSPITAL_COMMUNITY): Payer: Self-pay | Admitting: Medical

## 2024-05-28 ENCOUNTER — Ambulatory Visit (HOSPITAL_COMMUNITY)
Admission: RE | Admit: 2024-05-28 | Discharge: 2024-05-28 | Disposition: A | Attending: Gynecologic Oncology | Admitting: Gynecologic Oncology

## 2024-05-28 ENCOUNTER — Encounter: Admission: RE | Disposition: A | Payer: Self-pay | Attending: Gynecologic Oncology

## 2024-05-28 DIAGNOSIS — J45909 Unspecified asthma, uncomplicated: Secondary | ICD-10-CM

## 2024-05-28 DIAGNOSIS — N840 Polyp of corpus uteri: Secondary | ICD-10-CM | POA: Diagnosis not present

## 2024-05-28 DIAGNOSIS — E785 Hyperlipidemia, unspecified: Secondary | ICD-10-CM

## 2024-05-28 DIAGNOSIS — C541 Malignant neoplasm of endometrium: Secondary | ICD-10-CM

## 2024-05-28 DIAGNOSIS — D259 Leiomyoma of uterus, unspecified: Secondary | ICD-10-CM | POA: Diagnosis not present

## 2024-05-28 DIAGNOSIS — M1990 Unspecified osteoarthritis, unspecified site: Secondary | ICD-10-CM | POA: Diagnosis not present

## 2024-05-28 DIAGNOSIS — Z79810 Long term (current) use of selective estrogen receptor modulators (SERMs): Secondary | ICD-10-CM | POA: Diagnosis not present

## 2024-05-28 DIAGNOSIS — K219 Gastro-esophageal reflux disease without esophagitis: Secondary | ICD-10-CM | POA: Diagnosis not present

## 2024-05-28 DIAGNOSIS — K660 Peritoneal adhesions (postprocedural) (postinfection): Secondary | ICD-10-CM | POA: Diagnosis not present

## 2024-05-28 DIAGNOSIS — K449 Diaphragmatic hernia without obstruction or gangrene: Secondary | ICD-10-CM | POA: Diagnosis not present

## 2024-05-28 DIAGNOSIS — N888 Other specified noninflammatory disorders of cervix uteri: Secondary | ICD-10-CM | POA: Diagnosis not present

## 2024-05-28 DIAGNOSIS — Z923 Personal history of irradiation: Secondary | ICD-10-CM | POA: Diagnosis not present

## 2024-05-28 DIAGNOSIS — Z853 Personal history of malignant neoplasm of breast: Secondary | ICD-10-CM | POA: Diagnosis not present

## 2024-05-28 DIAGNOSIS — N838 Other noninflammatory disorders of ovary, fallopian tube and broad ligament: Secondary | ICD-10-CM | POA: Diagnosis not present

## 2024-05-28 DIAGNOSIS — Z780 Asymptomatic menopausal state: Secondary | ICD-10-CM | POA: Diagnosis not present

## 2024-05-28 DIAGNOSIS — N8003 Adenomyosis of the uterus: Secondary | ICD-10-CM | POA: Diagnosis not present

## 2024-05-28 HISTORY — PX: LYMPH NODE BIOPSY: SHX201

## 2024-05-28 HISTORY — PX: ROBOTIC ASSISTED TOTAL HYSTERECTOMY WITH BILATERAL SALPINGO OOPHERECTOMY: SHX6086

## 2024-05-28 HISTORY — PX: INJECTION, FOR SENTINEL LYMPH NODE IDENTIFICATION: SHX7598

## 2024-05-28 LAB — TYPE AND SCREEN
ABO/RH(D): A POS
Antibody Screen: NEGATIVE

## 2024-05-28 SURGERY — HYSTERECTOMY, TOTAL, ROBOT-ASSISTED, LAPAROSCOPIC, WITH BILATERAL SALPINGO-OOPHORECTOMY
Anesthesia: General

## 2024-05-28 MED ORDER — LACTATED RINGERS IV SOLN: INTRAVENOUS | Status: DC

## 2024-05-28 MED ORDER — PROPOFOL 10 MG/ML IV BOLUS
INTRAVENOUS | Status: DC | PRN
  Administered 2024-05-28: 80 mg via INTRAVENOUS

## 2024-05-28 MED ORDER — ACETAMINOPHEN 500 MG PO TABS
1000.0000 mg | ORAL_TABLET | ORAL | Status: AC
  Administered 2024-05-28: 1000 mg via ORAL
  Filled 2024-05-28: qty 2

## 2024-05-28 MED ORDER — OXYCODONE HCL 5 MG PO TABS: 5.0000 mg | ORAL_TABLET | Freq: Once | ORAL | Status: DC | PRN

## 2024-05-28 MED ORDER — OXYCODONE HCL 5 MG/5ML PO SOLN: 5.0000 mg | Freq: Once | ORAL | Status: DC | PRN

## 2024-05-28 MED ORDER — STERILE WATER FOR INJECTION IJ SOLN
INTRAMUSCULAR | Status: AC
  Filled 2024-05-28: qty 10

## 2024-05-28 MED ORDER — STERILE WATER FOR INJECTION IJ SOLN
INTRAMUSCULAR | Status: DC | PRN
  Administered 2024-05-28: 4 mL via INTRAMUSCULAR

## 2024-05-28 MED ORDER — LIDOCAINE HCL 2 % IJ SOLN
INTRAMUSCULAR | Status: AC
  Filled 2024-05-28: qty 20

## 2024-05-28 MED ORDER — SUGAMMADEX SODIUM 200 MG/2ML IV SOLN
INTRAVENOUS | Status: AC
  Filled 2024-05-28: qty 2

## 2024-05-28 MED ORDER — ONDANSETRON HCL 4 MG/2ML IJ SOLN
INTRAMUSCULAR | Status: DC | PRN
  Administered 2024-05-28: 4 mg via INTRAVENOUS

## 2024-05-28 MED ORDER — HEPARIN SODIUM (PORCINE) 5000 UNIT/ML IJ SOLN
5000.0000 [IU] | INTRAMUSCULAR | Status: AC
  Administered 2024-05-28: 5000 [IU] via SUBCUTANEOUS
  Filled 2024-05-28: qty 1

## 2024-05-28 MED ORDER — BUPIVACAINE HCL (PF) 0.25 % IJ SOLN
INTRAMUSCULAR | Status: AC
  Filled 2024-05-28: qty 30

## 2024-05-28 MED ORDER — STERILE WATER FOR INJECTION IJ SOLN
INTRAMUSCULAR | Status: DC | PRN
  Administered 2024-05-28: 4 mL

## 2024-05-28 MED ORDER — ROCURONIUM BROMIDE 10 MG/ML (PF) SYRINGE
PREFILLED_SYRINGE | INTRAVENOUS | Status: DC | PRN
  Administered 2024-05-28: 50 mg via INTRAVENOUS

## 2024-05-28 MED ORDER — CHLORHEXIDINE GLUCONATE 0.12 % MT SOLN
15.0000 mL | Freq: Once | OROMUCOSAL | Status: AC
  Administered 2024-05-28: 15 mL via OROMUCOSAL

## 2024-05-28 MED ORDER — LACTATED RINGERS IR SOLN
Status: DC | PRN
  Administered 2024-05-28: 1000 mL

## 2024-05-28 MED ORDER — SUGAMMADEX SODIUM 200 MG/2ML IV SOLN
INTRAVENOUS | Status: DC | PRN
  Administered 2024-05-28: 200 mg via INTRAVENOUS

## 2024-05-28 MED ORDER — FENTANYL CITRATE (PF) 50 MCG/ML IJ SOSY: 25.0000 ug | PREFILLED_SYRINGE | INTRAMUSCULAR | Status: DC | PRN

## 2024-05-28 MED ORDER — ORAL CARE MOUTH RINSE: 15.0000 mL | Freq: Once | OROMUCOSAL | Status: AC

## 2024-05-28 MED ORDER — LIDOCAINE HCL (PF) 2 % IJ SOLN
INTRAMUSCULAR | Status: AC
  Filled 2024-05-28: qty 5

## 2024-05-28 MED ORDER — LIDOCAINE HCL (PF) 2 % IJ SOLN
INTRAMUSCULAR | Status: DC | PRN
  Administered 2024-05-28: 1.5 mg/kg/h via INTRADERMAL
  Administered 2024-05-28: 100 mg via INTRADERMAL

## 2024-05-28 MED ORDER — LACTATED RINGERS IV SOLN: INTRAVENOUS | Status: DC | PRN

## 2024-05-28 MED ORDER — ROCURONIUM BROMIDE 10 MG/ML (PF) SYRINGE
PREFILLED_SYRINGE | INTRAVENOUS | Status: AC
  Filled 2024-05-28: qty 10

## 2024-05-28 MED ORDER — FENTANYL CITRATE (PF) 100 MCG/2ML IJ SOLN
INTRAMUSCULAR | Status: DC | PRN
  Administered 2024-05-28 (×2): 50 ug via INTRAVENOUS

## 2024-05-28 MED ORDER — DEXAMETHASONE SOD PHOSPHATE PF 10 MG/ML IJ SOLN
4.0000 mg | INTRAMUSCULAR | Status: AC
  Administered 2024-05-28: 4 mg via INTRAVENOUS

## 2024-05-28 MED ORDER — BUPIVACAINE HCL 0.25 % IJ SOLN
INTRAMUSCULAR | Status: DC | PRN
  Administered 2024-05-28: 28 mL

## 2024-05-28 MED ORDER — PROPOFOL 10 MG/ML IV BOLUS
INTRAVENOUS | Status: AC
  Filled 2024-05-28: qty 20

## 2024-05-28 MED ORDER — STERILE WATER FOR IRRIGATION IR SOLN
Status: DC | PRN
  Administered 2024-05-28: 1000 mL

## 2024-05-28 MED ORDER — ONDANSETRON HCL 4 MG/2ML IJ SOLN: 4.0000 mg | Freq: Four times a day (QID) | INTRAMUSCULAR | Status: DC | PRN

## 2024-05-28 MED ORDER — CEFAZOLIN SODIUM-DEXTROSE 2-4 GM/100ML-% IV SOLN
2.0000 g | INTRAVENOUS | Status: AC
  Administered 2024-05-28: 2 g via INTRAVENOUS
  Filled 2024-05-28: qty 100

## 2024-05-28 MED ORDER — ONDANSETRON HCL 4 MG/2ML IJ SOLN
INTRAMUSCULAR | Status: AC
  Filled 2024-05-28: qty 2

## 2024-05-28 MED ORDER — FENTANYL CITRATE (PF) 100 MCG/2ML IJ SOLN
INTRAMUSCULAR | Status: AC
  Filled 2024-05-28: qty 2

## 2024-05-28 SURGICAL SUPPLY — 72 items
APPLICATOR SURGIFLO ENDO (HEMOSTASIS) IMPLANT
BAG COUNTER SPONGE SURGICOUNT (BAG) IMPLANT
BAG LAPAROSCOPIC 12 15 PORT 16 (BASKET) IMPLANT
BLADE SURG SZ10 CARB STEEL (BLADE) IMPLANT
COVER BACK TABLE 60X90IN (DRAPES) ×2 IMPLANT
COVER TIP SHEARS 8 DVNC (MISCELLANEOUS) ×2 IMPLANT
DERMABOND ADVANCED .7 DNX12 (GAUZE/BANDAGES/DRESSINGS) ×2 IMPLANT
DRAPE ARM DVNC X/XI (DISPOSABLE) ×8 IMPLANT
DRAPE COLUMN DVNC XI (DISPOSABLE) ×2 IMPLANT
DRAPE SHEET LG 3/4 BI-LAMINATE (DRAPES) ×2 IMPLANT
DRAPE SURG IRRIG POUCH 19X23 (DRAPES) ×2 IMPLANT
DRIVER NDL MEGA SUTCUT DVNCXI (INSTRUMENTS) ×2 IMPLANT
DRIVER NDLE MEGA SUTCUT DVNCXI (INSTRUMENTS) ×2 IMPLANT
DRSG OPSITE POSTOP 4X6 (GAUZE/BANDAGES/DRESSINGS) IMPLANT
DRSG OPSITE POSTOP 4X8 (GAUZE/BANDAGES/DRESSINGS) IMPLANT
ELECT PENCIL ROCKER SW 15FT (MISCELLANEOUS) IMPLANT
ELECT REM PT RETURN 15FT ADLT (MISCELLANEOUS) ×2 IMPLANT
FORCEPS BPLR FENES DVNC XI (FORCEP) ×2 IMPLANT
FORCEPS PROGRASP DVNC XI (FORCEP) ×2 IMPLANT
GAUZE 4X4 16PLY ~~LOC~~+RFID DBL (SPONGE) ×4 IMPLANT
GLOVE BIO SURGEON STRL SZ 6 (GLOVE) ×8 IMPLANT
GLOVE BIO SURGEON STRL SZ 6.5 (GLOVE) ×2 IMPLANT
GOWN STRL REUS W/ TWL LRG LVL3 (GOWN DISPOSABLE) ×8 IMPLANT
GRASPER SUT TROCAR 14GX15 (MISCELLANEOUS) IMPLANT
HOLDER FOLEY CATH W/STRAP (MISCELLANEOUS) IMPLANT
IRRIGATION SUCT STRKRFLW 2 WTP (MISCELLANEOUS) ×2 IMPLANT
KIT PROCEDURE DVNC SI (MISCELLANEOUS) IMPLANT
KIT TURNOVER KIT A (KITS) ×2 IMPLANT
LIGASURE IMPACT 36 18CM CVD LR (INSTRUMENTS) IMPLANT
MANIPULATOR ADVINCU DEL 3.0 PL (MISCELLANEOUS) IMPLANT
MANIPULATOR ADVINCU DEL 3.5 PL (MISCELLANEOUS) IMPLANT
MANIPULATOR UTERINE 4.5 ZUMI (MISCELLANEOUS) IMPLANT
NDL HYPO 21X1.5 SAFETY (NEEDLE) ×2 IMPLANT
NDL SPNL 18GX3.5 QUINCKE PK (NEEDLE) IMPLANT
NEEDLE HYPO 21X1.5 SAFETY (NEEDLE) ×2 IMPLANT
NEEDLE SPNL 18GX3.5 QUINCKE PK (NEEDLE) IMPLANT
OBTURATOR OPTICAL LONG 8 DVNC (TROCAR) IMPLANT
OBTURATOR OPTICALSTD 8 DVNC (TROCAR) ×2 IMPLANT
PACK ROBOT GYN CUSTOM WL (TRAY / TRAY PROCEDURE) ×2 IMPLANT
PAD POSITIONING PINK XL (MISCELLANEOUS) ×2 IMPLANT
PORT ACCESS TROCAR AIRSEAL 12 (TROCAR) IMPLANT
SCISSORS LAP 5X45 EPIX DISP (ENDOMECHANICALS) IMPLANT
SCISSORS MNPLR CVD DVNC XI (INSTRUMENTS) ×2 IMPLANT
SCRUB CHG 4% DYNA-HEX 4OZ (MISCELLANEOUS) IMPLANT
SEAL UNIV 5-12 XI (MISCELLANEOUS) ×8 IMPLANT
SET TRI-LUMEN FLTR TB AIRSEAL (TUBING) ×2 IMPLANT
SPIKE FLUID TRANSFER (MISCELLANEOUS) ×2 IMPLANT
SPONGE T-LAP 18X18 ~~LOC~~+RFID (SPONGE) IMPLANT
SURGIFLO W/THROMBIN 8M KIT (HEMOSTASIS) IMPLANT
SUT MNCRL AB 4-0 PS2 18 (SUTURE) IMPLANT
SUT PDS AB 1 TP1 96 (SUTURE) IMPLANT
SUT STRATA PDS 0 30 CT-2.5 (SUTURE) IMPLANT
SUT STRATAFIX PDS+0 CT1 9 (SUTURE) IMPLANT
SUT V-LOC 180 0-0 GS22 (SUTURE) IMPLANT
SUT VIC AB 0 CT1 27XBRD ANTBC (SUTURE) IMPLANT
SUT VIC AB 2-0 CT1 TAPERPNT 27 (SUTURE) IMPLANT
SUT VIC AB 2-0 SH 27X BRD (SUTURE) IMPLANT
SUT VIC AB 4-0 PS2 18 (SUTURE) ×4 IMPLANT
SUT VICRYL 0 27 CT2 27 ABS (SUTURE) ×2 IMPLANT
SUT VLOC 180 0 9IN GS21 (SUTURE) IMPLANT
SUTURE STRATFX 0 PDS+ CT-2 23 (SUTURE) IMPLANT
SYR 10ML LL (SYRINGE) IMPLANT
SYSTEM BAG RETRIEVAL 10MM (BASKET) IMPLANT
SYSTEM RETRIEVL 5MM INZII UNIV (BASKET) IMPLANT
SYSTEM WOUND ALEXIS 18CM MED (MISCELLANEOUS) IMPLANT
TRAP SPECIMEN MUCUS 40CC (MISCELLANEOUS) IMPLANT
TRAY FOLEY MTR SLVR 16FR STAT (SET/KITS/TRAYS/PACK) ×2 IMPLANT
TROCAR PORT AIRSEAL 5X120 (TROCAR) IMPLANT
TROCAR XCEL NON-BLD 5MMX100MML (ENDOMECHANICALS) IMPLANT
UNDERPAD 30X36 HEAVY ABSORB (UNDERPADS AND DIAPERS) ×4 IMPLANT
WATER STERILE IRR 1000ML POUR (IV SOLUTION) ×2 IMPLANT
YANKAUER SUCT BULB TIP 10FT TU (MISCELLANEOUS) IMPLANT

## 2024-05-28 NOTE — Discharge Instructions (Addendum)
 AFTER SURGERY INSTRUCTIONS   Return to work: 4-6 weeks if applicable   Activity: 1. Be up and out of the bed during the day.  Take a nap if needed.  You may walk up steps but be careful and use the hand rail.  Stair climbing will tire you more than you think, you may need to stop part way and rest.    2. No lifting or straining for 6 weeks over 10 pounds. No pushing, pulling, straining for 6 weeks.   3. No driving for 1-61 days when the following criteria have been met: Do not drive if you are taking narcotic pain medicine and make sure that your reaction time has returned.    4. You can shower as soon as the next day after surgery. Shower daily.  Use your regular soap and water  (not directly on the incision) and pat your incision(s) dry afterwards; don't rub.  No tub baths or submerging your body in water  until cleared by your surgeon. If you have the soap that was given to you by pre-surgical testing that was used before surgery, you do not need to use it afterwards because this can irritate your incisions.    5. No sexual activity and nothing in the vagina for 12 weeks.   6. You may experience a small amount of clear drainage from your incisions, which is normal.  If the drainage persists, increases, or changes color please call the office.   7. Do not use creams, lotions, or ointments such as neosporin on your incisions after surgery until advised by your surgeon because they can cause removal of the dermabond glue on your incisions.     8. You may experience vaginal spotting after surgery or when the stitches at the top of the vagina begin to dissolve.  The spotting is normal but if you experience heavy bleeding, call our office.   9. Take Tylenol or ibuprofen first for pain if you are able to take these medications and only use narcotic pain medication for severe pain not relieved by the Tylenol or Ibuprofen.  Monitor your Tylenol intake to a max of 4,000 mg in a 24 hour period. You can  alternate these medications after surgery.   Diet: 1. Low sodium Heart Healthy Diet is recommended but you are cleared to resume your normal (before surgery) diet after your procedure.   2. It is safe to use a laxative, such as Miralax or Colace, if you have difficulty moving your bowels before surgery. You have been prescribed Sennakot-S to take at bedtime every evening after surgery to keep bowel movements regular and to prevent constipation.     Wound Care: 1. Keep clean and dry.  Shower daily.   Reasons to call the Doctor: Fever - Oral temperature greater than 100.4 degrees Fahrenheit Foul-smelling vaginal discharge Difficulty urinating Nausea and vomiting Increased pain at the site of the incision that is unrelieved with pain medicine. Difficulty breathing with or without chest pain New calf pain especially if only on one side Sudden, continuing increased vaginal bleeding with or without clots.   Contacts: For questions or concerns you should contact:   Dr. Wiley Hanger at [phone]   Vira Grieves, NP at [phone]   After Hours: call [phone] and have the GYN Oncologist paged/contacted (after 5 pm or on the weekends). You will speak with an after hours RN and let he or she know you have had surgery.   Messages sent via mychart are for non-urgent  matters and are not responded to after hours so for urgent needs, please call the after hours number.

## 2024-05-28 NOTE — Op Note (Signed)
 OPERATIVE NOTE  Pre-operative Diagnosis: endometrial cancer grade 1  Post-operative Diagnosis: same  Operation: Robotic-assisted laparoscopic total hysterectomy with bilateral salpingoophorectomy, SLN biopsy bilaterally  Surgeon: Viktoria Crank MD  Assistant Surgeon: Eleanor Epps, NP (an NP assistant was necessary for tissue manipulation, management of robotic instrumentation, retraction and positioning due to the complexity of the case and hospital policies).   Anesthesia: GET  Urine Output: 100 cc  Operative Findings: On EUA, small mobile uterus. On intra-abdominal entry, normal upper abdominal survey. Normal small and large bowel. Some filmy adhesions of the cecum to the right lateral sidewall. Uterus 8 am and somewhat bulbous at the fundus. Normal appearing bilateral tubes and ovaries. Mapping successful to bilateral sentinel lymph nodes, no obvious adenopathy.   Estimated Blood Loss:  25 cc      Total IV Fluids: see I&O flowsheet         Specimens: uterus, cervix, bilateral tubes and ovaries, bilateral obturator SLNs         Complications:  None apparent; patient tolerated the procedure well.         Disposition: PACU - hemodynamically stable.  Procedure Details  The patient was seen in the Holding Room. The risks, benefits, complications, treatment options, and expected outcomes were discussed with the patient.  The patient concurred with the proposed plan, giving informed consent.  The site of surgery properly noted/marked. The patient was identified as Alexis [name] and the procedure verified as a Robotic-assisted hysterectomy with bilateral salpingo oophorectomy with SLN biopsy.   After induction of anesthesia, the patient was draped and prepped in the usual sterile manner. Patient was placed in supine position after anesthesia and draped and prepped in the usual sterile manner as follows: Her arms were tucked to her side with all appropriate precautions.  The patient was  secured to the bed using padding and tape across her chest.  The patient was placed in the semi-lithotomy position in [place]wood stirrups.  The perineum and vagina were prepped with CHG. The patient's abdomen was prepped with ChloraPrep and she was draped after the prep had been allowed to dry for 3 minutes.  A Time Out was held and the above information confirmed.  The urethra was prepped with Betadine. Foley catheter was placed.  A sterile speculum was placed in the vagina.  The cervix was grasped with a single-tooth tenaculum. 2mg  total of ICG was injected into the cervical stroma at 2 and 9 o'clock with 1cc injected at a 1cm and 2mm depth (concentration 0.5mg /ml) in all locations. The cervix was dilated with Fredirick dilators.  The ZUMI uterine manipulator with a small colpotomizer ring was placed without difficulty.  A pneum occluder balloon was placed over the manipulator.  OG tube placement was confirmed and to suction.   Next, a 10 mm skin incision was made 1 cm below the subcostal margin in the midclavicular line.  The 5 mm Optiview port and scope was used for direct entry.  Opening pressure was under 10 mm CO2.  The abdomen was insufflated and the findings were noted as above.   At this point and all points during the procedure, the patient's intra-abdominal pressure did not exceed 15 mmHg. Next, an 8 mm skin incision was made superior to the umbilicus and a right and left port were placed about 8 cm lateral to the robot port on the right and left side.  The 5 mm assist trocar was exchanged for a 12 mm airseal port. All ports were placed under  direct visualization.  The patient was placed in steep Trendelenburg.  Bowel was folded away into the upper abdomen.  The robot was docked in the normal manner.  The right and left peritoneum were opened parallel to the IP ligament to open the retroperitoneal spaces bilaterally. The round ligaments were transected. The SLN mapping was performed in bilateral pelvic  basins. After identifying the ureters, the para rectal and paravesical spaces were opened up entirely with careful dissection below the level of the ureters bilaterally and to the depth of the uterine artery origin in order to skeletonize the uterine web and ensure visualization of all parametrial channels. The para-aortic basins were carefully exposed and evaluated for isolated para-aortic SLN's. Lymphatic channels were identified travelling to the following visualized sentinel lymph node's: bilateral obturator SLNs. These SLN's were separated from their surrounding lymphatic tissue, removed and sent for permanent pathology.  The hysterectomy was started.  The ureter was again noted to be on the medial leaf of the broad ligament.  The peritoneum above the ureter was incised and stretched and the infundibulopelvic ligament was skeletonized, cauterized and cut.  The posterior peritoneum was taken down to the level of the KOH ring.  The anterior peritoneum was also taken down.  The bladder flap was created to the level of the KOH ring.  The uterine artery on the right side was skeletonized, cauterized and cut in the normal manner.  A similar procedure was performed on the left.  The colpotomy was made and the uterus, cervix, bilateral ovaries and tubes were amputated and delivered through the vagina.  Pedicles were inspected and excellent hemostasis was achieved.    The colpotomy at the vaginal cuff was closed with 0 Vicryl with a  figure of eight stitch at each apex and 0 V-Loc to close the midportion of the cuff in two layers in a running manner.  Irrigation was used and excellent hemostasis was achieved.  At this point in the procedure was completed.  Robotic instruments were removed under direct visulaization.  The robot was undocked. The fascia at the 12 mm port was closed with 0 Vicryl using a PMI fascial closure device under direct visualization.  The subcuticular tissue was closed with 4-0 Vicryl and the  skin was closed with 4-0 Monocryl in a subcuticular manner.  Dermabond was applied.    The vagina was swabbed with minimal bleeding noted. Tear along the posterior perineum and distal vagina was repaired using running locked 2-0 Vicryl Foley catheter was removed  All sponge, lap and needle counts were correct x  3.   The patient was transferred to the recovery room in stable condition.  Comer Dollar, MD

## 2024-05-28 NOTE — Anesthesia Procedure Notes (Addendum)
 Procedure Name: Intubation Date/Time: 05/28/2024 7:31 AM  Performed by: Carleton Garnette SAUNDERS, CRNAPre-anesthesia Checklist: Patient identified, Emergency Drugs available, Suction available, Patient being monitored and Timeout performed Patient Re-evaluated:Patient Re-evaluated prior to induction Oxygen Delivery Method: Circle system utilized Preoxygenation: Pre-oxygenation with 100% oxygen Induction Type: IV induction Ventilation: Mask ventilation without difficulty Laryngoscope Size: Mac and 3 Grade View: Grade I Tube type: Oral Tube size: 7.0 mm Number of attempts: 1 Airway Equipment and Method: Stylet Placement Confirmation: ETT inserted through vocal cords under direct vision, positive ETCO2 and breath sounds checked- equal and bilateral Secured at: 21 cm Dental Injury: Teeth and Oropharynx as per pre-operative assessment

## 2024-05-28 NOTE — Interval H&P Note (Signed)
 History and Physical Interval Note:  05/28/2024 6:25 AM  Rock FORBES Ada  has presented today for surgery, with the diagnosis of ENDOMETRIAL CANCER.  The various methods of treatment have been discussed with the patient and family. After consideration of risks, benefits and other options for treatment, the patient has consented to  Procedures with comments: HYSTERECTOMY, TOTAL, ROBOT-ASSISTED, LAPAROSCOPIC, WITH BILATERAL SALPINGO-OOPHORECTOMY (Bilateral) - POSSIBLE LAPAROTOMY INJECTION, FOR SENTINEL LYMPH NODE IDENTIFICATION (N/A) LYMPH NODE BIOPSY (N/A) LYMPHADENECTOMY, PELVIS, ROBOT-ASSISTED (N/A) as a surgical intervention.  The patient's history has been reviewed, patient examined, no change in status, stable for surgery.  I have reviewed the patient's chart and labs.  Questions were answered to the patient's satisfaction.     Comer JONELLE Dollar

## 2024-05-28 NOTE — Transfer of Care (Signed)
 Immediate Anesthesia Transfer of Care Note  Patient: Alexis [name]  Procedure(s) Performed: HYSTERECTOMY, TOTAL, ROBOT-ASSISTED, LAPAROSCOPIC, WITH BILATERAL SALPINGO-OOPHORECTOMY (Bilateral) INJECTION, FOR SENTINEL LYMPH NODE IDENTIFICATION LYMPH NODE BIOPSY  Patient Location: PACU  Anesthesia Type:General  Level of Consciousness: sedated  Airway & Oxygen Therapy: Patient Spontanous Breathing and Patient connected to face mask oxygen  Post-op Assessment: Report given to RN and Post -op Vital signs reviewed and stable  Post vital signs: Reviewed and stable  Last Vitals:  Vitals Value Taken Time  BP 118/68 05/28/24 09:28  Temp    Pulse 60 05/28/24 09:29  Resp 15 05/28/24 09:29  SpO2 100 % 05/28/24 09:29  Vitals shown include unfiled device data.  Last Pain:  Vitals:   05/28/24 0537  TempSrc: Oral  PainSc: 0-No pain         Complications: No notable events documented.

## 2024-05-28 NOTE — Anesthesia Preprocedure Evaluation (Signed)
 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  Coiled.    GI/Hepatic hiatal hernia,GERD  ,,  Endo/Other    Renal/GU      Musculoskeletal  (+) Arthritis ,    Abdominal   Peds  Hematology   Anesthesia Other Findings   Reproductive/Obstetrics                              Anesthesia Physical Anesthesia Plan  ASA: 3  Anesthesia Plan: General   Post-op Pain Management:    Induction: Intravenous  PONV Risk Score and Plan: 3 and Ondansetron , Dexamethasone  and Treatment may vary due to age or medical condition  Airway Management Planned: Oral ETT  Additional Equipment:   Intra-op Plan:   Post-operative Plan: Extubation in OR  Informed Consent: I have reviewed the patients History and Physical, chart, labs and discussed the procedure including the risks, benefits and alternatives for the proposed anesthesia with the patient or authorized representative who has indicated his/her understanding and acceptance.     Dental advisory given  Plan Discussed with: CRNA, Anesthesiologist and Surgeon  Anesthesia Plan Comments:         Anesthesia Quick Evaluation

## 2024-05-29 ENCOUNTER — Telehealth: Payer: Self-pay | Admitting: *Deleted

## 2024-05-29 ENCOUNTER — Encounter (HOSPITAL_COMMUNITY): Payer: Self-pay | Admitting: Gynecologic Oncology

## 2024-05-29 NOTE — Telephone Encounter (Signed)
 Spoke with Alexis [name] this morning. She states she is eating, drinking and urinating well. She has had a BM and is passing gas. She is taking senokot as prescribed and encouraged her to drink plenty of water. She denies fever or chills. Incisions are dry and intact. She rates her pain 0/10. Her pain is controlled with tylenol  and ibuprofen.    Instructed to call office with any fever, chills, purulent drainage, uncontrolled pain or any other questions or concerns. Patient verbalizes understanding.   Pt aware of post op appointments as well as the office number [phone] and after hours number [phone] to call if she has any questions or concerns

## 2024-05-29 NOTE — Anesthesia Postprocedure Evaluation (Signed)
 Anesthesia Post Note  Patient: Alexis [name]  Procedure(s) Performed: HYSTERECTOMY, TOTAL, ROBOT-ASSISTED, LAPAROSCOPIC, WITH BILATERAL SALPINGO-OOPHORECTOMY (Bilateral) INJECTION, FOR SENTINEL LYMPH NODE IDENTIFICATION LYMPH NODE BIOPSY     Patient location during evaluation: PACU Anesthesia Type: General Level of consciousness: awake and alert Pain management: pain level controlled Vital Signs Assessment: post-procedure vital signs reviewed and stable Respiratory status: spontaneous breathing, nonlabored ventilation, respiratory function stable and patient connected to nasal cannula oxygen Cardiovascular status: blood pressure returned to baseline and stable Postop Assessment: no apparent nausea or vomiting Anesthetic complications: no   No notable events documented.  Last Vitals:  Vitals:   05/28/24 1030 05/28/24 1045  BP: (!) 143/70 (!) 149/61  Pulse: 64 (!) 56  Resp: 15 15  Temp: (!) 36.3 C (!) 36.3 C  SpO2: 94% 93%    Last Pain:  Vitals:   05/28/24 1030  TempSrc:   PainSc: 0-No pain                 Sedale Jenifer S

## 2024-05-31 ENCOUNTER — Ambulatory Visit
Admission: EM | Admit: 2024-05-31 | Discharge: 2024-05-31 | Disposition: A | Discharge status: Home / Self Care | Attending: Family Medicine | Admitting: Family Medicine

## 2024-05-31 ENCOUNTER — Ambulatory Visit

## 2024-05-31 DIAGNOSIS — J189 Pneumonia, unspecified organism: Secondary | ICD-10-CM

## 2024-05-31 DIAGNOSIS — J4521 Mild intermittent asthma with (acute) exacerbation: Secondary | ICD-10-CM | POA: Diagnosis not present

## 2024-05-31 DIAGNOSIS — R051 Acute cough: Secondary | ICD-10-CM

## 2024-05-31 MED ORDER — ALBUTEROL SULFATE (2.5 MG/3ML) 0.083% IN NEBU
2.5000 mg | INHALATION_SOLUTION | Freq: Once | RESPIRATORY_TRACT | Status: AC
  Administered 2024-05-31: 2.5 mg via RESPIRATORY_TRACT

## 2024-05-31 MED ORDER — DOXYCYCLINE HYCLATE 100 MG PO CAPS: 100.0000 mg | ORAL_CAPSULE | Freq: Two times a day (BID) | ORAL | 0 refills | Status: DC

## 2024-05-31 MED ORDER — PROMETHAZINE-DM 6.25-15 MG/5ML PO SYRP: 5.0000 mL | ORAL_SOLUTION | Freq: Four times a day (QID) | ORAL | 0 refills | Status: DC | PRN

## 2024-05-31 MED ORDER — GUAIFENESIN ER 600 MG PO TB12: 600.0000 mg | ORAL_TABLET | Freq: Two times a day (BID) | ORAL | 0 refills | Status: AC | PRN

## 2024-05-31 NOTE — Discharge Instructions (Signed)
 Your x-ray today shows right middle and upper lobe pneumonia.  I have started you on some broad-spectrum antibiotics, Mucinex  and a cough syrup.  Make sure to focus on taking deep breaths, cough up is much as you can and drink plenty of fluids.  Go to the emergency department if worsening at any time.

## 2024-05-31 NOTE — ED Triage Notes (Signed)
 Patient here today with c/o cough since Saturday morning. Cough is worse at night and wakes her up out of her sleep. Patient has taken Coricidin with no relief. Patient has also tried some home remedies with no relief. No known sick contacts. Patient had a Hysterectomy last Thursday.

## 2024-05-31 NOTE — ED Provider Notes (Signed)
 [facility]    CSN: [id number] Arrival date & time: 05/31/24  1024      History   Chief Complaint Chief Complaint  Patient presents with   Cough    HPI Alexis [name] is a 73 y.o. female.   Patient presenting today complaining of a hacking persistent cough that started Saturday morning.  She states she coughs all night particular when laying down, was only able to get some sleep overnight when she sat straight up.  Has tried Coricidin HBP and other home remedies with no relief.  Denies fever, chills, sore throat, congestion, abdominal pain, vomiting, diarrhea.  History of asthma not currently on any inhalers.    Past Medical History:  Diagnosis Date   Allergy     Arthritis    bilateral hips   Asthma    environmental now resolved   Brain aneurysm    rupture, coiled, in 2003   Breast cancer (HCC) 11/16/2015   Right Breast   Chronic fatigue    Complication of anesthesia    Hard to wake up   Cough    Endometrial cancer (HCC)    Family history of breast cancer    Family history of lung cancer    GERD (gastroesophageal reflux disease)    Heart murmur    years ago, when I was young   Hemangioma    History of hiatal hernia    Hyperlipidemia    IBS (irritable bowel syndrome)    Iron  deficiency anemia    Kyphosis of thoracic region    MVP (mitral valve prolapse)    Osteopenia    Personal history of radiation therapy    SAH (subarachnoid hemorrhage) (HCC)    Vertigo     Patient Active Problem List   Diagnosis Date Noted   Endometrial cancer (HCC) 05/21/2024   HLD (hyperlipidemia) 08/16/2021   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/09/2021   Genetic testing 02/09/2019   Family history of breast cancer    Family history of lung cancer    Upper airway cough syndrome 04/30/2017   Palpitations 04/30/2017   Chronic bilateral low back pain 02/28/2017   Malignant neoplasm of upper-outer quadrant of right breast in female, estrogen receptor positive (HCC)  11/17/2015   Microcytic anemia 04/22/2015   Premature atrial contractions 06/24/2009   HEMANGIOMA 05/02/2007   SAH  h/o 2003 05/02/2007   GERD 05/02/2007   IRRITABLE BOWEL SYNDROME 05/02/2007   KYPHOSCOLIOSIS, 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    Past Surgical History:  Procedure Laterality Date   APPENDECTOMY     BREAST BIOPSY Right 11/16/2015   U/S Core- Malignant   BREAST BIOPSY Left 06/21/2005   Stereo- Benign   BREAST EXCISIONAL BIOPSY Left 2007   BREAST LUMPECTOMY Right 12/22/2015   BREAST LUMPECTOMY WITH RADIOACTIVE SEED AND SENTINEL LYMPH NODE BIOPSY Right 12/22/2015   Procedure: BREAST LUMPECTOMY WITH RADIOACTIVE SEED AND SENTINEL LYMPH NODE BIOPSY;  Surgeon: Alm Angle, MD;  Location: [facility];  Service: General;  Laterality: Right;   COLONOSCOPY     DILATATION & CURRETTAGE/HYSTEROSCOPY WITH RESECTOCOPE N/A 05/11/2024   Procedure: DILATATION & CURETTAGE/HYSTEROSCOPY WITH RESECTOCOPE;  Surgeon: Horacio Boas, MD;  Location: [facility];  Service: Gynecology;  Laterality: N/A;   FOOT SURGERY Right    bunionectomy   INJECTION, FOR SENTINEL LYMPH NODE IDENTIFICATION N/A 05/28/2024   Procedure: INJECTION, FOR SENTINEL LYMPH NODE IDENTIFICATION;  Surgeon: Viktoria Comer SAUNDERS, MD;  Location: [facility];  Service: Gynecology;  Laterality: N/A;   LYMPH NODE  BIOPSY N/A 05/28/2024   Procedure: LYMPH NODE BIOPSY;  Surgeon: Viktoria Comer SAUNDERS, MD;  Location: [facility];  Service: Gynecology;  Laterality: N/A;   MOUTH SURGERY     MYOSURE RESECTION N/A 05/11/2024   Procedure: MELINDA RESECTION;  Surgeon: Horacio Boas, MD;  Location: [facility];  Service: Gynecology;  Laterality: N/A;   ROBOTIC ASSISTED TOTAL HYSTERECTOMY WITH BILATERAL SALPINGO OOPHERECTOMY Bilateral 05/28/2024   Procedure: HYSTERECTOMY, TOTAL, ROBOT-ASSISTED, LAPAROSCOPIC, WITH BILATERAL SALPINGO-OOPHORECTOMY;  Surgeon: Viktoria Comer SAUNDERS, MD;  Location: [facility];  Service: Gynecology;  Laterality: Bilateral;  POSSIBLE  LAPAROTOMY   SAH  2003   s/p embolization per Dr. Dolphus   TONSILLECTOMY     TOTAL HIP ARTHROPLASTY Left 01/11/2017   Procedure: LEFT TOTAL HIP ARTHROPLASTY ANTERIOR APPROACH;  Surgeon: Barbarann Oneil BROCKS, MD;  Location: [facility];  Service: Orthopedics;  Laterality: Left;   TOTAL HIP ARTHROPLASTY Right 02/03/2021   Procedure: RIGHT TOTAL HIP ARTHROPLASTY ANTERIOR APPROACH;  Surgeon: Barbarann Oneil BROCKS, MD;  Location: [facility];  Service: Orthopedics;  Laterality: Right;   TUBAL LIGATION     UPPER GASTROINTESTINAL ENDOSCOPY      OB History     Gravida  2   Para  2   Term      Preterm      AB      Living         SAB      IAB      Ectopic      Multiple      Live Births               Home Medications    Prior to Admission medications  Medication Sig Start Date End Date Taking? Authorizing Provider  doxycycline  (VIBRAMYCIN ) 100 MG capsule Take 1 capsule (100 mg total) by mouth 2 (two) times daily. 05/31/24  Yes Stuart Vernell Norris, PA-C  guaiFENesin  (MUCINEX ) 600 MG 12 hr tablet Take 1 tablet (600 mg total) by mouth 2 (two) times daily as needed. 05/31/24  Yes Stuart Vernell Norris, PA-C  promethazine -dextromethorphan (PROMETHAZINE -DM) 6.25-15 MG/5ML syrup Take 5 mLs by mouth 4 (four) times daily as needed. 05/31/24  Yes Stuart Vernell Norris, PA-C  [Paused] aspirin  EC 81 MG tablet Take 81 mg by mouth daily. Swallow whole. Wait to take this until: June 01, 2024    [provider]  B Complex-C (SUPER B COMPLEX/VITAMIN C  PO) Take 1 tablet by mouth every evening.    [provider]  Cholecalciferol  (VITAMIN D3) 50 MCG (2000 UT) TABS Take 2,000 Units by mouth in the morning.    [provider]  docusate sodium  (COLACE) 100 MG capsule Take 100 mg by mouth daily at 12 noon.    [provider]  esomeprazole  (NEXIUM ) 20 MG capsule Take 20 mg by mouth 2 (two) times daily.    [provider]  famotidine  (PEPCID ) 20 MG tablet Take 20 mg  by mouth 2 (two) times daily. 05/13/24   [provider]  ferrous gluconate  (FERGON) 324 MG tablet Take 1 tablet (324 mg total) by mouth every other day. 04/24/24   Geofm Delon BRAVO, NP  pravastatin  (PRAVACHOL ) 40 MG tablet TAKE 1 TABLET BY MOUTH EVERY DAY IN THE EVENING 07/31/22   Darlean Ozell NOVAK, MD  senna-docusate (SENOKOT-S) 8.6-50 MG tablet Take 2 tablets by mouth at bedtime. For AFTER surgery, do not take if having diarrhea 05/21/24   Cross, Eleanor D, NP  tamoxifen  (NOLVADEX ) 20 MG tablet TAKE 1 TABLET  BY MOUTH EVERYDAY AT BEDTIME 05/08/24   Geofm Delon BRAVO, NP    Family History Family History  Problem Relation Age of Onset   Hyperlipidemia Mother    Breast cancer Mother 36   Heart attack Father    Diabetes Brother    Breast cancer Maternal Aunt        diagnosed 43s or 57s   Breast cancer Paternal Aunt 23       diagnosed in her 63s   Lung cancer Paternal Uncle    Colon cancer Neg Hx    Colon polyps Neg Hx    Esophageal cancer Neg Hx    Rectal cancer Neg Hx    Stomach cancer Neg Hx     Social History Social History[1]   Allergies   Latex   Review of Systems Review of Systems Per HPI  Physical Exam Triage Vital Signs ED Triage Vitals  Encounter Vitals Group     BP 05/31/24 1038 130/79     Girls Systolic BP Percentile --      Girls Diastolic BP Percentile --      Boys Systolic BP Percentile --      Boys Diastolic BP Percentile --      Pulse Rate 05/31/24 1038 67     Resp 05/31/24 1038 16     Temp 05/31/24 1038 98 F (36.7 C)     Temp Source 05/31/24 1038 Oral     SpO2 05/31/24 1038 92 %     Weight --      Height --      Head Circumference --      Peak Flow --      Pain Score 05/31/24 1033 0     Pain Loc --      Pain Education --      Exclude from Growth Chart --    No data found.  Updated Vital Signs BP 130/79 (BP Location: Right Arm)   Pulse 67   Temp 98 F (36.7 C) (Oral)   Resp 18   SpO2 92% Comment: 02 saturation 91-92%. Pt denies  feeling any improvement.  Visual Acuity Right Eye Distance:   Left Eye Distance:   Bilateral Distance:    Right Eye Near:   Left Eye Near:    Bilateral Near:     Physical Exam Vitals and nursing note reviewed.  Constitutional:      Appearance: Normal appearance. She is not ill-appearing.  HENT:     Head: Atraumatic.     Nose: Nose normal.     Mouth/Throat:     Mouth: Mucous membranes are moist.     Pharynx: Oropharynx is clear.  Eyes:     Extraocular Movements: Extraocular movements intact.     Conjunctiva/sclera: Conjunctivae normal.  Cardiovascular:     Rate and Rhythm: Normal rate and regular rhythm.     Heart sounds: Normal heart sounds.  Pulmonary:     Effort: Pulmonary effort is normal. No respiratory distress.     Breath sounds: Normal breath sounds. No wheezing.  Musculoskeletal:        General: Normal range of motion.     Cervical back: Normal range of motion and neck supple.  Skin:    General: Skin is warm and dry.  Neurological:     Mental Status: She is alert and oriented to person, place, and time.  Psychiatric:        Mood and Affect: Mood normal.        Thought  Content: Thought content normal.        Judgment: Judgment normal.      UC Treatments / Results  Labs (all labs ordered are listed, but only abnormal results are displayed) Labs Reviewed - No data to display  EKG   Radiology DG Chest 2 View Result Date: 05/31/2024 CLINICAL DATA:  4 day history of worsening cough. EXAM: CHEST - 2 VIEW COMPARISON:  01/01/2018 FINDINGS: Interval developed of patchy airspace disease in the right mid and upper lung in the long interval since previous chest x-ray. Lungs are hyperexpanded suggesting emphysema. No evidence for pneumothorax or pleural effusion. Intraperitoneal free air is identified under each hemidiaphragm in this patient postoperative day 3 from robotic associated laparoscopic total hysterectomy and bilateral salpingo oophorectomy on 05/28/2024.  Gas is seen in the sidewall of the lower chest and upper abdomen bilaterally, also compatible with recent surgery. IMPRESSION: 1. Patchy airspace disease in the right mid and upper lung, suspicious for pneumonia. Follow-up imaging recommended to ensure resolution. 2. Intraperitoneal free gas under each hemidiaphragm and gas in the sidewall of the lower chest and upper abdomen bilaterally, compatible with recent surgery. Electronically Signed   By: Camellia Candle M.D.   On: 05/31/2024 11:58    Procedures Procedures (including critical care time)  Medications Ordered in UC Medications  albuterol  (PROVENTIL ) (2.5 MG/3ML) 0.083% nebulizer solution 2.5 mg (2.5 mg Nebulization Given 05/31/24 1135)    Initial Impression / Assessment and Plan / UC Course  I have reviewed the triage vital signs and the nursing notes.  Pertinent labs & imaging results that were available during my care of the patient were reviewed by me and considered in my medical decision making (see chart for details).     Oxygen saturation around 91 to 92% on room air prior to albuterol  nebulizer treatment, 92 to 94% on room air after albuterol  nebulizer treatment.  Otherwise vital signs within normal limits.  Chest x-ray today showing right middle and upper lobe pneumonia.  Will treat with doxycycline , Mucinex , Phenergan  DM, supportive over-the-counter medications and home care.  Strict ED precautions given for worsening symptoms.  Final Clinical Impressions(s) / UC Diagnoses   Final diagnoses:  Acute cough  Pneumonia of right middle lobe due to infectious organism  Mild intermittent asthma with acute exacerbation     Discharge Instructions      Your x-ray today shows right middle and upper lobe pneumonia.  I have started you on some broad-spectrum antibiotics, Mucinex  and a cough syrup.  Make sure to focus on taking deep breaths, cough up is much as you can and drink plenty of fluids.  Go to the emergency department if  worsening at any time.    ED Prescriptions     Medication Sig Dispense Auth. Provider   doxycycline  (VIBRAMYCIN ) 100 MG capsule Take 1 capsule (100 mg total) by mouth 2 (two) times daily. 20 capsule Stuart Vernell Norris, PA-C   guaiFENesin  (MUCINEX ) 600 MG 12 hr tablet Take 1 tablet (600 mg total) by mouth 2 (two) times daily as needed. 20 tablet Stuart Vernell Norris, PA-C   promethazine -dextromethorphan (PROMETHAZINE -DM) 6.25-15 MG/5ML syrup Take 5 mLs by mouth 4 (four) times daily as needed. 100 mL Stuart Vernell Norris, [place]      PDMP not reviewed this encounter.    [1]  Social History Tobacco Use   Smoking status: Never   Smokeless tobacco: Never  Vaping Use   Vaping status: Never Used  Substance Use Topics   Alcohol  use: No   Drug use: No     Stuart Vernell Norris, PA-C 05/31/24 1222

## 2024-06-02 ENCOUNTER — Ambulatory Visit: Payer: Self-pay | Admitting: Gynecologic Oncology

## 2024-06-02 ENCOUNTER — Telehealth: Payer: Self-pay | Admitting: *Deleted

## 2024-06-02 LAB — SURGICAL PATHOLOGY

## 2024-06-02 NOTE — Telephone Encounter (Signed)
 Spoke with patient and her spouse to follow up on after hours call from Saturday, December 13th. Pt states she is feeling better today and her cough is under control. Advised patient to follow medication and advise from her urgent care visit and keep herself well hydrated. Pt reminded that Dr. Viktoria sent her a MyChart message in regards to her pathology results. Pt verbalized understanding and will check her MyChart account and is aware to call the office with any future concerns or questions.

## 2024-06-15 ENCOUNTER — Encounter: Payer: Self-pay | Admitting: *Deleted

## 2024-06-25 ENCOUNTER — Encounter: Payer: Self-pay | Admitting: Gynecologic Oncology

## 2024-06-25 ENCOUNTER — Inpatient Hospital Stay: Attending: Gynecologic Oncology | Admitting: Gynecologic Oncology

## 2024-06-25 VITALS — BP 136/88 | HR 64 | Temp 97.9°F | Resp 18 | Wt 100.2 lb

## 2024-06-25 DIAGNOSIS — Z90722 Acquired absence of ovaries, bilateral: Secondary | ICD-10-CM

## 2024-06-25 DIAGNOSIS — C541 Malignant neoplasm of endometrium: Secondary | ICD-10-CM

## 2024-06-25 DIAGNOSIS — Z7189 Other specified counseling: Secondary | ICD-10-CM

## 2024-06-25 DIAGNOSIS — Z90710 Acquired absence of both cervix and uterus: Secondary | ICD-10-CM

## 2024-06-25 DIAGNOSIS — Z9079 Acquired absence of other genital organ(s): Secondary | ICD-10-CM

## 2024-06-25 NOTE — Patient Instructions (Signed)
 It was great to see you today.  You are healing very well from surgery!  Please remember, nothing in the vagina for 12 weeks after surgery.  Based on your final pathology, you do not need any treatment after the surgery.  We will follow you a little bit closer, which means visits alternating initially between my office and your OB/GYN every 6 months.  We will see you for your first visit in 6 months.  If you develop any of the symptoms that we discussed today which include vaginal bleeding, pelvic pain, change to bowel or bladder function, or unintentional weight loss between visits, please call to see me sooner.

## 2024-06-25 NOTE — Progress Notes (Addendum)
 Gynecologic Oncology Return Clinic Visit  06/25/2024  Reason for Visit: follow-up, treatment planning  Treatment History: Oncology History  Malignant neoplasm of upper-outer quadrant of right breast in female, estrogen receptor positive (HCC)  11/16/2015 Breast US    Targeted ultrasound is performed, showing an irregular, hypoechoic mass at 12 o'clock, 1 cm from the nipple measuring 1.2 x 0.8 x 0.9 cm, corresponding to the area of concern within the superior right breast.    11/16/2015 Initial Biopsy   (R) breast needle biopsy (12:00): IDC with calcs, DCIS with calcs, grade 1. ER+ (100%), PR-, Ki67 5%, HER2 neg (ratio 1.00).    11/17/2015 Initial Diagnosis   Breast cancer of upper-outer quadrant of right female breast (HCC)   12/22/2015 Surgery   (R) lumpectomy with SLNB Jeoffrey): IDC, spanning 1.1 cm, DCIS with calcs, tumor focally <0.1 cm from anterior margin. 0/1 SLN.   pT1c, pN0: Stage IA    12/22/2015 Oncotype testing   Recurrence score: 21; ROR 14% (intermediate risk)    02/06/2016 - 03/05/2016 Radiation Therapy   Adjuvant breast radiation Valene). Right breast: 42.5 Gy in 17 fx. Right breast boost: 7.5 Gy in 3 fx   03/2016 -  Anti-estrogen oral therapy   Tamoxifen  20 mg daily. Planned duration of therapy: 5 years   02/09/2019 Genetic Testing   Negative genetic testing on the [facility] Common Hereditary Cancers panel.  The Common Hereditary Cancers Panel offered by [facility] includes sequencing and/or deletion duplication testing of the following 48 genes: APC, ATM, AXIN2, BARD1, BMPR1A, BRCA1, BRCA2, BRIP1, CDH1, CDK4, CDKN2A (p14ARF), CDKN2A (p16INK4a), CHEK2, CTNNA1, DICER1, EPCAM (Deletion/duplication testing only), GREM1 (promoter region deletion/duplication testing only), KIT, MEN1, MLH1, MSH2, MSH3, MSH6, MUTYH, NBN, NF1, NHTL1, PALB2, PDGFRA, PMS2, POLD1, POLE, PTEN, RAD50, RAD51C, RAD51D, RNF43, SDHB, SDHC, SDHD, SMAD4, SMARCA4. STK11, TP53, TSC1, TSC2, and VHL.  The following genes  were evaluated for sequence changes only: SDHA and HOXB13 c.251G>A variant only.     EMB 02/27/2019: benign endocervical glands, no endometrial tissue. EMB 03/11/19: minute fragments of benign endometrium. EMB 07/29/19: endometrial type polyp, scant strips of inactive endometrium. EMB 08/15/20: Uterine polyp, no malignancy. EMB 03/27/21: Scant strips of inactive endometrium, no overt malignancy. EMB 11/03/2021 showed endometrial polyp, fragments of inactive endometrium, no hyperplasaa or malignancy. 05/11/24: Hysteroscopy with polypectomy using Myosure.  Pathology revealed scattered foci of FIGO grade 1 endometrioid carcinoma arising in a polyp. MMRp. 05/28/24: Robotic-assisted laparoscopic total hysterectomy with bilateral salpingoophorectomy, SLN biopsy bilaterally   Interval History: Doing well.  Has occasional pain in her pelvis.  Having some constipation still, taking bowel regimen regularly at night.  Denies urinary symptoms.  Had some very minimal spotting initially after surgery, denies any recent vaginal bleeding.  Past Medical/Surgical History: Past Medical History:  Diagnosis Date   Allergy     Arthritis    bilateral hips   Asthma    environmental now resolved   Brain aneurysm    rupture, coiled, in 2003   Breast cancer (HCC) 11/16/2015   Right Breast   Chronic fatigue    Complication of anesthesia    Hard to wake up   Cough    Endometrial cancer (HCC)    Family history of breast cancer    Family history of lung cancer    GERD (gastroesophageal reflux disease)    Heart murmur    years ago, when I was young   Hemangioma    History of hiatal hernia    Hyperlipidemia    IBS (irritable bowel  syndrome)    Iron  deficiency anemia    Kyphosis of thoracic region    MVP (mitral valve prolapse)    Osteopenia    Personal history of radiation therapy    SAH (subarachnoid hemorrhage) (HCC)    Vertigo     Past Surgical History:  Procedure Laterality Date   APPENDECTOMY      BREAST BIOPSY Right 11/16/2015   U/S Core- Malignant   BREAST BIOPSY Left 06/21/2005   Stereo- Benign   BREAST EXCISIONAL BIOPSY Left 2007   BREAST LUMPECTOMY Right 12/22/2015   BREAST LUMPECTOMY WITH RADIOACTIVE SEED AND SENTINEL LYMPH NODE BIOPSY Right 12/22/2015   Procedure: BREAST LUMPECTOMY WITH RADIOACTIVE SEED AND SENTINEL LYMPH NODE BIOPSY;  Surgeon: Alm Angle, MD;  Location: [facility];  Service: General;  Laterality: Right;   COLONOSCOPY     DILATATION & CURRETTAGE/HYSTEROSCOPY WITH RESECTOCOPE N/A 05/11/2024   Procedure: DILATATION & CURETTAGE/HYSTEROSCOPY WITH RESECTOCOPE;  Surgeon: Horacio Boas, MD;  Location: [facility] OR;  Service: Gynecology;  Laterality: N/A;   FOOT SURGERY Right    bunionectomy   INJECTION, FOR SENTINEL LYMPH NODE IDENTIFICATION N/A 05/28/2024   Procedure: INJECTION, FOR SENTINEL LYMPH NODE IDENTIFICATION;  Surgeon: Viktoria Comer SAUNDERS, MD;  Location: [facility];  Service: Gynecology;  Laterality: N/A;   LYMPH NODE BIOPSY N/A 05/28/2024   Procedure: LYMPH NODE BIOPSY;  Surgeon: Viktoria Comer SAUNDERS, MD;  Location: [facility];  Service: Gynecology;  Laterality: N/A;   MOUTH SURGERY     MYOSURE RESECTION N/A 05/11/2024   Procedure: MELINDA RESECTION;  Surgeon: Horacio Boas, MD;  Location: [facility] OR;  Service: Gynecology;  Laterality: N/A;   ROBOTIC ASSISTED TOTAL HYSTERECTOMY WITH BILATERAL SALPINGO OOPHERECTOMY Bilateral 05/28/2024   Procedure: HYSTERECTOMY, TOTAL, ROBOT-ASSISTED, LAPAROSCOPIC, WITH BILATERAL SALPINGO-OOPHORECTOMY;  Surgeon: Viktoria Comer SAUNDERS, MD;  Location: [facility];  Service: Gynecology;  Laterality: Bilateral;  POSSIBLE LAPAROTOMY   SAH  2003   s/p embolization per Dr. Dolphus   TONSILLECTOMY     TOTAL HIP ARTHROPLASTY Left 01/11/2017   Procedure: LEFT TOTAL HIP ARTHROPLASTY ANTERIOR APPROACH;  Surgeon: Barbarann Oneil BROCKS, MD;  Location: [name] OR;  Service: Orthopedics;  Laterality: Left;   TOTAL HIP ARTHROPLASTY Right 02/03/2021    Procedure: RIGHT TOTAL HIP ARTHROPLASTY ANTERIOR APPROACH;  Surgeon: Barbarann Oneil BROCKS, MD;  Location: [facility] OR;  Service: Orthopedics;  Laterality: Right;   TUBAL LIGATION     UPPER GASTROINTESTINAL ENDOSCOPY      Family History  Problem Relation Age of Onset   Hyperlipidemia Mother    Breast cancer Mother 53   Heart attack Father    Diabetes Brother    Breast cancer Maternal Aunt        diagnosed 74s or 58s   Breast cancer Paternal Aunt 68       diagnosed in her 49s   Lung cancer Paternal Uncle    Colon cancer Neg Hx    Colon polyps Neg Hx    Esophageal cancer Neg Hx    Rectal cancer Neg Hx    Stomach cancer Neg Hx     Social History   Socioeconomic History   Marital status: Married    Spouse name: Not on file   Number of children: Not on file   Years of education: Not on file   Highest education level: Not on file  Occupational History   Occupation: CSR  Tobacco Use   Smoking status: Never   Smokeless tobacco: Never  Vaping Use   Vaping status: Never  Used  Substance and Sexual Activity   Alcohol use: No   Drug use: No   Sexual activity: Not on file  Other Topics Concern   Not on file  Social History Narrative   Not on file   Social Drivers of Health   Tobacco Use: Low Risk (06/25/2024)   Patient History    Smoking Tobacco Use: Never    Smokeless Tobacco Use: Never    Passive Exposure: Not on file  Financial Resource Strain: Low Risk (02/22/2024)   Received from [facility]   Overall Financial Resource Strain (CARDIA)    How hard is it for you to pay for the very basics like food, housing, medical care, and heating?: Not hard at all  Food Insecurity: Unknown (02/22/2024)   Received from [facility]   Epic    Within the past 12 months, you worried that your food would run out before you got the money to buy more.: Never true    Ran Out of Food in the Last Year: Not on file  Transportation Needs: No Transportation Needs (02/22/2024)   Received from [facility]    In the past 12 months, has lack of transportation kept you from medical appointments or from getting medications?: No    In the past 12 months, has lack of transportation kept you from meetings, work, or from getting things needed for daily living?: No  Physical Activity: Insufficiently Active (02/22/2024)   Received from [facility]   Exercise Vital Sign    On average, how many days per week do you engage in moderate to strenuous exercise (like a brisk walk)?: 1 day    On average, how many minutes do you engage in exercise at this level?: 30 min  Stress: No Stress Concern Present (08/25/2023)   Received from [facility] - Occupational Stress Questionnaire    Feeling of Stress : Only a little  Social Connections: Socially Integrated (02/22/2024)   Received from [facility]   Social Network    How would you rate your social network (family, work, friends)?: Good participation with social networks  Depression (PHQ2-9): Low Risk (04/24/2024)   Depression (PHQ2-9)    PHQ-2 Score: 0  Alcohol Screen: Not on file  Housing: Low Risk (02/22/2024)   Received from [facility]    In the last 12 months, was there a time when you were not able to pay the mortgage or rent on time?: No    In the past 12 months, how many times have you moved where you were living?: 0    At any time in the past 12 months, were you homeless or living in a shelter (including now)?: No  Utilities: Not At Risk (08/25/2023)   Received from [facility] Utilities    Threatened with loss of utilities: No  Health Literacy: Not on file    Current Medications: Current Medications[1]  Review of Systems: + constipation Denies appetite changes, fevers, chills, fatigue, unexplained weight changes. Denies hearing loss, neck lumps or masses, mouth sores, ringing in ears or voice changes. Denies cough or wheezing.  Denies shortness of breath. Denies chest pain or  palpitations. Denies leg swelling. Denies abdominal distention, pain, blood in stools, diarrhea, nausea, vomiting, or early satiety. Denies pain with intercourse, dysuria, frequency, hematuria or incontinence. Denies hot flashes, vaginal bleeding or vaginal discharge.   Denies joint pain, back pain or muscle  pain/cramps. Denies itching, rash, or wounds. Denies dizziness, headaches, numbness or seizures. Denies swollen lymph nodes or glands, denies easy bruising or bleeding. Denies anxiety, depression, confusion, or decreased concentration.  Physical Exam: BP 136/88   Pulse 64   Temp 97.9 F (36.6 C) (Oral)   Resp 18   Wt 100 lb 3.2 oz (45.5 kg)   SpO2 98%   BMI 16.67 kg/m  General: Alert, oriented, no acute distress. HEENT: Posterior oropharynx clear, sclera anicteric. Chest: Unlabored breathing on room air. Cardiovascular: Regular rate and rhythm, no murmurs. Abdomen: soft, nontender.  Normoactive bowel sounds.  No masses or hepatosplenomegaly appreciated.  Well-healed incisions. Extremities: Grossly normal range of motion.  Warm, well perfused.  No edema bilaterally. GU: Normal appearing external genitalia without erythema, excoriation, or lesions.  Speculum exam reveals cuff intact, suture visible, suture noted at the distal posterior vagina, laceration healing well.  No significant discharge or erythema.  Bimanual exam reveals cuff is intact, no tenderness or fluctuance on palpation.    Laboratory & Radiologic Studies: A. SENTINEL LYMPH NODE, RIGHT OBTURATOR, EXCISION: One lymph node negative for metastatic carcinoma (0/1).  B. SENTINEL LYMPH NODE, LEFT OBTURATOR, EXCISION: One lymph node negative for metastatic carcinoma (0/1).  C. UTERUS, CERVIX, FALLOPIAN TUBE, OVARY, BILATERAL, HYSTERECTOMY: Endometrium     Inactive endometrium.     Benign endometrial polyp, 3.5 cm.     Negative for endometrial intraepithelial neoplasia (EIN) and residual carcinoma.     See  comment. Cervix     Nabothian cysts.     Negative for dysplasia and malignancy. Myometrium     Adenomyosis.     Leiomyomata.     Negative for malignancy. Bilateral ovaries     Unremarkable.     Negative for endometriosis and malignancy. Bilateral fallopian tubes     Benign paratubal cyst.     Negative for endometriosis and malignancy.  ONCOLOGY TABLE: UTERUS, CARCINOMA OR CARCINOSARCOMA: Resection Procedure: Total hysterectomy with bilateral salpingo-oophorectomy and right and left obturator lymph nodes Histologic Type: Endometrioid adenocarcinoma (see previous endometrial biopsy pathology report ([id number]).  There is no residual carcinoma in the hysterectomy specimen. Histologic Grade: FIGO grade 1 Myometrial Invasion:      Depth of Myometrial Invasion (mm): 0 mm      Myometrial Thickness (mm): 12 mm      Percentage of Myometrial Invasion: 0% Uterine Serosa Involvement: Not identified Cervical stromal Involvement: Not identified Extent of involvement of other tissue/organs: Not identified Peritoneal/Ascitic Fluid: Not submitted Lymphovascular Invasion: Not identified Regional Lymph Nodes:      Pelvic Lymph Nodes Examined:          2 Sentinel          0 non-sentinel          2 total      Pelvic Lymph Nodes with Metastasis: 0          Macrometastasis: (>2.0 mm): 0          Micrometastasis: (>0.2 mm and < 2.0 mm): 0          Isolated Tumor Cells (<0.2 mm): 0 Distant Metastasis:      Distant Site(s) Involved: Not applicable Pathologic Stage Classification (pTNM, AJCC 8th Edition): pT1a, see comment, pN0 Ancillary Studies: No residual carcinoma in hysterectomy Representative Tumor Block: No residual carcinoma in hysterectomy Comment(s): The endometrium has a 3.5 cm polyp without features EIN or malignancy.  The TNM is pT1a, pN0 and is based on the carcinoma reported in the previous  endometrial biopsy specimen ([id number]). Immunohistochemistry for cytokeratin AE1/AE3  performed on the sentinel lymph nodes (parts A and B) is negative. (v4.2.0.1)   Assessment & Plan: Alexis [name] is a 74 y.o. 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  Patient is overall doing well.  Meeting postoperative milestones.  Discussed continued expectations and restrictions.  Encouraged continued bowel regimen to treat constipation.  Reviewed pathology with her from surgery.  She was given a copy of this report.  Given no residual tumor the setting of low-grade, early stage disease, discussed no adjuvant treatment indicated.  Reviewed follow-up plan of visits every 6 months for 5 years, which we will alternate between my office and her OB/GYN.  We will see her for follow-up in 6 months.  We discussed her tamoxifen , which had been on hold since her diagnosis.  From an endometrial cancer perspective, would be acceptable for her to restart tamoxifen  or to be on an aromatase inhibitor.  20 minutes of total time was spent for this patient encounter, including preparation, face-to-face counseling with the patient and coordination of care, and documentation of the encounter.  Comer Dollar, MD  Division of Gynecologic Oncology  Department of Obstetrics and Gynecology  [facility]      [1]  Current Outpatient Medications:    aspirin  EC 81 MG tablet, Take 81 mg by mouth daily. Swallow whole., Disp: , Rfl:    B Complex-C (SUPER B COMPLEX/VITAMIN C  PO), Take 1 tablet by mouth every evening., Disp: , Rfl:    Cholecalciferol  (VITAMIN D3) 50 MCG (2000 UT) TABS, Take 2,000 Units by mouth in the morning., Disp: , Rfl:    docusate sodium  (COLACE) 100 MG capsule, Take 100 mg by mouth daily at 12 noon., Disp: , Rfl:    doxycycline  (VIBRAMYCIN ) 100 MG capsule, Take 1 capsule (100 mg total) by mouth 2 (two) times daily., Disp: 20 capsule, Rfl:  0   esomeprazole  (NEXIUM ) 20 MG capsule, Take 20 mg by mouth 2 (two) times daily., Disp: , Rfl:    famotidine  (PEPCID ) 20 MG tablet, Take 20 mg by mouth 2 (two) times daily., Disp: , Rfl:    ferrous gluconate  (FERGON) 324 MG tablet, Take 1 tablet (324 mg total) by mouth every other day., Disp: 60 tablet, Rfl: 3   guaiFENesin  (MUCINEX ) 600 MG 12 hr tablet, Take 1 tablet (600 mg total) by mouth 2 (two) times daily as needed., Disp: 20 tablet, Rfl: 0   pravastatin  (PRAVACHOL ) 40 MG tablet, TAKE 1 TABLET BY MOUTH EVERY DAY IN THE EVENING, Disp: 90 tablet, Rfl: 0   promethazine -dextromethorphan (PROMETHAZINE -DM) 6.25-15 MG/5ML syrup, Take 5 mLs by mouth 4 (four) times daily as needed., Disp: 100 mL, Rfl: 0   senna-docusate (SENOKOT-S) 8.6-50 MG tablet, Take 2 tablets by mouth at bedtime. For AFTER surgery, do not take if having diarrhea, Disp: 30 tablet, Rfl: 0   tamoxifen  (NOLVADEX ) 20 MG tablet, TAKE 1 TABLET BY MOUTH EVERYDAY AT BEDTIME, Disp: 90 tablet, Rfl: 4

## 2024-07-10 ENCOUNTER — Encounter: Payer: Self-pay | Admitting: Emergency Medicine

## 2024-07-10 ENCOUNTER — Ambulatory Visit
Admission: EM | Admit: 2024-07-10 | Discharge: 2024-07-10 | Disposition: A | Discharge status: Home / Self Care | Attending: Physician Assistant | Admitting: Physician Assistant

## 2024-07-10 DIAGNOSIS — K1379 Other lesions of oral mucosa: Secondary | ICD-10-CM

## 2024-07-10 MED ORDER — NYSTATIN 100000 UNIT/ML MT SUSP: 5.0000 mL | Freq: Four times a day (QID) | OROMUCOSAL | 0 refills | Status: DC

## 2024-07-10 MED ORDER — NYSTATIN 100000 UNIT/ML MT SUSP: 15.0000 mL | Freq: Four times a day (QID) | OROMUCOSAL | 0 refills | Status: DC

## 2024-07-10 MED ORDER — FLUCONAZOLE 100 MG PO TABS: 100.0000 mg | ORAL_TABLET | Freq: Every day | ORAL | 0 refills | Status: DC

## 2024-07-10 NOTE — ED Triage Notes (Signed)
 Salty taste x 1 month.  States mouth is sore inside off and on x 1 month

## 2024-07-10 NOTE — Discharge Instructions (Addendum)
Follow up with your provider for recheck

## 2024-07-10 NOTE — ED Provider Notes (Signed)
 " [facility]    CSN: [id number] Arrival date & time: 07/10/24  1524      History   Chief Complaint No chief complaint on file.   HPI Alexis [name] is a 74 y.o. female.   Patient complains of pain in her tongue and her lips.  Patient reports that the symptoms began today.  Patient reports that she has been treated here for the same thing in the past.  Patient states that she is having a lot of pain in her tongue and in her full mouth.  Patient also reports discomfort in her lips.  Patient is here with her spouse who assist with history.  Patient tells me that she was seen here previously for the same and treated here.  The history is provided by the patient and the spouse.    Past Medical History:  Diagnosis Date   Allergy     Arthritis    bilateral hips   Asthma    environmental now resolved   Brain aneurysm    rupture, coiled, in 2003   Breast cancer (HCC) 11/16/2015   Right Breast   Chronic fatigue    Complication of anesthesia    Hard to wake up   Cough    Endometrial cancer (HCC)    Family history of breast cancer    Family history of lung cancer    GERD (gastroesophageal reflux disease)    Heart murmur    years ago, when I was young   Hemangioma    History of hiatal hernia    Hyperlipidemia    IBS (irritable bowel syndrome)    Iron  deficiency anemia    Kyphosis of thoracic region    MVP (mitral valve prolapse)    Osteopenia    Personal history of radiation therapy    SAH (subarachnoid hemorrhage) (HCC)    Vertigo     Patient Active Problem List   Diagnosis Date Noted   Endometrial cancer (HCC) 05/21/2024   HLD (hyperlipidemia) 08/16/2021   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/09/2021   Genetic testing 02/09/2019   Family history of breast cancer    Family history of lung cancer    Upper airway cough syndrome 04/30/2017   Palpitations 04/30/2017   Chronic bilateral low back pain 02/28/2017   Malignant neoplasm of upper-outer quadrant of  right breast in female, 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   Microcytic anemia 04/22/2015   Premature atrial contractions 06/24/2009   HEMANGIOMA 05/02/2007   SAH  h/o 2003 05/02/2007   GERD 05/02/2007   IRRITABLE BOWEL SYNDROME 05/02/2007   KYPHOSCOLIOSIS, 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    Past Surgical History:  Procedure Laterality Date   APPENDECTOMY     BREAST BIOPSY Right 11/16/2015   U/S Core- Malignant   BREAST BIOPSY Left 06/21/2005   Stereo- Benign   BREAST EXCISIONAL BIOPSY Left 2007   BREAST LUMPECTOMY Right 12/22/2015   BREAST LUMPECTOMY WITH RADIOACTIVE SEED AND SENTINEL LYMPH NODE BIOPSY Right 12/22/2015   Procedure: BREAST LUMPECTOMY WITH RADIOACTIVE SEED AND SENTINEL LYMPH NODE BIOPSY;  Surgeon: Alm Angle, MD;  Location: [facility];  Service: General;  Laterality: Right;   COLONOSCOPY     DILATATION & CURRETTAGE/HYSTEROSCOPY WITH RESECTOCOPE N/A 05/11/2024   Procedure: DILATATION & CURETTAGE/HYSTEROSCOPY WITH RESECTOCOPE;  Surgeon: Horacio Boas, MD;  Location: [facility];  Service: Gynecology;  Laterality: N/A;   FOOT SURGERY Right    bunionectomy   INJECTION, FOR SENTINEL LYMPH NODE IDENTIFICATION N/A 05/28/2024   Procedure:  INJECTION, FOR SENTINEL LYMPH NODE IDENTIFICATION;  Surgeon: Viktoria Comer SAUNDERS, MD;  Location: [facility];  Service: Gynecology;  Laterality: N/A;   LYMPH NODE BIOPSY N/A 05/28/2024   Procedure: LYMPH NODE BIOPSY;  Surgeon: Viktoria Comer SAUNDERS, MD;  Location: [facility];  Service: Gynecology;  Laterality: N/A;   MOUTH SURGERY     MYOSURE RESECTION N/A 05/11/2024   Procedure: MELINDA RESECTION;  Surgeon: Horacio Boas, MD;  Location: [facility];  Service: Gynecology;  Laterality: N/A;   ROBOTIC ASSISTED TOTAL HYSTERECTOMY WITH BILATERAL SALPINGO OOPHERECTOMY Bilateral 05/28/2024   Procedure: HYSTERECTOMY, TOTAL, ROBOT-ASSISTED, LAPAROSCOPIC, WITH BILATERAL SALPINGO-OOPHORECTOMY;  Surgeon: Viktoria Comer SAUNDERS, MD;  Location: [facility];   Service: Gynecology;  Laterality: Bilateral;  POSSIBLE LAPAROTOMY   SAH  2003   s/p embolization per Dr. Dolphus   TONSILLECTOMY     TOTAL HIP ARTHROPLASTY Left 01/11/2017   Procedure: LEFT TOTAL HIP ARTHROPLASTY ANTERIOR APPROACH;  Surgeon: Barbarann Oneil BROCKS, MD;  Location: [facility] OR;  Service: Orthopedics;  Laterality: Left;   TOTAL HIP ARTHROPLASTY Right 02/03/2021   Procedure: RIGHT TOTAL HIP ARTHROPLASTY ANTERIOR APPROACH;  Surgeon: Barbarann Oneil BROCKS, MD;  Location: [facility];  Service: Orthopedics;  Laterality: Right;   TUBAL LIGATION     UPPER GASTROINTESTINAL ENDOSCOPY      OB History     Gravida  2   Para  2   Term      Preterm      AB      Living         SAB      IAB      Ectopic      Multiple      Live Births               Home Medications    Prior to Admission medications  Medication Sig Start Date End Date Taking? Authorizing Provider  fluconazole (DIFLUCAN) 100 MG tablet Take 1 tablet (100 mg total) by mouth daily for 14 days. 07/10/24 07/24/24 Yes Deina Lipsey K, PA-C  magic mouthwash (nystatin, hydrocortisone, diphenhydrAMINE, lidocaine ) suspension Swish and spit 15 mLs 4 (four) times daily. 07/10/24  Yes Flint Sonny POUR, PA-C  aspirin  EC 81 MG tablet Take 81 mg by mouth daily. Swallow whole.    [provider]  B Complex-C (SUPER B COMPLEX/VITAMIN C  PO) Take 1 tablet by mouth every evening.    [provider]  Cholecalciferol  (VITAMIN D3) 50 MCG (2000 UT) TABS Take 2,000 Units by mouth in the morning.    [provider]  docusate sodium  (COLACE) 100 MG capsule Take 100 mg by mouth daily at 12 noon.    [provider]  doxycycline  (VIBRAMYCIN ) 100 MG capsule Take 1 capsule (100 mg total) by mouth 2 (two) times daily. 05/31/24   Stuart Vernell Norris, PA-C  esomeprazole  (NEXIUM ) 20 MG capsule Take 20 mg by mouth 2 (two) times daily.    [provider]  famotidine  (PEPCID ) 20 MG tablet Take 20 mg by mouth 2 (two) times  daily. 05/13/24   [provider]  ferrous gluconate  (FERGON) 324 MG tablet Take 1 tablet (324 mg total) by mouth every other day. 04/24/24   Geofm Delon BRAVO, NP  guaiFENesin  (MUCINEX ) 600 MG 12 hr tablet Take 1 tablet (600 mg total) by mouth 2 (two) times daily as needed. 05/31/24   Stuart Vernell Norris, PA-C  pravastatin  (PRAVACHOL ) 40 MG tablet TAKE 1 TABLET BY MOUTH EVERY DAY IN THE EVENING 07/31/22  Darlean Ozell NOVAK, MD  senna-docusate (SENOKOT-S) 8.6-50 MG tablet Take 2 tablets by mouth at bedtime. For AFTER surgery, do not take if having diarrhea 05/21/24   Micheline Setter D, NP  tamoxifen  (NOLVADEX ) 20 MG tablet TAKE 1 TABLET BY MOUTH EVERYDAY AT BEDTIME 05/08/24   Geofm Delon BRAVO, NP    Family History Family History  Problem Relation Age of Onset   Hyperlipidemia Mother    Breast cancer Mother 61   Heart attack Father    Diabetes Brother    Breast cancer Maternal Aunt        diagnosed 28s or 69s   Breast cancer Paternal Aunt 5       diagnosed in her 64s   Lung cancer Paternal Uncle    Colon cancer Neg Hx    Colon polyps Neg Hx    Esophageal cancer Neg Hx    Rectal cancer Neg Hx    Stomach cancer Neg Hx     Social History Social History[1]   Allergies   Latex   Review of Systems Review of Systems  Constitutional:  Negative for fever.  HENT:  Negative for facial swelling.   All other systems reviewed and are negative.    Physical Exam Triage Vital Signs ED Triage Vitals  Encounter Vitals Group     BP 07/10/24 1530 (!) 179/83     Girls Systolic BP Percentile --      Girls Diastolic BP Percentile --      Boys Systolic BP Percentile --      Boys Diastolic BP Percentile --      Pulse Rate 07/10/24 1530 78     Resp 07/10/24 1530 18     Temp 07/10/24 1530 97.9 F (36.6 C)     Temp Source 07/10/24 1530 Oral     SpO2 07/10/24 1530 96 %     Weight --      Height --      Head Circumference --      Peak Flow --      Pain Score 07/10/24 1532 8      Pain Loc --      Pain Education --      Exclude from Growth Chart --    No data found.  Updated Vital Signs BP (!) 179/83 (BP Location: Left Arm)   Pulse 78   Temp 97.9 F (36.6 C) (Oral)   Resp 18   SpO2 96%   Visual Acuity Right Eye Distance:   Left Eye Distance:   Bilateral Distance:    Right Eye Near:   Left Eye Near:    Bilateral Near:     Physical Exam Vitals and nursing note reviewed.  Constitutional:      Appearance: She is well-developed.  HENT:     Head: Normocephalic.     Nose: Nose normal.     Mouth/Throat:     Comments: Erythema tongue and top of mouth, throat is erythematous Cardiovascular:     Rate and Rhythm: Normal rate.  Pulmonary:     Effort: Pulmonary effort is normal.  Abdominal:     General: There is no distension.  Musculoskeletal:        General: Normal range of motion.     Cervical back: Normal range of motion.  Skin:    General: Skin is warm.  Neurological:     General: No focal deficit present.     Mental Status: She is alert and oriented to person, place, and time.  UC Treatments / Results  Labs (all labs ordered are listed, but only abnormal results are displayed) Labs Reviewed - No data to display  EKG   Radiology No results found.  Procedures Procedures (including critical care time)  Medications Ordered in UC Medications - No data to display  Initial Impression / Assessment and Plan / UC Course  I have reviewed the triage vital signs and the nursing notes.  Pertinent labs & imaging results that were available during my care of the patient were reviewed by me and considered in my medical decision making (see chart for details).     I reviewed patient's records patient has not been seen here for any mouth or tongue discomfort in the past.  Patient husband states he thinks that it was at a different urgent care.  Patient does have erythema to her tongue and her mouth.  Patient may have early thrush.  I will  treat the patient with Diflucan.  Patient is prescribed Magic mouthwash as this may help with the symptoms as well.  Patient is advised to follow-up with her primary care physician for recheck. Final Clinical Impressions(s) / UC Diagnoses   Final diagnoses:  Oral pain     Discharge Instructions      Follow up with your provider for recheck.    ED Prescriptions     Medication Sig Dispense Auth. Provider   nystatin (MYCOSTATIN) 100000 UNIT/ML suspension  (Status: Discontinued) Take 5 mLs (500,000 Units total) by mouth 4 (four) times daily. 60 mL Sonnia Strong K, PA-C   magic mouthwash (nystatin, hydrocortisone, diphenhydrAMINE, lidocaine ) suspension Swish and spit 15 mLs 4 (four) times daily. 540 mL Kevante Lunt K, PA-C   fluconazole (DIFLUCAN) 100 MG tablet Take 1 tablet (100 mg total) by mouth daily for 14 days. 14 tablet Nashira Mcglynn K, PA-C      PDMP not reviewed this encounter.    An After Visit Summary was printed and given to the patient.     [1]  Social History Tobacco Use   Smoking status: Never   Smokeless tobacco: Never  Vaping Use   Vaping status: Never Used  Substance Use Topics   Alcohol use: No   Drug use: No     Flint Sonny POUR, PA-C 07/10/24 1706  "

## 2024-07-15 ENCOUNTER — Encounter: Payer: Self-pay | Admitting: Gynecologic Oncology

## 2024-07-15 ENCOUNTER — Telehealth: Payer: Self-pay | Admitting: *Deleted

## 2024-07-15 NOTE — Telephone Encounter (Addendum)
 Spoke with patient who states her husband sent the [employer].   Patient reports that the area of discomfort is not inside her vagina. Pt describes the discomfort as some what burning. Pt having a difficult time describing the area of concern. She reports it as being on the outside of the vagina. Pt states it maybe somewhat swollen and she hasn't used a mirror to look at the area.  Describes the area as feeling like the size of a black-eyed-pea when asking patient if the area is on her vulva or labia she couldn't answer.   Patient denies fever,chills redness and no vaginal bleeding or discharge. Reiterated to patient that she did have a vaginal tear during her procedure and the sutures will dissolve. Pt reports following  all her post op activity restrictions and has not placed anything in the vagina. Pt has a follow up with her gyn in 3 months.   Advised patient to monitor the area and her message will be relayed to providers and the office will call back with any new recommendations.

## 2024-07-23 ENCOUNTER — Ambulatory Visit (INDEPENDENT_AMBULATORY_CARE_PROVIDER_SITE_OTHER)

## 2024-07-23 ENCOUNTER — Encounter: Payer: Self-pay | Admitting: Emergency Medicine

## 2024-07-23 ENCOUNTER — Ambulatory Visit
Admission: EM | Admit: 2024-07-23 | Discharge: 2024-07-23 | Disposition: A | Discharge status: Home / Self Care | Source: Home / Self Care

## 2024-07-23 DIAGNOSIS — R051 Acute cough: Secondary | ICD-10-CM | POA: Diagnosis not present

## 2024-07-23 DIAGNOSIS — J209 Acute bronchitis, unspecified: Secondary | ICD-10-CM

## 2024-07-23 DIAGNOSIS — B370 Candidal stomatitis: Secondary | ICD-10-CM | POA: Diagnosis not present

## 2024-07-23 DIAGNOSIS — K1379 Other lesions of oral mucosa: Secondary | ICD-10-CM | POA: Diagnosis not present

## 2024-07-23 MED ORDER — ALBUTEROL SULFATE HFA 108 (90 BASE) MCG/ACT IN AERS: 1.0000 | INHALATION_SPRAY | Freq: Four times a day (QID) | RESPIRATORY_TRACT | 0 refills | Status: AC | PRN

## 2024-07-23 MED ORDER — NYSTATIN 100000 UNIT/ML MT SUSP: 500000.0000 [IU] | Freq: Four times a day (QID) | OROMUCOSAL | 0 refills | Status: AC

## 2024-07-23 MED ORDER — IPRATROPIUM-ALBUTEROL 0.5-2.5 (3) MG/3ML IN SOLN
3.0000 mL | Freq: Once | RESPIRATORY_TRACT | Status: AC
  Administered 2024-07-23: 3 mL via RESPIRATORY_TRACT

## 2024-07-23 MED ORDER — BENZONATATE 100 MG PO CAPS: 100.0000 mg | ORAL_CAPSULE | Freq: Three times a day (TID) | ORAL | 0 refills | Status: AC

## 2024-07-23 MED ORDER — PREDNISONE 20 MG PO TABS: 40.0000 mg | ORAL_TABLET | Freq: Every day | ORAL | 0 refills | Status: AC

## 2024-07-23 NOTE — ED Provider Notes (Signed)
 " [facility]    CSN: [id number] Arrival date & time: 07/23/24  1248      History   Chief Complaint Chief Complaint  Patient presents with   Medication Refill    HPI Alexis [name] is a 74 y.o. female.   Patient presents with concerns for oral pain that began again approximately 3 days ago.  Patient reports that she was seen here on 1/23 for on and off mouth soreness and was prescribed nystatin  mouthwash at that time which she reports she used with significant relief, but her symptoms have returned over the last few days.  Patient also with cough that began 6 days ago.  Patient reports that her cough worsens at night and she seems to have some chest congestion with this.  Patient also endorses some mild nasal congestion.  Patient denies taking medication for the symptoms.  Patient denies any shortness of breath, chest pain, or fever.  Patient reports history of environmental asthma that has since resolved since retiring and she has not had any issues with this since.  Denies any history of COPD.  The history is provided by the patient and medical records.  Medication Refill   Past Medical History:  Diagnosis Date   Allergy     Arthritis    bilateral hips   Asthma    environmental now resolved   Brain aneurysm    rupture, coiled, in 2003   Breast cancer (HCC) 11/16/2015   Right Breast   Chronic fatigue    Complication of anesthesia    Hard to wake up   Cough    Endometrial cancer (HCC)    Family history of breast cancer    Family history of lung cancer    GERD (gastroesophageal reflux disease)    Heart murmur    years ago, when I was young   Hemangioma    History of hiatal hernia    Hyperlipidemia    IBS (irritable bowel syndrome)    Iron  deficiency anemia    Kyphosis of thoracic region    MVP (mitral valve prolapse)    Osteopenia    Personal history of radiation therapy    SAH (subarachnoid hemorrhage) (HCC)    Vertigo     Patient Active Problem  List   Diagnosis Date Noted   Endometrial cancer (HCC) 05/21/2024   HLD (hyperlipidemia) 08/16/2021   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/09/2021   Genetic testing 02/09/2019   Family history of breast cancer    Family history of lung cancer    Upper airway cough syndrome 04/30/2017   Palpitations 04/30/2017   Chronic bilateral low back pain 02/28/2017   Malignant neoplasm of upper-outer quadrant of right breast in female, 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   Microcytic anemia 04/22/2015   Premature atrial contractions 06/24/2009   HEMANGIOMA 05/02/2007   SAH  h/o 2003 05/02/2007   GERD 05/02/2007   IRRITABLE BOWEL SYNDROME 05/02/2007   KYPHOSCOLIOSIS, 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    Past Surgical History:  Procedure Laterality Date   APPENDECTOMY     BREAST BIOPSY Right 11/16/2015   U/S Core- Malignant   BREAST BIOPSY Left 06/21/2005   Stereo- Benign   BREAST EXCISIONAL BIOPSY Left 2007   BREAST LUMPECTOMY Right 12/22/2015   BREAST LUMPECTOMY WITH RADIOACTIVE SEED AND SENTINEL LYMPH NODE BIOPSY Right 12/22/2015   Procedure: BREAST LUMPECTOMY WITH RADIOACTIVE SEED AND SENTINEL LYMPH NODE BIOPSY;  Surgeon: Alm Angle, MD;  Location: [facility];  Service: General;  Laterality: Right;   COLONOSCOPY     DILATATION & CURRETTAGE/HYSTEROSCOPY WITH RESECTOCOPE N/A 05/11/2024   Procedure: DILATATION & CURETTAGE/HYSTEROSCOPY WITH RESECTOCOPE;  Surgeon: Horacio Boas, MD;  Location: [facility] OR;  Service: Gynecology;  Laterality: N/A;   FOOT SURGERY Right    bunionectomy   INJECTION, FOR SENTINEL LYMPH NODE IDENTIFICATION N/A 05/28/2024   Procedure: INJECTION, FOR SENTINEL LYMPH NODE IDENTIFICATION;  Surgeon: Viktoria Comer SAUNDERS, MD;  Location: [facility];  Service: Gynecology;  Laterality: N/A;   LYMPH NODE BIOPSY N/A 05/28/2024   Procedure: LYMPH NODE BIOPSY;  Surgeon: Viktoria Comer SAUNDERS, MD;  Location: [facility];  Service: Gynecology;  Laterality: N/A;   MOUTH SURGERY      MYOSURE RESECTION N/A 05/11/2024   Procedure: MELINDA RESECTION;  Surgeon: Horacio Boas, MD;  Location: [facility] OR;  Service: Gynecology;  Laterality: N/A;   ROBOTIC ASSISTED TOTAL HYSTERECTOMY WITH BILATERAL SALPINGO OOPHERECTOMY Bilateral 05/28/2024   Procedure: HYSTERECTOMY, TOTAL, ROBOT-ASSISTED, LAPAROSCOPIC, WITH BILATERAL SALPINGO-OOPHORECTOMY;  Surgeon: Viktoria Comer SAUNDERS, MD;  Location: [facility];  Service: Gynecology;  Laterality: Bilateral;  POSSIBLE LAPAROTOMY   SAH  2003   s/p embolization per Dr. Dolphus   TONSILLECTOMY     TOTAL HIP ARTHROPLASTY Left 01/11/2017   Procedure: LEFT TOTAL HIP ARTHROPLASTY ANTERIOR APPROACH;  Surgeon: Barbarann Oneil BROCKS, MD;  Location: [facility] OR;  Service: Orthopedics;  Laterality: Left;   TOTAL HIP ARTHROPLASTY Right 02/03/2021   Procedure: RIGHT TOTAL HIP ARTHROPLASTY ANTERIOR APPROACH;  Surgeon: Barbarann Oneil BROCKS, MD;  Location: [facility] OR;  Service: Orthopedics;  Laterality: Right;   TUBAL LIGATION     UPPER GASTROINTESTINAL ENDOSCOPY      OB History     Gravida  2   Para  2   Term      Preterm      AB      Living         SAB      IAB      Ectopic      Multiple      Live Births               Home Medications    Prior to Admission medications  Medication Sig Start Date End Date Taking? Authorizing Provider  albuterol  (VENTOLIN  HFA) 108 (90 Base) MCG/ACT inhaler Inhale 1-2 puffs into the lungs every 6 (six) hours as needed for wheezing or shortness of breath. 07/23/24  Yes Johnie, Kallyn Demarcus A, NP  benzonatate  (TESSALON ) 100 MG capsule Take 1 capsule (100 mg total) by mouth every 8 (eight) hours. 07/23/24  Yes Johnie, Carlea Badour A, NP  nystatin  (MYCOSTATIN ) 100000 UNIT/ML suspension Take 5 mLs (500,000 Units total) by mouth 4 (four) times daily. 07/23/24  Yes Johnie, Vena Bassinger A, NP  predniSONE  (DELTASONE ) 20 MG tablet Take 2 tablets (40 mg total) by mouth daily for 5 days. 07/23/24 07/28/24 Yes Johnie Flaming A, NP  aspirin  EC 81 MG tablet Take 81  mg by mouth daily. Swallow whole.    [provider]  B Complex-C (SUPER B COMPLEX/VITAMIN C  PO) Take 1 tablet by mouth every evening.    [provider]  Cholecalciferol  (VITAMIN D3) 50 MCG (2000 UT) TABS Take 2,000 Units by mouth in the morning.    [provider]  docusate sodium  (COLACE) 100 MG capsule Take 100 mg by mouth daily at 12 noon.    [provider]  esomeprazole  (NEXIUM ) 20 MG capsule Take 20 mg by mouth 2 (two) times daily.    [provider]  famotidine  (PEPCID ) 20 MG tablet Take 20 mg by mouth 2 (two) times daily. 05/13/24   [provider]  ferrous gluconate  (FERGON) 324 MG tablet Take 1 tablet (324 mg total) by mouth every other day. 04/24/24   Geofm Delon BRAVO, NP  guaiFENesin  (MUCINEX ) 600 MG 12 hr tablet Take 1 tablet (600 mg total) by mouth 2 (two) times daily as needed. 05/31/24   Stuart Vernell Norris, PA-C  pravastatin  (PRAVACHOL ) 40 MG tablet TAKE 1 TABLET BY MOUTH EVERY DAY IN THE EVENING 07/31/22   Darlean Ozell NOVAK, MD  senna-docusate (SENOKOT-S) 8.6-50 MG tablet Take 2 tablets by mouth at bedtime. For AFTER surgery, do not take if having diarrhea 05/21/24   Micheline Setter D, NP  tamoxifen  (NOLVADEX ) 20 MG tablet TAKE 1 TABLET BY MOUTH EVERYDAY AT BEDTIME 05/08/24   Geofm Delon BRAVO, NP    Family History Family History  Problem Relation Age of Onset   Hyperlipidemia Mother    Breast cancer Mother 48   Heart attack Father    Diabetes Brother    Breast cancer Maternal Aunt        diagnosed 92s or 34s   Breast cancer Paternal Aunt 84       diagnosed in her 63s   Lung cancer Paternal Uncle    Colon cancer Neg Hx    Colon polyps Neg Hx    Esophageal cancer Neg Hx    Rectal cancer Neg Hx    Stomach cancer Neg Hx     Social History Social History[1]   Allergies   Latex   Review of Systems Review of Systems  Per HPI  Physical Exam Triage Vital Signs ED Triage Vitals  Encounter Vitals Group      BP 07/23/24 1315 (!) 180/78     Girls Systolic BP Percentile --      Girls Diastolic BP Percentile --      Boys Systolic BP Percentile --      Boys Diastolic BP Percentile --      Pulse Rate 07/23/24 1315 67     Resp 07/23/24 1315 18     Temp 07/23/24 1315 97.8 F (36.6 C)     Temp Source 07/23/24 1315 Oral     SpO2 07/23/24 1315 95 %     Weight --      Height --      Head Circumference --      Peak Flow --      Pain Score 07/23/24 1317 0     Pain Loc --      Pain Education --      Exclude from Growth Chart --    No data found.  Updated Vital Signs BP (!) 180/78 (BP Location: Right Arm)   Pulse (!) 53   Temp 97.8 F (36.6 C) (Oral)   Resp 18   SpO2 98%   Visual Acuity Right Eye Distance:   Left Eye Distance:   Bilateral Distance:    Right Eye Near:   Left Eye Near:    Bilateral Near:     Physical Exam Vitals and nursing note reviewed.  Constitutional:      General: She is awake. She is not in acute distress.    Appearance: Normal appearance. She is well-developed and well-groomed. She is not ill-appearing.  HENT:     Right Ear: Tympanic membrane, ear canal and external ear normal.     Left Ear: Tympanic membrane, ear canal and external ear  normal.     Nose: Congestion and rhinorrhea present.     Mouth/Throat:     Mouth: Mucous membranes are moist.     Pharynx: Posterior oropharyngeal erythema and postnasal drip present. No oropharyngeal exudate.     Tonsils: No tonsillar exudate.     Comments: Tongue appears mildly erythematous with white plaques consistent with oral thrush Cardiovascular:     Rate and Rhythm: Normal rate and regular rhythm.  Pulmonary:     Effort: Pulmonary effort is normal.     Breath sounds: Examination of the left-upper field reveals wheezing. Examination of the left-middle field reveals wheezing. Examination of the right-lower field reveals wheezing. Examination of the left-lower field reveals wheezing. Wheezing present.  Skin:     General: Skin is warm and dry.  Neurological:     General: No focal deficit present.     Mental Status: She is alert and oriented to person, place, and time. Mental status is at baseline.  Psychiatric:        Behavior: Behavior is cooperative.      UC Treatments / Results  Labs (all labs ordered are listed, but only abnormal results are displayed) Labs Reviewed - No data to display  EKG   Radiology DG Chest 2 View Result Date: 07/23/2024 CLINICAL DATA:  Cough and wheezing for 6 days EXAM: CHEST - 2 VIEW COMPARISON:  May 31, 2024 FINDINGS: The heart size and mediastinal contours are within normal limits. Both lungs are clear. Hyperinflation of the lungs is noted. Mild S-shaped scoliosis of thoracic and lumbar spine is noted. IMPRESSION: No active cardiopulmonary disease. Hyperinflation of the lungs. Electronically Signed   By: Lynwood Landy Raddle M.D.   On: 07/23/2024 13:59    Procedures Procedures (including critical care time)  Medications Ordered in UC Medications  ipratropium-albuterol  (DUONEB) 0.5-2.5 (3) MG/3ML nebulizer solution 3 mL (3 mLs Nebulization Given 07/23/24 1330)    Initial Impression / Assessment and Plan / UC Course  I have reviewed the triage vital signs and the nursing notes.  Pertinent labs & imaging results that were available during my care of the patient were reviewed by me and considered in my medical decision making (see chart for details).     Patient is overall well-appearing.  Vitals are stable.  DuoNeb given with relief of wheezing.  Chest x-ray ordered.  I independently interpreted these images and there is no active cardiopulmonary disease at this time.  Radiology report confirms this.  Suspect symptoms related to mild bronchitis.  Prescribed prednisone  burst.  Prescribed Tessalon  as needed for cough.  Prescribed albuterol  inhaler as needed for wheezing or shortness of breath.  Prescribed nystatin  suspension for oral thrush coverage.  Discussed  follow-up and return precautions. Final Clinical Impressions(s) / UC Diagnoses   Final diagnoses:  Oral pain  Oral thrush  Acute cough  Acute bronchitis, unspecified organism     Discharge Instructions      Your chest x-ray did not reveal any evidence of pneumonia.  I suspect your cough and wheezing could be related to a mild bronchitis.   Start taking 2 tablets of prednisone  once daily for 5 days to help with inflammation related to this. You can take Tessalon  every 8 hours as needed for cough. You can use albuterol  inhaler every 6 hours as needed 1 to 2 puffs for shortness of breath, wheezing, or cough.  Swish and spit nystatin  suspension 4 times daily to help with oral thrush. Follow-up with your primary care provider  or return here as needed.     ED Prescriptions     Medication Sig Dispense Auth. Provider   nystatin  (MYCOSTATIN ) 100000 UNIT/ML suspension Take 5 mLs (500,000 Units total) by mouth 4 (four) times daily. 60 mL Johnie Flaming A, NP   benzonatate  (TESSALON ) 100 MG capsule Take 1 capsule (100 mg total) by mouth every 8 (eight) hours. 21 capsule Johnie, Rubyann Lingle A, NP   predniSONE  (DELTASONE ) 20 MG tablet Take 2 tablets (40 mg total) by mouth daily for 5 days. 10 tablet Johnie Flaming A, NP   albuterol  (VENTOLIN  HFA) 108 (90 Base) MCG/ACT inhaler Inhale 1-2 puffs into the lungs every 6 (six) hours as needed for wheezing or shortness of breath. 8.5 g Johnie Flaming A, NP      PDMP not reviewed this encounter.    [1]  Social History Tobacco Use   Smoking status: Never   Smokeless tobacco: Never  Vaping Use   Vaping status: Never Used  Substance Use Topics   Alcohol use: No   Drug use: No     Johnie Flaming LABOR, NP 07/23/24 1410  "

## 2024-07-23 NOTE — ED Triage Notes (Signed)
 Wants a refill for Nystatin .  Was seen on 1/23 for a salty taste in mouth and mouth feeling sore off and on for a month.  States Nystatin  did help her  problem

## 2024-07-23 NOTE — Discharge Instructions (Signed)
 Your chest x-ray did not reveal any evidence of pneumonia.  I suspect your cough and wheezing could be related to a mild bronchitis.   Start taking 2 tablets of prednisone  once daily for 5 days to help with inflammation related to this. You can take Tessalon  every 8 hours as needed for cough. You can use albuterol  inhaler every 6 hours as needed 1 to 2 puffs for shortness of breath, wheezing, or cough.  Swish and spit nystatin  suspension 4 times daily to help with oral thrush. Follow-up with your primary care provider or return here as needed.

## 2024-08-21 ENCOUNTER — Inpatient Hospital Stay: Attending: Gynecologic Oncology

## 2024-08-28 ENCOUNTER — Inpatient Hospital Stay: Admitting: Oncology

## 2024-12-25 ENCOUNTER — Inpatient Hospital Stay: Admitting: Gynecologic Oncology

## 2025-01-05 ENCOUNTER — Inpatient Hospital Stay: Admitting: Gynecologic Oncology

## 2025-01-15 ENCOUNTER — Other Ambulatory Visit

## 2025-01-22 ENCOUNTER — Ambulatory Visit: Admitting: Oncology
